# Patient Record
Sex: Male | Born: 1940 | Race: White | Hispanic: No | Marital: Married | Smoking: Former smoker
Health system: Southern US, Community
[De-identification: ages and names within clinical notes are randomized; demographics above are authoritative.]

## PROBLEM LIST (undated history)

## (undated) DIAGNOSIS — M1990 Unspecified osteoarthritis, unspecified site: Secondary | ICD-10-CM

## (undated) DIAGNOSIS — D496 Neoplasm of unspecified behavior of brain: Secondary | ICD-10-CM

## (undated) DIAGNOSIS — Z860100 Personal history of colon polyps, unspecified: Secondary | ICD-10-CM

## (undated) DIAGNOSIS — K449 Diaphragmatic hernia without obstruction or gangrene: Secondary | ICD-10-CM

## (undated) DIAGNOSIS — I639 Cerebral infarction, unspecified: Secondary | ICD-10-CM

## (undated) DIAGNOSIS — G20A1 Parkinson's disease without dyskinesia, without mention of fluctuations: Secondary | ICD-10-CM

## (undated) DIAGNOSIS — I4891 Unspecified atrial fibrillation: Secondary | ICD-10-CM

## (undated) DIAGNOSIS — G20 Parkinson's disease: Secondary | ICD-10-CM

## (undated) DIAGNOSIS — F419 Anxiety disorder, unspecified: Secondary | ICD-10-CM

## (undated) DIAGNOSIS — D329 Benign neoplasm of meninges, unspecified: Secondary | ICD-10-CM

## (undated) DIAGNOSIS — I82409 Acute embolism and thrombosis of unspecified deep veins of unspecified lower extremity: Secondary | ICD-10-CM

## (undated) DIAGNOSIS — F329 Major depressive disorder, single episode, unspecified: Secondary | ICD-10-CM

## (undated) DIAGNOSIS — I809 Phlebitis and thrombophlebitis of unspecified site: Secondary | ICD-10-CM

## (undated) DIAGNOSIS — K219 Gastro-esophageal reflux disease without esophagitis: Secondary | ICD-10-CM

## (undated) DIAGNOSIS — A630 Anogenital (venereal) warts: Secondary | ICD-10-CM

## (undated) DIAGNOSIS — J690 Pneumonitis due to inhalation of food and vomit: Secondary | ICD-10-CM

## (undated) DIAGNOSIS — I2699 Other pulmonary embolism without acute cor pulmonale: Secondary | ICD-10-CM

## (undated) DIAGNOSIS — I2510 Atherosclerotic heart disease of native coronary artery without angina pectoris: Secondary | ICD-10-CM

## (undated) DIAGNOSIS — N400 Enlarged prostate without lower urinary tract symptoms: Secondary | ICD-10-CM

## (undated) DIAGNOSIS — I340 Nonrheumatic mitral (valve) insufficiency: Secondary | ICD-10-CM

## (undated) DIAGNOSIS — I513 Intracardiac thrombosis, not elsewhere classified: Secondary | ICD-10-CM

## (undated) DIAGNOSIS — K222 Esophageal obstruction: Secondary | ICD-10-CM

## (undated) DIAGNOSIS — E785 Hyperlipidemia, unspecified: Secondary | ICD-10-CM

## (undated) DIAGNOSIS — F0390 Unspecified dementia without behavioral disturbance: Secondary | ICD-10-CM

## (undated) DIAGNOSIS — K5730 Diverticulosis of large intestine without perforation or abscess without bleeding: Secondary | ICD-10-CM

## (undated) DIAGNOSIS — F32A Depression, unspecified: Secondary | ICD-10-CM

## (undated) HISTORY — DX: Benign prostatic hyperplasia without lower urinary tract symptoms: N40.0

## (undated) HISTORY — DX: Esophageal obstruction: K22.2

## (undated) HISTORY — PX: VARICOSE VEIN SURGERY: SHX832

## (undated) HISTORY — DX: Unspecified dementia, unspecified severity, without behavioral disturbance, psychotic disturbance, mood disturbance, and anxiety: F03.90

## (undated) HISTORY — DX: Parkinson's disease: G20

## (undated) HISTORY — DX: Diverticulosis of large intestine without perforation or abscess without bleeding: K57.30

## (undated) HISTORY — DX: Phlebitis and thrombophlebitis of unspecified site: I80.9

## (undated) HISTORY — DX: Personal history of colon polyps, unspecified: Z86.0100

## (undated) HISTORY — DX: Diaphragmatic hernia without obstruction or gangrene: K44.9

## (undated) HISTORY — DX: Parkinson's disease without dyskinesia, without mention of fluctuations: G20.A1

## (undated) HISTORY — DX: Personal history of colonic polyps: Z86.010

## (undated) HISTORY — DX: Unspecified atrial fibrillation: I48.91

## (undated) HISTORY — DX: Depression, unspecified: F32.A

## (undated) HISTORY — DX: Acute embolism and thrombosis of unspecified deep veins of unspecified lower extremity: I82.409

## (undated) HISTORY — DX: Anogenital (venereal) warts: A63.0

## (undated) HISTORY — PX: UMBILICAL HERNIA REPAIR: SHX196

## (undated) HISTORY — DX: Benign neoplasm of meninges, unspecified: D32.9

## (undated) HISTORY — PX: BACK SURGERY: SHX140

## (undated) HISTORY — DX: Atherosclerotic heart disease of native coronary artery without angina pectoris: I25.10

## (undated) HISTORY — PX: LUMBAR DISC SURGERY: SHX700

## (undated) HISTORY — PX: MASTOIDECTOMY: SHX711

## (undated) HISTORY — PX: TONSILLECTOMY AND ADENOIDECTOMY: SUR1326

## (undated) HISTORY — DX: Other pulmonary embolism without acute cor pulmonale: I26.99

## (undated) HISTORY — DX: Gastro-esophageal reflux disease without esophagitis: K21.9

## (undated) HISTORY — DX: Major depressive disorder, single episode, unspecified: F32.9

## (undated) HISTORY — DX: Hyperlipidemia, unspecified: E78.5

## (undated) HISTORY — PX: JOINT REPLACEMENT: SHX530

## (undated) HISTORY — PX: INGUINAL HERNIA REPAIR: SUR1180

---

## 1974-10-12 ENCOUNTER — Encounter: Payer: Self-pay | Admitting: Cardiology

## 1974-10-13 ENCOUNTER — Encounter: Payer: Self-pay | Admitting: Cardiology

## 1997-06-18 ENCOUNTER — Ambulatory Visit (HOSPITAL_COMMUNITY): Admission: RE | Admit: 1997-06-18 | Discharge: 1997-06-18 | Payer: Self-pay | Admitting: Neurosurgery

## 1997-06-28 ENCOUNTER — Inpatient Hospital Stay (HOSPITAL_COMMUNITY): Admission: RE | Admit: 1997-06-28 | Discharge: 1997-06-29 | Payer: Self-pay | Admitting: Neurosurgery

## 1997-11-07 ENCOUNTER — Encounter: Payer: Self-pay | Admitting: Internal Medicine

## 1997-11-07 ENCOUNTER — Ambulatory Visit (HOSPITAL_COMMUNITY): Admission: RE | Admit: 1997-11-07 | Discharge: 1997-11-07 | Payer: Self-pay | Admitting: Internal Medicine

## 1998-01-31 ENCOUNTER — Other Ambulatory Visit: Admission: RE | Admit: 1998-01-31 | Discharge: 1998-01-31 | Payer: Self-pay | Admitting: Gastroenterology

## 1998-11-01 ENCOUNTER — Inpatient Hospital Stay (HOSPITAL_COMMUNITY): Admission: EM | Admit: 1998-11-01 | Discharge: 1998-11-03 | Payer: Self-pay | Admitting: *Deleted

## 1998-11-01 ENCOUNTER — Encounter: Payer: Self-pay | Admitting: *Deleted

## 1998-11-02 ENCOUNTER — Encounter: Payer: Self-pay | Admitting: Cardiology

## 1999-02-09 ENCOUNTER — Encounter: Payer: Self-pay | Admitting: Family Medicine

## 1999-02-09 ENCOUNTER — Encounter: Admission: RE | Admit: 1999-02-09 | Discharge: 1999-02-09 | Payer: Self-pay | Admitting: Family Medicine

## 1999-03-14 ENCOUNTER — Ambulatory Visit (HOSPITAL_COMMUNITY): Admission: RE | Admit: 1999-03-14 | Discharge: 1999-03-14 | Payer: Self-pay | Admitting: Gastroenterology

## 1999-03-14 ENCOUNTER — Encounter: Payer: Self-pay | Admitting: Gastroenterology

## 1999-07-24 ENCOUNTER — Encounter: Payer: Self-pay | Admitting: Emergency Medicine

## 1999-07-24 ENCOUNTER — Observation Stay (HOSPITAL_COMMUNITY): Admission: EM | Admit: 1999-07-24 | Discharge: 1999-07-24 | Payer: Self-pay | Admitting: Emergency Medicine

## 1999-07-31 ENCOUNTER — Inpatient Hospital Stay (HOSPITAL_COMMUNITY): Admission: EM | Admit: 1999-07-31 | Discharge: 1999-08-04 | Payer: Self-pay | Admitting: Emergency Medicine

## 1999-08-24 ENCOUNTER — Ambulatory Visit (HOSPITAL_BASED_OUTPATIENT_CLINIC_OR_DEPARTMENT_OTHER): Admission: RE | Admit: 1999-08-24 | Discharge: 1999-08-24 | Payer: Self-pay | Admitting: *Deleted

## 1999-09-02 ENCOUNTER — Encounter: Admission: RE | Admit: 1999-09-02 | Discharge: 1999-09-02 | Payer: Self-pay | Admitting: Family Medicine

## 1999-09-02 ENCOUNTER — Encounter: Payer: Self-pay | Admitting: Family Medicine

## 1999-11-17 ENCOUNTER — Other Ambulatory Visit: Admission: RE | Admit: 1999-11-17 | Discharge: 1999-11-17 | Payer: Self-pay | Admitting: Urology

## 1999-11-17 ENCOUNTER — Encounter (INDEPENDENT_AMBULATORY_CARE_PROVIDER_SITE_OTHER): Payer: Self-pay | Admitting: Specialist

## 2000-03-17 ENCOUNTER — Observation Stay (HOSPITAL_COMMUNITY): Admission: EM | Admit: 2000-03-17 | Discharge: 2000-03-18 | Payer: Self-pay | Admitting: *Deleted

## 2001-08-26 ENCOUNTER — Encounter: Payer: Self-pay | Admitting: Emergency Medicine

## 2001-08-26 ENCOUNTER — Emergency Department (HOSPITAL_COMMUNITY): Admission: EM | Admit: 2001-08-26 | Discharge: 2001-08-26 | Payer: Self-pay | Admitting: Emergency Medicine

## 2001-10-03 ENCOUNTER — Encounter: Payer: Self-pay | Admitting: Internal Medicine

## 2001-10-03 ENCOUNTER — Ambulatory Visit (HOSPITAL_COMMUNITY): Admission: RE | Admit: 2001-10-03 | Discharge: 2001-10-03 | Payer: Self-pay | Admitting: Internal Medicine

## 2001-10-29 ENCOUNTER — Inpatient Hospital Stay (HOSPITAL_COMMUNITY): Admission: EM | Admit: 2001-10-29 | Discharge: 2001-11-01 | Payer: Self-pay

## 2001-10-31 ENCOUNTER — Encounter: Payer: Self-pay | Admitting: Surgery

## 2001-12-07 ENCOUNTER — Encounter: Payer: Self-pay | Admitting: Family Medicine

## 2002-02-16 ENCOUNTER — Encounter: Payer: Self-pay | Admitting: Internal Medicine

## 2002-02-16 ENCOUNTER — Encounter: Admission: RE | Admit: 2002-02-16 | Discharge: 2002-02-16 | Payer: Self-pay | Admitting: Internal Medicine

## 2002-03-18 ENCOUNTER — Encounter: Payer: Self-pay | Admitting: Emergency Medicine

## 2002-03-18 ENCOUNTER — Emergency Department (HOSPITAL_COMMUNITY): Admission: EM | Admit: 2002-03-18 | Discharge: 2002-03-18 | Payer: Self-pay | Admitting: Emergency Medicine

## 2002-05-30 ENCOUNTER — Encounter: Admission: RE | Admit: 2002-05-30 | Discharge: 2002-05-30 | Payer: Self-pay | Admitting: Family Medicine

## 2002-05-30 ENCOUNTER — Encounter: Payer: Self-pay | Admitting: Family Medicine

## 2002-06-15 ENCOUNTER — Ambulatory Visit (HOSPITAL_BASED_OUTPATIENT_CLINIC_OR_DEPARTMENT_OTHER): Admission: RE | Admit: 2002-06-15 | Discharge: 2002-06-15 | Payer: Self-pay | Admitting: Family Medicine

## 2002-08-28 ENCOUNTER — Observation Stay (HOSPITAL_COMMUNITY): Admission: EM | Admit: 2002-08-28 | Discharge: 2002-08-29 | Payer: Self-pay | Admitting: Emergency Medicine

## 2002-08-28 ENCOUNTER — Encounter: Payer: Self-pay | Admitting: Emergency Medicine

## 2002-08-29 ENCOUNTER — Encounter: Payer: Self-pay | Admitting: Cardiology

## 2002-12-28 ENCOUNTER — Emergency Department (HOSPITAL_COMMUNITY): Admission: EM | Admit: 2002-12-28 | Discharge: 2002-12-28 | Payer: Self-pay | Admitting: Emergency Medicine

## 2003-03-15 ENCOUNTER — Ambulatory Visit (HOSPITAL_BASED_OUTPATIENT_CLINIC_OR_DEPARTMENT_OTHER): Admission: RE | Admit: 2003-03-15 | Discharge: 2003-03-15 | Payer: Self-pay | Admitting: Otolaryngology

## 2003-04-03 ENCOUNTER — Encounter: Admission: RE | Admit: 2003-04-03 | Discharge: 2003-04-18 | Payer: Self-pay | Admitting: Family Medicine

## 2003-05-24 ENCOUNTER — Ambulatory Visit (HOSPITAL_COMMUNITY): Admission: RE | Admit: 2003-05-24 | Discharge: 2003-05-24 | Payer: Self-pay | Admitting: Gastroenterology

## 2004-01-01 ENCOUNTER — Emergency Department (HOSPITAL_COMMUNITY): Admission: EM | Admit: 2004-01-01 | Discharge: 2004-01-02 | Payer: Self-pay | Admitting: Emergency Medicine

## 2004-01-07 ENCOUNTER — Ambulatory Visit: Payer: Self-pay | Admitting: Gastroenterology

## 2004-01-08 ENCOUNTER — Ambulatory Visit: Payer: Self-pay | Admitting: Gastroenterology

## 2004-01-09 ENCOUNTER — Ambulatory Visit: Payer: Self-pay | Admitting: Family Medicine

## 2004-02-13 ENCOUNTER — Ambulatory Visit: Payer: Self-pay | Admitting: Family Medicine

## 2004-03-02 ENCOUNTER — Ambulatory Visit: Payer: Self-pay | Admitting: Family Medicine

## 2004-05-04 ENCOUNTER — Ambulatory Visit: Payer: Self-pay | Admitting: Family Medicine

## 2004-07-03 ENCOUNTER — Ambulatory Visit: Payer: Self-pay | Admitting: Family Medicine

## 2004-07-14 ENCOUNTER — Ambulatory Visit: Payer: Self-pay | Admitting: Family Medicine

## 2004-08-03 ENCOUNTER — Ambulatory Visit: Payer: Self-pay | Admitting: Family Medicine

## 2004-08-23 ENCOUNTER — Emergency Department (HOSPITAL_COMMUNITY): Admission: EM | Admit: 2004-08-23 | Discharge: 2004-08-24 | Payer: Self-pay | Admitting: Emergency Medicine

## 2004-09-04 ENCOUNTER — Ambulatory Visit: Payer: Self-pay | Admitting: Family Medicine

## 2004-11-17 ENCOUNTER — Ambulatory Visit: Payer: Self-pay | Admitting: Family Medicine

## 2004-11-24 ENCOUNTER — Emergency Department (HOSPITAL_COMMUNITY): Admission: EM | Admit: 2004-11-24 | Discharge: 2004-11-24 | Payer: Self-pay | Admitting: Emergency Medicine

## 2004-12-07 ENCOUNTER — Ambulatory Visit: Payer: Self-pay | Admitting: Family Medicine

## 2005-05-06 ENCOUNTER — Ambulatory Visit: Payer: Self-pay | Admitting: Family Medicine

## 2005-07-07 ENCOUNTER — Ambulatory Visit: Payer: Self-pay | Admitting: Family Medicine

## 2005-09-06 ENCOUNTER — Ambulatory Visit: Payer: Self-pay | Admitting: Family Medicine

## 2005-10-19 ENCOUNTER — Ambulatory Visit: Payer: Self-pay | Admitting: Family Medicine

## 2005-11-17 ENCOUNTER — Emergency Department (HOSPITAL_COMMUNITY): Admission: EM | Admit: 2005-11-17 | Discharge: 2005-11-17 | Payer: Self-pay | Admitting: Hematology and Oncology

## 2005-11-17 ENCOUNTER — Ambulatory Visit: Payer: Self-pay | Admitting: *Deleted

## 2005-11-17 ENCOUNTER — Inpatient Hospital Stay (HOSPITAL_COMMUNITY): Admission: AD | Admit: 2005-11-17 | Discharge: 2005-11-22 | Payer: Self-pay | Admitting: *Deleted

## 2005-12-30 ENCOUNTER — Ambulatory Visit: Payer: Self-pay | Admitting: Family Medicine

## 2006-01-04 ENCOUNTER — Emergency Department (HOSPITAL_COMMUNITY): Admission: EM | Admit: 2006-01-04 | Discharge: 2006-01-04 | Payer: Self-pay | Admitting: Emergency Medicine

## 2006-11-16 DIAGNOSIS — I10 Essential (primary) hypertension: Secondary | ICD-10-CM | POA: Insufficient documentation

## 2006-11-16 DIAGNOSIS — K219 Gastro-esophageal reflux disease without esophagitis: Secondary | ICD-10-CM

## 2007-01-16 ENCOUNTER — Telehealth: Payer: Self-pay | Admitting: Family Medicine

## 2007-01-17 ENCOUNTER — Telehealth: Payer: Self-pay | Admitting: Family Medicine

## 2007-01-23 DIAGNOSIS — I82409 Acute embolism and thrombosis of unspecified deep veins of unspecified lower extremity: Secondary | ICD-10-CM

## 2007-01-23 DIAGNOSIS — I2699 Other pulmonary embolism without acute cor pulmonale: Secondary | ICD-10-CM

## 2007-01-23 HISTORY — DX: Acute embolism and thrombosis of unspecified deep veins of unspecified lower extremity: I82.409

## 2007-01-23 HISTORY — DX: Other pulmonary embolism without acute cor pulmonale: I26.99

## 2007-02-02 ENCOUNTER — Encounter: Payer: Self-pay | Admitting: Family Medicine

## 2007-02-13 ENCOUNTER — Telehealth: Payer: Self-pay | Admitting: Family Medicine

## 2007-02-21 ENCOUNTER — Encounter: Payer: Self-pay | Admitting: Family Medicine

## 2007-02-22 ENCOUNTER — Ambulatory Visit: Payer: Self-pay | Admitting: Cardiovascular Disease

## 2007-02-22 ENCOUNTER — Inpatient Hospital Stay (HOSPITAL_COMMUNITY): Admission: AD | Admit: 2007-02-22 | Discharge: 2007-02-28 | Payer: Self-pay | Admitting: Family Medicine

## 2007-02-22 ENCOUNTER — Ambulatory Visit: Payer: Self-pay | Admitting: Family Medicine

## 2007-02-22 ENCOUNTER — Ambulatory Visit: Payer: Self-pay

## 2007-02-22 ENCOUNTER — Encounter: Payer: Self-pay | Admitting: Family Medicine

## 2007-02-22 DIAGNOSIS — I809 Phlebitis and thrombophlebitis of unspecified site: Secondary | ICD-10-CM | POA: Insufficient documentation

## 2007-02-22 DIAGNOSIS — F068 Other specified mental disorders due to known physiological condition: Secondary | ICD-10-CM

## 2007-02-24 ENCOUNTER — Encounter: Payer: Self-pay | Admitting: Family Medicine

## 2007-02-24 ENCOUNTER — Encounter (INDEPENDENT_AMBULATORY_CARE_PROVIDER_SITE_OTHER): Payer: Self-pay | Admitting: Interventional Radiology

## 2007-02-24 ENCOUNTER — Encounter: Payer: Self-pay | Admitting: Internal Medicine

## 2007-03-01 ENCOUNTER — Encounter: Payer: Self-pay | Admitting: Family Medicine

## 2007-03-13 ENCOUNTER — Ambulatory Visit: Payer: Self-pay | Admitting: Hematology and Oncology

## 2007-03-13 ENCOUNTER — Ambulatory Visit: Payer: Self-pay | Admitting: Family Medicine

## 2007-03-13 DIAGNOSIS — Z86718 Personal history of other venous thrombosis and embolism: Secondary | ICD-10-CM

## 2007-03-13 DIAGNOSIS — A630 Anogenital (venereal) warts: Secondary | ICD-10-CM | POA: Insufficient documentation

## 2007-03-13 DIAGNOSIS — I82409 Acute embolism and thrombosis of unspecified deep veins of unspecified lower extremity: Secondary | ICD-10-CM | POA: Insufficient documentation

## 2007-03-13 DIAGNOSIS — I2699 Other pulmonary embolism without acute cor pulmonale: Secondary | ICD-10-CM

## 2007-03-13 DIAGNOSIS — J45909 Unspecified asthma, uncomplicated: Secondary | ICD-10-CM | POA: Insufficient documentation

## 2007-03-13 DIAGNOSIS — D320 Benign neoplasm of cerebral meninges: Secondary | ICD-10-CM

## 2007-03-13 DIAGNOSIS — Z860100 Personal history of colon polyps, unspecified: Secondary | ICD-10-CM | POA: Insufficient documentation

## 2007-03-13 DIAGNOSIS — J309 Allergic rhinitis, unspecified: Secondary | ICD-10-CM | POA: Insufficient documentation

## 2007-03-13 DIAGNOSIS — G20 Parkinson's disease: Secondary | ICD-10-CM

## 2007-03-13 DIAGNOSIS — Z9189 Other specified personal risk factors, not elsewhere classified: Secondary | ICD-10-CM | POA: Insufficient documentation

## 2007-03-13 DIAGNOSIS — Z87898 Personal history of other specified conditions: Secondary | ICD-10-CM | POA: Insufficient documentation

## 2007-03-13 DIAGNOSIS — F329 Major depressive disorder, single episode, unspecified: Secondary | ICD-10-CM | POA: Insufficient documentation

## 2007-03-14 ENCOUNTER — Encounter: Payer: Self-pay | Admitting: Family Medicine

## 2007-03-20 ENCOUNTER — Telehealth: Payer: Self-pay | Admitting: Internal Medicine

## 2007-03-24 ENCOUNTER — Encounter: Payer: Self-pay | Admitting: Family Medicine

## 2007-03-24 LAB — CBC WITH DIFFERENTIAL/PLATELET
Basophils Absolute: 0.1 10*3/uL (ref 0.0–0.1)
EOS%: 8.1 % — ABNORMAL HIGH (ref 0.0–7.0)
Eosinophils Absolute: 0.5 10*3/uL (ref 0.0–0.5)
HGB: 13.5 g/dL (ref 13.0–17.1)
LYMPH%: 25.7 % (ref 14.0–48.0)
MCH: 30.7 pg (ref 28.0–33.4)
MCV: 89.2 fL (ref 81.6–98.0)
MONO%: 9.6 % (ref 0.0–13.0)
Platelets: 167 10*3/uL (ref 145–400)
RDW: 11.1 % — ABNORMAL LOW (ref 11.2–14.6)

## 2007-03-24 LAB — PROTIME-INR: INR: 2.4 (ref 2.00–3.50)

## 2007-03-30 LAB — CARDIOLIPIN ANTIBODIES, IGG, IGM, IGA
Anticardiolipin IgA: 9 [APL'U] (ref <13)
Anticardiolipin IgG: <7 [GPL'U] (ref <11)
Anticardiolipin IgM: 20 [MPL'U] (ref <10)

## 2007-03-30 LAB — COMPREHENSIVE METABOLIC PANEL
AST: 13 U/L (ref 0–37)
Albumin: 3.9 g/dL (ref 3.5–5.2)
Alkaline Phosphatase: 104 U/L (ref 39–117)
Glucose, Bld: 96 mg/dL (ref 70–99)
Potassium: 4.1 mEq/L (ref 3.5–5.3)
Sodium: 142 mEq/L (ref 135–145)
Total Bilirubin: 0.7 mg/dL (ref 0.3–1.2)
Total Protein: 6.7 g/dL (ref 6.0–8.3)

## 2007-03-30 LAB — LUPUS ANTICOAGULANT PANEL: DRVVT 1:1 Mix: 40 secs (ref 36.1–47.0)

## 2007-03-30 LAB — BETA-2 GLYCOPROTEIN ANTIBODIES
Beta-2 Glyco I IgG: <4 U/mL (ref <20)
Beta-2-Glycoprotein I IgM: 4 U/mL (ref <10)

## 2007-03-30 LAB — PROTHROMBIN GENE MUTATION

## 2007-03-30 LAB — MTHFR DNA ANALYSIS

## 2007-04-04 ENCOUNTER — Encounter: Payer: Self-pay | Admitting: Family Medicine

## 2007-04-06 ENCOUNTER — Encounter: Payer: Self-pay | Admitting: Family Medicine

## 2007-04-18 ENCOUNTER — Telehealth (INDEPENDENT_AMBULATORY_CARE_PROVIDER_SITE_OTHER): Payer: Self-pay | Admitting: *Deleted

## 2007-07-10 ENCOUNTER — Telehealth: Payer: Self-pay | Admitting: Family Medicine

## 2007-07-11 ENCOUNTER — Ambulatory Visit: Payer: Self-pay | Admitting: Hematology and Oncology

## 2007-07-13 ENCOUNTER — Encounter: Payer: Self-pay | Admitting: Family Medicine

## 2007-07-13 ENCOUNTER — Telehealth: Payer: Self-pay | Admitting: Family Medicine

## 2007-07-14 ENCOUNTER — Ambulatory Visit: Payer: Self-pay | Admitting: Surgery

## 2007-07-14 ENCOUNTER — Ambulatory Visit: Payer: Self-pay | Admitting: Family Medicine

## 2007-07-14 ENCOUNTER — Encounter: Payer: Self-pay | Admitting: Hematology and Oncology

## 2007-07-14 ENCOUNTER — Ambulatory Visit: Admission: RE | Admit: 2007-07-14 | Discharge: 2007-07-14 | Payer: Self-pay | Admitting: Hematology and Oncology

## 2007-07-26 ENCOUNTER — Encounter: Payer: Self-pay | Admitting: Family Medicine

## 2007-07-31 ENCOUNTER — Ambulatory Visit: Payer: Self-pay | Admitting: Family Medicine

## 2007-07-31 LAB — CONVERTED CEMR LAB: INR: 2.8

## 2007-08-28 ENCOUNTER — Ambulatory Visit: Payer: Self-pay | Admitting: Family Medicine

## 2007-08-28 LAB — CONVERTED CEMR LAB
INR: 3.2
Prothrombin Time: 21.6 s

## 2007-09-07 ENCOUNTER — Encounter: Payer: Self-pay | Admitting: Emergency Medicine

## 2007-09-07 ENCOUNTER — Ambulatory Visit: Payer: Self-pay | Admitting: Internal Medicine

## 2007-09-07 ENCOUNTER — Inpatient Hospital Stay (HOSPITAL_COMMUNITY): Admission: AD | Admit: 2007-09-07 | Discharge: 2007-09-08 | Payer: Self-pay | Admitting: Internal Medicine

## 2007-09-08 ENCOUNTER — Encounter: Payer: Self-pay | Admitting: Family Medicine

## 2007-09-08 ENCOUNTER — Encounter: Payer: Self-pay | Admitting: Internal Medicine

## 2007-09-18 ENCOUNTER — Emergency Department (HOSPITAL_COMMUNITY): Admission: EM | Admit: 2007-09-18 | Discharge: 2007-09-18 | Payer: Self-pay | Admitting: Emergency Medicine

## 2007-09-26 ENCOUNTER — Inpatient Hospital Stay (HOSPITAL_COMMUNITY): Admission: EM | Admit: 2007-09-26 | Discharge: 2007-09-27 | Payer: Self-pay | Admitting: Emergency Medicine

## 2007-09-27 ENCOUNTER — Encounter: Payer: Self-pay | Admitting: Family Medicine

## 2007-09-29 ENCOUNTER — Ambulatory Visit: Payer: Self-pay | Admitting: Family Medicine

## 2007-09-29 DIAGNOSIS — I4891 Unspecified atrial fibrillation: Secondary | ICD-10-CM

## 2007-10-27 ENCOUNTER — Ambulatory Visit: Payer: Self-pay | Admitting: Family Medicine

## 2007-10-27 LAB — CONVERTED CEMR LAB: INR: 1.7

## 2007-11-17 ENCOUNTER — Ambulatory Visit: Payer: Self-pay | Admitting: Family Medicine

## 2007-11-17 LAB — CONVERTED CEMR LAB: INR: 2

## 2007-11-21 ENCOUNTER — Encounter: Payer: Self-pay | Admitting: Family Medicine

## 2007-11-22 ENCOUNTER — Ambulatory Visit: Payer: Self-pay | Admitting: Cardiology

## 2008-03-25 DIAGNOSIS — J690 Pneumonitis due to inhalation of food and vomit: Secondary | ICD-10-CM

## 2008-03-25 HISTORY — DX: Pneumonitis due to inhalation of food and vomit: J69.0

## 2008-04-07 ENCOUNTER — Emergency Department (HOSPITAL_BASED_OUTPATIENT_CLINIC_OR_DEPARTMENT_OTHER): Admission: EM | Admit: 2008-04-07 | Discharge: 2008-04-07 | Payer: Self-pay | Admitting: Emergency Medicine

## 2008-04-08 ENCOUNTER — Ambulatory Visit: Payer: Self-pay | Admitting: Internal Medicine

## 2008-04-08 ENCOUNTER — Encounter: Payer: Self-pay | Admitting: Emergency Medicine

## 2008-04-08 ENCOUNTER — Inpatient Hospital Stay (HOSPITAL_COMMUNITY): Admission: AD | Admit: 2008-04-08 | Discharge: 2008-04-24 | Payer: Self-pay | Admitting: Internal Medicine

## 2008-04-08 ENCOUNTER — Ambulatory Visit: Payer: Self-pay | Admitting: Diagnostic Radiology

## 2008-04-18 ENCOUNTER — Telehealth: Payer: Self-pay | Admitting: Family Medicine

## 2008-04-22 ENCOUNTER — Telehealth: Payer: Self-pay | Admitting: Internal Medicine

## 2008-04-23 ENCOUNTER — Encounter: Payer: Self-pay | Admitting: Family Medicine

## 2008-04-24 ENCOUNTER — Telehealth: Payer: Self-pay | Admitting: Family Medicine

## 2008-04-25 ENCOUNTER — Telehealth: Payer: Self-pay | Admitting: Internal Medicine

## 2008-04-25 ENCOUNTER — Encounter: Payer: Self-pay | Admitting: Family Medicine

## 2008-04-30 ENCOUNTER — Telehealth: Payer: Self-pay | Admitting: Family Medicine

## 2008-05-06 ENCOUNTER — Encounter: Payer: Self-pay | Admitting: Family Medicine

## 2008-05-07 ENCOUNTER — Ambulatory Visit: Payer: Self-pay | Admitting: Internal Medicine

## 2008-05-07 ENCOUNTER — Telehealth: Payer: Self-pay | Admitting: Family Medicine

## 2008-05-07 ENCOUNTER — Inpatient Hospital Stay (HOSPITAL_COMMUNITY): Admission: EM | Admit: 2008-05-07 | Discharge: 2008-05-08 | Payer: Self-pay | Admitting: Emergency Medicine

## 2008-05-08 ENCOUNTER — Ambulatory Visit: Payer: Self-pay | Admitting: Family Medicine

## 2008-05-09 ENCOUNTER — Telehealth: Payer: Self-pay | Admitting: Family Medicine

## 2008-05-15 ENCOUNTER — Telehealth: Payer: Self-pay | Admitting: Family Medicine

## 2008-05-17 ENCOUNTER — Encounter: Payer: Self-pay | Admitting: Family Medicine

## 2008-05-21 ENCOUNTER — Telehealth: Payer: Self-pay | Admitting: Family Medicine

## 2008-05-22 ENCOUNTER — Ambulatory Visit: Payer: Self-pay | Admitting: Family Medicine

## 2008-05-28 ENCOUNTER — Telehealth: Payer: Self-pay | Admitting: Family Medicine

## 2008-06-05 ENCOUNTER — Encounter: Payer: Self-pay | Admitting: Family Medicine

## 2008-06-06 ENCOUNTER — Encounter: Payer: Self-pay | Admitting: Family Medicine

## 2008-06-10 ENCOUNTER — Telehealth: Payer: Self-pay | Admitting: Family Medicine

## 2008-06-10 ENCOUNTER — Telehealth (INDEPENDENT_AMBULATORY_CARE_PROVIDER_SITE_OTHER): Payer: Self-pay | Admitting: *Deleted

## 2008-06-11 ENCOUNTER — Telehealth (INDEPENDENT_AMBULATORY_CARE_PROVIDER_SITE_OTHER): Payer: Self-pay | Admitting: *Deleted

## 2008-06-11 ENCOUNTER — Telehealth: Payer: Self-pay | Admitting: Family Medicine

## 2008-06-12 ENCOUNTER — Encounter: Payer: Self-pay | Admitting: Internal Medicine

## 2008-06-12 ENCOUNTER — Ambulatory Visit: Payer: Self-pay | Admitting: Internal Medicine

## 2008-06-12 DIAGNOSIS — R1319 Other dysphagia: Secondary | ICD-10-CM

## 2008-06-13 ENCOUNTER — Telehealth: Payer: Self-pay | Admitting: Internal Medicine

## 2008-06-13 DIAGNOSIS — I5040 Unspecified combined systolic (congestive) and diastolic (congestive) heart failure: Secondary | ICD-10-CM | POA: Insufficient documentation

## 2008-06-14 ENCOUNTER — Encounter: Payer: Self-pay | Admitting: Internal Medicine

## 2008-06-17 ENCOUNTER — Telehealth: Payer: Self-pay | Admitting: Family Medicine

## 2008-06-18 ENCOUNTER — Encounter: Payer: Self-pay | Admitting: Internal Medicine

## 2008-06-18 ENCOUNTER — Telehealth: Payer: Self-pay | Admitting: Family Medicine

## 2008-06-20 ENCOUNTER — Encounter: Payer: Self-pay | Admitting: Family Medicine

## 2008-06-26 ENCOUNTER — Telehealth: Payer: Self-pay | Admitting: Family Medicine

## 2008-06-27 ENCOUNTER — Encounter: Payer: Self-pay | Admitting: Family Medicine

## 2008-07-03 ENCOUNTER — Encounter: Payer: Self-pay | Admitting: Family Medicine

## 2008-07-04 ENCOUNTER — Encounter: Payer: Self-pay | Admitting: Family Medicine

## 2008-07-15 ENCOUNTER — Ambulatory Visit: Payer: Self-pay | Admitting: Internal Medicine

## 2008-07-18 ENCOUNTER — Encounter: Payer: Self-pay | Admitting: Family Medicine

## 2008-07-24 ENCOUNTER — Telehealth: Payer: Self-pay | Admitting: Family Medicine

## 2008-07-25 ENCOUNTER — Telehealth (INDEPENDENT_AMBULATORY_CARE_PROVIDER_SITE_OTHER): Payer: Self-pay | Admitting: *Deleted

## 2008-07-31 ENCOUNTER — Telehealth: Payer: Self-pay | Admitting: Family Medicine

## 2008-08-21 ENCOUNTER — Telehealth (INDEPENDENT_AMBULATORY_CARE_PROVIDER_SITE_OTHER): Payer: Self-pay | Admitting: *Deleted

## 2008-08-21 ENCOUNTER — Ambulatory Visit: Payer: Self-pay | Admitting: Family Medicine

## 2008-08-23 ENCOUNTER — Encounter: Payer: Self-pay | Admitting: Internal Medicine

## 2008-09-02 ENCOUNTER — Telehealth: Payer: Self-pay | Admitting: Internal Medicine

## 2008-09-04 ENCOUNTER — Encounter: Payer: Self-pay | Admitting: Internal Medicine

## 2008-09-09 ENCOUNTER — Encounter: Payer: Self-pay | Admitting: Family Medicine

## 2008-09-13 ENCOUNTER — Telehealth: Payer: Self-pay | Admitting: Family Medicine

## 2008-09-18 ENCOUNTER — Encounter: Payer: Self-pay | Admitting: Family Medicine

## 2008-09-20 ENCOUNTER — Encounter: Payer: Self-pay | Admitting: Family Medicine

## 2008-10-01 ENCOUNTER — Encounter: Payer: Self-pay | Admitting: Family Medicine

## 2008-10-03 ENCOUNTER — Ambulatory Visit: Payer: Self-pay | Admitting: Internal Medicine

## 2008-10-04 ENCOUNTER — Telehealth: Payer: Self-pay | Admitting: Family Medicine

## 2008-10-07 ENCOUNTER — Encounter: Payer: Self-pay | Admitting: Family Medicine

## 2008-10-08 ENCOUNTER — Inpatient Hospital Stay (HOSPITAL_COMMUNITY): Admission: EM | Admit: 2008-10-08 | Discharge: 2008-10-14 | Payer: Self-pay | Admitting: Emergency Medicine

## 2008-10-08 ENCOUNTER — Ambulatory Visit: Payer: Self-pay | Admitting: Internal Medicine

## 2008-10-08 ENCOUNTER — Ambulatory Visit: Payer: Self-pay | Admitting: Cardiology

## 2008-10-09 ENCOUNTER — Telehealth: Payer: Self-pay | Admitting: Family Medicine

## 2008-10-10 ENCOUNTER — Encounter (INDEPENDENT_AMBULATORY_CARE_PROVIDER_SITE_OTHER): Payer: Self-pay | Admitting: Internal Medicine

## 2008-10-15 ENCOUNTER — Encounter: Payer: Self-pay | Admitting: Family Medicine

## 2008-10-16 ENCOUNTER — Telehealth: Payer: Self-pay | Admitting: Family Medicine

## 2008-10-17 ENCOUNTER — Encounter: Payer: Self-pay | Admitting: Family Medicine

## 2008-10-22 ENCOUNTER — Telehealth: Payer: Self-pay | Admitting: Internal Medicine

## 2008-10-29 ENCOUNTER — Ambulatory Visit: Payer: Self-pay | Admitting: Family Medicine

## 2008-10-29 ENCOUNTER — Encounter: Payer: Self-pay | Admitting: Internal Medicine

## 2008-10-29 DIAGNOSIS — N390 Urinary tract infection, site not specified: Secondary | ICD-10-CM | POA: Insufficient documentation

## 2008-10-31 ENCOUNTER — Encounter: Payer: Self-pay | Admitting: Family Medicine

## 2008-11-01 ENCOUNTER — Emergency Department (HOSPITAL_BASED_OUTPATIENT_CLINIC_OR_DEPARTMENT_OTHER): Admission: EM | Admit: 2008-11-01 | Discharge: 2008-11-01 | Payer: Self-pay | Admitting: Emergency Medicine

## 2008-11-01 ENCOUNTER — Telehealth: Payer: Self-pay | Admitting: Family Medicine

## 2008-11-01 ENCOUNTER — Ambulatory Visit: Payer: Self-pay | Admitting: Diagnostic Radiology

## 2008-11-05 ENCOUNTER — Ambulatory Visit: Payer: Self-pay | Admitting: Family Medicine

## 2008-11-07 ENCOUNTER — Telehealth: Payer: Self-pay | Admitting: Family Medicine

## 2008-11-08 ENCOUNTER — Telehealth: Payer: Self-pay | Admitting: Family Medicine

## 2008-11-11 ENCOUNTER — Telehealth: Payer: Self-pay | Admitting: Family Medicine

## 2008-11-14 ENCOUNTER — Encounter: Payer: Self-pay | Admitting: Family Medicine

## 2008-11-19 ENCOUNTER — Encounter: Payer: Self-pay | Admitting: Family Medicine

## 2008-11-21 ENCOUNTER — Encounter: Payer: Self-pay | Admitting: Family Medicine

## 2008-11-27 ENCOUNTER — Ambulatory Visit: Payer: Self-pay | Admitting: Family Medicine

## 2008-12-02 ENCOUNTER — Encounter: Payer: Self-pay | Admitting: Physician Assistant

## 2008-12-02 ENCOUNTER — Ambulatory Visit: Payer: Self-pay | Admitting: Internal Medicine

## 2008-12-02 ENCOUNTER — Telehealth: Payer: Self-pay | Admitting: Family Medicine

## 2008-12-02 DIAGNOSIS — I951 Orthostatic hypotension: Secondary | ICD-10-CM | POA: Insufficient documentation

## 2008-12-02 LAB — CONVERTED CEMR LAB
Digitoxin Lvl: 0.8 ng/mL (ref 0.8–2.0)
GFR calc non Af Amer: 58.45 mL/min (ref >60)
Glucose, Bld: 85 mg/dL (ref 70–99)
Potassium: 5 meq/L (ref 3.5–5.1)
Sodium: 140 meq/L (ref 135–145)

## 2008-12-04 ENCOUNTER — Encounter: Payer: Self-pay | Admitting: Family Medicine

## 2008-12-06 ENCOUNTER — Encounter: Payer: Self-pay | Admitting: Family Medicine

## 2009-01-02 ENCOUNTER — Telehealth: Payer: Self-pay | Admitting: Internal Medicine

## 2009-01-07 ENCOUNTER — Encounter: Payer: Self-pay | Admitting: Internal Medicine

## 2009-01-21 ENCOUNTER — Encounter (INDEPENDENT_AMBULATORY_CARE_PROVIDER_SITE_OTHER): Payer: Self-pay | Admitting: *Deleted

## 2009-01-24 ENCOUNTER — Encounter (INDEPENDENT_AMBULATORY_CARE_PROVIDER_SITE_OTHER): Payer: Self-pay | Admitting: *Deleted

## 2009-02-07 ENCOUNTER — Telehealth: Payer: Self-pay | Admitting: Family Medicine

## 2009-02-26 ENCOUNTER — Ambulatory Visit: Payer: Self-pay | Admitting: Family Medicine

## 2009-03-28 ENCOUNTER — Encounter: Payer: Self-pay | Admitting: Internal Medicine

## 2009-03-31 ENCOUNTER — Ambulatory Visit: Payer: Self-pay | Admitting: Internal Medicine

## 2009-04-22 ENCOUNTER — Telehealth: Payer: Self-pay | Admitting: Family Medicine

## 2009-06-06 ENCOUNTER — Encounter: Payer: Self-pay | Admitting: Internal Medicine

## 2009-06-14 ENCOUNTER — Emergency Department (HOSPITAL_COMMUNITY): Admission: EM | Admit: 2009-06-14 | Discharge: 2009-06-14 | Payer: Self-pay | Admitting: Emergency Medicine

## 2009-06-18 ENCOUNTER — Telehealth: Payer: Self-pay | Admitting: Internal Medicine

## 2009-06-28 ENCOUNTER — Emergency Department (HOSPITAL_BASED_OUTPATIENT_CLINIC_OR_DEPARTMENT_OTHER): Admission: EM | Admit: 2009-06-28 | Discharge: 2009-06-28 | Payer: Self-pay | Admitting: Emergency Medicine

## 2009-06-28 ENCOUNTER — Ambulatory Visit: Payer: Self-pay | Admitting: Diagnostic Radiology

## 2009-07-16 ENCOUNTER — Encounter: Payer: Self-pay | Admitting: Family Medicine

## 2009-08-22 ENCOUNTER — Ambulatory Visit: Payer: Self-pay | Admitting: Family Medicine

## 2009-08-22 HISTORY — PX: MITRAL VALVE REPAIR: SHX2039

## 2009-08-22 HISTORY — PX: CORONARY ARTERY BYPASS GRAFT: SHX141

## 2009-08-25 ENCOUNTER — Emergency Department (HOSPITAL_COMMUNITY): Admission: EM | Admit: 2009-08-25 | Discharge: 2009-08-25 | Payer: Self-pay | Admitting: Emergency Medicine

## 2009-08-26 ENCOUNTER — Telehealth: Payer: Self-pay | Admitting: Internal Medicine

## 2009-08-27 ENCOUNTER — Inpatient Hospital Stay (HOSPITAL_COMMUNITY): Admission: EM | Admit: 2009-08-27 | Discharge: 2009-09-17 | Payer: Self-pay | Admitting: Emergency Medicine

## 2009-08-27 ENCOUNTER — Telehealth: Payer: Self-pay

## 2009-08-27 ENCOUNTER — Ambulatory Visit: Payer: Self-pay | Admitting: Cardiovascular Disease

## 2009-08-29 ENCOUNTER — Ambulatory Visit: Payer: Self-pay | Admitting: Cardiothoracic Surgery

## 2009-08-31 ENCOUNTER — Encounter: Payer: Self-pay | Admitting: Cardiothoracic Surgery

## 2009-09-01 ENCOUNTER — Encounter: Payer: Self-pay | Admitting: Internal Medicine

## 2009-09-01 ENCOUNTER — Encounter: Payer: Self-pay | Admitting: Cardiothoracic Surgery

## 2009-09-02 ENCOUNTER — Encounter: Payer: Self-pay | Admitting: Internal Medicine

## 2009-09-03 ENCOUNTER — Encounter: Payer: Self-pay | Admitting: Internal Medicine

## 2009-09-04 ENCOUNTER — Encounter: Payer: Self-pay | Admitting: Internal Medicine

## 2009-09-05 ENCOUNTER — Encounter: Payer: Self-pay | Admitting: Cardiothoracic Surgery

## 2009-09-15 ENCOUNTER — Encounter: Payer: Self-pay | Admitting: Internal Medicine

## 2009-09-22 ENCOUNTER — Ambulatory Visit: Payer: Self-pay | Admitting: Cardiothoracic Surgery

## 2009-09-22 ENCOUNTER — Encounter: Admission: RE | Admit: 2009-09-22 | Discharge: 2009-09-22 | Payer: Self-pay | Admitting: Cardiothoracic Surgery

## 2009-09-22 ENCOUNTER — Telehealth: Payer: Self-pay | Admitting: Internal Medicine

## 2009-09-22 ENCOUNTER — Encounter: Payer: Self-pay | Admitting: Cardiology

## 2009-09-25 ENCOUNTER — Ambulatory Visit: Payer: Self-pay | Admitting: Internal Medicine

## 2009-09-25 DIAGNOSIS — I2510 Atherosclerotic heart disease of native coronary artery without angina pectoris: Secondary | ICD-10-CM | POA: Insufficient documentation

## 2009-09-26 ENCOUNTER — Encounter: Admission: RE | Admit: 2009-09-26 | Discharge: 2009-09-26 | Payer: Self-pay | Admitting: Cardiothoracic Surgery

## 2009-09-26 ENCOUNTER — Encounter: Payer: Self-pay | Admitting: Cardiothoracic Surgery

## 2009-09-26 ENCOUNTER — Ambulatory Visit: Payer: Self-pay | Admitting: Cardiothoracic Surgery

## 2009-09-26 ENCOUNTER — Ambulatory Visit: Admission: RE | Admit: 2009-09-26 | Discharge: 2009-09-26 | Payer: Self-pay | Admitting: Cardiothoracic Surgery

## 2009-09-26 ENCOUNTER — Ambulatory Visit: Payer: Self-pay | Admitting: Vascular Surgery

## 2009-09-27 ENCOUNTER — Emergency Department (HOSPITAL_COMMUNITY): Admission: EM | Admit: 2009-09-27 | Discharge: 2009-09-27 | Payer: Self-pay | Admitting: Emergency Medicine

## 2009-09-29 ENCOUNTER — Encounter: Payer: Self-pay | Admitting: Family Medicine

## 2009-10-03 ENCOUNTER — Ambulatory Visit: Payer: Self-pay | Admitting: Family Medicine

## 2009-10-08 ENCOUNTER — Encounter: Admission: RE | Admit: 2009-10-08 | Discharge: 2009-10-08 | Payer: Self-pay | Admitting: Cardiothoracic Surgery

## 2009-10-08 ENCOUNTER — Ambulatory Visit: Payer: Self-pay | Admitting: Cardiothoracic Surgery

## 2009-10-08 ENCOUNTER — Encounter: Payer: Self-pay | Admitting: Family Medicine

## 2009-10-10 ENCOUNTER — Ambulatory Visit: Payer: Self-pay | Admitting: Cardiothoracic Surgery

## 2009-10-11 ENCOUNTER — Ambulatory Visit: Payer: Self-pay | Admitting: Cardiothoracic Surgery

## 2009-10-11 ENCOUNTER — Inpatient Hospital Stay (HOSPITAL_COMMUNITY): Admission: EM | Admit: 2009-10-11 | Discharge: 2009-10-21 | Payer: Self-pay | Admitting: Cardiothoracic Surgery

## 2009-10-13 ENCOUNTER — Encounter: Payer: Self-pay | Admitting: Family Medicine

## 2009-10-15 ENCOUNTER — Encounter: Payer: Self-pay | Admitting: Family Medicine

## 2009-10-16 ENCOUNTER — Encounter: Payer: Self-pay | Admitting: Family Medicine

## 2009-10-20 ENCOUNTER — Encounter: Payer: Self-pay | Admitting: Family Medicine

## 2009-10-29 ENCOUNTER — Ambulatory Visit: Payer: Self-pay | Admitting: Cardiothoracic Surgery

## 2009-11-03 ENCOUNTER — Ambulatory Visit: Payer: Self-pay | Admitting: Cardiology

## 2009-11-03 DIAGNOSIS — Z9889 Other specified postprocedural states: Secondary | ICD-10-CM

## 2009-11-05 ENCOUNTER — Encounter: Payer: Self-pay | Admitting: Cardiology

## 2009-11-05 ENCOUNTER — Ambulatory Visit: Payer: Self-pay | Admitting: Cardiothoracic Surgery

## 2009-11-06 ENCOUNTER — Encounter: Payer: Self-pay | Admitting: Family Medicine

## 2009-11-11 ENCOUNTER — Telehealth: Payer: Self-pay | Admitting: Family Medicine

## 2009-11-12 ENCOUNTER — Encounter: Payer: Self-pay | Admitting: Family Medicine

## 2009-11-17 ENCOUNTER — Encounter: Payer: Self-pay | Admitting: Family Medicine

## 2009-11-19 ENCOUNTER — Encounter: Payer: Self-pay | Admitting: Family Medicine

## 2009-11-21 ENCOUNTER — Encounter: Payer: Self-pay | Admitting: Internal Medicine

## 2009-11-21 ENCOUNTER — Ambulatory Visit: Payer: Self-pay | Admitting: Cardiothoracic Surgery

## 2009-11-25 ENCOUNTER — Ambulatory Visit: Payer: Self-pay

## 2009-11-25 ENCOUNTER — Encounter: Payer: Self-pay | Admitting: Cardiothoracic Surgery

## 2009-11-25 ENCOUNTER — Encounter (INDEPENDENT_AMBULATORY_CARE_PROVIDER_SITE_OTHER): Payer: Self-pay | Admitting: *Deleted

## 2009-11-25 ENCOUNTER — Ambulatory Visit: Payer: Self-pay | Admitting: Cardiology

## 2009-11-25 ENCOUNTER — Ambulatory Visit (HOSPITAL_COMMUNITY): Admission: RE | Admit: 2009-11-25 | Discharge: 2009-11-25 | Payer: Self-pay | Admitting: Cardiothoracic Surgery

## 2009-11-26 ENCOUNTER — Encounter: Payer: Self-pay | Admitting: Family Medicine

## 2009-12-05 ENCOUNTER — Encounter: Payer: Self-pay | Admitting: Family Medicine

## 2009-12-16 ENCOUNTER — Encounter: Payer: Self-pay | Admitting: Family Medicine

## 2009-12-24 ENCOUNTER — Encounter: Payer: Self-pay | Admitting: Family Medicine

## 2010-01-20 ENCOUNTER — Telehealth: Payer: Self-pay | Admitting: Family Medicine

## 2010-01-28 ENCOUNTER — Ambulatory Visit: Payer: Self-pay | Admitting: Cardiothoracic Surgery

## 2010-01-30 ENCOUNTER — Telehealth: Payer: Self-pay | Admitting: Family Medicine

## 2010-02-03 ENCOUNTER — Telehealth: Payer: Self-pay | Admitting: Cardiology

## 2010-02-03 ENCOUNTER — Encounter: Payer: Self-pay | Admitting: Cardiology

## 2010-02-04 ENCOUNTER — Encounter: Payer: Self-pay | Admitting: Internal Medicine

## 2010-02-05 ENCOUNTER — Encounter: Payer: Self-pay | Admitting: Cardiology

## 2010-02-06 ENCOUNTER — Telehealth: Payer: Self-pay | Admitting: Family Medicine

## 2010-02-11 ENCOUNTER — Encounter: Payer: Self-pay | Admitting: Cardiology

## 2010-02-11 ENCOUNTER — Ambulatory Visit: Payer: Self-pay | Admitting: Cardiology

## 2010-02-11 ENCOUNTER — Telehealth: Payer: Self-pay | Admitting: Cardiology

## 2010-02-11 DIAGNOSIS — E785 Hyperlipidemia, unspecified: Secondary | ICD-10-CM | POA: Insufficient documentation

## 2010-02-25 ENCOUNTER — Encounter: Admit: 2010-02-25 | Payer: Self-pay | Admitting: Cardiology

## 2010-02-27 ENCOUNTER — Encounter
Admission: RE | Admit: 2010-02-27 | Discharge: 2010-03-23 | Payer: Self-pay | Source: Home / Self Care | Attending: Neurology | Admitting: Neurology

## 2010-03-05 ENCOUNTER — Telehealth: Payer: Self-pay | Admitting: Family Medicine

## 2010-03-15 ENCOUNTER — Encounter: Payer: Self-pay | Admitting: Emergency Medicine

## 2010-03-15 ENCOUNTER — Encounter: Payer: Self-pay | Admitting: Cardiothoracic Surgery

## 2010-03-15 ENCOUNTER — Encounter: Payer: Self-pay | Admitting: Gastroenterology

## 2010-03-22 LAB — CONVERTED CEMR LAB
INR: 1.2
Prothrombin Time: 13.8 s

## 2010-03-24 NOTE — Miscellaneous (Signed)
Summary: Supplemental Orders/[facility]  Supplemental Orders/[facility]   Imported By: Maryln Gottron 11/11/2009 14:51:20  _____________________________________________________________________  External Attachment:    Type:   Image     Comment:   External Document

## 2010-03-24 NOTE — Miscellaneous (Signed)
Summary: Missed Visit [facility]  Missed Visit [facility]   Imported By: Maryln Gottron 10/24/2009 09:12:03  _____________________________________________________________________  External Attachment:    Type:   Image     Comment:   External Document

## 2010-03-24 NOTE — Miscellaneous (Signed)
Summary: Supplemental Orders/[facility]  Supplemental Orders/[facility]   Imported By: Maryln Gottron 11/19/2009 15:44:22  _____________________________________________________________________  External Attachment:    Type:   Image     Comment:   External Document

## 2010-03-24 NOTE — Procedures (Signed)
Summary: Oximetry Report/[facility]  Oximetry Report/[facility]   Imported By: Maryln Gottron 12/03/2009 13:08:35  _____________________________________________________________________  External Attachment:    Type:   Image     Comment:   External Document

## 2010-03-24 NOTE — Letter (Signed)
Summary: [facility]  [facility]   Imported By: Maryln Gottron 10/20/2009 13:47:47  _____________________________________________________________________  External Attachment:    Type:   Image     Comment:   External Document

## 2010-03-24 NOTE — Miscellaneous (Signed)
Summary: Supplemental Orders/[facility]  Supplemental Orders/[facility]   Imported By: Maryln Gottron 12/01/2009 10:35:09  _____________________________________________________________________  External Attachment:    Type:   Image     Comment:   External Document

## 2010-03-24 NOTE — Miscellaneous (Signed)
Summary: Power of [employer] of Attorney   Imported By: Maryln Gottron 10/08/2009 15:05:23  _____________________________________________________________________  External Attachment:    Type:   Image     Comment:   External Document

## 2010-03-24 NOTE — Letter (Signed)
Summary: HOME HEALTH [facility] HEALTH REFERAL   Imported By: Georgian Co 12/16/2009 10:23:41  _____________________________________________________________________  External Attachment:    Type:   Image     Comment:   External Document

## 2010-03-24 NOTE — Progress Notes (Signed)
       New/Updated Medications: FLOMAX 0.4 MG CAPS (TAMSULOSIN HCL) 1 by mouth once daily Prescriptions: FLOMAX 0.4 MG CAPS (TAMSULOSIN HCL) 1 by mouth once daily  #30 x 4   Entered by:   Ami Bullins CMA   Authorized by:   Jacques Navy MD   Signed by:   Bill Salinas CMA on 06/18/2009   Method used:   Electronically to        [facility] (retail)       [address]       [place], [place]  [address]       Ph: [phone]       Fax: [phone]   RxID:   [id number]

## 2010-03-24 NOTE — Miscellaneous (Signed)
Summary: Supplemental Orders/[facility]  Supplemental Orders/[facility]   Imported By: Maryln Gottron 10/22/2009 13:45:20  _____________________________________________________________________  External Attachment:    Type:   Image     Comment:   External Document

## 2010-03-24 NOTE — Miscellaneous (Signed)
Summary: Supplemental Orders/[facility]  Supplemental Orders/[facility]   Imported By: Maryln Gottron 11/17/2009 10:40:22  _____________________________________________________________________  External Attachment:    Type:   Image     Comment:   External Document

## 2010-03-24 NOTE — Miscellaneous (Signed)
Summary: Missed Visit Documentation/[facility]  Missed Visit Documentation[facility]   Imported By: Maryln Gottron 12/16/2009 13:16:19  _____________________________________________________________________  External Attachment:    Type:   Image     Comment:   External Document

## 2010-03-24 NOTE — Letter (Signed)
Summary: [facility]  [facility]   Imported By: Maryln Gottron 11/11/2009 12:54:09  _____________________________________________________________________  External Attachment:    Type:   Image     Comment:   External Document

## 2010-03-24 NOTE — Letter (Signed)
Summary: [facility] Office Visit   [facility] Office Visit   Imported By: Roderic Ovens 12/09/2009 11:14:16  _____________________________________________________________________  External Attachment:    Type:   Image     Comment:   External Document

## 2010-03-24 NOTE — Letter (Signed)
Summary: Medical Necessity for Oximetry Testing/[facility]  Medical Necessity for Oximetry Testing/[facility]   Imported By: Maryln Gottron 11/20/2009 10:43:53  _____________________________________________________________________  External Attachment:    Type:   Image     Comment:   External Document

## 2010-03-24 NOTE — Progress Notes (Signed)
Summary: APPT  Phone Note Call from Patient Call back at Home Phone [phone]   Caller: Spouse Reason for Call: Talk to Nurse Summary of Call: was seen in er yesterday for chest tightness and dizziness, request to be seen before appt in August Initial call taken by: Migdalia Dk,  August 26, 2009 1:02 PM  Follow-up for Phone Call        I spoke with the pt's wife and at this time Dr Ladona Ridgel does not have any earlier appts.  Per Asher Muir in scheduling Dr Gala Romney said to have the pt see Dr Ladona Ridgel within 2-4 weeks.  The pt already has a pending appt on 09/25/09. I made the pt's wife aware that we would contact them for an earlier appt if Dr Ladona Ridgel had any cancellations. She agreed with plan. I will forward this message to [facility] in scheduling to contact the pt with an earlier appt if one becomes available.  Follow-up by: Julieta Gutting, RN, BSN,  August 26, 2009 3:32 PM     Appended Document: APPT LM for pt to come in on 7/7 to see Dr Ladona Ridgel @ 9:30. waiting for a call back to confirm.

## 2010-03-24 NOTE — Miscellaneous (Signed)
Summary: Certification and Plan of Care/[facility]  Certification and Plan of Care/[facility]   Imported By: Maryln Gottron 12/01/2009 10:33:45  _____________________________________________________________________  External Attachment:    Type:   Image     Comment:   External Document

## 2010-03-24 NOTE — Procedures (Signed)
Summary: Oximetry Report/[facility]  Oximetry Report/[facility]   Imported By: Maryln Gottron 12/09/2009 14:01:47  _____________________________________________________________________  External Attachment:    Type:   Image     Comment:   External Document

## 2010-03-24 NOTE — Progress Notes (Signed)
Summary: rx pantoprazole 40 mg   Phone Note From Pharmacy   [facility]er: [facility]* Summary of Call: refill pantoprazole 40 mg  Initial call taken by: Pura Spice, RN,  January 20, 2010 8:22 AM  Follow-up for Phone Call        done  needs ov  Follow-up by: Pura Spice, RN,  January 20, 2010 8:23 AM    New/Updated Medications: PANTOPRAZOLE SODIUM 40 MG TBEC (PANTOPRAZOLE SODIUM) 1 two times a day needs office viist Prescriptions: PANTOPRAZOLE SODIUM 40 MG TBEC (PANTOPRAZOLE SODIUM) 1 two times a day needs office viist  #60 x 4   Entered by:   Pura Spice, RN   Authorized by:   Nelwyn Salisbury MD   Signed by:   Pura Spice, RN on 01/20/2010   Method used:   Electronically to        [facility] (retail)       [address]       [place], [place]  [address]       Ph: [phone]       Fax: [phone]   RxID:   [id number]

## 2010-03-24 NOTE — Progress Notes (Signed)
Summary: refill hydroxyzine  Phone Note From Pharmacy   Caller: [facility]* Call For: [name]  Summary of Call: refill hydroxyzine 10mg  1 by mouth every 6 hours Initial call taken by: Alfred Levins, CMA,  April 22, 2009 8:29 AM  Follow-up for Phone Call        call in #60 with 11 rf Follow-up by: Nelwyn Salisbury MD,  April 22, 2009 8:42 AM  Additional Follow-up for Phone Call Additional follow up Details #1::        Phone call completed, Pharmacist called Additional Follow-up by: Alfred Levins, CMA,  April 22, 2009 8:45 AM    Prescriptions: HYDROXYZINE HCL 10 MG TABS (HYDROXYZINE HCL) 1 by mouth every 6 hours as needed  #60 x 11   Entered by:   Alfred Levins, CMA   Authorized by:   Nelwyn Salisbury MD   Signed by:   Alfred Levins, CMA on 04/22/2009   Method used:   Electronically to        [facility] (retail)       [address]       [place], [place]  [address]       Ph: [phone]       Fax: [phone]   RxID:   [id number]

## 2010-03-24 NOTE — Assessment & Plan Note (Signed)
Summary: np6/post cabg per Rosann Auerbach call/lg   Primary Provider:  Dr. Shellia Carwin   History of Present Illness: 70 year old male with past medical history of coronary artery disease and atrial fibrillation for followup. Patient admitted in July of 2011 with chest pain. Cardiac catheterization revealed an ejection fraction of 55-60% and significant mitral regurgitation. The right coronary artery had no significant stenosis. There was a 40% circumflex. The proximal portion of the left anterior descending artery has about 90% narrowing. Echocardiogram showed normal LV function and severe mitral regurgitation. On July 12 of 2011 the patient underwent  two-vessel coronary artery bypass grafting (left internal mammary artery to LAD, saphenous vein graft to obtuse marginal) and MV repair with a 22-mm Edwards ring and oversewing of left atrial appendage. Postoperative echocardiogram in July of 2011 revealed an ejection fraction of 45% and trivial mitral regurgitation. Postoperative course complicated by cellulitis. Since his last hospitalization for cellulitis there is no dyspnea, chest pain. He is on Lasix for volume excess. He also continues to have difficulties with orthostatic hypotension. His wife states that has been a problem for greater than one year. He feels dizzy when changing positions. He also has had recurrent syncopal episodes each time after standing suddenly. He had one this morning while coming into the building. He got out of his car and as he started walking to the door developed dizziness and frank syncope. There was no trauma. There is no associated chest pain, shortness of breath, nausea, vomiting, palpitations. There is no other neurological symptoms.  Current Medications (verified): 1)  Trazodone Hcl 150 Mg Tabs (Trazodone Hcl) .Marland Kitchen.. 1 Tab By Mouth At Bedtime 2)  Lorazepam 0.5 Mg Tabs (Lorazepam) .... As Needed 3)  Promethazine Hcl 25 Mg Tabs (Promethazine Hcl) .... As Needed 4)  Restoril 15  Mg Caps (Temazepam) .... 3 By Mouth At Bedtime 5)  Sertraline Hcl 50 Mg Tabs (Sertraline Hcl) .... Once Daily 6)  Carbidopa-Levodopa 25-100 Mg Tbdp (Carbidopa-Levodopa) .Marland Kitchen.. 1 Tab Two Times A Day 7)  Chlorpromazine Hcl 25 Mg Tabs (Chlorpromazine Hcl) .... Two Times A Day As Needed Hiccups 8)  Aspirin 81 Mg  Tabs (Aspirin) .Marland Kitchen.. 1 Tab By Mouth Once Daily 9)  Miralax   Powd (Polyethylene Glycol 3350) .Marland Kitchen.. 17g By Mouth Once Daily As Needed Constipation 10)  Levothyroxine Sodium 25 Mcg  Tabs (Levothyroxine Sodium) .Marland Kitchen.. 1 By Mouth Daily 11)  Flomax 0.4 Mg Caps (Tamsulosin Hcl) .Marland Kitchen.. 1 By Mouth Once Daily 12)  Pantoprazole Sodium 40 Mg Tbec (Pantoprazole Sodium) .Marland Kitchen.. 1 Two Times A Day 13)  Doxycycline Hyclate 100 Mg Solr (Doxycycline Hyclate) .Marland Kitchen.. 1 Tab By Mouth Two Times A Day 14)  Furosemide 40 Mg Tabs (Furosemide) .... Take One Tablet By Mouth Daily. 15)  Potassium Chloride Crys Cr 20 Meq Cr-Tabs (Potassium Chloride Crys Cr) .Marland Kitchen.. 1 Tab By Mouth Once Daily 16)  Tramadol Hcl 50 Mg Tabs (Tramadol Hcl) .... As Needed 17)  Multivitamins   Tabs (Multiple Vitamin) .Marland Kitchen.. 1 Tab By Mouth Once Daily  Allergies: 1)  ! Motrin Ib (Ibuprofen)  Past History:  Past Medical History: HYPERTENSION (ICD-401.9) ATRIAL FIBRILLATION (ICD-427.31) sees Dr. 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 Orpha Bur in [place] ASTHMA (ICD-493.90) CHICKENPOX, HX OF (ICD-V15.9) THROMBOPHLEBITIS (ICD-451.9) DEMENTIA (ICD-294.8) GERD (ICD-530.81) OTHER DYSPHAGIA (UJW-119.14) PARKINSON'S DISEASE (ICD-332.0), sees Dr. Vickey Huger tinnitis, sees Dr. Haroldine Laws  Past Surgical History:  Reviewed history from 03/13/2007 and no changes required. Tonsillectomy Umbilical hernia repaired Lt inguinal hernia repair Repair perforation to rt  tympanic membrane x2 Varicose vein stripping to lt leg Herinated disc & remove bone spurs colonoscopy 2003  Social History: Reviewed history from 03/13/2007 and no changes required. Married Never Smoked Alcohol use-no Drug use-no  Review of Systems       Problems with recurrent orthostatic syncope but no fevers or chills, productive cough, hemoptysis, dysphasia, odynophagia, melena, hematochezia, dysuria, hematuria, rash, seizure activity, orthopnea, PND, pedal edema, claudication. Remaining systems are negative.   Vital Signs:  Patient profile:   70 year old male Height:      74 inches Weight:      221 pounds BMI:     28.48 Pulse rate:   80 / minute Resp:     14 per minute BP sitting:   144 / 75  (left arm)  Vitals Entered By: Kem Parkinson (November 03, 2009 10:15 AM)  Serial Vital Signs/Assessments:  Time      Position  BP       Pulse  Resp  Temp     By           Lying LA  95/70    80                    Kimalexis Barnes           Sitting   77/58    77                    Kimalexis Barnes   Physical Exam  General:  Well-developed well-nourished in no acute distress.  Skin is warm and dry.  HEENT is normal.  Neck is supple. No thyromegaly.  Chest is clear to auscultation with normal expansion.  Cardiovascular exam is regular rate and rhythm. No mitral regurgitation murmur. Abdominal exam nontender or distended. No masses palpated. Extremities show no edema. neuro grossly intact    EKG  Procedure date:  11/03/2009  Findings:      Normal sinus rhythm at a rate of 84. No significant ST changes. Prolonged QT.  Impression & Recommendations:  Problem # 1:  ORTHOSTATIC HYPOTENSION (ICD-458.0) Patient is having significant problems with orthostatic hypotension. This has been going on for over one year. He is having syncopal episodes. I think it is most likely related to his Parkinson's. I will discontinue his Lasix and potassium. If he develops increased edema  we will resume at lower dose. I've asked him to discuss alternatives for Flomax with his urologist. I have asked him to discuss alternatives to trazodone and sertraline with his psychiatrist. He will see neurologist back to discuss further therapies for orthostasis. I've asked him to stay well hydrated and given him a prescription for Jobst stockings. I will consider Florinef in the future. Note Midodrine is usually not successful in Parkinson's patients.  Problem # 2:  CAD (ICD-414.00) Continue aspirin. Add Crestor 10 mg p.o. daily. Check lipids and liver in 6 weeks. The following medications were removed from the medication list:    Cardizem 30 Mg Tabs (Diltiazem hcl) .Marland Kitchen... 1 by mouth two times a day His updated medication list for this problem includes:    Aspirin 81 Mg Tabs (Aspirin) .Marland Kitchen... 1 tab by mouth once daily  Orders: T-Hepatic Function [id number]) T-Lipid Profile [phone])  Problem # 3:  HYPERTENSION (ICD-401.9) Will avoid antihypertensive medications given orthostatic hypotension. We may be forced to accept  supine hypertension to avoid orthostatic hypotension. The following medications were removed from the medication list:    Cardizem 30 Mg Tabs (Diltiazem hcl) .Marland Kitchen... 1 by mouth two times a day His updated medication list for this problem includes:    Aspirin 81 Mg Tabs (Aspirin) .Marland Kitchen... 1 tab by mouth once daily  Problem # 4:  PULMONARY EMBOLISM, HX OF (ICD-V12.51)  His updated medication list for this problem includes:    Aspirin 81 Mg Tabs (Aspirin) .Marland Kitchen... 1 tab by mouth once daily  Problem # 5:  PARKINSON'S DISEASE (ICD-332.0) Management per neurology.  Problem # 6:  MITRAL VALVE REPLACEMENT, HX OF (ICD-V15.1) Patient with history of mitral valve repair. Continued SBE prophylaxis. Followup echocardiograms in the future.  Patient Instructions: 1)  Your physician recommends that you schedule a follow-up appointment in: 3 MONTHS IN [place] 2)  Your physician  recommends that you return for lab work in:6 WEEKS 3)  Your physician has recommended you make the following change in your medication: START CRESTOR 10MG  ONCE DAILY 4)  WEAR SUPPORT HOSE DAILY Prescriptions: CRESTOR 10 MG TABS (ROSUVASTATIN CALCIUM) Take one tablet by mouth daily.  #30 x 12   Entered by:   Deliah Goody, RN   Authorized by:   Ferman Hamming, MD, [facility]   Signed by:   Deliah Goody, RN on 11/03/2009   Method used:   Electronically to        [facility] (retail)       [address]       [place], [place]  [address]       Ph: [phone]       Fax: [phone]   RxID:   [id number]

## 2010-03-24 NOTE — Miscellaneous (Signed)
Summary: Supplemental Orders/[facility]  Supplemental Orders/[facility]   Imported By: Maryln Gottron 10/24/2009 09:09:39  _____________________________________________________________________  External Attachment:    Type:   Image     Comment:   External Document

## 2010-03-24 NOTE — Progress Notes (Signed)
Summary: GINA PLEASE CALL  Phone Note Other Incoming   Caller: DEBRA/HOME HEALTH NURSE [phone] Summary of Call: PT HAS WOUND ON RIGHT LEG. DEBRA WOULD LIKE GINA TO RETURN CALL Initial call taken by: Heron Sabins,  November 11, 2009 2:56 PM  Follow-up for Phone Call        called office and cell number called at [phone]  left mess to return call  Follow-up by: Pura Spice, RN,  November 11, 2009 3:32 PM  Additional Follow-up for Phone Call Additional follow up Details #1::        I callede her again on her cell phone  and left message  Additional Follow-up by: Pura Spice, RN,  November 11, 2009 5:06 PM    Additional Follow-up for Phone Call Additional follow up Details #2::    notifeid debbie will  be sending fax report on leg wound some drainage , no prulent drainage she states did change dressing from wet to dry to aquasol  fax repport pending  Dr [name] informed. Follow-up by: Pura Spice, RN,  November 12, 2009 8:50 AM

## 2010-03-24 NOTE — Assessment & Plan Note (Signed)
Summary: eph/lwb   Visit Type:  Follow-up Primary Provider:  Dr. Shellia Carwin   History of Present Illness: Mr. Bosher returns today for followup of his atrial fib, pulmonary HTN, CHF, and CAD s/p CABG.  He was seen by me several weeks ago and at that time c/o continued chronic dyspnea.  He subsequently developed worsening and chest pain and was admitted and underwent catheterization where he was found to have severe CAD and underwent CABG.   He has maintained NSR but his amiodarone was stopped post op.   He does still have problems with dyspnea but denies c/p.  He has not had syncope.  He and his wife are upset thinking that his CAD should have been picked up sooner despite his not complaining of c/p until presenting to the hospital.  Allergies: 1)  ! Motrin Ib (Ibuprofen)  Past History:  Past Medical History: Last updated: 02/26/2009 HYPERTENSION (ICD-401.9) ATRIAL FIBRILLATION (ICD-427.31) sees Dr. 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 Orpha Bur in [place] ASTHMA (ICD-493.90) ALLERGIC RHINITIS (ICD-477.9) CHICKENPOX, HX OF (ICD-V15.9) THROMBOPHLEBITIS (ICD-451.9) DEMENTIA (ICD-294.8) GERD (ICD-530.81) OTHER DYSPHAGIA (ICD-787.29) PARKINSON'S DISEASE (ICD-332.0), sees Dr. Vickey Huger tinnitis, sees Dr. Haroldine Laws  Past Surgical History: Last updated: 03/13/2007 Tonsillectomy Umbilical hernia repaired Lt inguinal hernia repair Repair perforation to rt tympanic membrane x2 Varicose vein stripping to lt leg Herinated disc & remove bone spurs colonoscopy 2003  Review of Systems  The patient denies chest pain, syncope, dyspnea on exertion, and peripheral edema.    Vital Signs:  Patient  profile:   70 year old male Height:      74 inches Weight:      221 pounds BMI:     28.48 Temp:     96.9 degrees F oral BP sitting:   89 / 89  (left arm)  Vitals Entered By: Laurance Flatten CMA (September 25, 2009 2:40 PM)  Physical Exam  General:  Well-developed,well-nourished,in no acute distress; alert,appropriate and cooperative throughout examination. Masked facies. Head:  normocephalic and atraumatic Eyes:  PERRLA/EOM intact; conjunctiva and lids normal. Mouth:  Teeth, gums and palate normal. Oral mucosa normal. Neck:  No deformities, masses, or tenderness noted. Lungs:  Normal respiratory effort, chest expands symmetrically. Lungs are clear to auscultation, no crackles or wheezes. Heart:  normal rate, regular rhythm, no gallop, no rub, no JVD, and no HJR.  Has a 2/6 SM loudest at the mitral area Abdomen:  Bowel sounds positive,abdomen soft and non-tender without masses, organomegaly or hernias noted. Msk:  Back normal, normal gait. Muscle strength and tone normal. Pulses:  pulses normal in all 4 extremities Extremities:  No clubbing or cyanosis. Neurologic:  alert & oriented X3.     EKG  Procedure date:  09/25/2009  Findings:      Normal sinus rhythm with rate of: 84.  Left axis deviation.    Impression & Recommendations:  Problem # 1:  ATRIAL FIBRILLATION (ICD-427.31) His symptoms appear to be well controlled.  He will continue his current meds and followup with me in several months.  If he chooses a different followup, then he is instructed to call and we can arrange to have him see another cardiologist. His updated medication list for this problem includes:    Amiodarone Hcl 200 Mg Tabs (Amiodarone hcl) .Marland Kitchen... Take one tablet by mouth  daily    Aspirin 325 Mg Tabs (Aspirin) .Marland Kitchen... 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  A low sodium diet is recommended. His updaed  medication list for this problem includes:    Cardizem 30 Mg Tabs (Diltiazem hcl) .Marland Kitchen... 1 by mouth two times a day    Amiodarone Hcl 200 Mg Tabs (Amiodarone hcl) .Marland Kitchen... Take one tablet by mouth daily    Aspirin 325 Mg Tabs (Aspirin) .Marland Kitchen... Take 1 tab by mouth every day  Problem # 3:  CAD (ICD-414.00) He denies recurrent anginal symptoms and has healed up nicely since his CABG several weeks ago. His updated medication list for this problem includes:    Cardizem 30 Mg Tabs (Diltiazem hcl) .Marland Kitchen... 1 by mouth two times a day    Aspirin 325 Mg Tabs (Aspirin) .Marland Kitchen... Take 1 tab by mouth every day  Patient Instructions: 1)  Patient is going to call us back and let us know what doctor he is going to be following up with. 2)  Dr Ladona Ridgel told him he would be glad to call any doctor for him if he needed a referal

## 2010-03-24 NOTE — Letter (Signed)
Summary: [facility] Office Note  [facility] Office Note   Imported By: Roderic Ovens 07/08/2009 16:12:02  _____________________________________________________________________  External Attachment:    Type:   Image     Comment:   External Document

## 2010-03-24 NOTE — Letter (Signed)
Summary: [facility] Office Visit   [facility] Office Visit   Imported By: Roderic Ovens 11/07/2009 16:14:28  _____________________________________________________________________  External Attachment:    Type:   Image     Comment:   External Document  Appended Document: [facility] Office Visit  Patient being followed by Dr. Ladona Ridgel and Jens Som.  I performed only the heart cath,and made referral to [facility].  TS

## 2010-03-24 NOTE — Miscellaneous (Signed)
Summary: [facility] Supplemental Orders   [facility] Supplemental [facility]   Imported By: Roderic Ovens 12/10/2009 16:04:04  _____________________________________________________________________  External Attachment:    Type:   Image     Comment:   External Document

## 2010-03-24 NOTE — Assessment & Plan Note (Signed)
Summary: 6 MTH ROV // RS rsc bmp ok per wife/njr   Vital Signs:  Patient profile:   70 year old male Weight:      222 pounds Pulse rate:   80 / minute Pulse rhythm:   regular BP sitting:   110 / 70  (left arm) Cuff size:   regular  Vitals Entered By: Raechel Ache, RN (August 22, 2009 11:35 AM) CC: 6 mo ROV   History of Present Illness: Here to follow up on multiple issues. He thinks he is doing well, and his wife and daughter agree. His appetite is excellent, he has put on some weight. He is walking well, and uses a cane primarily to steady himself. His moods have been excellent. He is to see Dr. Ladona Ridgel in a few weeks.   Allergies: 1)  ! Motrin Ib (Ibuprofen)  Past History:  Past Medical History: Reviewed history from 02/26/2009 and no changes required. HYPERTENSION (ICD-401.9) ATRIAL FIBRILLATION (ICD-427.31) sees Dr. 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 Orpha Bur in [place] ASTHMA (ICD-493.90) ALLERGIC RHINITIS (ICD-477.9) CHICKENPOX, HX OF (ICD-V15.9) THROMBOPHLEBITIS (ICD-451.9) DEMENTIA (ICD-294.8) GERD (ICD-530.81) OTHER DYSPHAGIA (WUX-324.40) PARKINSON'S DISEASE (ICD-332.0), sees Dr. Vickey Huger tinnitis, sees Dr. Haroldine Laws  Past Surgical History: Reviewed history from 03/13/2007 and no changes required. Tonsillectomy Umbilical hernia repaired Lt inguinal hernia repair Repair perforation to rt tympanic membrane x2 Varicose vein stripping to lt leg Herinated disc & remove bone spurs colonoscopy 2003  Review of Systems  The patient denies anorexia, fever, weight loss, weight gain, vision loss, decreased hearing, hoarseness, chest pain, syncope, dyspnea on exertion,  peripheral edema, prolonged cough, headaches, hemoptysis, abdominal pain, melena, hematochezia, severe indigestion/heartburn, hematuria, incontinence, genital sores, muscle weakness, suspicious skin lesions, transient blindness, difficulty walking, depression, unusual weight change, abnormal bleeding, enlarged lymph nodes, angioedema, breast masses, and testicular masses.    Physical Exam  General:  Well-developed,well-nourished,in no acute distress; alert,appropriate and cooperative throughout examination Neck:  No deformities, masses, or tenderness noted. Lungs:  Normal respiratory effort, chest expands symmetrically. Lungs are clear to auscultation, no crackles or wheezes. Heart:  normal rate, regular rhythm, no gallop, no rub, no JVD, and no HJR.  Has a 3/6 SM loudest at the mitral area Abdomen:  Bowel sounds positive,abdomen soft and non-tender without masses, organomegaly or hernias noted. Neurologic:  alert & oriented X3.   Psych:  Cognition and judgment appear intact. Alert and cooperative with normal attention span and concentration. No apparent delusions, illusions, hallucinations   Impression & Recommendations:  Problem # 1:  HYPERTENSION (ICD-401.9)  His updated medication list for this problem includes:    Cardizem 30 Mg Tabs (Diltiazem hcl) .Marland Kitchen... 1 by mouth two times a day  Problem # 2:  ATRIAL FIBRILLATION (ICD-427.31)  His updated medication list for this problem includes:    Cardizem 30 Mg Tabs (Diltiazem hcl) .Marland Kitchen... 1 by mouth two times a day    Amiodarone Hcl 200 Mg Tabs (Amiodarone hcl) .Marland Kitchen... Take one tablet by mouth daily    Aspirin 325 Mg Tabs (Aspirin) .Marland Kitchen... Take 1 tab by mouth every day  Problem # 3:  PULMONARY EMBOLISM, HX OF (ICD-V12.51)  His updated medication list for this problem includes:    Aspirin 325 Mg Tabs (Aspirin) .Marland Kitchen... Take 1 tab by mouth every day  Problem #  4:  DEPRESSION (ICD-311)  His updated medication list for this problem includes:     Trazodone Hcl 50 Mg Tabs (Trazodone hcl) .Marland Kitchen... Take one tablet by mouth once daily.    Lorazepam 0.5 Mg Tabs (Lorazepam) .Marland Kitchen... Take 3 by mouth every 6 hours    Sertraline Hcl 50 Mg Tabs (Sertraline hcl) ..... Once daily    Hydroxyzine Hcl 10 Mg Tabs (Hydroxyzine hcl) .Marland Kitchen... 1 by mouth every 6 hours as needed  Problem # 5:  ASTHMA (ICD-493.90)  Problem # 6:  DEMENTIA (ICD-294.8)  Problem # 7:  PARKINSON'S DISEASE (ICD-332.0)  Complete Medication List: 1)  Trazodone Hcl 50 Mg Tabs (Trazodone hcl) .... Take one tablet by mouth once daily. 2)  Cardizem 30 Mg Tabs (Diltiazem hcl) .Marland Kitchen.. 1 by mouth two times a day 3)  Lorazepam 0.5 Mg Tabs (Lorazepam) .... Take 3 by mouth every 6 hours 4)  Promethazine Hcl 25 Mg Tabs (Promethazine hcl) .... As needed 5)  Restoril 15 Mg Caps (Temazepam) .... 3 by mouth at bedtime 6)  Sertraline Hcl 50 Mg Tabs (Sertraline hcl) .... Once daily 7)  Amiodarone Hcl 200 Mg Tabs (Amiodarone hcl) .... Take one tablet by mouth daily 8)  Carbidopa-levodopa 25-100 Mg Tbdp (Carbidopa-levodopa) .Marland Kitchen.. 1 tab two times a day 9)  Chlorpromazine Hcl 25 Mg Tabs (Chlorpromazine hcl) .... Two times a day as needed hiccups 10)  Aspirin 325 Mg Tabs (Aspirin) .... Take 1 tab by mouth every day 11)  Miralax Powd (Polyethylene glycol 3350) .Marland Kitchen.. 17g by mouth once daily as needed constipation 12)  Levothyroxine Sodium 25 Mcg Tabs (Levothyroxine sodium) .Marland Kitchen.. 1 by mouth daily 13)  Hydroxyzine Hcl 10 Mg Tabs (Hydroxyzine hcl) .Marland Kitchen.. 1 by mouth every 6 hours as needed 14)  Flomax 0.4 Mg Caps (Tamsulosin hcl) .Marland Kitchen.. 1 by mouth once daily 15)  Pantoprazole Sodium 40 Mg Tbec (Pantoprazole sodium) .Marland Kitchen.. 1 two times a day  Patient Instructions: 1)  set up fasting labs soon

## 2010-03-24 NOTE — Miscellaneous (Signed)
Summary: refill  Clinical Lists Changes  Medications: Added new medication of PANTOPRAZOLE SODIUM 40 MG TBEC (PANTOPRAZOLE SODIUM) 1 two times a day - Signed Rx of PANTOPRAZOLE SODIUM 40 MG TBEC (PANTOPRAZOLE SODIUM) 1 two times a day;  #60 x 5;  Signed;  Entered by: Raechel Ache, RN;  Authorized by: Nelwyn Salisbury MD;  Method used: Electronically to [facility] [address], [place], [place]  [address], Ph: [phone], Fax: [phone]    Prescriptions: PANTOPRAZOLE SODIUM 40 MG TBEC (PANTOPRAZOLE SODIUM) 1 two times a day  #60 x 5   Entered by:   Raechel Ache, RN   Authorized by:   Nelwyn Salisbury MD   Signed by:   Raechel Ache, RN on 07/16/2009   Method used:   Electronically to        [facility] (retail)       [address]       [place], [place]  [address]       Ph: [phone]       Fax: [phone]   RxID:   [id number]

## 2010-03-24 NOTE — Assessment & Plan Note (Signed)
Summary: 3 month rov/njr   Vital Signs:  Patient profile:   70 year old male Weight:      202 pounds Temp:     97.3 degrees F oral Pulse rate:   76 / minute BP sitting:   90 / 62  (left arm) Cuff size:   large  Vitals Entered By: Alfred Levins, CMA (February 26, 2009 12:58 PM) CC: f/u   History of Present Illness: Here for follow up with his wife. he is doing quite well, and they both think he is feeling the best he has for years. His depression is stable, and he is more mobile and independent lately. He enjoyed the holidays, and visited with family. After some medications were removed (digoxin, lasix, and coumadin) he feels stronger and no longers gets weak or dizzy spells. Appetite is excellent.   Current Medications (verified): 1)  Trazodone Hcl 100 Mg Tabs (Trazodone Hcl) .... Take Two By Mouth At Bedtime 2)  Baclofen 10 Mg Tabs (Baclofen) .Marland Kitchen.. 1 By Mouth Once Daily 3)  Cardizem 30 Mg Tabs (Diltiazem Hcl) .Marland Kitchen.. 1 By Mouth Once Daily 4)  Flomax 0.4 Mg Xr24h-Cap (Tamsulosin Hcl) .... Take 1 By Mouth Once Daily 5)  Lorazepam 0.5 Mg Tabs (Lorazepam) .... Take 3 By Mouth Every 6 Hours 6)  Promethazine Hcl 25 Mg Tabs (Promethazine Hcl) .... As Needed 7)  Restoril 15 Mg Caps (Temazepam) .... 3 By Mouth At Bedtime 8)  Sertraline Hcl 50 Mg Tabs (Sertraline Hcl) .... Once Daily 9)  Amiodarone Hcl 200 Mg Tabs (Amiodarone Hcl) .... Take One Tablet By Mouth Twice A Day 10)  Digoxin 0.125 Mg Tabs (Digoxin) .... Take One Tablet By Mouth Daily 11)  Carbidopa-Levodopa 25-100 Mg Tbdp (Carbidopa-Levodopa) .Marland Kitchen.. 1 Tab Two Times A Day 12)  Chlorpromazine Hcl 25 Mg Tabs (Chlorpromazine Hcl) .... Two Times A Day As Needed Hiccups 13)  Aspirin 325 Mg Tabs (Aspirin) .... Take 1 Tab By Mouth Every Day 14)  Miralax   Powd (Polyethylene Glycol 3350) .Marland Kitchen.. 17g By Mouth Once Daily As Needed Constipation 15)  Levothyroxine Sodium 25 Mcg  Tabs (Levothyroxine Sodium) .Marland Kitchen.. 1 By Mouth Daily 16)  Cialis 20 Mg  Tabs (Tadalafil) .... As Needed 17)  Hydroxyzine Hcl 10 Mg Tabs (Hydroxyzine Hcl) .Marland Kitchen.. 1 By Mouth Every 6 Hours As Needed  Allergies (verified): 1)  ! Motrin Ib (Ibuprofen)  Past History:  Past Medical History: HYPERTENSION (ICD-401.9) ATRIAL FIBRILLATION (ICD-427.31) sees Dr. 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 Orpha Bur in [place] ASTHMA (ICD-493.90) ALLERGIC RHINITIS (ICD-477.9) CHICKENPOX, HX OF (ICD-V15.9) THROMBOPHLEBITIS (ICD-451.9) DEMENTIA (ICD-294.8) GERD (ICD-530.81) OTHER DYSPHAGIA (ICD-787.29) PARKINSON'S DISEASE (ICD-332.0), sees Dr. Vickey Huger tinnitis, sees Dr. Haroldine Laws  Review of Systems  The patient denies anorexia, fever, weight loss, weight gain, vision loss, decreased hearing, hoarseness, chest pain, syncope, dyspnea on exertion, peripheral edema, prolonged cough, headaches, hemoptysis, abdominal pain, melena, hematochezia, severe indigestion/heartburn, hematuria, incontinence, genital sores, muscle weakness, suspicious skin lesions, transient blindness, difficulty walking, depression, unusual weight change, abnormal bleeding, enlarged lymph nodes, angioedema, breast masses, and testicular masses.    Physical Exam  General:  Well-developed,well-nourished,in no acute distress; alert,appropriate and cooperative throughout examination Lungs:  Normal respiratory effort, chest expands symmetrically. Lungs are clear to auscultation, no crackles or wheezes. Heart:  Normal rate and regular rhythm.  S1 and S2 normal without gallop, murmur, click, rub or other extra sounds. Neurologic:  alert & oriented X3, cranial nerves II-XII intact, and strength normal in all extremities.  Walks  with a cane without assistance Psych:  Oriented X3, memory intact for recent and remote, normally interactive, and good eye contact.  Bright affect   Impression & Recommendations:  Problem # 1:  HYPERTENSION (ICD-401.9)  The following medications were removed from the medication list:    Lasix 40 Mg Tabs (Furosemide) .Marland Kitchen... 1/2 tab once daily His updated medication list for this problem includes:    Cardizem 30 Mg Tabs (Diltiazem hcl) .Marland Kitchen... 1 by mouth once daily  Problem # 2:  ATRIAL FIBRILLATION (ICD-427.31)  The following medications were removed from the medication list:    Digoxin 0.125 Mg Tabs (Digoxin) .Marland Kitchen... Take one tablet by mouth daily    Aspirin 81 Mg Chew (Aspirin) His updated medication list for this problem includes:    Cardizem 30 Mg Tabs (Diltiazem hcl) .Marland Kitchen... 1 by mouth once daily    Amiodarone Hcl 200 Mg Tabs (Amiodarone hcl) .Marland Kitchen... Take one tablet by mouth twice a day    Aspirin 325 Mg Tabs (Aspirin) .Marland Kitchen... Take 1 tab by mouth every day  Problem # 3:  DEPRESSION (ICD-311)  His updated medication list for this problem includes:    Trazodone Hcl 100 Mg Tabs (Trazodone hcl) .Marland Kitchen... Take two by mouth at bedtime    Lorazepam 0.5 Mg Tabs (Lorazepam) .Marland Kitchen... Take 3 by mouth every 6 hours    Sertraline Hcl 50 Mg Tabs (Sertraline hcl) ..... Once daily    Hydroxyzine Hcl 10 Mg Tabs (Hydroxyzine hcl) .Marland Kitchen... 1 by mouth every 6 hours as needed  Problem # 4:  PARKINSON'S DISEASE (ICD-332.0)  Complete Medication List: 1)  Trazodone Hcl 100 Mg Tabs (Trazodone hcl) .... Take two by mouth at bedtime 2)  Baclofen 10 Mg Tabs (Baclofen) .Marland Kitchen.. 1 by mouth once daily 3)  Cardizem 30 Mg Tabs (Diltiazem hcl) .Marland Kitchen.. 1 by mouth once daily 4)  Flomax 0.4 Mg Xr24h-cap (Tamsulosin hcl) .... Take 1 by mouth once daily 5)  Lorazepam 0.5 Mg Tabs (Lorazepam) .... Take 3 by mouth every 6 hours 6)  Promethazine Hcl 25 Mg Tabs (Promethazine hcl) .... As needed 7)  Restoril 15 Mg Caps (Temazepam) .... 3 by  mouth at bedtime 8)  Sertraline Hcl 50 Mg Tabs (Sertraline hcl) .... Once daily 9)  Amiodarone Hcl 200 Mg Tabs (Amiodarone hcl) .... Take one tablet by mouth twice a day 10)  Carbidopa-levodopa 25-100 Mg Tbdp (Carbidopa-levodopa) .Marland Kitchen.. 1 tab two times a day 11)  Chlorpromazine Hcl 25 Mg Tabs (Chlorpromazine hcl) .... Two times a day as needed hiccups 12)  Aspirin 325 Mg Tabs (Aspirin) .... Take 1 tab by mouth every day 13)  Miralax Powd (Polyethylene glycol 3350) .Marland Kitchen.. 17g by mouth once daily as needed constipation 14)  Levothyroxine Sodium 25 Mcg Tabs (Levothyroxine sodium) .Marland Kitchen.. 1 by mouth daily 15)  Cialis 20 Mg Tabs (Tadalafil) .... As needed 16)  Hydroxyzine Hcl 10 Mg Tabs (Hydroxyzine hcl) .Marland Kitchen.. 1 by mouth every 6 hours as needed  Patient Instructions: 1)  Please schedule a follow-up appointment in 6 months .  Prescriptions: LEVOTHYROXINE SODIUM 25 MCG  TABS (LEVOTHYROXINE SODIUM) 1 by mouth daily  #30 x 11   Entered and Authorized by:   Nelwyn Salisbury MD   Signed by:   Nelwyn Salisbury MD on 02/26/2009   Method used:  Electronically to        [facility] (retail)       [address]       [place], [place]  [address]       Ph: [phone]       Fax: [phone]   RxID:   [id number]

## 2010-03-24 NOTE — Miscellaneous (Signed)
Summary: Certification and Plan of Care/[facility]  Certification and Plan of Care/[facility]   Imported By: Maryln Gottron 10/06/2009 15:03:09  _____________________________________________________________________  External Attachment:    Type:   Image     Comment:   External Document

## 2010-03-24 NOTE — Letter (Signed)
Summary: Medical Necessity for Oxygen  Medical Necessity for Oxygen   Imported By: Maryln Gottron 12/09/2009 13:58:59  _____________________________________________________________________  External Attachment:    Type:   Image     Comment:   External Document

## 2010-03-24 NOTE — Miscellaneous (Signed)
Summary: Supplemental Orders/[facility]  Supplemental Orders/[facility]   Imported By: Maryln Gottron 10/20/2009 14:39:07  _____________________________________________________________________  External Attachment:    Type:   Image     Comment:   External Document

## 2010-03-24 NOTE — Letter (Signed)
Summary: Outpatient Coinsurance Notice  Outpatient Coinsurance Notice   Imported By: Marylou Mccoy 12/03/2009 13:46:31  _____________________________________________________________________  External Attachment:    Type:   Image     Comment:   External Document

## 2010-03-24 NOTE — Miscellaneous (Signed)
  Clinical Lists Changes  Observations: Added new observation of ECHOINTERP:  Study Conclusions    1. Left ventricle: The cavity size was mildly dilated. Wall      thickness was normal. Systolic function was normal. The estimated      ejection fraction was in the range of 55% to 65%. Wall motion was      normal; there were no regional wall motion abnormalities.      Features are consistent with a pseudonormal left ventricular      filling pattern, with concomitant abnormal relaxation and      increased filling pressure (grade 2 diastolic dysfunction).   2. Mitral valve: Calcified annulus. Mildly thickened leaflets .      Prolapse, involving the posterior leaflet. Moderate      regurgitation.   3. Left atrium: The atrium was moderately dilated.   4. Atrial septum: There was an atrial septal aneurysm.   5. Pulmonary arteries: Systolic pressure was mildly increased. PA      peak pressure: 34mm Hg (S).    Prepared and Electronically Authenticated by    Olga Millers, MD, [facility]   2010-08-19T16:31:[id number]  (10/10/2008 10:54)      Echocardiogram  Procedure date:  10/10/2008  Findings:       Study Conclusions    1. Left ventricle: The cavity size was mildly dilated. Wall      thickness was normal. Systolic function was normal. The estimated      ejection fraction was in the range of 55% to 65%. Wall motion was      normal; there were no regional wall motion abnormalities.      Features are consistent with a pseudonormal left ventricular      filling pattern, with concomitant abnormal relaxation and      increased filling pressure (grade 2 diastolic dysfunction).   2. Mitral valve: Calcified annulus. Mildly thickened leaflets .      Prolapse, involving the posterior leaflet. Moderate      regurgitation.   3. Left atrium: The atrium was moderately dilated.   4. Atrial septum: There was an atrial septal aneurysm.   5. Pulmonary arteries: Systolic pressure was mildly increased.  PA      peak pressure: 34mm Hg (S).    Prepared and Electronically Authenticated by    Olga Millers, MD, [facility]   2010-08-19T16:31:[id number]

## 2010-03-24 NOTE — Progress Notes (Signed)
Summary: ER 7/4 - f/u here next week  Phone Note Call from Patient   Caller: Spouse Call For: Nelwyn Salisbury MD Reason for Call: Talk to Nurse Summary of Call: wife called into inform that they missed lab appt due to car trouble this AM - pt was [facility] to [facility] ER 7/4 for chest tighness and weakness - not admitted. Is to f/u with Dr. Ladona Ridgel - but that appt not until Aug. pt stable at this time. Advised to make appt next week to see Dr. [name] since appt with Dr. Ladona Ridgel not until Aug. KIK Initial call taken by: Duard Brady LPN,  August 27, 1608 11:05 AM

## 2010-03-24 NOTE — Miscellaneous (Signed)
Summary: Supplemental Orders/[facility]  Supplemental Orders/[facility]   Imported By: Maryln Gottron 11/17/2009 12:45:08  _____________________________________________________________________  External Attachment:    Type:   Image     Comment:   External Document

## 2010-03-24 NOTE — Progress Notes (Signed)
Summary: pt wants to change to dr [name]  Phone Note Call from Patient   Caller: Patient Reason for Call: Talk to Nurse Summary of Call: pt was in hospital for open heart surgery, see's dr Ladona Ridgel, but also saw dr [name] while in the hospital, pt wants to continue to see dr [name], cancelled appt with dr taylor 8-4, will dr [name] take this pt on? pls call [phone] Initial call taken by: [name],  September 22, 2009 10:02 AM  Follow-up for Phone Call        Will foward to Dr  [name] and his nurse for review.  It will be up to Dr [name]. I called pt and he is aware that Dr [name] is reviewing and we will call him back. LM on [phone] Wayne Bast, RN, BSN  September 22, 2009 1:05 PM  I spoke with Dr [name] about this pt and he recommended that the pt continue to follow-up with Dr Ladona Ridgel.  Per Dr [name] he is planning on retiring in the next 2-3 years and this pt is going to require long-term management by a Cardiologist.  I spoke with the pt's wife and rescheduled the pt's appt on 09/25/09 with Dr Ladona Ridgel. The pt is upset because he has seen Dr Ladona Ridgel in the office and has had EKGs and he does not understand why Dr Ladona Ridgel did not find the problem with valve and blockages.  The pt plans to discuss this with Dr Ladona Ridgel at his appt.  Follow-up by: Wayne Gutting, RN, BSN,  September 22, 2009 5:58 PM

## 2010-03-24 NOTE — Assessment & Plan Note (Signed)
Summary: 6 month/dmp   Primary Provider:  Dr. Shellia Carwin   History of Present Illness: [name] returns today for followup of his atrial fib, pulmonary HTN, and CHF.  He has maintained NSR very nicely over the past 6 months on high dose amiodarone.  He does still have problems with dyspnea but denies c/p.  He has bad dreams.  No other complaints except generalized fatigue.  Current Medications (verified): 1)  Trazodone Hcl 50 Mg Tabs (Trazodone Hcl) .... Take One Tablet By Mouth Once Daily. 2)  Cardizem 30 Mg Tabs (Diltiazem Hcl) .Marland Kitchen.. 1 By Mouth Two Times A Day 3)  Lorazepam 0.5 Mg Tabs (Lorazepam) .... Take 3 By Mouth Every 6 Hours 4)  Promethazine Hcl 25 Mg Tabs (Promethazine Hcl) .... As Needed 5)  Restoril 15 Mg Caps (Temazepam) .... 3 By Mouth At Bedtime 6)  Sertraline Hcl 50 Mg Tabs (Sertraline Hcl) .... Once Daily 7)  Amiodarone Hcl 200 Mg Tabs (Amiodarone Hcl) .... Take One Tablet By Mouth Twice A Day 8)  Carbidopa-Levodopa 25-100 Mg Tbdp (Carbidopa-Levodopa) .Marland Kitchen.. 1 Tab Two Times A Day 9)  Chlorpromazine Hcl 25 Mg Tabs (Chlorpromazine Hcl) .... Two Times A Day As Needed Hiccups 10)  Aspirin 325 Mg Tabs (Aspirin) .... Take 1 Tab By Mouth Every Day 11)  Miralax   Powd (Polyethylene Glycol 3350) .Marland Kitchen.. 17g By Mouth Once Daily As Needed Constipation 12)  Levothyroxine Sodium 25 Mcg  Tabs (Levothyroxine Sodium) .Marland Kitchen.. 1 By Mouth Daily 13)  Hydroxyzine Hcl 10 Mg Tabs (Hydroxyzine Hcl) .Marland Kitchen.. 1 By Mouth Every 6 Hours As Needed  Allergies: 1)  ! Motrin Ib (Ibuprofen)  Past History:  Past Medical History: Last updated: 02/26/2009 HYPERTENSION (ICD-401.9) ATRIAL FIBRILLATION (ICD-427.31) sees Dr. 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 Orpha Bur in [place] ASTHMA (ICD-493.90) ALLERGIC RHINITIS (ICD-477.9) CHICKENPOX, HX OF (ICD-V15.9) THROMBOPHLEBITIS (ICD-451.9) DEMENTIA (ICD-294.8) GERD (ICD-530.81) OTHER DYSPHAGIA (ICD-787.29) PARKINSON'S DISEASE (ICD-332.0), sees Dr. Vickey Huger tinnitis, sees Dr. Haroldine Laws  Past Surgical History: Last updated: 03/13/2007 Tonsillectomy Umbilical hernia repaired Lt inguinal hernia repair Repair perforation to rt tympanic membrane x2 Varicose vein stripping to lt leg Herinated disc & remove bone spurs colonoscopy 2003  Review of Systems       The patient complains of dyspnea on exertion.  The patient denies chest pain, syncope, and peripheral edema.    Vital Signs:  Patient profile:   70 year old male Height:      74 inches Weight:      209 pounds BMI:     26.93 Pulse rate:   70 / minute BP sitting:   100 / 70  (left arm)  Vitals Entered By: Laurance Flatten CMA (March 31, 2009 1:45 PM)  Physical Exam  General:  Well-developed,well-nourished,in no acute distress; alert,appropriate and cooperative throughout examination Head:  normocephalic and atraumatic Eyes:  PERRLA/EOM intact; conjunctiva and lids normal. Mouth:  Teeth, gums and palate normal. Oral mucosa normal. Neck:  No deformities, masses, or tenderness noted. Lungs:  Normal respiratory effort, chest expands symmetrically. Lungs are clear to auscultation, no crackles or wheezes. Heart:  Normal rate and regular rhythm. S1 and S2 normal without gallop, murmur, click, rub or other extra sounds. Abdomen:  Bowel sounds positive,abdomen soft and non-tender without masses, organomegaly or hernias noted. Msk:  Back normal,  normal gait. Muscle strength and tone normal. Pulses:  pulses normal in all 4 extremities Extremities:  No clubbing or cyanosis. Neurologic:  alert & oriented X3, cranial nerves II-XII intact, and strength normal in all extremities.  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  I have asked him to increase his activity and to maintain a low sodium diet. His updated medication list for this problem includes:    Cardizem 30 Mg Tabs (Diltiazem hcl) .Marland Kitchen... 1 by mouth two times a day    Amiodarone Hcl 200 Mg Tabs (Amiodarone hcl) .Marland Kitchen... Take one tablet by mouth twice a day    Aspirin 325 Mg Tabs (Aspirin) .Marland Kitchen... Take 1 tab by mouth every day  Problem # 2:  ATRIAL FIBRILLATION (ICD-427.31) Because he has maintained NSR, I have asked him to decrease his amiodarone to 200 mg daily. His updated medication list for this problem includes:    Amiodarone Hcl 200 Mg Tabs (Amiodarone hcl) .Marland Kitchen... Take one tablet by mouth daily    Aspirin 325 Mg Tabs (Aspirin) .Marland Kitchen... Take 1 tab by mouth every day  Patient Instructions: 1)  Your physician recommends that you schedule a follow-up appointment in: 6 months with Dr Ladona Ridgel 2)  Your physician has recommended you make the following change in your medication: decrease your Amiodarone to 200mg  daily

## 2010-03-25 ENCOUNTER — Ambulatory Visit (INDEPENDENT_AMBULATORY_CARE_PROVIDER_SITE_OTHER): Payer: MEDICARE | Admitting: Cardiology

## 2010-03-25 ENCOUNTER — Encounter: Payer: Self-pay | Admitting: Cardiology

## 2010-03-25 DIAGNOSIS — I059 Rheumatic mitral valve disease, unspecified: Secondary | ICD-10-CM

## 2010-03-25 DIAGNOSIS — I2510 Atherosclerotic heart disease of native coronary artery without angina pectoris: Secondary | ICD-10-CM

## 2010-03-25 DIAGNOSIS — E7800 Pure hypercholesterolemia, unspecified: Secondary | ICD-10-CM

## 2010-03-26 NOTE — Letter (Signed)
Summary: [facility]   Imported By: Marylou Mccoy 02/25/2010 11:28:38  _____________________________________________________________________  External Attachment:    Type:   Image     Comment:   External Document

## 2010-03-26 NOTE — Assessment & Plan Note (Signed)
Summary:  Cardiology   Visit Type:  Follow-up Primary Provider:  Dr. Shellia Carwin   History of Present Illness: 70 year old male with past medical history of coronary artery disease and atrial fibrillation for followup. Patient admitted in July of 2011 with chest pain. Cardiac catheterization revealed an ejection fraction of 55-60% and significant mitral regurgitation. The right coronary artery had no significant stenosis. There was a 40% circumflex. The proximal portion of the left anterior descending artery has about 90% narrowing. Echocardiogram showed normal LV function and severe mitral regurgitation. On July 12 of 2011 the patient underwent  two-vessel coronary artery bypass grafting (left internal mammary artery to LAD, saphenous vein graft to obtuse marginal) and MV repair with a 22-mm Edwards ring and oversewing of left atrial appendage. Echo in Oct 2011 revealed mild systolic dysfunction, EF 45-50%; s/p mitral valve repair. There was no significant stenosis. There was a very eccentric jet of mitral regurgitation that was poorly characterized. It appeared mild but given a very complete CW doppler jet it could be more moderate. Normal RV size with mild systolic dysfunction. Mild pulmonary hypertension. Patient also with orthostatic hypotension. Since I last saw him in August of 2011, the patient denies any dyspnea on exertion, orthopnea, PND, pedal edema, palpitations, syncope or chest pain. His dizziness with standing has completely resolved after discontinuing Lasix. He did fall recently losing his balance.   Current Medications (verified): 1)  Trazodone Hcl 100 Mg Tabs (Trazodone Hcl) .... Take 1 Tab By Mouth At Bedtime 2)  Promethazine Hcl 25 Mg Tabs (Promethazine Hcl) .... As Needed 3)  Sertraline Hcl 50 Mg Tabs (Sertraline Hcl) .... Once Daily 4)  Carbidopa-Levodopa 25-100 Mg Tbdp (Carbidopa-Levodopa) .Marland Kitchen.. 1 Tab Two Times A Day 5)  Chlorpromazine Hcl 25 Mg Tabs (Chlorpromazine Hcl)  .... Two Times A Day As Needed Hiccups 6)  Aspirin 81 Mg  Tabs (Aspirin) .Marland Kitchen.. 1 Tab By Mouth Once Daily 7)  Miralax   Powd (Polyethylene Glycol 3350) .Marland Kitchen.. 17g By Mouth Once Daily As Needed Constipation 8)  Flomax 0.4 Mg Caps (Tamsulosin Hcl) .Marland Kitchen.. 1 By Mouth Once Daily 9)  Pantoprazole Sodium 40 Mg Tbec (Pantoprazole Sodium) .Marland Kitchen.. 1 Two Times A Day Needs Office Viist 10)  Multivitamins   Tabs (Multiple Vitamin) .Marland Kitchen.. 1 Tab By Mouth Once Daily 11)  Crestor 10 Mg Tabs (Rosuvastatin Calcium) .... Take One Tablet By Mouth Daily. 12)  Ativan 0.5 Mg Tabs (Lorazepam) .... Take 3  Tab By Mouth Three Times A Day  Allergies: 1)  ! Motrin Ib (Ibuprofen)  Past History:  Past Medical History: HYPERTENSION (ICD-401.9) ATRIAL FIBRILLATION (ICD-427.31) sees Dr. 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 Orpha Bur in [place] ASTHMA (ICD-493.90) CHICKENPOX, HX OF (ICD-V15.9) THROMBOPHLEBITIS (ICD-451.9) DEMENTIA (ICD-294.8) GERD (ICD-530.81) PARKINSON'S DISEASE (ICD-332.0), sees Dr. Vickey Huger CAD tinnitis, sees Dr. Haroldine Laws  Past Surgical History: Tonsillectomy Umbilical hernia repaired Lt inguinal hernia repair Repair perforation to rt tympanic membrane x2 Varicose vein stripping to lt leg Herinated disc & remove bone spurs colonoscopy 2003 S/P CABG and MV repair  Social History: Reviewed history from 03/13/2007 and no changes required. Married Never Smoked Alcohol use-no Drug use-no  Review of Systems       Fatigue and decreased energy as well as unsteady gait but no fevers or chills, productive cough, hemoptysis, dysphasia, odynophagia, melena, hematochezia, dysuria, hematuria, rash, seizure activity, orthopnea, PND, pedal  edema, claudication. Remaining systems are negative.   Vital  Signs:  Patient profile:   70 year old male Height:      74 inches Weight:      219.50 pounds BMI:     28.28 Pulse rate:   89 / minute Pulse rhythm:   regular Resp:     18 per minute BP sitting:   104 / 63  (left arm) Cuff size:   large  Vitals Entered By: Vikki Ports (February 11, 2010 2:22 PM)  Physical Exam  General:  Well-developed well-nourished in no acute distress.  Skin is warm and dry.  HEENT is normal.  Neck is supple. No thyromegaly.  Chest is clear to auscultation with normal expansion.  Cardiovascular exam is regular rate and rhythm. 2/6 systolic murmur. Abdominal exam nontender or distended. No masses palpated. Extremities show no edema. neuro resting tremor    EKG  Procedure date:  02/11/2010  Findings:      sinus rhythm at a rate of 89. Occasional PVCs. No ST changes.  Impression & Recommendations:  Problem # 1:  MITRAL VALVE REPLACEMENT, HX OF (ICD-V15.1) Continue SBE prophylaxis. Some residual mitral regurgitation. Followup echocardiogram in the future.  Problem # 2:  CAD (ICD-414.00)  Continue aspirin and statin. His updated medication list for this problem includes:    Aspirin 81 Mg Tabs (Aspirin) .Marland Kitchen... 1 tab by mouth once daily  His updated medication list for this problem includes:    Aspirin 81 Mg Tabs (Aspirin) .Marland Kitchen... 1 tab by mouth once daily  Orders: EKG w/ Interpretation (93000)  Problem # 3:  ORTHOSTATIC HYPOTENSION (ICD-458.0) Symptoms have resolved after discontinuing Lasix. We'll consider Florinef in the future if symptoms worsen. I asked him previously to discuss a different medicine for BPH with his urologist.  Problem # 4:  ATRIAL FIBRILLATION (ICD-427.31) Patient has not had any episodes since surgery. Note his amiodarone was discontinued at the time of his mitral valve repair. I will follow this and we can resume if he has recurrent bouts in the future. Continue aspirin. Not a Coumadin candidate given history of recurrent  falls. His updated medication list for this problem includes:    Aspirin 81 Mg Tabs (Aspirin) .Marland Kitchen... 1 tab by mouth once daily  Problem # 5:  PARKINSON'S DISEASE (ICD-332.0)  Problem # 6:  HYPERLIPIDEMIA (ICD-272.4) Continue statin.check lipids and liver. His updated medication list for this problem includes:    Crestor 10 Mg Tabs (Rosuvastatin calcium) .Marland Kitchen... Take one tablet by mouth daily.  His updated medication list for this problem includes:    Crestor 10 Mg Tabs (Rosuvastatin calcium) .Marland Kitchen... Take one tablet by mouth daily.  Patient Instructions: 1)  Your physician recommends that you schedule a follow-up appointment in: 6 MONTHS WITH DR CRENSHAW 2)  Your physician recommends that you continue on your current medications as directed. Please refer to the Current Medication list given to you today. 3)  Your physician recommends that you return for lab work [id number] LIPID LIVER AT PT'S CONVENIENCE

## 2010-03-26 NOTE — Progress Notes (Signed)
Summary: wants home physical therapy  Phone Note Call from Patient Call back at Home Phone [phone]   Caller: Spouse---triage vm Summary of Call: pt is having physical therapy at the [facility] rehab. having a financial hardship. Wife wants to know if he is elligble for homecare, because it is hard for him to get to pt. He needs some therapy per dr Jens Som and Dr Vickey Huger. Was told to call Dr [name]. Initial call taken by: Warnell Forester,  March 05, 2010 9:34 AM  Follow-up for Phone Call        per dr fry since Dr Dohimer treats his parkinson he would need to contact her office for home health   pt aware  Follow-up by: Pura Spice, RN,  March 05, 2010 1:23 PM

## 2010-03-26 NOTE — Progress Notes (Signed)
Summary: Pt req refills for Trazadone and Lorazepam  Phone Note Refill Request Call back at Home Phone [phone] Message from:  spouse Pamelia Hoit on February 06, 2010 2:25 PM  Refills Requested: Medication #1:  TRAZODONE HCL 150 MG TABS 1 tab by mouth at bedtime   Dosage confirmed as above?Dosage Confirmed  Medication #2:  LORAZEPAM 0.5 MG TABS as needed   Dosage confirmed as above?Dosage Confirmed Pls call in to [facility] [phone] ask for Tereso Newcomer   Method Requested: Telephone to Pharmacy Initial call taken by: Lucy Antigua,  February 06, 2010 2:24 PM  Follow-up for Phone Call        call in Trazodone #30 with 11 rf, also Lorazepam #90 with 5 rf Follow-up by: Nelwyn Salisbury MD,  February 06, 2010 5:31 PM  Additional Follow-up for Phone Call Additional follow up Details #1::        Rx called to pharmacy Additional Follow-up by: Alfred Levins, CMA,  February 06, 2010 5:36 PM    New/Updated Medications: LORAZEPAM 0.5 MG TABS (LORAZEPAM) three times a day as needed anxiety TEMAZEPAM 15 MG CAPS (TEMAZEPAM) 1 by mouth at bedtime Prescriptions: TRAZODONE HCL 150 MG TABS (TRAZODONE HCL) 1 tab by mouth at bedtime  #30 x 11   Entered by:   Alfred Levins, CMA   Authorized by:   Nelwyn Salisbury MD   Signed by:   Alfred Levins, CMA on 02/06/2010   Method used:   Telephoned to ...       [facility] (retail)       [address]       [place], [place]  [address]       Ph: [phone]       Fax: [phone]   RxID:   [id number] SERTRALINE HCL 50 MG TABS (SERTRALINE HCL) once daily  #30 x 11   Entered by:   Alfred Levins, CMA   Authorized by:   Nelwyn Salisbury MD   Signed by:   Alfred Levins, CMA on 02/06/2010   Method used:   Telephoned to ...       [facility] (retail)       [address]       [place], [place]  [address]       Ph: [phone]       Fax: [phone]   RxID:   [id number] LORAZEPAM 0.5 MG TABS (LORAZEPAM) three times a day as needed anxiety  #30 x 5  Entered by:   Alfred Levins, CMA   Authorized by:   Nelwyn Salisbury MD   Signed by:   Alfred Levins, CMA on 02/06/2010   Method used:   Telephoned to ...       [facility] (retail)       [address]       [place], [place]  [address]       Ph: [phone]       Fax: [phone]   RxID:   [id number] TEMAZEPAM 15 MG CAPS (TEMAZEPAM) 1 by mouth at bedtime  #30 x 5   Entered by:   Alfred Levins, CMA   Authorized by:   Nelwyn Salisbury MD   Signed by:   Alfred Levins, CMA on 02/06/2010   Method used:   Telephoned to ...       [facility] (retail)       [address]       [place], [place]  [address]       Ph: [phone]  Fax: [phone]   RxID:   [id number]
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
## 2010-03-26 NOTE — Letter (Signed)
Summary: [facility]  [facility]   Imported By: Marylou Mccoy 02/25/2010 16:24:29  _____________________________________________________________________  External Attachment:    Type:   Image     Comment:   External Document

## 2010-03-26 NOTE — Letter (Signed)
Summary: [facility] Office Visit Note   [facility] Office Visit Note   Imported By: Roderic Ovens 02/26/2010 15:44:56  _____________________________________________________________________  External Attachment:    Type:   Image     Comment:   External Document

## 2010-03-26 NOTE — Progress Notes (Signed)
Summary: pt needs a note to start PT  Phone Note Call from Patient Call back at Home Phone [phone]   Caller: Spouse Reason for Call: Talk to Nurse, Talk to Doctor Summary of Call: pt was seen in [place] today and Dr. Jens Som told pt he could do PT so pt needs a not for this sent to specialty facility which is the same building where the office is the number is [phone] teresa wants to set him for next Wednesday the 28th so he needs a note sent prior to this date   Initial call taken by: Omer Jack,  February 11, 2010 4:11 PM  Follow-up for Phone Call        will have Dr Jens Som sign note in AM and fax Meredith Staggers, RN  February 11, 2010 5:10 PM   note faxed Meredith Staggers, RN  February 12, 2010 3:23 PM      Appended Document: pt needs a note to start PT pts wife aware, note was faxed to [phone]

## 2010-03-26 NOTE — Progress Notes (Signed)
Summary: tal to nurse   Phone Note Other Incoming Call back at Home Phone [phone]   Caller: nurse Byrd Hesselbach Summary of Call: per Byrd Hesselbach from cardiac rehab. Bp was low with orthostatic hypotension. hold excercise until he is seen in ofc.  Initial call taken by: Edman Circle,  February 03, 2010 3:29 PM  Follow-up for Phone Call        spoke with pt wife, he has been having episodes for dizziness and fluctuations in bp. meds reviewed. pt has an appt next week. pt wife states he is not drinking. stressed the importance of hydration. pt will not exercise until seen Deliah Goody, RN  February 03, 2010 5:36 PM

## 2010-03-26 NOTE — Miscellaneous (Signed)
Summary: [facility] Order/Treatment Plan   [facility] Physician Order/Treatment Plan   Imported By: Roderic Ovens 02/17/2010 16:36:09  _____________________________________________________________________  External Attachment:    Type:   Image     Comment:   External Document

## 2010-03-26 NOTE — Letter (Signed)
Summary: [facility] 2009 - 2011  [facility] 2009 - 2011   Imported By: Maryln Gottron 02/19/2010 11:02:28  _____________________________________________________________________  External Attachment:    Type:   Image     Comment:   External Document

## 2010-03-26 NOTE — Progress Notes (Signed)
  Phone Note Call from Patient Call back at Home Phone [phone]   Caller: Patient Call For: Nelwyn Salisbury MD Summary of Call: Pt wants to know if they can stop seeing the pyschiatrist, and get the meds from Dr. [name]?  Having financial problems.  Initial call taken by: Lynann Beaver CMA AAMA,  January 30, 2010 2:11 PM  Follow-up for Phone Call        Wife is callng again to see if Dr. [name] will give him meds??? Follow-up by: Lynann Beaver CMA AAMA,  February 02, 2010 3:12 PM  Additional Follow-up for Phone Call Additional follow up Details #1::        I could give him his psychiatric meds, but he would still need to see the Neurologist for Parkinsons meds Additional Follow-up by: Nelwyn Salisbury MD,  February 02, 2010 6:08 PM    Additional Follow-up for Phone Call Additional follow up Details #2::    Notified wife. Follow-up by: Lynann Beaver CMA AAMA,  February 03, 2010 9:05 AM

## 2010-03-26 NOTE — Progress Notes (Signed)
Summary: Pt req refill of Sertraline to [facility] Note Refill Request Call back at Home Phone [phone] Message from:  spouse Bjorn Loser on March 05, 2010 8:34 AM  Refills Requested: Medication #1:  SERTRALINE HCL 50 MG TABS once daily   Dosage confirmed as above?Dosage Confirmed   Supply Requested: 1 month Pt completely out of med. Pls call in asap today. Pt has been out of med for 2 days.    Method Requested: Telephone to Pharmacy Initial call taken by: Lucy Antigua,  March 05, 2010 8:34 AM  Follow-up for Phone Call        done  pt called  Follow-up by: Pura Spice, RN,  March 05, 2010 1:20 PM    Prescriptions: SERTRALINE HCL 50 MG TABS (SERTRALINE HCL) once daily  #30 x 11   Entered by:   Pura Spice, RN   Authorized by:   Nelwyn Salisbury MD   Signed by:   Pura Spice, RN on 03/05/2010   Method used:   Electronically to        [facility] (retail)       [address]       [place], [place]  [address]       Ph: [phone]       Fax: [phone]   RxID:   [id number]

## 2010-03-26 NOTE — Miscellaneous (Signed)
Summary: [facility]   [facility]   Imported By: Roderic Ovens 02/24/2010 11:38:39  _____________________________________________________________________  External Attachment:    Type:   Image     Comment:   External Document

## 2010-03-27 NOTE — Miscellaneous (Signed)
Summary: Discharge Summary/[facility]  Discharge Summary/[facility]   Imported By: Maryln Gottron 01/12/2010 13:08:41  _____________________________________________________________________  External Attachment:    Type:   Image     Comment:   External Document

## 2010-04-01 NOTE — Assessment & Plan Note (Signed)
Summary: [facility]   Visit Type:  Follow-up Primary Provider:  Dr. Shellia Carwin  CC:  No complaints.  History of Present Illness: 70 year old male with past medical history of coronary artery disease and atrial fibrillation for followup. Patient admitted in July of 2011 with chest pain. Cardiac catheterization revealed an ejection fraction of 55-60% and significant mitral regurgitation. The right coronary artery had no significant stenosis. There was a 40% circumflex. The proximal portion of the left anterior descending artery has about 90% narrowing. Echocardiogram showed normal LV function and severe mitral regurgitation. On July 12 of 2011 the patient underwent  two-vessel coronary artery bypass grafting (left internal mammary artery to LAD, saphenous vein graft to obtuse marginal) and MV repair with a 22-mm Edwards ring and oversewing of left atrial appendage. Echo in Oct 2011 revealed mild systolic dysfunction, EF 45-50%; s/p mitral valve repair. There was no significant stenosis. There was a very eccentric jet of mitral regurgitation that was poorly characterized. It appeared mild but given a very complete CW doppler jet it could be more moderate. Normal RV size with mild systolic dysfunction. Mild pulmonary hypertension. Patient also with orthostatic hypotension. Since I last saw him in Dec of 2011, he denies dyspnea on exertion, orthopnea, PND, pedal edema, palpitations, syncope or chest pain.   Current Medications (verified): 1)  Trazodone Hcl 100 Mg Tabs (Trazodone Hcl) .... Take 1 Tab By Mouth At Bedtime 2)  Promethazine Hcl 25 Mg Tabs (Promethazine Hcl) .... As Needed 3)  Sertraline Hcl 50 Mg Tabs (Sertraline Hcl) .... Once Daily 4)  Carbidopa-Levodopa 25-100 Mg Tbdp (Carbidopa-Levodopa) .Marland Kitchen.. 1 Tab Two Times A Day 5)  Chlorpromazine Hcl 25 Mg Tabs (Chlorpromazine Hcl) .... Two Times A Day As Needed Hiccups 6)  Aspirin 81 Mg  Tabs (Aspirin) .Marland Kitchen.. 1 Tab By Mouth Once Daily 7)   Miralax   Powd (Polyethylene Glycol 3350) .Marland Kitchen.. 17g By Mouth Once Daily As Needed Constipation 8)  Flomax 0.4 Mg Caps (Tamsulosin Hcl) .Marland Kitchen.. 1 By Mouth Once Daily 9)  Pantoprazole Sodium 40 Mg Tbec (Pantoprazole Sodium) .Marland Kitchen.. 1 Two Times A Day Needs Office Viist 10)  Multivitamins   Tabs (Multiple Vitamin) .Marland Kitchen.. 1 Tab By Mouth Once Daily 11)  Crestor 10 Mg Tabs (Rosuvastatin Calcium) .... Take One Tablet By Mouth Daily. 12)  Ativan 0.5 Mg Tabs (Lorazepam) .... Take 3  Tab By Mouth Three Times A Day  Allergies: 1)  ! Motrin Ib (Ibuprofen)  Past History:  Past Medical History: Reviewed history from 02/11/2010 and no changes required. HYPERTENSION (ICD-401.9) ATRIAL FIBRILLATION (ICD-427.31) sees Dr. 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 Orpha Bur in [place] ASTHMA (ICD-493.90) CHICKENPOX, HX OF (ICD-V15.9) THROMBOPHLEBITIS (ICD-451.9) DEMENTIA (ICD-294.8) GERD (ICD-530.81) PARKINSON'S DISEASE (ICD-332.0), sees Dr. 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 Tonsillectomy Umbilical hernia repaired Lt inguinal hernia repair Repair perforation to rt tympanic membrane x2 Varicose vein stripping to lt leg Herinated disc & remove bone spurs colonoscopy 2003 S/P CABG and MV repair  Social History: Reviewed history from 03/13/2007 and no changes required. Married Never Smoked Alcohol use-no Drug use-no  Review of Systems       Problems with worsening Parkinson's symptoms but no fevers or chills, productive cough, hemoptysis, dysphasia, odynophagia, melena, hematochezia, dysuria, hematuria, rash, seizure activity, orthopnea, PND, pedal edema, claudication. Remaining  systems are negative.   Vital  Signs:  Patient profile:   70 year old male Height:      74 inches Weight:      225.75 pounds BMI:     29.09 Pulse rate:   87 / minute Pulse rhythm:   regular Resp:     18 per minute BP sitting:   102 / 66  (right arm) Cuff size:   large  Vitals Entered By: Vikki Ports (March 25, 2010 3:38 PM)  Physical Exam  General:  Well-developed well-nourished in no acute distress.  Skin is warm and dry.  HEENT is normal.  Neck is supple. No thyromegaly.  Chest is clear to auscultation with normal expansion.  Cardiovascular exam is regular rate and rhythm.  Abdominal exam nontender or distended. No masses palpated. Extremities show no edema. neuro consistent with Parkinson's   EKG  Procedure date:  03/25/2010  Findings:      Sinus rhythm at a rate of 87. Left axis deviation. No ST changes.  Impression & Recommendations:  Problem # 1:  HYPERLIPIDEMIA (ICD-272.4) Continue statin. Check lipids and liver. His updated medication list for this problem includes:    Crestor 10 Mg Tabs (Rosuvastatin calcium) .Marland Kitchen... Take one tablet by mouth daily.  Orders: T-Hepatic Function [phone]) T-Lipid Profile [id number])  Problem # 2:  MITRAL VALVE REPLACEMENT, HX OF (ICD-V15.1) History of mitral valve repair with residual MR. He will need followup echocardiogram in the future. Continued SBE prophylaxis.  Problem # 3:  CAD (ICD-414.00) Continue aspirin and statin. His updated medication list for this problem includes:    Aspirin 81 Mg Tabs (Aspirin) .Marland Kitchen... 1 tab by mouth once daily  Problem # 4:  ORTHOSTATIC HYPOTENSION (ICD-458.0) Symptoms resolved after discontinuing diuretic.  Problem # 5:  HYPERTENSION (ICD-401.9) Blood pressure controlled. His updated medication list for this problem includes:    Aspirin 81 Mg Tabs (Aspirin) .Marland Kitchen... 1 tab by mouth once daily  Problem # 6:  ATRIAL FIBRILLATION (ICD-427.31) No recurrences. Continue aspirin. Not a Coumadin candidate given her  history of falls. His updated medication list for this problem includes:    Aspirin 81 Mg Tabs (Aspirin) .Marland Kitchen... 1 tab by mouth once daily  Problem # 7:  PARKINSON'S DISEASE (ICD-332.0)  Patient Instructions: 1)  Your physician recommends that you return for lab work WHEN FASTING 2)  Your physician wants you to follow-up in: 6 MONTHS  You will receive a reminder letter in the mail two months in advance. If you don't receive a letter, please call our office to schedule the follow-up appointment.

## 2010-04-19 IMAGING — CT CT HEAD W/O CM
1 series · 16 of 30 positions shown, 20 images · non-contrast
Comparison: MRI brain 09/08/2007 and head CT 09/07/2007.

CLINICAL DATA: Altered level of consciousness.

CT HEAD WITHOUT CONTRAST
TECHNIQUE: Contiguous axial images were obtained from the base of
the skull through the vertex without contrast.

[Series 2: head 4.8 h37s · axial · 0.45mm/px · z∈[+1115,+1271]mm · 16 of 36 slices shown, 20 images]
[im 2/36  brain]
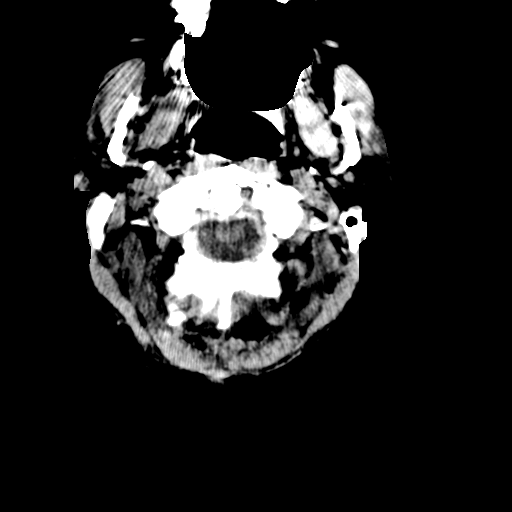
[im 2/36  bone]
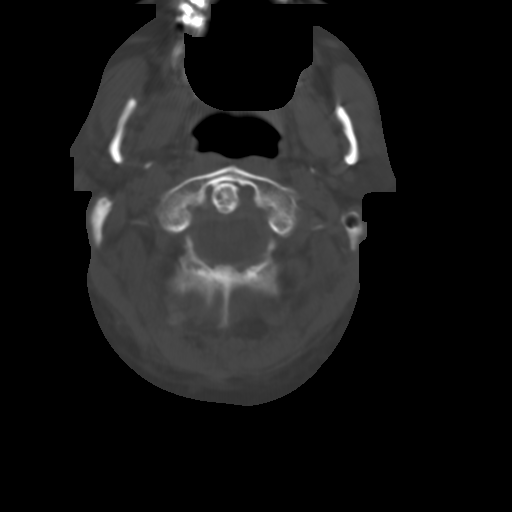
[im 4/36  brain]
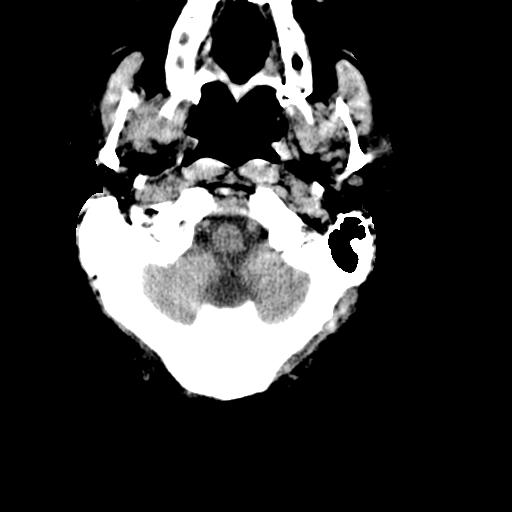
[im 7/36  brain]
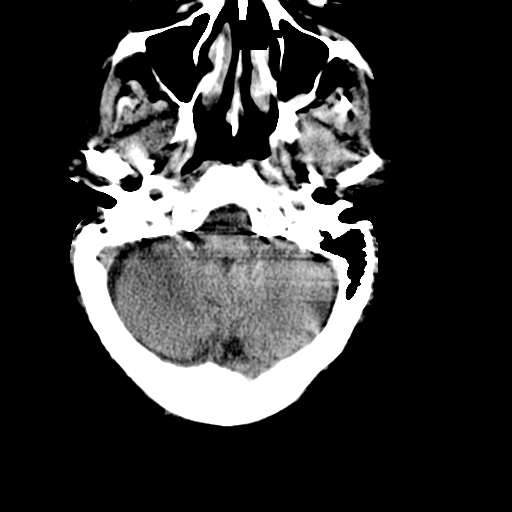
[im 9/36  brain]
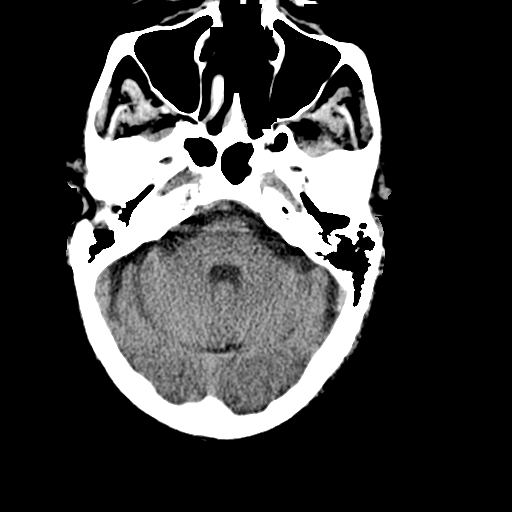
[im 10/36  brain]
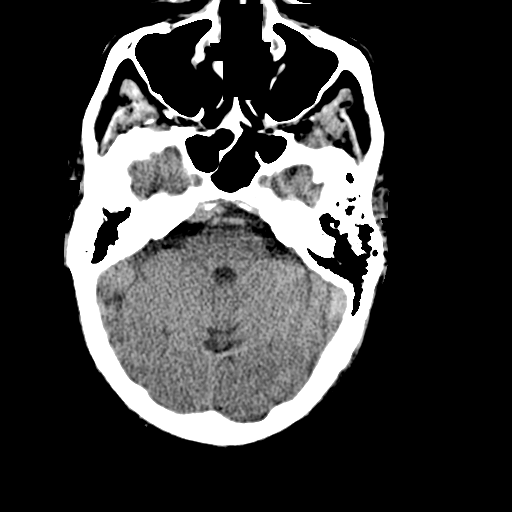
[im 10/36  bone]
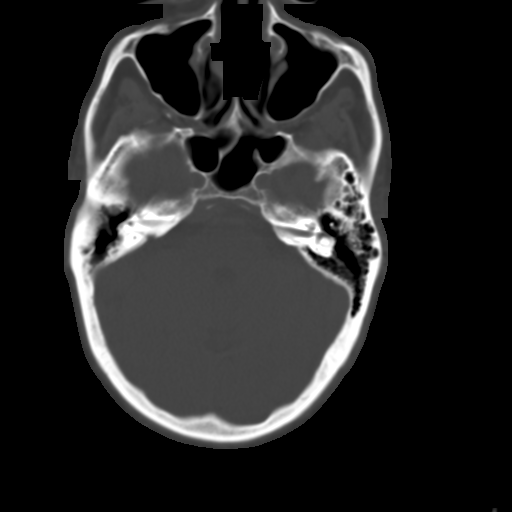
[im 13/36  brain]
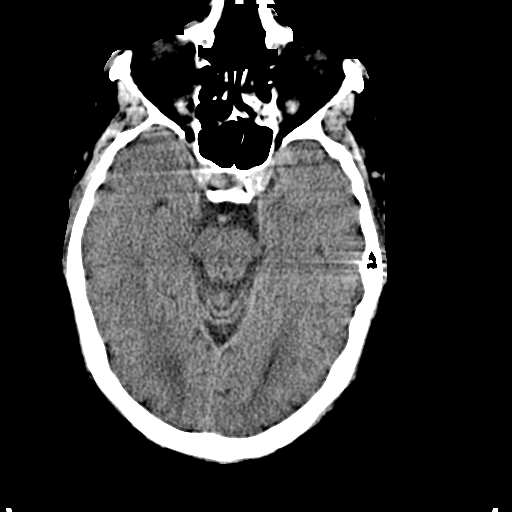
[im 15/36  brain]
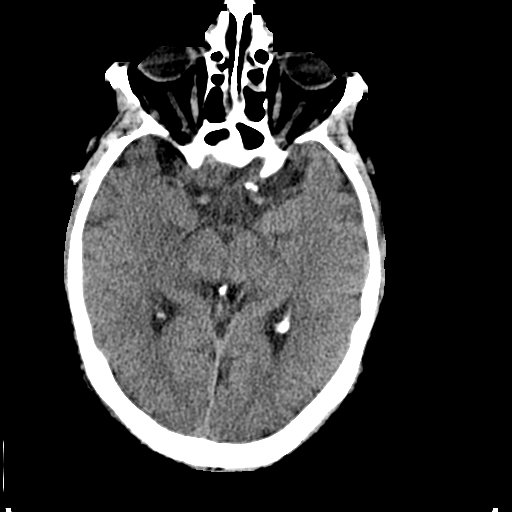
[im 17/36  brain]
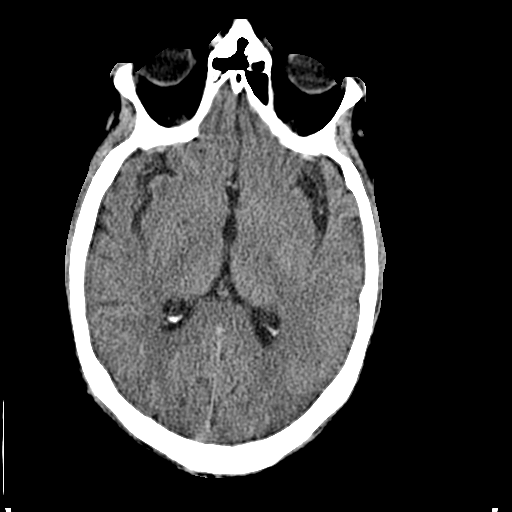
[im 19/36  brain]
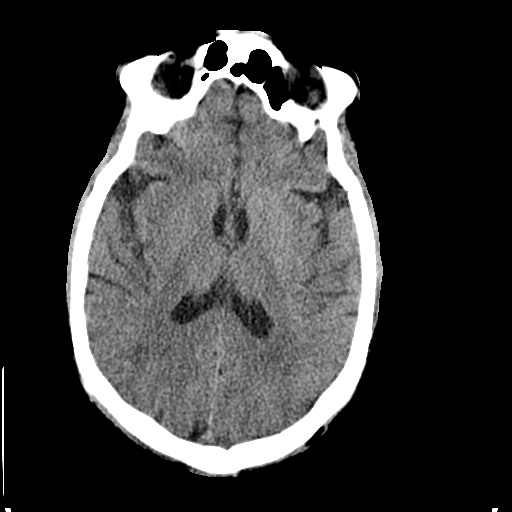
[im 19/36  bone]
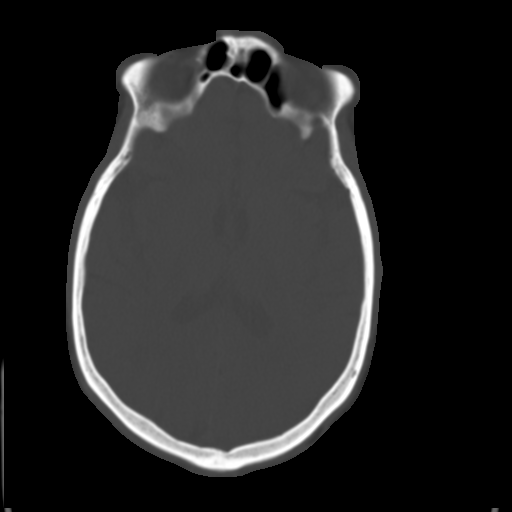
[im 21/36  brain]
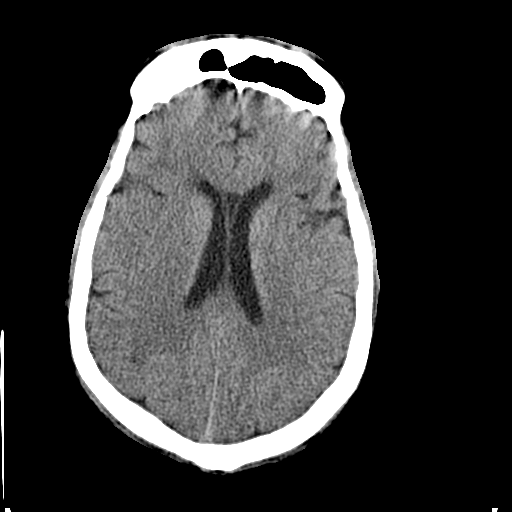
[im 23/36  brain]
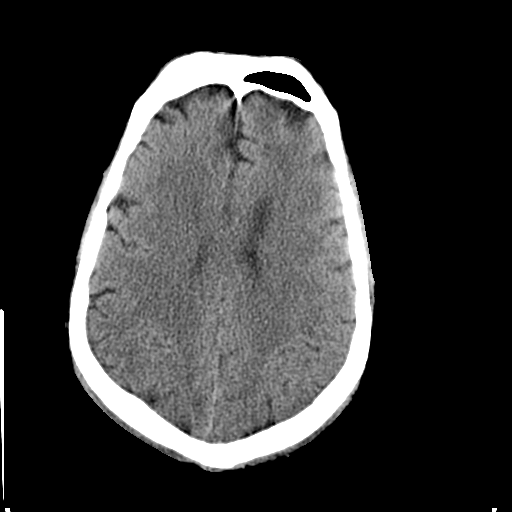
[im 26/36  brain]
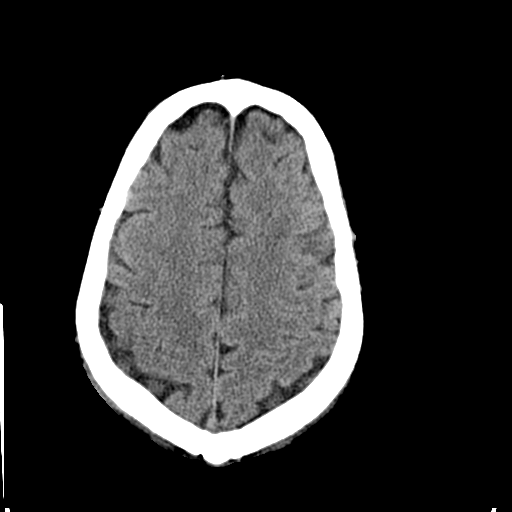
[im 27/36  brain]
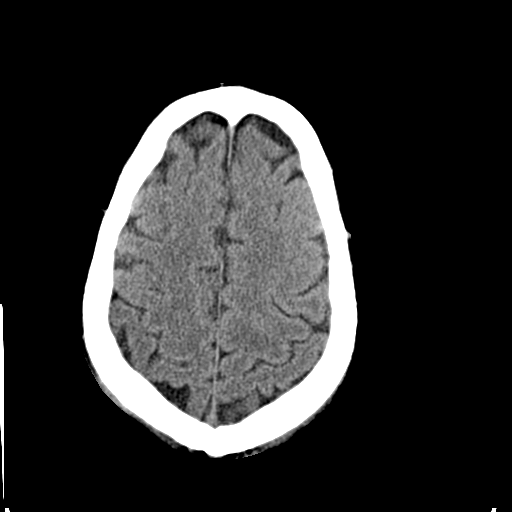
[im 27/36  bone]
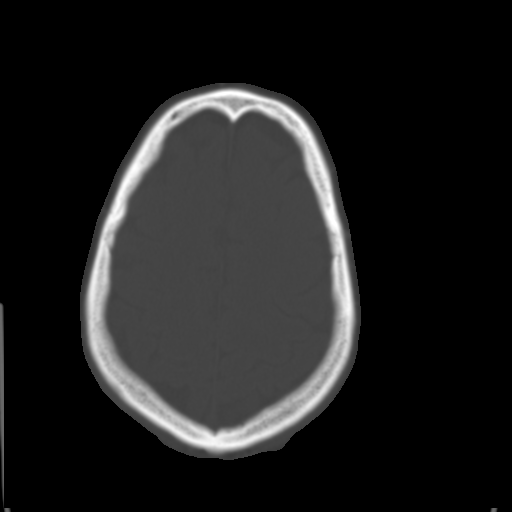
[im 29/36  brain]
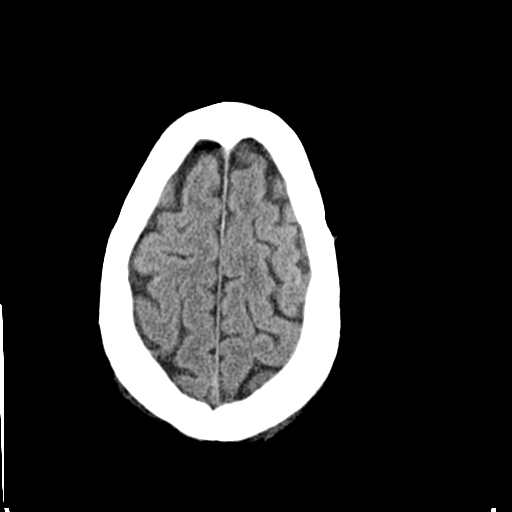
[im 32/36  brain]
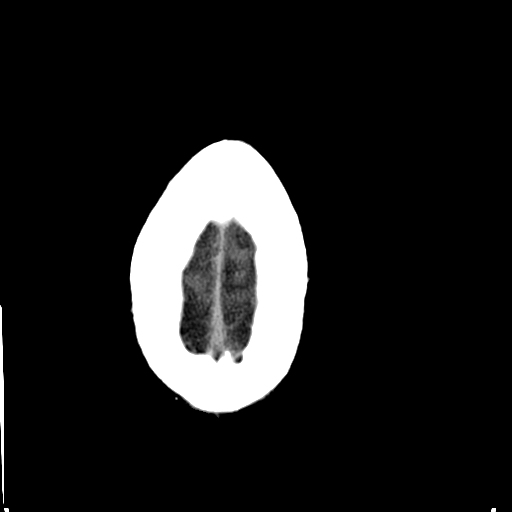
[im 34/36  brain]
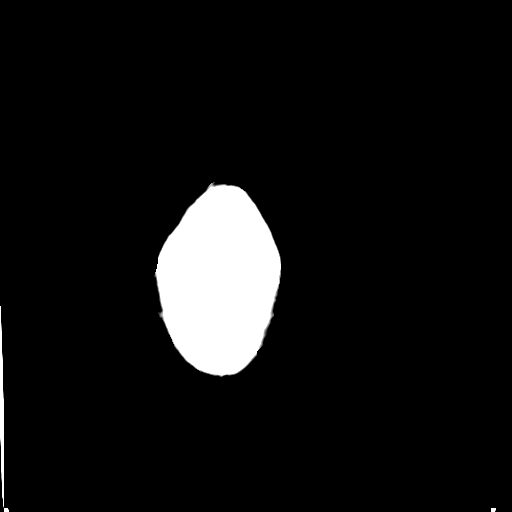

[16 of 30 positions shown; findings below may reference images not displayed]

FINDINGS: The ventricles are normal.  No extra-axial fluid
collections are seen.  No acute intracranial findings or mass
lesions.  The brainstem and cerebellum are unremarkable and stable.

The bony calvarium is intact.  There are chronic right mastoid
surgical changes and scattered ethmoid and maxillary sinus disease.
IMPRESSION: 1.  No acute intracranial findings or mass lesions.  No change
since prior head CT.
2.  Surgical changes involving the right mastoid region.

REF:G1 DICTATED: 04/08/2008 [DATE]

## 2010-04-19 IMAGING — CR DG CHEST 1V PORT
1 series · 1 of 1 positions shown · non-contrast
Comparison: 09/07/2007

CLINICAL DATA: Altered mental status

PORTABLE CHEST - 1 VIEW

[view not recorded]
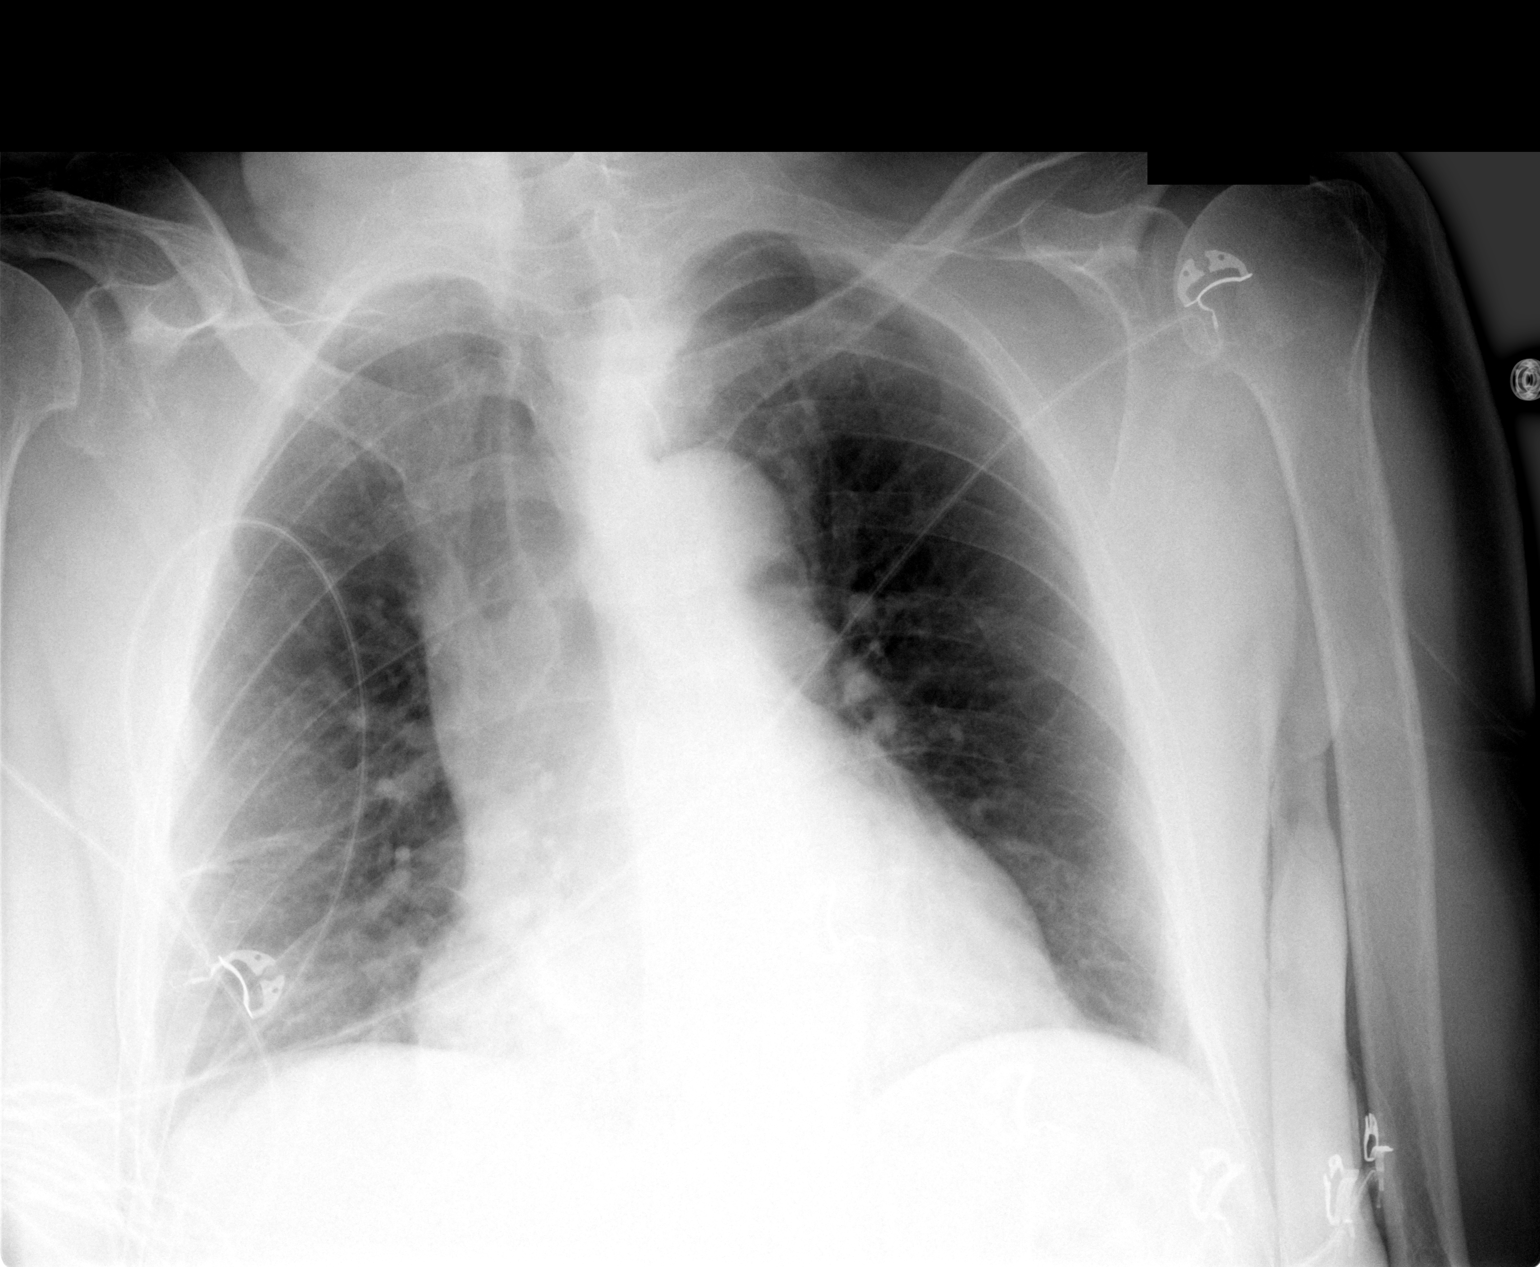

[1 of 1 positions shown; findings below may reference images not displayed]

FINDINGS: Heart size borderline enlarged considering AP projection.
No congestive heart failure.

There is a possible abnormal density at the right lung base.  This
could be an artifact of portable imaging.  Recommend careful
clinical correlation with consideration of a follow-up chest x-ray,
preferably PA and lateral.

No pleural fluid in one-view.
IMPRESSION: 1.  Borderline cardiomegaly - no congestive heart failure.
2.  Cannot rule out right lower lobe pneumonia - see above report.

## 2010-05-07 LAB — CBC
HCT: 28.9 % — ABNORMAL LOW (ref 39.0–52.0)
HCT: 29.4 % — ABNORMAL LOW (ref 39.0–52.0)
Hemoglobin: 8.9 g/dL — ABNORMAL LOW (ref 13.0–17.0)
Hemoglobin: 9.2 g/dL — ABNORMAL LOW (ref 13.0–17.0)
Hemoglobin: 9.8 g/dL — ABNORMAL LOW (ref 13.0–17.0)
MCH: 26.6 pg (ref 26.0–34.0)
MCH: 27 pg (ref 26.0–34.0)
MCH: 27.3 pg (ref 26.0–34.0)
MCHC: 30.8 g/dL (ref 30.0–36.0)
MCHC: 31.3 g/dL (ref 30.0–36.0)
MCV: 86.2 fL (ref 78.0–100.0)
Platelets: 292 10*3/uL (ref 150–400)
Platelets: 374 10*3/uL (ref 150–400)
RBC: 3.41 MIL/uL — ABNORMAL LOW (ref 4.22–5.81)
RBC: 3.59 MIL/uL — ABNORMAL LOW (ref 4.22–5.81)
RDW: 17.1 % — ABNORMAL HIGH (ref 11.5–15.5)
WBC: 12.1 10*3/uL — ABNORMAL HIGH (ref 4.0–10.5)

## 2010-05-07 LAB — GLUCOSE, CAPILLARY
Glucose-Capillary: 108 mg/dL — ABNORMAL HIGH (ref 70–99)
Glucose-Capillary: 115 mg/dL — ABNORMAL HIGH (ref 70–99)
Glucose-Capillary: 116 mg/dL — ABNORMAL HIGH (ref 70–99)
Glucose-Capillary: 120 mg/dL — ABNORMAL HIGH (ref 70–99)
Glucose-Capillary: 131 mg/dL — ABNORMAL HIGH (ref 70–99)
Glucose-Capillary: 97 mg/dL (ref 70–99)
Glucose-Capillary: 99 mg/dL (ref 70–99)

## 2010-05-07 LAB — URINALYSIS, ROUTINE W REFLEX MICROSCOPIC
Bilirubin Urine: NEGATIVE
Glucose, UA: NEGATIVE mg/dL
Hgb urine dipstick: NEGATIVE
Ketones, ur: NEGATIVE mg/dL
Nitrite: NEGATIVE
Protein, ur: NEGATIVE mg/dL
Specific Gravity, Urine: 1.015 (ref 1.005–1.030)
Urobilinogen, UA: 0.2 mg/dL (ref 0.0–1.0)
pH: 6 (ref 5.0–8.0)

## 2010-05-07 LAB — BASIC METABOLIC PANEL
BUN: 5 mg/dL — ABNORMAL LOW (ref 6–23)
CO2: 27 mEq/L (ref 19–32)
Glucose, Bld: 102 mg/dL — ABNORMAL HIGH (ref 70–99)
Potassium: 4.2 mEq/L (ref 3.5–5.1)
Sodium: 136 mEq/L (ref 135–145)

## 2010-05-07 LAB — COMPREHENSIVE METABOLIC PANEL
ALT: <8 U/L (ref 0–53)
AST: 16 U/L (ref 0–37)
Albumin: 2.4 g/dL — ABNORMAL LOW (ref 3.5–5.2)
Alkaline Phosphatase: 98 U/L (ref 39–117)
BUN: 8 mg/dL (ref 6–23)
CO2: 24 mEq/L (ref 19–32)
Calcium: 8.3 mg/dL — ABNORMAL LOW (ref 8.4–10.5)
Chloride: 106 mEq/L (ref 96–112)
Creatinine, Ser: 0.99 mg/dL (ref 0.4–1.5)
GFR calc Af Amer: >60 mL/min (ref >60)
GFR calc non Af Amer: >60 mL/min (ref >60)
Glucose, Bld: 87 mg/dL (ref 70–99)
Potassium: 4.1 mEq/L (ref 3.5–5.1)
Sodium: 138 mEq/L (ref 135–145)
Total Bilirubin: 0.8 mg/dL (ref 0.3–1.2)
Total Protein: 5.1 g/dL — ABNORMAL LOW (ref 6.0–8.3)

## 2010-05-07 LAB — PROTIME-INR
INR: 1.15 (ref 0.00–1.49)
Prothrombin Time: 14.9 seconds (ref 11.6–15.2)

## 2010-05-07 LAB — TYPE AND SCREEN
ABO/RH(D): A POS
Antibody Screen: NEGATIVE

## 2010-05-07 LAB — WOUND CULTURE

## 2010-05-07 LAB — MRSA PCR SCREENING: MRSA by PCR: NEGATIVE

## 2010-05-07 LAB — ANAEROBIC CULTURE

## 2010-05-07 LAB — APTT: aPTT: 28 seconds (ref 24–37)

## 2010-05-07 LAB — URINE CULTURE
Colony Count: NO GROWTH
Culture  Setup Time: 201108211315
Culture: NO GROWTH
Special Requests: NEGATIVE

## 2010-05-07 LAB — VANCOMYCIN, TROUGH: Vancomycin Tr: 14.8 ug/mL (ref 10.0–20.0)

## 2010-05-08 LAB — BASIC METABOLIC PANEL
BUN: 9 mg/dL (ref 6–23)
CO2: 23 mEq/L (ref 19–32)
Calcium: 8.3 mg/dL — ABNORMAL LOW (ref 8.4–10.5)
Chloride: 105 mEq/L (ref 96–112)
Creatinine, Ser: 1.1 mg/dL (ref 0.4–1.5)
GFR calc Af Amer: >60 mL/min (ref >60)
GFR calc non Af Amer: >60 mL/min (ref >60)
Glucose, Bld: 109 mg/dL — ABNORMAL HIGH (ref 70–99)
Potassium: 4.1 mEq/L (ref 3.5–5.1)
Sodium: 135 mEq/L (ref 135–145)

## 2010-05-08 LAB — CBC
HCT: 32.4 % — ABNORMAL LOW (ref 39.0–52.0)
Hemoglobin: 10.5 g/dL — ABNORMAL LOW (ref 13.0–17.0)
MCH: 28.1 pg (ref 26.0–34.0)
MCHC: 32.5 g/dL (ref 30.0–36.0)
MCV: 86.4 fL (ref 78.0–100.0)
Platelets: 294 10*3/uL (ref 150–400)
RBC: 3.75 MIL/uL — ABNORMAL LOW (ref 4.22–5.81)
RDW: 18.4 % — ABNORMAL HIGH (ref 11.5–15.5)
WBC: 14.7 10*3/uL — ABNORMAL HIGH (ref 4.0–10.5)

## 2010-05-08 LAB — CULTURE, BLOOD (ROUTINE X 2)

## 2010-05-08 LAB — DIFFERENTIAL
Basophils Absolute: 0.2 10*3/uL — ABNORMAL HIGH (ref 0.0–0.1)
Basophils Relative: 2 % — ABNORMAL HIGH (ref 0–1)
Eosinophils Absolute: 0.1 10*3/uL (ref 0.0–0.7)
Eosinophils Relative: 0 % (ref 0–5)
Lymphocytes Relative: 10 % — ABNORMAL LOW (ref 12–46)
Lymphs Abs: 1.4 10*3/uL (ref 0.7–4.0)
Monocytes Absolute: 1.3 10*3/uL — ABNORMAL HIGH (ref 0.1–1.0)
Monocytes Relative: 9 % (ref 3–12)
Neutro Abs: 11.7 10*3/uL — ABNORMAL HIGH (ref 1.7–7.7)
Neutrophils Relative %: 80 % — ABNORMAL HIGH (ref 43–77)

## 2010-05-09 LAB — BASIC METABOLIC PANEL
BUN: 18 mg/dL (ref 6–23)
BUN: 20 mg/dL (ref 6–23)
BUN: 24 mg/dL — ABNORMAL HIGH (ref 6–23)
BUN: 24 mg/dL — ABNORMAL HIGH (ref 6–23)
BUN: 24 mg/dL — ABNORMAL HIGH (ref 6–23)
BUN: 24 mg/dL — ABNORMAL HIGH (ref 6–23)
BUN: 25 mg/dL — ABNORMAL HIGH (ref 6–23)
BUN: 27 mg/dL — ABNORMAL HIGH (ref 6–23)
CO2: 25 mEq/L (ref 19–32)
CO2: 26 mEq/L (ref 19–32)
CO2: 26 mEq/L (ref 19–32)
CO2: 27 mEq/L (ref 19–32)
CO2: 27 mEq/L (ref 19–32)
CO2: 28 mEq/L (ref 19–32)
CO2: 29 mEq/L (ref 19–32)
CO2: 32 mEq/L (ref 19–32)
Calcium: 7.7 mg/dL — ABNORMAL LOW (ref 8.4–10.5)
Calcium: 7.8 mg/dL — ABNORMAL LOW (ref 8.4–10.5)
Calcium: 7.9 mg/dL — ABNORMAL LOW (ref 8.4–10.5)
Calcium: 7.9 mg/dL — ABNORMAL LOW (ref 8.4–10.5)
Calcium: 8 mg/dL — ABNORMAL LOW (ref 8.4–10.5)
Calcium: 8.1 mg/dL — ABNORMAL LOW (ref 8.4–10.5)
Calcium: 8.2 mg/dL — ABNORMAL LOW (ref 8.4–10.5)
Calcium: 8.6 mg/dL (ref 8.4–10.5)
Chloride: 103 mEq/L (ref 96–112)
Chloride: 103 mEq/L (ref 96–112)
Chloride: 103 mEq/L (ref 96–112)
Chloride: 105 mEq/L (ref 96–112)
Chloride: 106 mEq/L (ref 96–112)
Chloride: 108 mEq/L (ref 96–112)
Chloride: 98 mEq/L (ref 96–112)
Creatinine, Ser: 1.14 mg/dL (ref 0.4–1.5)
Creatinine, Ser: 1.18 mg/dL (ref 0.4–1.5)
Creatinine, Ser: 1.21 mg/dL (ref 0.4–1.5)
Creatinine, Ser: 1.26 mg/dL (ref 0.4–1.5)
Creatinine, Ser: 1.26 mg/dL (ref 0.4–1.5)
Creatinine, Ser: 1.29 mg/dL (ref 0.4–1.5)
Creatinine, Ser: 1.4 mg/dL (ref 0.4–1.5)
GFR calc Af Amer: >60 mL/min (ref >60)
GFR calc Af Amer: >60 mL/min (ref >60)
GFR calc Af Amer: >60 mL/min (ref >60)
GFR calc Af Amer: >60 mL/min (ref >60)
GFR calc Af Amer: >60 mL/min (ref >60)
GFR calc Af Amer: >60 mL/min (ref >60)
GFR calc Af Amer: >60 mL/min (ref >60)
GFR calc non Af Amer: 50 mL/min — ABNORMAL LOW (ref >60)
GFR calc non Af Amer: 55 mL/min — ABNORMAL LOW (ref >60)
GFR calc non Af Amer: 57 mL/min — ABNORMAL LOW (ref >60)
GFR calc non Af Amer: 57 mL/min — ABNORMAL LOW (ref >60)
GFR calc non Af Amer: 57 mL/min — ABNORMAL LOW (ref >60)
GFR calc non Af Amer: 60 mL/min — ABNORMAL LOW (ref >60)
GFR calc non Af Amer: >60 mL/min (ref >60)
GFR calc non Af Amer: >60 mL/min (ref >60)
Glucose, Bld: 109 mg/dL — ABNORMAL HIGH (ref 70–99)
Glucose, Bld: 116 mg/dL — ABNORMAL HIGH (ref 70–99)
Glucose, Bld: 118 mg/dL — ABNORMAL HIGH (ref 70–99)
Glucose, Bld: 127 mg/dL — ABNORMAL HIGH (ref 70–99)
Glucose, Bld: 133 mg/dL — ABNORMAL HIGH (ref 70–99)
Glucose, Bld: 149 mg/dL — ABNORMAL HIGH (ref 70–99)
Glucose, Bld: 89 mg/dL (ref 70–99)
Glucose, Bld: 91 mg/dL (ref 70–99)
Glucose, Bld: 98 mg/dL (ref 70–99)
Potassium: 3.8 mEq/L (ref 3.5–5.1)
Potassium: 3.8 mEq/L (ref 3.5–5.1)
Potassium: 3.9 mEq/L (ref 3.5–5.1)
Potassium: 4.1 mEq/L (ref 3.5–5.1)
Potassium: 4.2 mEq/L (ref 3.5–5.1)
Potassium: 4.3 mEq/L (ref 3.5–5.1)
Potassium: 4.4 mEq/L (ref 3.5–5.1)
Potassium: 4.5 mEq/L (ref 3.5–5.1)
Sodium: 131 mEq/L — ABNORMAL LOW (ref 135–145)
Sodium: 135 mEq/L (ref 135–145)
Sodium: 137 mEq/L (ref 135–145)
Sodium: 138 mEq/L (ref 135–145)
Sodium: 139 mEq/L (ref 135–145)
Sodium: 139 mEq/L (ref 135–145)
Sodium: 139 mEq/L (ref 135–145)
Sodium: 140 mEq/L (ref 135–145)

## 2010-05-09 LAB — GLUCOSE, CAPILLARY
Glucose-Capillary: 100 mg/dL — ABNORMAL HIGH (ref 70–99)
Glucose-Capillary: 108 mg/dL — ABNORMAL HIGH (ref 70–99)
Glucose-Capillary: 112 mg/dL — ABNORMAL HIGH (ref 70–99)
Glucose-Capillary: 113 mg/dL — ABNORMAL HIGH (ref 70–99)
Glucose-Capillary: 121 mg/dL — ABNORMAL HIGH (ref 70–99)
Glucose-Capillary: 132 mg/dL — ABNORMAL HIGH (ref 70–99)
Glucose-Capillary: 138 mg/dL — ABNORMAL HIGH (ref 70–99)
Glucose-Capillary: 140 mg/dL — ABNORMAL HIGH (ref 70–99)
Glucose-Capillary: 151 mg/dL — ABNORMAL HIGH (ref 70–99)
Glucose-Capillary: 156 mg/dL — ABNORMAL HIGH (ref 70–99)
Glucose-Capillary: 173 mg/dL — ABNORMAL HIGH (ref 70–99)
Glucose-Capillary: 177 mg/dL — ABNORMAL HIGH (ref 70–99)
Glucose-Capillary: 98 mg/dL (ref 70–99)

## 2010-05-09 LAB — COMPREHENSIVE METABOLIC PANEL
ALT: 36 U/L (ref 0–53)
ALT: 51 U/L (ref 0–53)
AST: 433 U/L — ABNORMAL HIGH (ref 0–37)
AST: 51 U/L — ABNORMAL HIGH (ref 0–37)
Albumin: 2.4 g/dL — ABNORMAL LOW (ref 3.5–5.2)
Albumin: 2.7 g/dL — ABNORMAL LOW (ref 3.5–5.2)
Alkaline Phosphatase: 101 U/L (ref 39–117)
Alkaline Phosphatase: 60 U/L (ref 39–117)
BUN: 24 mg/dL — ABNORMAL HIGH (ref 6–23)
BUN: 29 mg/dL — ABNORMAL HIGH (ref 6–23)
CO2: 29 mEq/L (ref 19–32)
CO2: 30 mEq/L (ref 19–32)
Calcium: 7.6 mg/dL — ABNORMAL LOW (ref 8.4–10.5)
Calcium: 7.9 mg/dL — ABNORMAL LOW (ref 8.4–10.5)
Chloride: 101 mEq/L (ref 96–112)
Chloride: 102 mEq/L (ref 96–112)
Creatinine, Ser: 1.31 mg/dL (ref 0.4–1.5)
Creatinine, Ser: 1.34 mg/dL (ref 0.4–1.5)
GFR calc Af Amer: >60 mL/min (ref >60)
GFR calc Af Amer: >60 mL/min (ref >60)
GFR calc non Af Amer: 53 mL/min — ABNORMAL LOW (ref >60)
GFR calc non Af Amer: 54 mL/min — ABNORMAL LOW (ref >60)
Glucose, Bld: 114 mg/dL — ABNORMAL HIGH (ref 70–99)
Glucose, Bld: 118 mg/dL — ABNORMAL HIGH (ref 70–99)
Potassium: 3.7 mEq/L (ref 3.5–5.1)
Potassium: 4.1 mEq/L (ref 3.5–5.1)
Sodium: 137 mEq/L (ref 135–145)
Sodium: 138 mEq/L (ref 135–145)
Total Bilirubin: 1.4 mg/dL — ABNORMAL HIGH (ref 0.3–1.2)
Total Bilirubin: 1.7 mg/dL — ABNORMAL HIGH (ref 0.3–1.2)
Total Protein: 4.4 g/dL — ABNORMAL LOW (ref 6.0–8.3)
Total Protein: 4.9 g/dL — ABNORMAL LOW (ref 6.0–8.3)

## 2010-05-09 LAB — SEDIMENTATION RATE: Sed Rate: 29 mm/hr — ABNORMAL HIGH (ref 0–16)

## 2010-05-09 LAB — CBC
HCT: 26.5 % — ABNORMAL LOW (ref 39.0–52.0)
HCT: 26.5 % — ABNORMAL LOW (ref 39.0–52.0)
HCT: 26.6 % — ABNORMAL LOW (ref 39.0–52.0)
HCT: 26.9 % — ABNORMAL LOW (ref 39.0–52.0)
HCT: 27 % — ABNORMAL LOW (ref 39.0–52.0)
HCT: 27.2 % — ABNORMAL LOW (ref 39.0–52.0)
HCT: 27.9 % — ABNORMAL LOW (ref 39.0–52.0)
HCT: 28 % — ABNORMAL LOW (ref 39.0–52.0)
HCT: 28.8 % — ABNORMAL LOW (ref 39.0–52.0)
HCT: 29.1 % — ABNORMAL LOW (ref 39.0–52.0)
HCT: 29.1 % — ABNORMAL LOW (ref 39.0–52.0)
Hemoglobin: 8.6 g/dL — ABNORMAL LOW (ref 13.0–17.0)
Hemoglobin: 8.6 g/dL — ABNORMAL LOW (ref 13.0–17.0)
Hemoglobin: 8.7 g/dL — ABNORMAL LOW (ref 13.0–17.0)
Hemoglobin: 8.8 g/dL — ABNORMAL LOW (ref 13.0–17.0)
Hemoglobin: 8.9 g/dL — ABNORMAL LOW (ref 13.0–17.0)
Hemoglobin: 9.1 g/dL — ABNORMAL LOW (ref 13.0–17.0)
Hemoglobin: 9.1 g/dL — ABNORMAL LOW (ref 13.0–17.0)
Hemoglobin: 9.3 g/dL — ABNORMAL LOW (ref 13.0–17.0)
Hemoglobin: 9.5 g/dL — ABNORMAL LOW (ref 13.0–17.0)
Hemoglobin: 9.6 g/dL — ABNORMAL LOW (ref 13.0–17.0)
MCH: 28.3 pg (ref 26.0–34.0)
MCH: 28.3 pg (ref 26.0–34.0)
MCH: 28.3 pg (ref 26.0–34.0)
MCH: 28.4 pg (ref 26.0–34.0)
MCH: 28.4 pg (ref 26.0–34.0)
MCH: 28.4 pg (ref 26.0–34.0)
MCH: 28.5 pg (ref 26.0–34.0)
MCH: 28.5 pg (ref 26.0–34.0)
MCH: 28.6 pg (ref 26.0–34.0)
MCH: 28.7 pg (ref 26.0–34.0)
MCH: 29 pg (ref 26.0–34.0)
MCHC: 32.4 g/dL (ref 30.0–36.0)
MCHC: 32.4 g/dL (ref 30.0–36.0)
MCHC: 32.5 g/dL (ref 30.0–36.0)
MCHC: 32.5 g/dL (ref 30.0–36.0)
MCHC: 32.6 g/dL (ref 30.0–36.0)
MCHC: 32.6 g/dL (ref 30.0–36.0)
MCHC: 32.7 g/dL (ref 30.0–36.0)
MCHC: 32.7 g/dL (ref 30.0–36.0)
MCHC: 32.8 g/dL (ref 30.0–36.0)
MCHC: 32.9 g/dL (ref 30.0–36.0)
MCHC: 33.1 g/dL (ref 30.0–36.0)
MCV: 86.7 fL (ref 78.0–100.0)
MCV: 87 fL (ref 78.0–100.0)
MCV: 87.1 fL (ref 78.0–100.0)
MCV: 87.2 fL (ref 78.0–100.0)
MCV: 87.2 fL (ref 78.0–100.0)
MCV: 87.2 fL (ref 78.0–100.0)
MCV: 87.3 fL (ref 78.0–100.0)
MCV: 87.5 fL (ref 78.0–100.0)
MCV: 87.5 fL (ref 78.0–100.0)
MCV: 87.5 fL (ref 78.0–100.0)
MCV: 87.7 fL (ref 78.0–100.0)
Platelets: 144 10*3/uL — ABNORMAL LOW (ref 150–400)
Platelets: 166 10*3/uL (ref 150–400)
Platelets: 246 10*3/uL (ref 150–400)
Platelets: 272 10*3/uL (ref 150–400)
Platelets: 349 10*3/uL (ref 150–400)
Platelets: 386 10*3/uL (ref 150–400)
Platelets: 404 10*3/uL — ABNORMAL HIGH (ref 150–400)
Platelets: 424 10*3/uL — ABNORMAL HIGH (ref 150–400)
Platelets: 445 10*3/uL — ABNORMAL HIGH (ref 150–400)
Platelets: 479 10*3/uL — ABNORMAL HIGH (ref 150–400)
RBC: 3.03 MIL/uL — ABNORMAL LOW (ref 4.22–5.81)
RBC: 3.04 MIL/uL — ABNORMAL LOW (ref 4.22–5.81)
RBC: 3.06 MIL/uL — ABNORMAL LOW (ref 4.22–5.81)
RBC: 3.1 MIL/uL — ABNORMAL LOW (ref 4.22–5.81)
RBC: 3.11 MIL/uL — ABNORMAL LOW (ref 4.22–5.81)
RBC: 3.21 MIL/uL — ABNORMAL LOW (ref 4.22–5.81)
RBC: 3.22 MIL/uL — ABNORMAL LOW (ref 4.22–5.81)
RBC: 3.3 MIL/uL — ABNORMAL LOW (ref 4.22–5.81)
RBC: 3.33 MIL/uL — ABNORMAL LOW (ref 4.22–5.81)
RBC: 3.34 MIL/uL — ABNORMAL LOW (ref 4.22–5.81)
RDW: 17.4 % — ABNORMAL HIGH (ref 11.5–15.5)
RDW: 17.5 % — ABNORMAL HIGH (ref 11.5–15.5)
RDW: 17.5 % — ABNORMAL HIGH (ref 11.5–15.5)
RDW: 17.5 % — ABNORMAL HIGH (ref 11.5–15.5)
RDW: 17.7 % — ABNORMAL HIGH (ref 11.5–15.5)
RDW: 17.9 % — ABNORMAL HIGH (ref 11.5–15.5)
RDW: 18.1 % — ABNORMAL HIGH (ref 11.5–15.5)
RDW: 18.1 % — ABNORMAL HIGH (ref 11.5–15.5)
RDW: 18.5 % — ABNORMAL HIGH (ref 11.5–15.5)
RDW: 18.6 % — ABNORMAL HIGH (ref 11.5–15.5)
RDW: 18.6 % — ABNORMAL HIGH (ref 11.5–15.5)
WBC: 11.5 10*3/uL — ABNORMAL HIGH (ref 4.0–10.5)
WBC: 12.9 10*3/uL — ABNORMAL HIGH (ref 4.0–10.5)
WBC: 13.3 10*3/uL — ABNORMAL HIGH (ref 4.0–10.5)
WBC: 14.5 10*3/uL — ABNORMAL HIGH (ref 4.0–10.5)
WBC: 16.2 10*3/uL — ABNORMAL HIGH (ref 4.0–10.5)
WBC: 16.3 10*3/uL — ABNORMAL HIGH (ref 4.0–10.5)
WBC: 16.9 10*3/uL — ABNORMAL HIGH (ref 4.0–10.5)
WBC: 17 10*3/uL — ABNORMAL HIGH (ref 4.0–10.5)
WBC: 18.8 10*3/uL — ABNORMAL HIGH (ref 4.0–10.5)
WBC: 26 10*3/uL — ABNORMAL HIGH (ref 4.0–10.5)

## 2010-05-09 LAB — URINALYSIS, ROUTINE W REFLEX MICROSCOPIC
Bilirubin Urine: NEGATIVE
Bilirubin Urine: NEGATIVE
Glucose, UA: NEGATIVE mg/dL
Glucose, UA: NEGATIVE mg/dL
Hgb urine dipstick: NEGATIVE
Ketones, ur: NEGATIVE mg/dL
Ketones, ur: NEGATIVE mg/dL
Leukocytes, UA: NEGATIVE
Nitrite: NEGATIVE
Nitrite: NEGATIVE
Protein, ur: NEGATIVE mg/dL
Protein, ur: NEGATIVE mg/dL
Specific Gravity, Urine: 1.014 (ref 1.005–1.030)
Specific Gravity, Urine: 1.019 (ref 1.005–1.030)
Urobilinogen, UA: 0.2 mg/dL (ref 0.0–1.0)
Urobilinogen, UA: 1 mg/dL (ref 0.0–1.0)
pH: 6 (ref 5.0–8.0)
pH: 6 (ref 5.0–8.0)

## 2010-05-09 LAB — URINE CULTURE: Colony Count: 60000

## 2010-05-09 LAB — POCT I-STAT 4, (NA,K, GLUC, HGB,HCT)
Glucose, Bld: 133 mg/dL — ABNORMAL HIGH (ref 70–99)
HCT: 32 % — ABNORMAL LOW (ref 39.0–52.0)

## 2010-05-09 LAB — POCT I-STAT, CHEM 8
BUN: 32 mg/dL — ABNORMAL HIGH (ref 6–23)
Creatinine, Ser: 1.6 mg/dL — ABNORMAL HIGH (ref 0.4–1.5)
Potassium: 3.3 mEq/L — ABNORMAL LOW (ref 3.5–5.1)
Sodium: 138 mEq/L (ref 135–145)

## 2010-05-09 LAB — BODY FLUID CULTURE
Culture: NO GROWTH
Special Requests: 3

## 2010-05-09 LAB — DIGOXIN LEVEL: Digoxin Level: 0.5 ng/mL — ABNORMAL LOW (ref 0.8–2.0)

## 2010-05-09 LAB — PROCALCITONIN: Procalcitonin: <0.5 ng/mL

## 2010-05-09 LAB — URINE MICROSCOPIC-ADD ON

## 2010-05-09 LAB — BRAIN NATRIURETIC PEPTIDE: Pro B Natriuretic peptide (BNP): 611 pg/mL — ABNORMAL HIGH (ref 0.0–100.0)

## 2010-05-09 LAB — CORTISOL: Cortisol, Plasma: 18.9 ug/dL

## 2010-05-10 LAB — CROSSMATCH
ABO/RH(D): A POS
Antibody Screen: NEGATIVE

## 2010-05-10 LAB — BLOOD GAS, ARTERIAL
Acid-Base Excess: 1.1 mmol/L (ref 0.0–2.0)
Bicarbonate: 24.6 mEq/L — ABNORMAL HIGH (ref 20.0–24.0)
O2 Saturation: 94.1 %
Patient temperature: 98.6
TCO2: 25.7 mmol/L (ref 0–100)
pCO2 arterial: 35.2 mmHg (ref 35.0–45.0)
pH, Arterial: 7.459 — ABNORMAL HIGH (ref 7.350–7.450)
pO2, Arterial: 61.2 mmHg — ABNORMAL LOW (ref 80.0–100.0)

## 2010-05-10 LAB — CREATININE, SERUM
Creatinine, Ser: 1.11 mg/dL (ref 0.4–1.5)
Creatinine, Ser: 1.27 mg/dL (ref 0.4–1.5)
GFR calc Af Amer: >60 mL/min (ref >60)
GFR calc Af Amer: >60 mL/min (ref >60)
GFR calc non Af Amer: 56 mL/min — ABNORMAL LOW (ref >60)
GFR calc non Af Amer: >60 mL/min (ref >60)

## 2010-05-10 LAB — CBC
HCT: 24.6 % — ABNORMAL LOW (ref 39.0–52.0)
HCT: 26.8 % — ABNORMAL LOW (ref 39.0–52.0)
HCT: 28.7 % — ABNORMAL LOW (ref 39.0–52.0)
HCT: 28.9 % — ABNORMAL LOW (ref 39.0–52.0)
HCT: 29.6 % — ABNORMAL LOW (ref 39.0–52.0)
HCT: 31.5 % — ABNORMAL LOW (ref 39.0–52.0)
HCT: 31.7 % — ABNORMAL LOW (ref 39.0–52.0)
HCT: 32.2 % — ABNORMAL LOW (ref 39.0–52.0)
HCT: 32.2 % — ABNORMAL LOW (ref 39.0–52.0)
HCT: 33.5 % — ABNORMAL LOW (ref 39.0–52.0)
HCT: 34.1 % — ABNORMAL LOW (ref 39.0–52.0)
HCT: 34.2 % — ABNORMAL LOW (ref 39.0–52.0)
HCT: 37.2 % — ABNORMAL LOW (ref 39.0–52.0)
Hemoglobin: 10.2 g/dL — ABNORMAL LOW (ref 13.0–17.0)
Hemoglobin: 10.3 g/dL — ABNORMAL LOW (ref 13.0–17.0)
Hemoglobin: 10.5 g/dL — ABNORMAL LOW (ref 13.0–17.0)
Hemoglobin: 10.6 g/dL — ABNORMAL LOW (ref 13.0–17.0)
Hemoglobin: 10.6 g/dL — ABNORMAL LOW (ref 13.0–17.0)
Hemoglobin: 11.1 g/dL — ABNORMAL LOW (ref 13.0–17.0)
Hemoglobin: 12 g/dL — ABNORMAL LOW (ref 13.0–17.0)
Hemoglobin: 7.8 g/dL — ABNORMAL LOW (ref 13.0–17.0)
Hemoglobin: 8.8 g/dL — ABNORMAL LOW (ref 13.0–17.0)
Hemoglobin: 9.4 g/dL — ABNORMAL LOW (ref 13.0–17.0)
Hemoglobin: 9.4 g/dL — ABNORMAL LOW (ref 13.0–17.0)
Hemoglobin: 9.7 g/dL — ABNORMAL LOW (ref 13.0–17.0)
MCH: 27.4 pg (ref 26.0–34.0)
MCH: 27.5 pg (ref 26.0–34.0)
MCH: 27.6 pg (ref 26.0–34.0)
MCH: 28.1 pg (ref 26.0–34.0)
MCH: 28.2 pg (ref 26.0–34.0)
MCH: 28.2 pg (ref 26.0–34.0)
MCH: 28.3 pg (ref 26.0–34.0)
MCH: 28.4 pg (ref 26.0–34.0)
MCH: 28.5 pg (ref 26.0–34.0)
MCHC: 32.2 g/dL (ref 30.0–36.0)
MCHC: 32.2 g/dL (ref 30.0–36.0)
MCHC: 32.4 g/dL (ref 30.0–36.0)
MCHC: 32.4 g/dL (ref 30.0–36.0)
MCHC: 32.5 g/dL (ref 30.0–36.0)
MCHC: 32.6 g/dL (ref 30.0–36.0)
MCHC: 32.6 g/dL (ref 30.0–36.0)
MCHC: 32.8 g/dL (ref 30.0–36.0)
MCHC: 32.8 g/dL (ref 30.0–36.0)
MCHC: 32.9 g/dL (ref 30.0–36.0)
MCV: 83.5 fL (ref 78.0–100.0)
MCV: 83.9 fL (ref 78.0–100.0)
MCV: 84.6 fL (ref 78.0–100.0)
MCV: 84.8 fL (ref 78.0–100.0)
MCV: 85 fL (ref 78.0–100.0)
MCV: 85.2 fL (ref 78.0–100.0)
MCV: 85.8 fL (ref 78.0–100.0)
MCV: 86.6 fL (ref 78.0–100.0)
MCV: 86.7 fL (ref 78.0–100.0)
MCV: 86.7 fL (ref 78.0–100.0)
MCV: 86.9 fL (ref 78.0–100.0)
MCV: 87.3 fL (ref 78.0–100.0)
Platelets: 105 10*3/uL — ABNORMAL LOW (ref 150–400)
Platelets: 110 10*3/uL — ABNORMAL LOW (ref 150–400)
Platelets: 121 10*3/uL — ABNORMAL LOW (ref 150–400)
Platelets: 132 10*3/uL — ABNORMAL LOW (ref 150–400)
Platelets: 142 10*3/uL — ABNORMAL LOW (ref 150–400)
Platelets: 151 10*3/uL (ref 150–400)
Platelets: 153 10*3/uL (ref 150–400)
Platelets: 184 10*3/uL (ref 150–400)
Platelets: 196 10*3/uL (ref 150–400)
Platelets: 210 10*3/uL (ref 150–400)
RBC: 2.83 MIL/uL — ABNORMAL LOW (ref 4.22–5.81)
RBC: 3.09 MIL/uL — ABNORMAL LOW (ref 4.22–5.81)
RBC: 3.3 MIL/uL — ABNORMAL LOW (ref 4.22–5.81)
RBC: 3.31 MIL/uL — ABNORMAL LOW (ref 4.22–5.81)
RBC: 3.42 MIL/uL — ABNORMAL LOW (ref 4.22–5.81)
RBC: 3.74 MIL/uL — ABNORMAL LOW (ref 4.22–5.81)
RBC: 3.76 MIL/uL — ABNORMAL LOW (ref 4.22–5.81)
RBC: 3.81 MIL/uL — ABNORMAL LOW (ref 4.22–5.81)
RBC: 4.02 MIL/uL — ABNORMAL LOW (ref 4.22–5.81)
RBC: 4.36 MIL/uL (ref 4.22–5.81)
RDW: 16 % — ABNORMAL HIGH (ref 11.5–15.5)
RDW: 16.3 % — ABNORMAL HIGH (ref 11.5–15.5)
RDW: 16.3 % — ABNORMAL HIGH (ref 11.5–15.5)
RDW: 16.4 % — ABNORMAL HIGH (ref 11.5–15.5)
RDW: 16.4 % — ABNORMAL HIGH (ref 11.5–15.5)
RDW: 16.5 % — ABNORMAL HIGH (ref 11.5–15.5)
RDW: 16.5 % — ABNORMAL HIGH (ref 11.5–15.5)
RDW: 16.5 % — ABNORMAL HIGH (ref 11.5–15.5)
RDW: 16.5 % — ABNORMAL HIGH (ref 11.5–15.5)
RDW: 16.8 % — ABNORMAL HIGH (ref 11.5–15.5)
RDW: 16.9 % — ABNORMAL HIGH (ref 11.5–15.5)
WBC: 11 10*3/uL — ABNORMAL HIGH (ref 4.0–10.5)
WBC: 14.2 10*3/uL — ABNORMAL HIGH (ref 4.0–10.5)
WBC: 15.9 10*3/uL — ABNORMAL HIGH (ref 4.0–10.5)
WBC: 16.7 10*3/uL — ABNORMAL HIGH (ref 4.0–10.5)
WBC: 17 10*3/uL — ABNORMAL HIGH (ref 4.0–10.5)
WBC: 17.2 10*3/uL — ABNORMAL HIGH (ref 4.0–10.5)
WBC: 18 10*3/uL — ABNORMAL HIGH (ref 4.0–10.5)
WBC: 7.5 10*3/uL (ref 4.0–10.5)
WBC: 7.7 10*3/uL (ref 4.0–10.5)
WBC: 8 10*3/uL (ref 4.0–10.5)
WBC: 8.1 10*3/uL (ref 4.0–10.5)
WBC: 9.7 10*3/uL (ref 4.0–10.5)

## 2010-05-10 LAB — COMPREHENSIVE METABOLIC PANEL
ALT: 13 U/L (ref 0–53)
ALT: 84 U/L — ABNORMAL HIGH (ref 0–53)
AST: 112 U/L — ABNORMAL HIGH (ref 0–37)
AST: 34 U/L (ref 0–37)
Albumin: 2.9 g/dL — ABNORMAL LOW (ref 3.5–5.2)
Albumin: 3.3 g/dL — ABNORMAL LOW (ref 3.5–5.2)
Alkaline Phosphatase: 55 U/L (ref 39–117)
Alkaline Phosphatase: 71 U/L (ref 39–117)
Alkaline Phosphatase: 71 U/L (ref 39–117)
BUN: 10 mg/dL (ref 6–23)
BUN: 15 mg/dL (ref 6–23)
BUN: 18 mg/dL (ref 6–23)
CO2: 24 mEq/L (ref 19–32)
CO2: 27 mEq/L (ref 19–32)
CO2: 31 mEq/L (ref 19–32)
Calcium: 8 mg/dL — ABNORMAL LOW (ref 8.4–10.5)
Calcium: 8.6 mg/dL (ref 8.4–10.5)
Chloride: 102 mEq/L (ref 96–112)
Chloride: 105 mEq/L (ref 96–112)
Chloride: 107 mEq/L (ref 96–112)
Creatinine, Ser: 1.22 mg/dL (ref 0.4–1.5)
Creatinine, Ser: 1.24 mg/dL (ref 0.4–1.5)
GFR calc Af Amer: >60 mL/min (ref >60)
GFR calc Af Amer: >60 mL/min (ref >60)
GFR calc non Af Amer: 50 mL/min — ABNORMAL LOW (ref >60)
GFR calc non Af Amer: 58 mL/min — ABNORMAL LOW (ref >60)
GFR calc non Af Amer: 59 mL/min — ABNORMAL LOW (ref >60)
Glucose, Bld: 101 mg/dL — ABNORMAL HIGH (ref 70–99)
Glucose, Bld: 125 mg/dL — ABNORMAL HIGH (ref 70–99)
Glucose, Bld: 97 mg/dL (ref 70–99)
Potassium: 3.5 mEq/L (ref 3.5–5.1)
Potassium: 3.7 mEq/L (ref 3.5–5.1)
Potassium: 4.4 mEq/L (ref 3.5–5.1)
Sodium: 140 mEq/L (ref 135–145)
Sodium: 141 mEq/L (ref 135–145)
Total Bilirubin: 0.6 mg/dL (ref 0.3–1.2)
Total Bilirubin: 0.7 mg/dL (ref 0.3–1.2)
Total Bilirubin: 1.6 mg/dL — ABNORMAL HIGH (ref 0.3–1.2)
Total Protein: 5.2 g/dL — ABNORMAL LOW (ref 6.0–8.3)
Total Protein: 5.9 g/dL — ABNORMAL LOW (ref 6.0–8.3)
Total Protein: 6.2 g/dL (ref 6.0–8.3)

## 2010-05-10 LAB — PREPARE FRESH FROZEN PLASMA

## 2010-05-10 LAB — POCT I-STAT 4, (NA,K, GLUC, HGB,HCT)
Glucose, Bld: 102 mg/dL — ABNORMAL HIGH (ref 70–99)
Glucose, Bld: 114 mg/dL — ABNORMAL HIGH (ref 70–99)
Glucose, Bld: 116 mg/dL — ABNORMAL HIGH (ref 70–99)
HCT: 20 % — ABNORMAL LOW (ref 39.0–52.0)
HCT: 23 % — ABNORMAL LOW (ref 39.0–52.0)
HCT: 23 % — ABNORMAL LOW (ref 39.0–52.0)
Hemoglobin: 10.5 g/dL — ABNORMAL LOW (ref 13.0–17.0)
Hemoglobin: 11.2 g/dL — ABNORMAL LOW (ref 13.0–17.0)
Hemoglobin: 6.8 g/dL — CL (ref 13.0–17.0)
Hemoglobin: 7.8 g/dL — ABNORMAL LOW (ref 13.0–17.0)
Hemoglobin: 7.8 g/dL — ABNORMAL LOW (ref 13.0–17.0)
Potassium: 3.6 mEq/L (ref 3.5–5.1)
Potassium: 3.6 mEq/L (ref 3.5–5.1)
Potassium: 4.4 mEq/L (ref 3.5–5.1)
Sodium: 137 mEq/L (ref 135–145)
Sodium: 142 mEq/L (ref 135–145)

## 2010-05-10 LAB — POCT I-STAT 3, ART BLOOD GAS (G3+)
Acid-Base Excess: 1 mmol/L (ref 0.0–2.0)
Acid-base deficit: 2 mmol/L (ref 0.0–2.0)
Acid-base deficit: 2 mmol/L (ref 0.0–2.0)
Acid-base deficit: 3 mmol/L — ABNORMAL HIGH (ref 0.0–2.0)
Acid-base deficit: 3 mmol/L — ABNORMAL HIGH (ref 0.0–2.0)
Bicarbonate: 20.3 mEq/L (ref 20.0–24.0)
Bicarbonate: 22.6 mEq/L (ref 20.0–24.0)
Bicarbonate: 23.4 mEq/L (ref 20.0–24.0)
Bicarbonate: 23.4 mEq/L (ref 20.0–24.0)
Bicarbonate: 23.7 mEq/L (ref 20.0–24.0)
O2 Saturation: 100 %
O2 Saturation: 89 %
O2 Saturation: 90 %
O2 Saturation: 92 %
O2 Saturation: 96 %
Patient temperature: 37.1
Patient temperature: 37.6
Patient temperature: 98.6
TCO2: 24 mmol/L (ref 0–100)
TCO2: 25 mmol/L (ref 0–100)
TCO2: 25 mmol/L (ref 0–100)
TCO2: 25 mmol/L (ref 0–100)
pCO2 arterial: 39.2 mmHg (ref 35.0–45.0)
pCO2 arterial: 39.5 mmHg (ref 35.0–45.0)
pCO2 arterial: 40.1 mmHg (ref 35.0–45.0)
pCO2 arterial: 41.6 mmHg (ref 35.0–45.0)
pH, Arterial: 7.306 — ABNORMAL LOW (ref 7.350–7.450)
pH, Arterial: 7.369 (ref 7.350–7.450)
pH, Arterial: 7.414 (ref 7.350–7.450)
pO2, Arterial: 245 mmHg — ABNORMAL HIGH (ref 80.0–100.0)
pO2, Arterial: 54 mmHg — ABNORMAL LOW (ref 80.0–100.0)
pO2, Arterial: 57 mmHg — ABNORMAL LOW (ref 80.0–100.0)
pO2, Arterial: 66 mmHg — ABNORMAL LOW (ref 80.0–100.0)
pO2, Arterial: 82 mmHg (ref 80.0–100.0)

## 2010-05-10 LAB — POCT I-STAT, CHEM 8
BUN: 11 mg/dL (ref 6–23)
BUN: 16 mg/dL (ref 6–23)
Calcium, Ion: 1.07 mmol/L — ABNORMAL LOW (ref 1.12–1.32)
Calcium, Ion: 1.08 mmol/L — ABNORMAL LOW (ref 1.12–1.32)
Calcium, Ion: 1.1 mmol/L — ABNORMAL LOW (ref 1.12–1.32)
Calcium, Ion: 1.11 mmol/L — ABNORMAL LOW (ref 1.12–1.32)
Chloride: 106 mEq/L (ref 96–112)
Chloride: 109 mEq/L (ref 96–112)
Creatinine, Ser: 1.4 mg/dL (ref 0.4–1.5)
Creatinine, Ser: 1.4 mg/dL (ref 0.4–1.5)
Glucose, Bld: 113 mg/dL — ABNORMAL HIGH (ref 70–99)
Glucose, Bld: 115 mg/dL — ABNORMAL HIGH (ref 70–99)
Glucose, Bld: 119 mg/dL — ABNORMAL HIGH (ref 70–99)
Glucose, Bld: 145 mg/dL — ABNORMAL HIGH (ref 70–99)
HCT: 32 % — ABNORMAL LOW (ref 39.0–52.0)
HCT: 34 % — ABNORMAL LOW (ref 39.0–52.0)
HCT: 35 % — ABNORMAL LOW (ref 39.0–52.0)
Hemoglobin: 10.2 g/dL — ABNORMAL LOW (ref 13.0–17.0)
Hemoglobin: 11.9 g/dL — ABNORMAL LOW (ref 13.0–17.0)
Potassium: 4.3 meq/L (ref 3.5–5.1)
Sodium: 139 mEq/L (ref 135–145)
Sodium: 140 mEq/L (ref 135–145)
Sodium: 141 mEq/L (ref 135–145)
TCO2: 23 mmol/L (ref 0–100)
TCO2: 23 mmol/L (ref 0–100)
TCO2: 23 mmol/L (ref 0–100)
TCO2: 27 mmol/L (ref 0–100)
TCO2: 27 mmol/L (ref 0–100)

## 2010-05-10 LAB — URINALYSIS, ROUTINE W REFLEX MICROSCOPIC
Bilirubin Urine: NEGATIVE
Glucose, UA: NEGATIVE mg/dL
Hgb urine dipstick: NEGATIVE
Ketones, ur: NEGATIVE mg/dL
Nitrite: NEGATIVE
Protein, ur: NEGATIVE mg/dL
Specific Gravity, Urine: 1.018 (ref 1.005–1.030)
Urobilinogen, UA: 1 mg/dL (ref 0.0–1.0)
pH: 6 (ref 5.0–8.0)

## 2010-05-10 LAB — PROTIME-INR
INR: 1.15 (ref 0.00–1.49)
INR: 1.22 (ref 0.00–1.49)
INR: 1.64 — ABNORMAL HIGH (ref 0.00–1.49)
Prothrombin Time: 14.6 seconds (ref 11.6–15.2)
Prothrombin Time: 15.3 seconds — ABNORMAL HIGH (ref 11.6–15.2)
Prothrombin Time: 19.3 seconds — ABNORMAL HIGH (ref 11.6–15.2)

## 2010-05-10 LAB — POCT I-STAT 3, VENOUS BLOOD GAS (G3P V)
TCO2: 19 mmol/L (ref 0–100)
pCO2, Ven: 36 mmHg — ABNORMAL LOW (ref 45.0–50.0)
pCO2, Ven: 36.2 mmHg — ABNORMAL LOW (ref 45.0–50.0)
pH, Ven: 7.314 — ABNORMAL HIGH (ref 7.250–7.300)
pH, Ven: 7.394 — ABNORMAL HIGH (ref 7.250–7.300)
pO2, Ven: 28 mmHg — CL (ref 30.0–45.0)
pO2, Ven: 29 mmHg — CL (ref 30.0–45.0)

## 2010-05-10 LAB — GLUCOSE, CAPILLARY
Glucose-Capillary: 104 mg/dL — ABNORMAL HIGH (ref 70–99)
Glucose-Capillary: 111 mg/dL — ABNORMAL HIGH (ref 70–99)
Glucose-Capillary: 113 mg/dL — ABNORMAL HIGH (ref 70–99)
Glucose-Capillary: 114 mg/dL — ABNORMAL HIGH (ref 70–99)
Glucose-Capillary: 115 mg/dL — ABNORMAL HIGH (ref 70–99)
Glucose-Capillary: 121 mg/dL — ABNORMAL HIGH (ref 70–99)
Glucose-Capillary: 124 mg/dL — ABNORMAL HIGH (ref 70–99)
Glucose-Capillary: 125 mg/dL — ABNORMAL HIGH (ref 70–99)
Glucose-Capillary: 127 mg/dL — ABNORMAL HIGH (ref 70–99)
Glucose-Capillary: 129 mg/dL — ABNORMAL HIGH (ref 70–99)
Glucose-Capillary: 133 mg/dL — ABNORMAL HIGH (ref 70–99)
Glucose-Capillary: 138 mg/dL — ABNORMAL HIGH (ref 70–99)
Glucose-Capillary: 154 mg/dL — ABNORMAL HIGH (ref 70–99)

## 2010-05-10 LAB — CK TOTAL AND CKMB (NOT AT ARMC)
CK, MB: 1.3 ng/mL (ref 0.3–4.0)
CK, MB: 1.4 ng/mL (ref 0.3–4.0)
CK, MB: 1.4 ng/mL (ref 0.3–4.0)
Relative Index: INVALID (ref 0.0–2.5)
Relative Index: INVALID (ref 0.0–2.5)
Total CK: 53 U/L (ref 7–232)
Total CK: 62 U/L (ref 7–232)

## 2010-05-10 LAB — POCT I-STAT GLUCOSE
Glucose, Bld: 95 mg/dL (ref 70–99)
Operator id: 125961
Operator id: 3406
Operator id: 3406

## 2010-05-10 LAB — URINE CULTURE: Colony Count: >=100000

## 2010-05-10 LAB — BASIC METABOLIC PANEL
BUN: 11 mg/dL (ref 6–23)
BUN: 13 mg/dL (ref 6–23)
BUN: 13 mg/dL (ref 6–23)
CO2: 25 mEq/L (ref 19–32)
CO2: 26 mEq/L (ref 19–32)
CO2: 28 mEq/L (ref 19–32)
Calcium: 7.5 mg/dL — ABNORMAL LOW (ref 8.4–10.5)
Calcium: 7.9 mg/dL — ABNORMAL LOW (ref 8.4–10.5)
Calcium: 8.9 mg/dL (ref 8.4–10.5)
Chloride: 103 mEq/L (ref 96–112)
Chloride: 108 mEq/L (ref 96–112)
Chloride: 108 mEq/L (ref 96–112)
Chloride: 110 mEq/L (ref 96–112)
Creatinine, Ser: 1.14 mg/dL (ref 0.4–1.5)
Creatinine, Ser: 1.27 mg/dL (ref 0.4–1.5)
Creatinine, Ser: 1.51 mg/dL — ABNORMAL HIGH (ref 0.4–1.5)
GFR calc Af Amer: 56 mL/min — ABNORMAL LOW (ref >60)
GFR calc Af Amer: >60 mL/min (ref >60)
GFR calc Af Amer: >60 mL/min (ref >60)
GFR calc Af Amer: >60 mL/min (ref >60)
GFR calc non Af Amer: 46 mL/min — ABNORMAL LOW (ref >60)
GFR calc non Af Amer: 56 mL/min — ABNORMAL LOW (ref >60)
GFR calc non Af Amer: >60 mL/min (ref >60)
Glucose, Bld: 120 mg/dL — ABNORMAL HIGH (ref 70–99)
Glucose, Bld: 157 mg/dL — ABNORMAL HIGH (ref 70–99)
Glucose, Bld: 99 mg/dL (ref 70–99)
Potassium: 3.5 mEq/L (ref 3.5–5.1)
Potassium: 3.9 mEq/L (ref 3.5–5.1)
Potassium: 4.2 mEq/L (ref 3.5–5.1)
Potassium: 4.2 mEq/L (ref 3.5–5.1)
Sodium: 136 mEq/L (ref 135–145)
Sodium: 138 mEq/L (ref 135–145)
Sodium: 140 mEq/L (ref 135–145)
Sodium: 142 mEq/L (ref 135–145)

## 2010-05-10 LAB — HEMOGLOBIN AND HEMATOCRIT, BLOOD: HCT: 18.6 % — ABNORMAL LOW (ref 39.0–52.0)

## 2010-05-10 LAB — CULTURE, BLOOD (ROUTINE X 2)
Culture: NO GROWTH
Culture: NO GROWTH

## 2010-05-10 LAB — LIPASE, BLOOD: Lipase: 35 U/L (ref 11–59)

## 2010-05-10 LAB — LIPID PANEL
Cholesterol: 170 mg/dL (ref 0–200)
Cholesterol: 179 mg/dL (ref 0–200)
HDL: 43 mg/dL (ref >39)
LDL Cholesterol: 113 mg/dL — ABNORMAL HIGH (ref 0–99)
LDL Cholesterol: 118 mg/dL — ABNORMAL HIGH (ref 0–99)
Total CHOL/HDL Ratio: 4.2 RATIO
Triglycerides: 48 mg/dL (ref <150)
Triglycerides: 88 mg/dL (ref <150)
VLDL: 18 mg/dL (ref 0–40)

## 2010-05-10 LAB — POCT CARDIAC MARKERS
CKMB, poc: <1 ng/mL — ABNORMAL LOW (ref 1.0–8.0)
CKMB, poc: <1 ng/mL — ABNORMAL LOW (ref 1.0–8.0)
Myoglobin, poc: 112 ng/mL (ref 12–200)
Myoglobin, poc: 78.6 ng/mL (ref 12–200)
Troponin i, poc: <0.05 ng/mL (ref 0.00–0.09)
Troponin i, poc: <0.05 ng/mL (ref 0.00–0.09)

## 2010-05-10 LAB — PREPARE PLATELETS

## 2010-05-10 LAB — MAGNESIUM
Magnesium: 2.6 mg/dL — ABNORMAL HIGH (ref 1.5–2.5)
Magnesium: 2.9 mg/dL — ABNORMAL HIGH (ref 1.5–2.5)
Magnesium: 3 mg/dL — ABNORMAL HIGH (ref 1.5–2.5)

## 2010-05-10 LAB — DIFFERENTIAL
Basophils Absolute: 0 10*3/uL (ref 0.0–0.1)
Basophils Relative: 0 % (ref 0–1)
Neutro Abs: 6.1 10*3/uL (ref 1.7–7.7)
Neutrophils Relative %: 75 % (ref 43–77)

## 2010-05-10 LAB — TROPONIN I
Troponin I: 0.01 ng/mL (ref 0.00–0.06)
Troponin I: 0.01 ng/mL (ref 0.00–0.06)

## 2010-05-10 LAB — CULTURE, RESPIRATORY W GRAM STAIN

## 2010-05-10 LAB — ABO/RH: ABO/RH(D): A POS

## 2010-05-10 LAB — APTT
aPTT: 27 seconds (ref 24–37)
aPTT: 28 seconds (ref 24–37)

## 2010-05-10 LAB — HEMOGLOBIN A1C
Hgb A1c MFr Bld: 5.5 % (ref <5.7)
Mean Plasma Glucose: 111 mg/dL (ref <117)

## 2010-05-10 LAB — MRSA PCR SCREENING: MRSA by PCR: POSITIVE — AB

## 2010-05-10 LAB — TSH
TSH: 4.942 u[IU]/mL — ABNORMAL HIGH (ref 0.350–4.500)
TSH: 7.39 u[IU]/mL — ABNORMAL HIGH (ref 0.350–4.500)

## 2010-05-10 LAB — SEDIMENTATION RATE: Sed Rate: 19 mm/hr — ABNORMAL HIGH (ref 0–16)

## 2010-05-12 LAB — DIFFERENTIAL
Basophils Absolute: 0 10*3/uL (ref 0.0–0.1)
Eosinophils Absolute: 0.2 10*3/uL (ref 0.0–0.7)
Eosinophils Relative: 2 % (ref 0–5)
Lymphs Abs: 1 10*3/uL (ref 0.7–4.0)
Neutrophils Relative %: 79 % — ABNORMAL HIGH (ref 43–77)

## 2010-05-12 LAB — CBC
HCT: 34.6 % — ABNORMAL LOW (ref 39.0–52.0)
MCV: 85.3 fL (ref 78.0–100.0)
Platelets: 172 10*3/uL (ref 150–400)
RDW: 16.4 % — ABNORMAL HIGH (ref 11.5–15.5)
WBC: 9 10*3/uL (ref 4.0–10.5)

## 2010-05-12 LAB — BASIC METABOLIC PANEL
BUN: 14 mg/dL (ref 6–23)
Chloride: 110 mEq/L (ref 96–112)
Creatinine, Ser: 1.48 mg/dL (ref 0.4–1.5)
GFR calc non Af Amer: 47 mL/min — ABNORMAL LOW (ref >60)
Glucose, Bld: 117 mg/dL — ABNORMAL HIGH (ref 70–99)

## 2010-05-12 LAB — POCT CARDIAC MARKERS: Myoglobin, poc: 83 ng/mL (ref 12–200)

## 2010-05-30 ENCOUNTER — Other Ambulatory Visit: Payer: Self-pay | Admitting: Family Medicine

## 2010-05-30 LAB — URINE CULTURE: Colony Count: >=100000

## 2010-05-30 LAB — CBC
HCT: 25.4 % — ABNORMAL LOW (ref 39.0–52.0)
HCT: 25.8 % — ABNORMAL LOW (ref 39.0–52.0)
Hemoglobin: 8.3 g/dL — ABNORMAL LOW (ref 13.0–17.0)
MCHC: 30.9 g/dL (ref 30.0–36.0)
MCHC: 32.5 g/dL (ref 30.0–36.0)
MCHC: 32.7 g/dL (ref 30.0–36.0)
MCHC: 32.7 g/dL (ref 30.0–36.0)
MCV: 84.6 fL (ref 78.0–100.0)
MCV: 84.8 fL (ref 78.0–100.0)
MCV: 85.3 fL (ref 78.0–100.0)
MCV: 85.3 fL (ref 78.0–100.0)
Platelets: 148 10*3/uL — ABNORMAL LOW (ref 150–400)
Platelets: 185 10*3/uL (ref 150–400)
RBC: 3.3 MIL/uL — ABNORMAL LOW (ref 4.22–5.81)
RBC: 3.49 MIL/uL — ABNORMAL LOW (ref 4.22–5.81)
RBC: 3.53 MIL/uL — ABNORMAL LOW (ref 4.22–5.81)
RDW: 18.2 % — ABNORMAL HIGH (ref 11.5–15.5)
RDW: 18.5 % — ABNORMAL HIGH (ref 11.5–15.5)
RDW: 18.7 % — ABNORMAL HIGH (ref 11.5–15.5)
WBC: 7.1 10*3/uL (ref 4.0–10.5)
WBC: 7.6 10*3/uL (ref 4.0–10.5)
WBC: 9.3 10*3/uL (ref 4.0–10.5)

## 2010-05-30 LAB — URINALYSIS, ROUTINE W REFLEX MICROSCOPIC
Bilirubin Urine: NEGATIVE
Nitrite: POSITIVE — AB
Protein, ur: 30 mg/dL — AB
Urobilinogen, UA: 0.2 mg/dL (ref 0.0–1.0)

## 2010-05-30 LAB — CARDIAC PANEL(CRET KIN+CKTOT+MB+TROPI)
CK, MB: 1.3 ng/mL (ref 0.3–4.0)
CK, MB: 1.3 ng/mL (ref 0.3–4.0)
Relative Index: INVALID (ref 0.0–2.5)
Troponin I: 0.04 ng/mL (ref 0.00–0.06)
Troponin I: 0.04 ng/mL (ref 0.00–0.06)

## 2010-05-30 LAB — COMPREHENSIVE METABOLIC PANEL
ALT: <8 U/L (ref 0–53)
AST: 17 U/L (ref 0–37)
Alkaline Phosphatase: 58 U/L (ref 39–117)
CO2: 27 mEq/L (ref 19–32)
Chloride: 105 mEq/L (ref 96–112)
Creatinine, Ser: 1.05 mg/dL (ref 0.4–1.5)
GFR calc Af Amer: >60 mL/min (ref >60)
GFR calc non Af Amer: >60 mL/min (ref >60)
Total Bilirubin: 1.2 mg/dL (ref 0.3–1.2)

## 2010-05-30 LAB — PREPARE FRESH FROZEN PLASMA

## 2010-05-30 LAB — HEMOGLOBIN A1C
Hgb A1c MFr Bld: 5.3 % (ref 4.6–6.1)
Mean Plasma Glucose: 105 mg/dL

## 2010-05-30 LAB — BASIC METABOLIC PANEL
BUN: 10 mg/dL (ref 6–23)
BUN: 11 mg/dL (ref 6–23)
Calcium: 8.1 mg/dL — ABNORMAL LOW (ref 8.4–10.5)
Calcium: 8.4 mg/dL (ref 8.4–10.5)
Chloride: 105 mEq/L (ref 96–112)
Creatinine, Ser: 1.04 mg/dL (ref 0.4–1.5)
Creatinine, Ser: 1.35 mg/dL (ref 0.4–1.5)
GFR calc Af Amer: >60 mL/min (ref >60)
GFR calc non Af Amer: >60 mL/min (ref >60)
Glucose, Bld: 105 mg/dL — ABNORMAL HIGH (ref 70–99)
Sodium: 139 mEq/L (ref 135–145)

## 2010-05-30 LAB — DIFFERENTIAL
Basophils Relative: 0 % (ref 0–1)
Eosinophils Absolute: 0 10*3/uL (ref 0.0–0.7)
Lymphs Abs: 0.2 10*3/uL — ABNORMAL LOW (ref 0.7–4.0)
Monocytes Relative: 6 % (ref 3–12)
Neutro Abs: 8.6 10*3/uL — ABNORMAL HIGH (ref 1.7–7.7)
Neutrophils Relative %: 92 % — ABNORMAL HIGH (ref 43–77)

## 2010-05-30 LAB — LACTIC ACID, PLASMA: Lactic Acid, Venous: 1.3 mmol/L (ref 0.5–2.2)

## 2010-05-30 LAB — BLOOD GAS, ARTERIAL
Acid-base deficit: 2.1 mmol/L — ABNORMAL HIGH (ref 0.0–2.0)
Drawn by: 308601
O2 Content: 2 L/min
pCO2 arterial: 40.8 mmHg (ref 35.0–45.0)
pH, Arterial: 7.363 (ref 7.350–7.450)

## 2010-05-30 LAB — FERRITIN: Ferritin: 86 ng/mL (ref 22–322)

## 2010-05-30 LAB — TYPE AND SCREEN: ABO/RH(D): A POS

## 2010-05-30 LAB — APTT: aPTT: 37 seconds (ref 24–37)

## 2010-05-30 LAB — PROTIME-INR
INR: 2.1 — ABNORMAL HIGH (ref 0.00–1.49)
Prothrombin Time: 23 seconds — ABNORMAL HIGH (ref 11.6–15.2)

## 2010-05-30 LAB — CULTURE, BLOOD (ROUTINE X 2)

## 2010-05-30 LAB — IRON AND TIBC: UIBC: 250 ug/dL

## 2010-05-30 LAB — CK TOTAL AND CKMB (NOT AT ARMC): Total CK: 54 U/L (ref 7–232)

## 2010-05-30 LAB — GLUCOSE, CAPILLARY: Glucose-Capillary: 117 mg/dL — ABNORMAL HIGH (ref 70–99)

## 2010-06-01 NOTE — Telephone Encounter (Signed)
Call in #[phone] with 5 rf 

## 2010-06-04 LAB — PROTIME-INR
INR: 3 — ABNORMAL HIGH (ref 0.00–1.49)
INR: 6 (ref 0.00–1.49)
INR: 1.6 — ABNORMAL HIGH (ref 0.00–1.49)
Prothrombin Time: 33.6 seconds — ABNORMAL HIGH (ref 11.6–15.2)
Prothrombin Time: 59.6 seconds — ABNORMAL HIGH (ref 11.6–15.2)
Prothrombin Time: 18.6 seconds — ABNORMAL HIGH (ref 11.6–15.2)
Prothrombin Time: 19.6 seconds — ABNORMAL HIGH (ref 11.6–15.2)
Prothrombin Time: 21.2 seconds — ABNORMAL HIGH (ref 11.6–15.2)

## 2010-06-04 LAB — CBC
HCT: 34.6 % — ABNORMAL LOW (ref 39.0–52.0)
MCHC: 33.3 g/dL (ref 30.0–36.0)
MCV: 97.5 fL (ref 78.0–100.0)
Platelets: 125 10*3/uL — ABNORMAL LOW (ref 150–400)

## 2010-06-04 LAB — DIFFERENTIAL
Basophils Relative: 0 % (ref 0–1)
Eosinophils Absolute: 0.2 10*3/uL (ref 0.0–0.7)
Eosinophils Relative: 3 % (ref 0–5)
Neutrophils Relative %: 74 % (ref 43–77)

## 2010-06-04 LAB — URINALYSIS, ROUTINE W REFLEX MICROSCOPIC
Bilirubin Urine: NEGATIVE
Ketones, ur: NEGATIVE mg/dL
Nitrite: NEGATIVE
Protein, ur: NEGATIVE mg/dL
Urobilinogen, UA: 0.2 mg/dL (ref 0.0–1.0)

## 2010-06-04 LAB — COMPREHENSIVE METABOLIC PANEL
Albumin: 3 g/dL — ABNORMAL LOW (ref 3.5–5.2)
Alkaline Phosphatase: 76 U/L (ref 39–117)
BUN: 5 mg/dL — ABNORMAL LOW (ref 6–23)
CO2: 26 mEq/L (ref 19–32)
Chloride: 107 mEq/L (ref 96–112)
Creatinine, Ser: 1 mg/dL (ref 0.4–1.5)
GFR calc non Af Amer: >60 mL/min (ref >60)
Glucose, Bld: 100 mg/dL — ABNORMAL HIGH (ref 70–99)
Potassium: 4.1 mEq/L (ref 3.5–5.1)
Total Bilirubin: 0.8 mg/dL (ref 0.3–1.2)

## 2010-06-04 LAB — POCT I-STAT, CHEM 8
BUN: 6 mg/dL (ref 6–23)
Calcium, Ion: 1.13 mmol/L (ref 1.12–1.32)
Glucose, Bld: 125 mg/dL — ABNORMAL HIGH (ref 70–99)
HCT: 35 % — ABNORMAL LOW (ref 39.0–52.0)
TCO2: 25 mmol/L (ref 0–100)

## 2010-06-04 LAB — TYPE AND SCREEN: ABO/RH(D): A POS

## 2010-06-04 LAB — PREPARE FRESH FROZEN PLASMA

## 2010-06-04 LAB — ABO/RH: ABO/RH(D): A POS

## 2010-06-04 LAB — APTT: aPTT: 48 seconds — ABNORMAL HIGH (ref 24–37)

## 2010-06-04 LAB — GLUCOSE, CAPILLARY

## 2010-06-05 ENCOUNTER — Telehealth: Payer: Self-pay

## 2010-06-05 ENCOUNTER — Telehealth: Payer: Self-pay | Admitting: Cardiology

## 2010-06-05 NOTE — Telephone Encounter (Signed)
Pt looking for DNR forms. Pt wants to know if can get it here

## 2010-06-05 NOTE — Telephone Encounter (Signed)
Called back at number provided --no answer , and no answering machine  Call back at [phone].

## 2010-06-05 NOTE — Telephone Encounter (Signed)
Spoke with pt dtr, DNR forms mailed to her. Follow up appt made with dr Jens Som Deliah Goody

## 2010-06-05 NOTE — Telephone Encounter (Signed)
Spoke with spouse stated saw Dr Jens Som in Feb 2012 and wants DNR  Advised wife since last ov with Dr [name] 08-2009 may need office visit with him or she could call Dr Jens Som. Wife stated she will call Dr Jens Som and call back on Monday when Dr [name] returns to see if needs office visit. Wife stated that pt has "Advance Directives" and she will mail [place] a copy for chart to have.

## 2010-06-05 NOTE — Telephone Encounter (Signed)
Wants to know about "DNR" order.

## 2010-06-08 ENCOUNTER — Other Ambulatory Visit: Payer: Self-pay | Admitting: *Deleted

## 2010-06-08 NOTE — Telephone Encounter (Signed)
Trazedone 150 mg one po q hs Temazepam 15 mg. 3 q hs Ativan 0.5 mg. 3 tabs tid  Not sleeping at night, and has nightmares.    [facility])  [phone]  Wife say pt will not come to the office.

## 2010-06-08 NOTE — Telephone Encounter (Signed)
[name] called and said that they are unable to send escript to [name]. No escript number avail. Pt is req refill of Protonix 40 mg.  Pls call in asap.

## 2010-06-08 NOTE — Telephone Encounter (Signed)
noted 

## 2010-06-09 LAB — BLOOD GAS, ARTERIAL
Acid-base deficit: 4.7 mmol/L — ABNORMAL HIGH (ref 0.0–2.0)
Bicarbonate: 18.4 mEq/L — ABNORMAL LOW (ref 20.0–24.0)
Drawn by: 30860
FIO2: 0.3 %
FIO2: 0.3 %
MECHVT: 550 mL
Mode: POSITIVE
O2 Saturation: 99.1 %
O2 Saturation: 99.4 %
PEEP: 5 cmH2O
PEEP: 5 cmH2O
Patient temperature: 98.6
Pressure support: 5 cmH2O
RATE: 12 resp/min
pCO2 arterial: 29.1 mmHg — ABNORMAL LOW (ref 35.0–45.0)
pH, Arterial: 7.431 (ref 7.350–7.450)

## 2010-06-09 LAB — APTT
aPTT: 26 seconds (ref 24–37)
aPTT: 41 seconds — ABNORMAL HIGH (ref 24–37)
aPTT: 75 seconds — ABNORMAL HIGH (ref 24–37)

## 2010-06-09 LAB — DIFFERENTIAL
Basophils Absolute: 0 10*3/uL (ref 0.0–0.1)
Basophils Relative: 0 % (ref 0–1)
Basophils Relative: 0 % (ref 0–1)
Eosinophils Absolute: 0 10*3/uL (ref 0.0–0.7)
Eosinophils Absolute: 0 10*3/uL (ref 0.0–0.7)
Eosinophils Absolute: 0 10*3/uL (ref 0.0–0.7)
Eosinophils Relative: 0 % (ref 0–5)
Eosinophils Relative: 0 % (ref 0–5)
Lymphocytes Relative: 10 % — ABNORMAL LOW (ref 12–46)
Lymphocytes Relative: 6 % — ABNORMAL LOW (ref 12–46)
Lymphs Abs: 0.5 10*3/uL — ABNORMAL LOW (ref 0.7–4.0)
Monocytes Relative: 4 % (ref 3–12)
Neutrophils Relative %: 87 % — ABNORMAL HIGH (ref 43–77)
Neutrophils Relative %: 88 % — ABNORMAL HIGH (ref 43–77)

## 2010-06-09 LAB — BASIC METABOLIC PANEL
BUN: 13 mg/dL (ref 6–23)
BUN: 14 mg/dL (ref 6–23)
BUN: 10 mg/dL (ref 6–23)
BUN: 11 mg/dL (ref 6–23)
BUN: 16 mg/dL (ref 6–23)
BUN: 16 mg/dL (ref 6–23)
BUN: 16 mg/dL (ref 6–23)
CO2: 25 mEq/L (ref 19–32)
CO2: 20 mEq/L (ref 19–32)
CO2: 27 mEq/L (ref 19–32)
CO2: 28 mEq/L (ref 19–32)
Calcium: 9.4 mg/dL (ref 8.4–10.5)
Calcium: 8 mg/dL — ABNORMAL LOW (ref 8.4–10.5)
Calcium: 8.6 mg/dL (ref 8.4–10.5)
Chloride: 105 mEq/L (ref 96–112)
Chloride: 102 mEq/L (ref 96–112)
Chloride: 103 mEq/L (ref 96–112)
Chloride: 104 mEq/L (ref 96–112)
Chloride: 109 mEq/L (ref 96–112)
Chloride: 113 mEq/L — ABNORMAL HIGH (ref 96–112)
Chloride: 118 mEq/L — ABNORMAL HIGH (ref 96–112)
Creatinine, Ser: 1 mg/dL (ref 0.4–1.5)
Creatinine, Ser: 0.82 mg/dL (ref 0.4–1.5)
Creatinine, Ser: 0.89 mg/dL (ref 0.4–1.5)
Creatinine, Ser: 0.99 mg/dL (ref 0.4–1.5)
Creatinine, Ser: 1 mg/dL (ref 0.4–1.5)
Creatinine, Ser: 1.15 mg/dL (ref 0.4–1.5)
GFR calc Af Amer: >60 mL/min (ref >60)
GFR calc Af Amer: >60 mL/min (ref >60)
GFR calc Af Amer: >60 mL/min (ref >60)
GFR calc non Af Amer: >60 mL/min (ref >60)
GFR calc non Af Amer: >60 mL/min (ref >60)
GFR calc non Af Amer: >60 mL/min (ref >60)
Glucose, Bld: 118 mg/dL — ABNORMAL HIGH (ref 70–99)
Glucose, Bld: 93 mg/dL (ref 70–99)
Glucose, Bld: 113 mg/dL — ABNORMAL HIGH (ref 70–99)
Potassium: 4 mEq/L (ref 3.5–5.1)
Potassium: 3.5 mEq/L (ref 3.5–5.1)
Potassium: 3.7 mEq/L (ref 3.5–5.1)
Potassium: 4 mEq/L (ref 3.5–5.1)
Sodium: 148 mEq/L — ABNORMAL HIGH (ref 135–145)

## 2010-06-09 LAB — URINALYSIS, ROUTINE W REFLEX MICROSCOPIC
Glucose, UA: NEGATIVE mg/dL
Protein, ur: 30 mg/dL — AB
Urobilinogen, UA: 0.2 mg/dL (ref 0.0–1.0)

## 2010-06-09 LAB — CBC
HCT: 39.5 % (ref 39.0–52.0)
HCT: 40.1 % (ref 39.0–52.0)
HCT: 31 % — ABNORMAL LOW (ref 39.0–52.0)
Hemoglobin: 10.2 g/dL — ABNORMAL LOW (ref 13.0–17.0)
Hemoglobin: 10.5 g/dL — ABNORMAL LOW (ref 13.0–17.0)
MCHC: 34.1 g/dL (ref 30.0–36.0)
MCHC: 34.5 g/dL (ref 30.0–36.0)
MCHC: 33.2 g/dL (ref 30.0–36.0)
MCHC: 33.3 g/dL (ref 30.0–36.0)
MCHC: 33.6 g/dL (ref 30.0–36.0)
MCV: 97.3 fL (ref 78.0–100.0)
MCV: 97.4 fL (ref 78.0–100.0)
MCV: 98.1 fL (ref 78.0–100.0)
MCV: 98.3 fL (ref 78.0–100.0)
MCV: 99.5 fL (ref 78.0–100.0)
Platelets: 170 10*3/uL (ref 150–400)
Platelets: 178 10*3/uL (ref 150–400)
Platelets: 132 10*3/uL — ABNORMAL LOW (ref 150–400)
Platelets: 141 10*3/uL — ABNORMAL LOW (ref 150–400)
Platelets: 160 10*3/uL (ref 150–400)
RDW: 12.1 % (ref 11.5–15.5)
RDW: 12.1 % (ref 11.5–15.5)
RDW: 12.3 % (ref 11.5–15.5)
RDW: 12.9 % (ref 11.5–15.5)
RDW: 13.2 % (ref 11.5–15.5)
RDW: 13.3 % (ref 11.5–15.5)

## 2010-06-09 LAB — CULTURE, RESPIRATORY W GRAM STAIN

## 2010-06-09 LAB — CULTURE, BLOOD (ROUTINE X 2)

## 2010-06-09 LAB — COMPREHENSIVE METABOLIC PANEL
ALT: 17 U/L (ref 0–53)
ALT: 18 U/L (ref 0–53)
AST: 37 U/L (ref 0–37)
Albumin: 2.7 g/dL — ABNORMAL LOW (ref 3.5–5.2)
CO2: 19 mEq/L (ref 19–32)
Calcium: 7.8 mg/dL — ABNORMAL LOW (ref 8.4–10.5)
Chloride: 118 mEq/L — ABNORMAL HIGH (ref 96–112)
Creatinine, Ser: 1.15 mg/dL (ref 0.4–1.5)
Creatinine, Ser: 1.17 mg/dL (ref 0.4–1.5)
GFR calc Af Amer: >60 mL/min (ref >60)
GFR calc Af Amer: >60 mL/min (ref >60)
GFR calc non Af Amer: >60 mL/min (ref >60)
Glucose, Bld: 108 mg/dL — ABNORMAL HIGH (ref 70–99)
Sodium: 147 mEq/L — ABNORMAL HIGH (ref 135–145)
Sodium: 148 mEq/L — ABNORMAL HIGH (ref 135–145)
Total Bilirubin: 2.1 mg/dL — ABNORMAL HIGH (ref 0.3–1.2)
Total Protein: 4.8 g/dL — ABNORMAL LOW (ref 6.0–8.3)

## 2010-06-09 LAB — CLOSTRIDIUM DIFFICILE EIA: C difficile Toxins A+B, EIA: NEGATIVE

## 2010-06-09 LAB — PROTIME-INR
INR: 3.9 — ABNORMAL HIGH (ref 0.00–1.49)
INR: 1.8 — ABNORMAL HIGH (ref 0.00–1.49)
INR: 1.9 — ABNORMAL HIGH (ref 0.00–1.49)
INR: 2.2 — ABNORMAL HIGH (ref 0.00–1.49)
INR: 2.2 — ABNORMAL HIGH (ref 0.00–1.49)
INR: 4.9 — ABNORMAL HIGH (ref 0.00–1.49)
Prothrombin Time: 41.3 seconds — ABNORMAL HIGH (ref 11.6–15.2)
Prothrombin Time: 21.5 seconds — ABNORMAL HIGH (ref 11.6–15.2)
Prothrombin Time: 21.6 seconds — ABNORMAL HIGH (ref 11.6–15.2)
Prothrombin Time: 23.6 seconds — ABNORMAL HIGH (ref 11.6–15.2)
Prothrombin Time: 27.4 seconds — ABNORMAL HIGH (ref 11.6–15.2)
Prothrombin Time: 50 seconds — ABNORMAL HIGH (ref 11.6–15.2)

## 2010-06-09 LAB — GLUCOSE, CAPILLARY
Glucose-Capillary: 102 mg/dL — ABNORMAL HIGH (ref 70–99)
Glucose-Capillary: 108 mg/dL — ABNORMAL HIGH (ref 70–99)
Glucose-Capillary: 110 mg/dL — ABNORMAL HIGH (ref 70–99)
Glucose-Capillary: 115 mg/dL — ABNORMAL HIGH (ref 70–99)
Glucose-Capillary: 116 mg/dL — ABNORMAL HIGH (ref 70–99)
Glucose-Capillary: 118 mg/dL — ABNORMAL HIGH (ref 70–99)
Glucose-Capillary: 119 mg/dL — ABNORMAL HIGH (ref 70–99)
Glucose-Capillary: 121 mg/dL — ABNORMAL HIGH (ref 70–99)
Glucose-Capillary: 127 mg/dL — ABNORMAL HIGH (ref 70–99)
Glucose-Capillary: 130 mg/dL — ABNORMAL HIGH (ref 70–99)
Glucose-Capillary: 132 mg/dL — ABNORMAL HIGH (ref 70–99)
Glucose-Capillary: 139 mg/dL — ABNORMAL HIGH (ref 70–99)
Glucose-Capillary: 161 mg/dL — ABNORMAL HIGH (ref 70–99)
Glucose-Capillary: 168 mg/dL — ABNORMAL HIGH (ref 70–99)
Glucose-Capillary: 96 mg/dL (ref 70–99)
Glucose-Capillary: 97 mg/dL (ref 70–99)

## 2010-06-09 LAB — POCT I-STAT 3, ART BLOOD GAS (G3+)
Bicarbonate: 24.4 mEq/L — ABNORMAL HIGH (ref 20.0–24.0)
Patient temperature: 100.4
pH, Arterial: 7.41 (ref 7.350–7.450)
pO2, Arterial: 333 mmHg — ABNORMAL HIGH (ref 80.0–100.0)

## 2010-06-09 LAB — PHENYTOIN LEVEL, TOTAL: Phenytoin Lvl: 7.8 ug/mL — ABNORMAL LOW (ref 10.0–20.0)

## 2010-06-09 LAB — POTASSIUM: Potassium: 3 mEq/L — ABNORMAL LOW (ref 3.5–5.1)

## 2010-06-09 LAB — CARBOXYHEMOGLOBIN
Methemoglobin: 1.6 % — ABNORMAL HIGH (ref 0.0–1.5)
O2 Saturation: 88.2 %
Total hemoglobin: 10.4 g/dL — ABNORMAL LOW (ref 13.5–18.0)

## 2010-06-09 LAB — URINE MICROSCOPIC-ADD ON

## 2010-06-09 LAB — URINE CULTURE
Colony Count: NO GROWTH
Culture: NO GROWTH

## 2010-06-09 LAB — BRAIN NATRIURETIC PEPTIDE: Pro B Natriuretic peptide (BNP): 361 pg/mL — ABNORMAL HIGH (ref 0.0–100.0)

## 2010-06-09 MED ORDER — PANTOPRAZOLE SODIUM 40 MG PO TBEC: 40.0000 mg | DELAYED_RELEASE_TABLET | Freq: Every day | ORAL | Status: DC

## 2010-06-09 NOTE — Telephone Encounter (Signed)
Call in Protonix 40 mg q day, #30 with 11 rf

## 2010-06-09 NOTE — Telephone Encounter (Signed)
[facility] notified spoke to [place] and rx given

## 2010-06-12 ENCOUNTER — Telehealth: Payer: Self-pay | Admitting: *Deleted

## 2010-06-12 NOTE — Telephone Encounter (Signed)
Pt has multiple medication probs.  Needs appt with Dr. [name].  Wife will call back for an appt.

## 2010-06-15 ENCOUNTER — Telehealth: Payer: Self-pay | Admitting: Family Medicine

## 2010-06-15 NOTE — Telephone Encounter (Signed)
Have him make an OV with me to discuss this

## 2010-06-15 NOTE — Telephone Encounter (Signed)
Pts wife called and is req a call back today before 1pm or after 3:30pm today. Pts wife needs to discuss some things that has been going on with pt re: depression and anxiety. Pt gets very anxious everyday about the same time, and will last for about an hr and 1/2. Pt will cry a lot and start yelling.

## 2010-06-16 ENCOUNTER — Encounter: Payer: Self-pay | Admitting: Family Medicine

## 2010-06-16 NOTE — Telephone Encounter (Signed)
Transferred to scheduler for OV appointment to be made.

## 2010-06-19 ENCOUNTER — Encounter: Payer: Self-pay | Admitting: Family Medicine

## 2010-06-19 ENCOUNTER — Ambulatory Visit (INDEPENDENT_AMBULATORY_CARE_PROVIDER_SITE_OTHER): Payer: Medicare Other | Admitting: Family Medicine

## 2010-06-19 VITALS — BP 84/60 | HR 80 | Temp 97.3°F | Ht 75.0 in | Wt 232.0 lb

## 2010-06-19 DIAGNOSIS — R5383 Other fatigue: Secondary | ICD-10-CM

## 2010-06-19 DIAGNOSIS — F329 Major depressive disorder, single episode, unspecified: Secondary | ICD-10-CM

## 2010-06-19 DIAGNOSIS — G20 Parkinson's disease: Secondary | ICD-10-CM

## 2010-06-19 DIAGNOSIS — Z79899 Other long term (current) drug therapy: Secondary | ICD-10-CM

## 2010-06-19 DIAGNOSIS — R5381 Other malaise: Secondary | ICD-10-CM

## 2010-06-19 DIAGNOSIS — R531 Weakness: Secondary | ICD-10-CM

## 2010-06-19 DIAGNOSIS — F411 Generalized anxiety disorder: Secondary | ICD-10-CM

## 2010-06-19 DIAGNOSIS — I2510 Atherosclerotic heart disease of native coronary artery without angina pectoris: Secondary | ICD-10-CM

## 2010-06-19 DIAGNOSIS — F419 Anxiety disorder, unspecified: Secondary | ICD-10-CM

## 2010-06-19 LAB — CBC WITH DIFFERENTIAL/PLATELET
Basophils Absolute: 0.1 10*3/uL (ref 0.0–0.1)
Eosinophils Absolute: 0.1 10*3/uL (ref 0.0–0.7)
HCT: 35.4 % — ABNORMAL LOW (ref 39.0–52.0)
Hemoglobin: 11.6 g/dL — ABNORMAL LOW (ref 13.0–17.0)
Lymphs Abs: 1.4 10*3/uL (ref 0.7–4.0)
MCHC: 32.7 g/dL (ref 30.0–36.0)
MCV: 82.7 fl (ref 78.0–100.0)
Monocytes Absolute: 0.6 10*3/uL (ref 0.1–1.0)
Neutro Abs: 4.9 10*3/uL (ref 1.4–7.7)
Platelets: 213 10*3/uL (ref 150.0–400.0)
RDW: 17.2 % — ABNORMAL HIGH (ref 11.5–14.6)

## 2010-06-19 LAB — BASIC METABOLIC PANEL
BUN: 11 mg/dL (ref 6–23)
CO2: 26 mEq/L (ref 19–32)
Chloride: 107 mEq/L (ref 96–112)
GFR: 43.88 mL/min — ABNORMAL LOW (ref >60.00)
Glucose, Bld: 92 mg/dL (ref 70–99)
Potassium: 4.9 mEq/L (ref 3.5–5.1)

## 2010-06-19 LAB — TSH: TSH: 4.9 u[IU]/mL (ref 0.35–5.50)

## 2010-06-19 LAB — HEPATIC FUNCTION PANEL
Albumin: 4.1 g/dL (ref 3.5–5.2)
Total Bilirubin: 0.9 mg/dL (ref 0.3–1.2)

## 2010-06-19 LAB — VITAMIN B12: Vitamin B-12: 706 pg/mL (ref 211–911)

## 2010-06-19 MED ORDER — LORAZEPAM 2 MG PO TABS: 2.0000 mg | ORAL_TABLET | Freq: Four times a day (QID) | ORAL | Status: AC | PRN

## 2010-06-19 NOTE — Progress Notes (Signed)
Addended by: Rita Ohara on: 06/19/2010 12:10 PM   Modules accepted: Orders

## 2010-06-19 NOTE — Progress Notes (Signed)
  Subjective:    Patient ID: [name], male    DOB: February 20, 1941, 70 y.o.   MRN: [id number]  HPI Here with his wife to follow up on depression and anxiety, along with other health issues. He continues to see Dr. Jens Som for cardiac care and Dr. Vickey Huger for Parkinsons care. His depression is fairly stable but he has had more anxiety lately. He is currently taking a total of 1.5 mg Lorazepam tid. This works fairly well but it seems to wear off very quickly. He sleeps well.    Review of Systems  Constitutional: Positive for fatigue.  Respiratory: Negative.   Cardiovascular: Negative.   Gastrointestinal: Negative.   Neurological: Positive for tremors and weakness.  Psychiatric/Behavioral: Positive for dysphoric mood and decreased concentration. Negative for suicidal ideas, hallucinations, behavioral problems, confusion, sleep disturbance, self-injury and agitation. The patient is nervous/anxious. The patient is not hyperactive.        Objective:   Physical Exam  Constitutional: He is oriented to person, place, and time.       Alert, walks slowly with a cane. He has put on some weight since I last saw him   Neck: No thyromegaly present.  Cardiovascular: Normal rate, regular rhythm, normal heart sounds and intact distal pulses.   Pulmonary/Chest: Effort normal and breath sounds normal.  Lymphadenopathy:    He has no cervical adenopathy.  Neurological: He is alert and oriented to person, place, and time. No cranial nerve deficit.  Psychiatric:       Affect is blunted but eye contact is good and he does smile at times          Assessment & Plan:  Increase Lorazepam to 2 mg qid. Get non-fasting labs today. We discussed his DNR status and filled out forms to renew this.

## 2010-06-23 NOTE — Progress Notes (Signed)
Pt informed via wife. Informed to add OTC Iron supplement.

## 2010-06-25 ENCOUNTER — Telehealth: Payer: Self-pay | Admitting: Cardiology

## 2010-06-25 NOTE — Telephone Encounter (Signed)
Pt needs labwork and some paperwork completed and prior to appt can you call her this afternoon to discuss it

## 2010-06-26 NOTE — Telephone Encounter (Signed)
Spoke with pt wife, script mailed to pt for labs to be done in [employer]

## 2010-06-29 ENCOUNTER — Encounter: Payer: Self-pay | Admitting: Cardiology

## 2010-07-01 ENCOUNTER — Ambulatory Visit (INDEPENDENT_AMBULATORY_CARE_PROVIDER_SITE_OTHER): Payer: MEDICARE | Admitting: Cardiology

## 2010-07-01 ENCOUNTER — Encounter: Payer: Self-pay | Admitting: Cardiology

## 2010-07-01 VITALS — BP 111/82 | HR 96 | Resp 18 | Ht 75.0 in | Wt 232.0 lb

## 2010-07-01 DIAGNOSIS — Z9889 Other specified postprocedural states: Secondary | ICD-10-CM

## 2010-07-01 DIAGNOSIS — I951 Orthostatic hypotension: Secondary | ICD-10-CM

## 2010-07-01 DIAGNOSIS — E785 Hyperlipidemia, unspecified: Secondary | ICD-10-CM

## 2010-07-01 DIAGNOSIS — I2510 Atherosclerotic heart disease of native coronary artery without angina pectoris: Secondary | ICD-10-CM

## 2010-07-01 DIAGNOSIS — I4891 Unspecified atrial fibrillation: Secondary | ICD-10-CM

## 2010-07-01 DIAGNOSIS — I10 Essential (primary) hypertension: Secondary | ICD-10-CM

## 2010-07-01 NOTE — Progress Notes (Signed)
HPI: Pleasant male with past medical history of coronary artery disease and atrial fibrillation for followup. Patient admitted in July of 2011 with chest pain. Cardiac catheterization revealed an ejection fraction of 55-60% and significant mitral regurgitation. The right coronary artery had no significant stenosis. There was a 40% circumflex. The proximal portion of the left anterior descending artery has about 90% narrowing. Echocardiogram showed normal LV function and severe mitral regurgitation. On July 12 of 2011 the patient underwent  two-vessel coronary artery bypass grafting (left internal mammary artery to LAD, saphenous vein graft to obtuse marginal) and MV repair with a 22-mm Edwards ring and oversewing of left atrial appendage. Echo in Oct 2011 revealed mild systolic dysfunction, EF 45-50%; s/p mitral valve repair. There was no significant stenosis. There was a very eccentric jet of mitral regurgitation that was poorly characterized. It appeared mild but given a very complete CW doppler jet it could be more moderate. Normal RV size with mild systolic dysfunction. Mild pulmonary hypertension. Patient also with orthostatic hypotension. Since I last saw him in Feb 2012, he denies dyspnea on exertion, orthopnea, PND, pedal edema, palpitations, syncope or chest pain.  Current Outpatient Prescriptions  Medication Sig Dispense Refill  . aspirin 81 MG tablet Take 81 mg by mouth daily.        . carbidopa-levodopa (SINEMET) 25-100 MG per tablet Take 1 tablet by mouth 2 (two) times daily.        . chlorproMAZINE (THORAZINE) 25 MG tablet TAKE 2 TABLETS DAILY AS  NEEDED FOR HICCUPS  60 tablet  5  . LORazepam (ATIVAN) 0.5 MG tablet Take 0.5 mg by mouth. 4 in AM, 3 PM and 4 at bedtime       . Multiple Vitamin (MULTIVITAMIN) tablet Take 1 tablet by mouth daily.        . pantoprazole (PROTONIX) 40 MG tablet Take 40 mg by mouth 2 (two) times daily.        . polyethylene glycol (MIRALAX / GLYCOLAX) packet Take 17  g by mouth daily.        . promethazine (PHENERGAN) 25 MG tablet Take 25 mg by mouth 2 (two) times daily as needed.       . rosuvastatin (CRESTOR) 10 MG tablet Take 10 mg by mouth daily.        . sertraline (ZOLOFT) 50 MG tablet Take 50 mg by mouth daily.        . Tamsulosin HCl (FLOMAX) 0.4 MG CAPS Take 0.4 mg by mouth daily.        . temazepam (RESTORIL) 15 MG capsule Take 15 mg by mouth. Take 3 at bedtime      . traZODone (DESYREL) 100 MG tablet Take 100 mg by mouth at bedtime.        Marland Kitchen DISCONTD: pantoprazole (PROTONIX) 40 MG tablet Take 1 tablet (40 mg total) by mouth daily.  30 tablet  11     Past Medical History  Diagnosis Date  . Hypertension   . Atrial fibrillation   . PE (pulmonary embolism)   . DVT (deep venous thrombosis)   . Meningioma   . BPH (benign prostatic hyperplasia)   . Colon polyps   . Venereal wart   . Depression   . Asthma   . Chickenpox   . Thrombophlebitis   . GERD (gastroesophageal reflux disease)   . Parkinson disease   . Dementia   . CAD (coronary artery disease)   . History of colonoscopy     Past  Surgical History  Procedure Date  . Hernia repair   . Tonsilectomy, adenoidectomy, bilateral myringotomy and tubes   . Varicose vein surgery     left leg  . Back surgery     herniated disc & remove bone spurs  . Coronary artery bypass graft     History   Social History  . Marital Status: Married    Spouse Name: N/A    Number of Children: N/A  . Years of Education: N/A   Occupational History  . Not on file.   Social History Main Topics  . Smoking status: Never Smoker   . Smokeless tobacco: Not on file  . Alcohol Use: No  . Drug Use: No  . Sexually Active:    Other Topics Concern  . Not on file   Social History Narrative  . No narrative on file    ROS: no fevers or chills, productive cough, hemoptysis, dysphasia, odynophagia, melena, hematochezia, dysuria, hematuria, rash, seizure activity, orthopnea, PND, pedal edema,  claudication. Remaining systems are negative.  Physical Exam: Well-developed well-nourished in no acute distress.  Skin is warm and dry.  HEENT is normal.  Neck is supple. No thyromegaly.  Chest is clear to auscultation with normal expansion.  Cardiovascular exam is regular rate and rhythm. 3/6 systolic murmur Abdominal exam nontender or distended. No masses palpated. Extremities show no edema. neuro grossly intact  ECG Sinus rhythm at a rate of 96. Left axis deviation. No ST changes.

## 2010-07-01 NOTE — Assessment & Plan Note (Signed)
Continue aspirin and statin. 

## 2010-07-01 NOTE — Assessment & Plan Note (Signed)
Blood pressure controlled on no medications. 

## 2010-07-01 NOTE — Assessment & Plan Note (Signed)
Symptoms have resolved.

## 2010-07-01 NOTE — Patient Instructions (Signed)
Your physician wants you to follow-up in: 6 MONTHS You will receive a reminder letter in the mail two months in advance. If you don't receive a letter, please call our office to schedule the follow-up appointment. 

## 2010-07-01 NOTE — Assessment & Plan Note (Signed)
No recurrences. Continue aspirin. Not a Coumadin candidate given the instability of gait.

## 2010-07-01 NOTE — Assessment & Plan Note (Signed)
Continue statin. Check lipids and liver. 

## 2010-07-01 NOTE — Assessment & Plan Note (Signed)
History of mitral valve repair. Continue SBE prophylaxis. Patient has residual mitral regurgitation. Repeat echocardiogram October 2012.

## 2010-07-02 ENCOUNTER — Other Ambulatory Visit: Payer: Self-pay | Admitting: Cardiology

## 2010-07-02 LAB — HEPATIC FUNCTION PANEL
AST: 14 U/L (ref 0–37)
Alkaline Phosphatase: 66 U/L (ref 39–117)
Bilirubin, Direct: 0.2 mg/dL (ref 0.0–0.3)
Indirect Bilirubin: 0.5 mg/dL (ref 0.0–0.9)
Total Bilirubin: 0.7 mg/dL (ref 0.3–1.2)

## 2010-07-03 ENCOUNTER — Encounter: Payer: Self-pay | Admitting: Cardiology

## 2010-07-06 ENCOUNTER — Telehealth: Payer: Self-pay | Admitting: Family Medicine

## 2010-07-06 ENCOUNTER — Encounter: Payer: Self-pay | Admitting: *Deleted

## 2010-07-06 NOTE — Telephone Encounter (Signed)
Needs new rx for Lorazepam .05mg  to [facility]---ph--- [phone]

## 2010-07-06 NOTE — Telephone Encounter (Signed)
Change the Lorazepam from 0.5 mg to 1 mg, to take one in am, one in afternoon, and 2 at bedtime. Call in #[phone] with 5 rf

## 2010-07-07 MED ORDER — LORAZEPAM 1 MG PO TABS: ORAL_TABLET | ORAL | Status: DC

## 2010-07-07 NOTE — Assessment & Plan Note (Signed)
OFFICE VISIT   JASMINE, MACEACHERN  DOB:  1940-07-22                                        September 29, 2009  CHART #:  [id number]   CURRENT PROBLEMS:  1. Status post multivessel coronary artery bypass graft and mitral      valve repair on September 01, 2009.  2. History of Parkinson disease.  3. History of paroxysmal atrial fibrillation.  4. Postoperative right pleural effusion requiring thoracentesis.   PRESENT ILLNESS:  The patient is a very nice 70 year old Caucasian male  who is being followed as an outpatient now after being discharged  following multivessel bypass grafting and mitral valve repair in early  July.  He is not a candidate for Coumadin due to his high risk for fall  due to his Parkinson disease and in fact has fallen at least twice at  home.  His surgical incisions fortunately are healing.  He still has  problems with lower extremity edema and a mild right pleural effusion.  His chest x-ray today, however, shows adequate aeration with a small  pleural effusion on the right side.  The sternal wires are intact, and  the patient has maintained a sinus rhythm.  On his last visit, he had  some painful urination; however, his urinalysis was clean and his white  count was normal.  Overall, he has shown improvement in that he is  walking more, sleeping better, and his appetite has improved.  He does  have some swelling of his right hand which is new and may be related to  his position in bed; however, an ultrasound of his right subclavian vein  was performed to rule out DVT and this was negative.   CURRENT MEDICATIONS:  1. Lasix 40 mg a day.  2. Potassium 20 mEq a day.  3. Aspirin.  4. Flomax.  5. Synthroid.  6. Diltiazem 30 b.i.d.  7. Carbidopa b.i.d.  8. Protonix 40 mg a day.  9. Trazodone.  10.Temazepam.  11.Lorazepam.  12.MiraLax.   PHYSICAL EXAMINATION:  Vital Signs:  Blood pressure is 90/60, pulse 80  and regular, respirations 18,  saturation 96% without oxygen.  His  temperature is 97.3.  The patient does have home oxygen in place and has  been on home oxygen even before the surgery.  General:  He appears to be  stronger, more interactive with a very bright affect.  Lungs:  Breath  sounds are clear.  Cardiac:  Rhythm is regular without murmur, and the  sternum is well healed.  He has some lower extremity 1+ edema.  The  saphenous vein harvest sites are healing.   IMPRESSION AND PLAN:  The patient has had a slow recovery following  surgery due to his underlying comorbid medical problems.  However, he is  slowly improving.  We will continue to follow the patient in the office.  His wife insists that the patient would be best cared for at home and we  have in place home health nursing and home physical therapy.   Kerin Perna, M.D.  Electronically Signed   PV/MEDQ  D:  09/29/2009  T:  09/30/2009  Job:  [id number]   cc:   [name], MD

## 2010-07-07 NOTE — Assessment & Plan Note (Signed)
OFFICE VISIT   [name], [name]  DOB:  10-25-1940                                        October 10, 2009  CHART #:  [id number]   HISTORY:  The patient is a 70 year old gentleman who is status post  coronary artery bypass grafting x2 as well as mitral valve repair and  oversewing of the left atrial appendage on September 01, 2009, by Dr. Donata Clay.  He was last seen in the office 2 days ago and at that time, he  was doing well.  However, approximately the past day he has developed  increasing erythema and induration on the right thigh region, just above  the site of the endoscopic vein harvest incision.  We asked him to come  to the office to have this evaluated.  It is somewhat painful to touch.  He has not had fevers.  He does feel somewhat poorly as compared to a  few days ago.   PHYSICAL EXAMINATION:  Vital Signs:  Temperature is 97.1, pulse is 86,  respirations 18, oxygen saturation is 96% on room air, and blood  pressure is 121/71.  General Appearance:  Well-developed adult male, in  no acute distress.  Extremities:  The right lower extremity is examined.  In the mid thigh portion, he shows some increasing induration and  erythema, just above the endoscopic vein harvest port incision.  It is  also warm to the touch.  It is also tender to touch.  There is no lower  extremity edema.  This is felt to be very consistent with a cellulitis.   PLAN:  The plan at this point will be to place him on oral Keflex for 7  days.  We will have him seen in the office next week by Dr. Donata Clay to  see how it is progressing.  Additionally, I will give him prescription  renewal for Ultram 50 mg 1 every 6 hours p.r.n. for pain as he has only  1 or 2 pain pills left.   Rowe Clack, P.A.-C.   Sherryll Burger  D:  10/10/2009  T:  10/11/2009  Job:  [id number]   cc:   Doylene Canning. Ladona Ridgel, MD

## 2010-07-07 NOTE — Discharge Summary (Signed)
[name], [name]              ACCOUNT NO.:  [id number]   MEDICAL RECORD NO.:  [id number]          PATIENT TYPE:  INP   LOCATION:  1327                         FACILITY:  [facility]   PHYSICIAN:  Rosalyn Gess. Norins, MD  DATE OF BIRTH:  1940-10-05   DATE OF ADMISSION:  04/08/2008  DATE OF DISCHARGE:                               DISCHARGE SUMMARY   ADDENDUM   The patient was slated for discharge on March 2.  Prior to discharge the  patient did have orthostatic vital signs checked, and did have a  significant drop in blood pressure to 85/60.  [name] carvedilol  was discontinued.   The patient has done well through the night.  His blood pressure has  remained stable.  This morning's blood pressure, April 24, 2008, revealed  a blood pressure of 97/60 in bed and sitting.  With the patient's blood  pressure being stabilized, he is now ready for discharge.  The only  change in the patient's discharge medications is to discontinue  Carvedilol.      Rosalyn Gess Norins, MD  Electronically Signed     MEN/MEDQ  D:  04/24/2008  T:  04/24/2008  Job:  [id number]

## 2010-07-07 NOTE — H&P (Signed)
NAMEJEZIEL, HOFFMANN              ACCOUNT NO.:  [id number]   MEDICAL RECORD NO.:  [id number]          PATIENT TYPE:  INP   LOCATION:  1418                         FACILITY:  [facility]   PHYSICIAN:  Georgina Quint. Plotnikov, MDDATE OF BIRTH:  08/22/1940   DATE OF ADMISSION:  04/08/2008  DATE OF DISCHARGE:                              HISTORY & PHYSICAL   CHIEF COMPLAINT:  None.   HISTORY OF PRESENT ILLNESS:  The patient is a 70 year old male with  advanced Parkinson's dementia who presents to [place] [facility] with 4 days'  history of being weak, shaky, not eating.  He was seen there yesterday.  The history is obtained from wife and emergency room records.  According  to wife, he has not been able to swallow starting this morning, and she  had to massage his throat to get the foot and pills down.  Since he was  getting progressively worse, they were seen again at [facility] ER  today.  The chest x-ray revealed possible aspiration pneumonia.  He was  sent to [facility] for admission.  When I examined him at around 6:00  p.m., he vomited 3 times:it is a new complaint.   PAST MEDICAL HISTORY:  1. Parkinson's disease.  2. Alzheimer's disease.  3. Atrial fibrillation.  4. Chronic heart murmur.  5. Gastric reflux.   ALLERGIES:  MOTRIN.   MEDICINES:  Protonix, zolpidem, Aricept, Risperdal, Coumadin Mirapex,  Effexor XR.   SOCIAL HISTORY:  He lives with wife.  His wife and family are taking  care of him at home.   FAMILY HISTORY:  Positive for Parkinson's disease.   REVIEW OF SYSTEMS:  As above.  The rest of the 18-point review of  systems is unobtainable or negative.   PHYSICAL EXAMINATION:  VITAL SIGNS:  Blood pressure 118/75, heart rate  94, respirations 15, temperature 100.2 rectal.  GENERAL:  He is in no acute distress.  He appears chronically ill.  HEENT:  With slightly dry mucosa.  He is tremulous, alert, not  cooperative, mute.  SKIN:  Clear.  NECK:  Supple.  No meningeal  signs.  LUNGS:  Decreased breath sounds at bases.  No wheezes or rales.  HEART:  With great 2/6 to 3/6 systolic murmur.  No gallop, tachycardic.  ABDOMEN:  Soft, nontender.  LOWER EXTREMITIES:  Without edema.  Pulses symmetric, normal.  SKIN:  Without rashes.  NEUROLOGIC:  Cranial nerves II-XII are grossly nonfocal.  However, hard  to assess.  Muscle strength is hard to assess.  Muscle rigidity is  noticeable with cogwheel rigidity present.   LABORATORIES:  CT of the brain is without acute changes.  Chest x-ray:  Right lower lobe pneumonia--possible infiltrate.  Urinalysis within  normal limits except for microscopic RBCs of 21-50.  Potassium 5.2,  creatinine 1, glucose 93.  Blood gas with pH 7.4, pCO2 of 38, pO2 of  333.  CBC normal.  INR 3.9.   ASSESSMENT/PLAN:  1. Fever likely due to problem #2.  2. Probable aspiration pneumonia with right lower lobe infiltrate.  IV      antibiotics.  3.  Dysphagia, either new or more obvious now  chronic problem.  Will      hold p.o. medications.  Intravenous fluids.  Speech therapy      consult.  4. Profound dementia.  May need to switch to Exelon patch.  5. Nausea/vomiting, new.  Will use promethazine or Zofran.  6. Dehydration.  Treat with intravenous fluids.  7. Hyperkalemia.  Will correct.  8. Atrial fibrillation on anticoagulation.  9. Hematuria, not new.      Georgina Quint. Plotnikov, MD  Electronically Signed     AVP/MEDQ  D:  04/08/2008  T:  04/08/2008  Job:  [id number]   cc:   Melvyn Novas, M.D.  Fax: [phone]   Tera Mater. [name], MD  [address] [place]  [place] [address]

## 2010-07-07 NOTE — Consult Note (Signed)
NAMEARMONTE, TORTORELLA              ACCOUNT NO.:  [id number]   MEDICAL RECORD NO.:  [id number]          PATIENT TYPE:  INP   LOCATION:                               FACILITY:  [facility]   PHYSICIAN:  Michael L. Reynolds, M.D.DATE OF BIRTH:  [birth year]   DATE OF CONSULTATION:  04/10/2008  DATE OF DISCHARGE:                                 CONSULTATION   CHIEF COMPLAINT AND REASON FOR CONSULTATION:  Seizure.   HISTORY OF PRESENT ILLNESS:  This is a pleasant 70 year old white male  who at time of interview was intubated.  According to his wife, the  patient has been sick for the past week, with climax being on April 07, 2008.  According to wife, the patient was having nausea, vomiting,  diarrhea, and flu-like symptoms for approximately a week to the point  where the patient became dehydrated.  The patient was then brought to  the new medical building from [facility] on [address], where the patient  was at that time examined and given Phenergan for his nausea and  vomiting and sent home.  Wife states that she had only given one dose of  Phenergan that night, on Sunday night.  Monday a.m., wife noticed that  husband was tachypneic and having difficulty expressing himself between  breaths.  In addition, she states that his parkinsonian symptoms had  increased, as they usually do, with his right arm tremor.  When  gathering more information on his past parkinsonian disease, the patient  has had both Parkinson and dementia for the past 4 years.  He has been  on Aricept and Mirapex, which had helped with his symptoms.  He has not  been placed on Sinemet as of yet as the patient does have hallucinations  with his parkinsonism.  On Monday, April 08, 2008, the patient was  brought to [facility] secondary to difficulty breathing,  worsening flu-like symptoms, and difficulty swallowing.  Wife states  that she had noticed that he was having difficulty getting his food and  pills down.   However, he was able to execute the action of swallowing,  however often times he would need to massage his throat or he would  cough up some of the food that he was ingesting.  For this reason, a  chest x-ray was taken, which showed positive for aspiration pneumonia.  In addition, Speech Therapy was consulted.  Since the patient has been  in the hospital, it was explained that the patient was n.p.o. and had  not been getting his Aricept nor his Mirapex.  On Tuesday, April 09, 2008, the patient was noticed to be at baseline with no abnormalities  noted.  The patient at that time had a bedside swallow screen evaluation  in which the impression is severe oral dysphagia noted with the patient  not manipulating food well, delayed swallowing noted after 5 minutes of  oral stasis, suspect spillage of oral bolus into pharynx and trachea.  At that time, he was placed on aspiration precautions.  This  morning on  April 10, 2008, the patient was  noticed to have a seizing episode  which lasted for approximately 2 minutes.  After speaking to nurse, it  was described as a myoclonic-like activity of the right arm, eyes  deviated to the left, no tongue-biting, urinary or bowel incontinence.  It was not a full tonic-clonic seizure, according to the nurses, his  legs are not involved and left arm was not involved.  Although the  patient did have a parkinsonian tremor that was notable on his right  hand, according to the nurse, this was definitive change in his tremor.  It was also noted that pupils were small and nonreactive.  He was not  responsive to any stimuli from external surroundings.  He was also noted  to be extremely diaphoretic at that time.  Vital signs were 100% on room  air, blood pressure 200/130, pulse being 148, and respirations 24.  At  that time, the patient was given 5 mg Valium IV x2, transferred to ICU,  Dilantin 1 g IV over 30 minutes was then administered, and then dosed  per  Pharmacy.  Secondary to the patient becoming tachycardic and  hypotensive, the patient was given a bolus of normal saline 500 mL and  then a second bolus approximately 10 minutes later.  Effexor, Ambien,  and guaifenesin were all discontinued.  EEG was ordered stat.  According  to wife, the patient has never experienced seizure activity, has no  history of seizure in his family, has no history of CVA or head trauma.  It was noted that the patient recently stopped significantly long-  duration use of Klonopin and started on Effexor XR 37.5 mg approximately  1 week ago.   The patient has been followed by [facility] by Dr. Porfirio Mylar  Dohmeier in the office, last visit was on November 21, 2007.  At that  time, he was being followed for his parkinsonism.  Previous to that, he  was being followed by Dr. Gershon Crane and a neurologist who  unfortunately both wife and husband were unable to remember the name of,  however, it was at [facility].  The  patient has been on Mirapex 0.55 mg with every meal at nighttime,  Aricept 10 mg daily, zolpidem 10 mg every night, and clonazepam 1 mg  t.i.d. for the treatment of his Parkinson syndrome since he has been  seen at [facility], and these medications were initiated at [facility].  According to the office note,  although the patient has been on these medications, his Parkinson  disease rating scale shows an increased stage of moderate to severe  impairment that is bilaterally seen, and he has also had a significant  cognitive component at that time.  As stated, much of his history is  obtained from past medical history as the patient is intubated.   PAST MEDICAL HISTORY:  1. Hypertension.  2. Parkinson disease.  3. Dementia.  4. Atrial fibrillation with chronic Coumadin, at this time Coumadin is      4.9.  5. GERD.  6. Benign prostatic hypertrophy.  7. Pulmonary  emboli.  8. Right lower extremity DVT.  9. Meningioma.   MEDICATIONS:  1. Protonix.  2. Zolpidem.  3. Aricept 10 mg.  4. Risperdal.  5. Coumadin.  6. Mirapex.  7. Effexor 37.5 mg daily.   No Sinemet at this time as the patient has hallucinations.   ALLERGIES:  MOTRIN.   FAMILY HISTORY:  Hypertension, CVA, and  CAD.   SOCIAL HISTORY:  The patient is married, lives with wife, and has 2  children.   REVIEW OF SYSTEMS:  All negative with the exception of above.   PHYSICAL EXAMINATION:  VITAL SIGNS:  At time of interview blood pressure  ranged from 105/65 to 83/50, with pulse ranging from 150-185;  respiratory rate, he is on the ventilator, 18, temperature afebrile.  MENTAL STATUS:  The patient is alert; however, he is intubated.  He can  carry out 1-step commands.  Cranial nerves:  Pupils are equal, round,  reactive, and accommodating to light; however, they are small at 2 mm to  1 mm.  He does not have a conjugate gaze.  [name] eyes tend to be  disconjugate and with the left tending to be superior lateral and the  right tending to go to inferior and medial.  Extraocular muscles are  grossly intact.  Visual fields, he blinks bilaterally.  Face is  symmetrical at this time.  Sensation V1 through V3 is intact.  Finger-to-  nose, heel-to-shin, fine motor movements, shoulder shrug, head turn was  unobtainable at this time.  Gait unobtainable.  Motor:  The patient  makes a fist of his left hand to command, raises his right arm to  command; however, as he does raise his right arm it is very stiff in an  extended manner with almost fine-hertz tremor and becomes very rigid  with stimulation.  When he does have myoclonus of his right arm, it is a  medium hertz, more of a myoclonic tremor than it is a parkinsonian  tremor.  The patient does grimace and withdraw from all 4 extremities  with pain, however, his left leg and left arm are most pronounced.  The  patient's only  spontaneous movement while in bed is with his right arm,  as stated his myoclonic-like tremor.  Tremor does decrease and stop when  the patient is not being stimulated.  Deep tendon reflexes:  Equivocal,  bilateral toes; equivocal Achilles reflex; 1+ throughout upper and lower  extremities.  PULMONARY:  Clear to auscultation bilaterally.  CARDIOVASCULAR:  S1 and S2.  Regular rate and rhythm.  Tachycardic at  times in the 180s.  NECK:  Negative for bruits and supple.  Sensation as stated above.   LABORATORIES:  PT is 50.0 and INR 4.9.  UA is negative.  Sodium 141,  potassium 3.8, chloride 102, CO2 is 20, BUN 16, creatinine 1.15, glucose  80, white blood cell count is 11.5, platelets 160, hemoglobin/hematocrit  12.3 and 36.0.   IMAGING AND TESTS:  CT of head that was done on April 10, 2008,  today, shows no mass.  No bleed.  Last carotid Doppler which was done on  September 08, 2007, showed no ICA stenosis.  Last 2D echo done in January  2009 showed overall left ventricular systolic function was normal; left  ventricular ejection fraction was estimated to be 50%; mild dilatation,  sinus of Valsalva; no mild aortic root dilatation; left atrium is mildly  dilated.   ASSESSMENT:  At this time, this is a 70 year old male with no previous  history of seizure.  He does have past medical history of Parkinson  disease, dementia, pulmonary embolism, DVT, chronic Coumadin, atrial  fibrillation now with a 1-week history of flu-like symptoms,  dehydration, nausea, vomiting, and new-onset seizure-like activity.  We  will obtain an EEG.  CT was negative.  We will continue Dilantin.  This  could very simply  be secondary to withdrawal from Klonopin, side effect  of Effexor, or secondary to the patient's dehydration and flu-like  symptoms.  We will continue to follow and provide information.  I will  discuss this with Dr. Thad Ranger.     ______________________________  [name], PA-C       Marolyn Hammock. Thad Ranger, M.D.  Electronically Signed    DS/MEDQ  D:  04/10/2008  T:  04/11/2008  Job:  [id number]

## 2010-07-07 NOTE — Assessment & Plan Note (Signed)
[facility]                            CARDIOLOGY OFFICE NOTE   [name], [name]                       MRN:          [id number]  DATE:11/22/2007                            DOB:          20-Apr-1940    PRIMARY CARE [name]:  [name]   REASON FOR PRESENTATION:  Evaluate the patient with atrial fibrillation.   HISTORY OF PRESENT ILLNESS:  The patient is a 70 year old gentleman who  presented on September 26, 2007, with a near-syncopal episode and was found  to be in atrial fibrillation with rapid ventricular response.  He  converted spontaneously.  He did have an echocardiogram, which  demonstrated posterior prolapse of the mitral leaflet and mitral  regurgitation and thought to be moderate.  He had already been on  anticoagulation and this was continued.   The patient has had 2 other episodes of atrial fibrillation.  This has  been 3 times now over 5 years.  Otherwise, he does not really notice  palpitations and does not have any presyncope or syncope.  He is very  limited by dementia and Parkinson disease.  He does not describe chest  discomfort, neck or arm discomfort.  He does not describe shortness of  breath, does not have PND or orthopnea.   Of note, the patient did have a catheterization with normal coronaries  in 2002.  He had a stress perfusion study in 2004 with no evidence of  ischemia or infarct.   PAST MEDICAL HISTORY:  1. Atrial fibrillation.  2. History of pulmonary embolism.  3. History of deep venous thrombosis.  4. Parkinson disease.  5. Meningioma.  6. Benign prostatic hypertrophy.  7. Colonic polyps.  8. Depression.  9. Asthma.  10.Allergic rhinitis.  11.Hypertension.  12.Gastroesophageal reflux disease.   PAST SURGICAL HISTORY:  1. Resection of the meningioma.  2. Right ear surgery.  3. Abdominal hernia repair.   ALLERGIES AND INTOLERANCES:  IBUPROFEN.   MEDICATIONS:  1. Coumadin.  2. Zolpidem.  3.  Trazodone 100 mg nightly.  4. Clonazepam 1 mg t.i.d.  5. Aricept 10 mg daily.  6. Risperidone 1 mg nightly.  7. Protonix 40 mg daily.  8. Mirapex.   SOCIAL HISTORY:  The patient is retired, married, and has 2 children.  He quit smoking 40 years ago.  He smoked packs per day.   FAMILY HISTORY:  Noncontributory.   REVIEW OF SYSTEMS:  Positive for headaches and dizziness, positive for a  fall yesterday (he actually said he hit his head that was latency  trauma.  He actually saw a neurologist yesterday after this), hiatal  hernia.  Negative for all other systems.   PHYSICAL EXAMINATION:  GENERAL:  The patient is in no distress.  VITAL SIGNS:  Blood pressure of 103/71, heart rate 98 and regular.  HEENT:  Eyes are unremarkable.  Pupils are equal, round, and reactive to  light.  Fundi not visualized.  Oral mucosa unremarkable.  NECK:  No jugular venous distention at 45 degrees.  Carotid upstroke  brisk and symmetrical.  No bruits.  No thyromegaly.  LYMPHATICS:  No  cervical, axillary, or inguinal adenopathy.  LUNGS:  Clear to auscultation bilaterally.  BACK:  No costovertebral angle tenderness.  CHEST:  Unremarkable.  HEART:  PMI not displaced or sustained.  S1 and S2 within normal limits.  No S3, no S4, no clicks, no rubs, no murmurs.  ABDOMEN:  Obese, positive bowel sounds, normal in frequency and pitch.  No bruits, rebound, guarding, or midline pulsatile mass.  No  hepatomegaly or splenomegaly.  SKIN:  No rashes.  No nodules.  EXTREMITIES:  A 2+ pulses.  No edema.  NEURO:  Oriented to person, place, and time.  Cranial nerves II through  XII grossly intact.  Motor grossly intact.   EKG, sinus rhythm, rate 95, axis within normal limits, mildly prolonged  QT, nonspecific inferior T-wave inversions.   ASSESSMENT AND PLAN:  1. Atrial fibrillation.  The patient has had now his third episode of      atrial fibrillation.  Managing him with any rate control medicines      preemptively  would be problematic because of low blood pressure and      orthostasis.  At this point, I have told his wife, if he develops      fibrillation again, he had to the emergency room and we would      consider further therapy such as antiarrhythmics or even ablation.      He is to remain on the Coumadin.  2. Mitral regurgitation.  The patient does have mitral regurgitation      identified.  He is not having any overt symptoms related to this.      We will keep an eye on this with a repeat echocardiogram in 6      months.  3. Orthostasis.  The patient has had a problem with this and probably      some autonomic dysfunction related to his Parkinson.  He did fall      yesterday, though this was not necessarily related to that.      However, he has multiple indications for his Coumadin and should      remain on this unless he becomes a very high fall risk.  4. Dementia per Dr. Clent [name].     Wayne Rotunda, [name], [facility]  Electronically Signed    [name]/MedQ  DD: 11/22/2007  DT: 11/23/2007  Job #: [id number]   cc:   Jeannett Senior A. Wayne Ridges, [name]

## 2010-07-07 NOTE — Consult Note (Signed)
NAMEMCCOY, TESTA              ACCOUNT NO.:  [id number]   MEDICAL RECORD NO.:  [id number]          PATIENT TYPE:  INP   LOCATION:  4708                         FACILITY:  [facility]   PHYSICIAN:  Pramod P. Pearlean Brownie, MD    DATE OF BIRTH:  Jul 13, 1940   DATE OF CONSULTATION:  09/07/2007  DATE OF DISCHARGE:                                 CONSULTATION   REFERRING PHYSICIAN:  Valerie A. Felicity Coyer, MD   REASON FOR REFERRAL:  Syncope.   HISTORY OF PRESENT ILLNESS:  [name] is a 70 year old Caucasian male  who has a known history of mild dementia as well as Parkinson disease  and is followed at [facility].  This morning, he woke up  and went to the bathroom and as he was getting up, he felt dizzy and  fell forward and hit his head on the sink.  He says his granddaughter  came to help him and when they tried to help him up from the toilet  floor, they noticed that his eyes rolled back in his head and he was  briefly unconscious for only a few seconds.  His face became pale.  There was no tonic-clonic activity, tongue bite, or incontinence.  He  remained confused for a few minutes and then recovered back to his  baseline.  There is no focal extremity weakness or numbness noted.  The  patient says he does get dizzy when he gets up off and on.  He has,  however, not had a major fall like this.  He was admitted with a near  syncopal episode in January 2009 and the symptoms of presentation was  similar, but workup at that time had shown deep vein thrombosis and  pulmonary embolism.  He was started on Coumadin for that.  His Parkinson  symptoms seem moderately controlled.  He still is slow to react and gets  up.  He walks for himself, but he has a stoop posture.  He has  intermittent tremors of his hands and face.  He takes Mirapex 0.25 three  times a day only.   PAST MEDICAL HISTORY:  1. Dementia.  2. Parkinson.  3. Depression.  4. Prostate hypertrophy.  5. Gastroesophageal  reflux disease.  6. Recent deep vein thrombosis.  7. Pulmonary embolism.   ALLERGIES TO MEDICATIONS:  None.   HOME MEDICATIONS:  1. Clonazepam.  2. Protonix.  3. Trazodone.  4. Ambien.  5. Finasteride.  6. Luvox.  7. Risperidone.  8. Coumadin.  9. Aricept.   SOCIAL HISTORY:  He lives with his wife in [place].  He is retired 3  years ago.  He does not smoke or drink.   FAMILY HISTORY:  Positive for father died with a stroke.   PHYSICAL EXAMINATION:  GENERAL:  An elderly Caucasian male, who is not  in distress.  He is awake and alert.  He is slow to respond to  questions.  VITAL SIGNS:  His blood pressure has been variable from 70 systolic to  108/65, heart rate 74 per minute and regular, respiratory rate 16, and  temperature 97.5.  CARDIAC:  Regular heart sounds.  He has ejection murmur over the apex.  LUNGS:  Clear to auscultation.  NEUROLOGICAL:  The patient is awake and alert.  He is oriented to time,  place, and person.  He is slow to respond to questions.  He has  diminished recall 2/3.  His animal naming is only 6.  He is unable to  copy intersecting pentagons.  His eye movements are full range.  There  is minimum restriction of upgaze.  Face is symmetric, but there is  decreased facial expression.  He has positive glabellar tap.  Tongue is  midline.  Motor system exam reveals no upper extremity drift, symmetric  upper and lower extremity strength.  The upper extremities have been  stronger than lower extremities.  He has no resting tremor, however, he  has a mild action tremor of the upper extremities.  The tremor actually  improved with intention.  Tremor is more prominent in the left hand than  the right.  He also has an intermittent head shaking tremor, which is  more obvious than is being examined.  He has cogwheel rigidity at both  wrists.  He has increased tone, and there is some rigidity in both legs.  Gait was not tested.   LABORATORY DATA:  Data reviewed.   CT scan of the head shows diffuse  bruising and swelling in the parietal and occipital scalp, but no acute  intracranial abnormalities noted.  There are mild changes of sinusitis  noted.   IMPRESSION:  A 70 year old gentleman with episode of syncope, likely due  to a combination of micturition syncope and baseline low blood pressure.  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   PLAN:  I agree with checking carotid Dopplers to rule out significant  extracranial stenosis, but I do not believe an MRI scan or EEG or other  further neurologic testing is indicated.  I have encouraged him to do  orthostatic tolerance exercises and to avoid getting up suddenly.  He  will stay on his present dose of Mirapex 0.25 mg 3 times a day and  follow up with his neurologist at [facility] for further neurological  followup for his Parkinson's.  Maintain adequate hydration status and  avoid medications which doubles blood pressure.           ______________________________  Wayne Shy P. Pearlean Brownie, MD     PPS/MEDQ  D:  09/07/2007  T:  09/08/2007  Job:  [id number]

## 2010-07-07 NOTE — Assessment & Plan Note (Signed)
OFFICE VISIT   [name], [name]  DOB:  08/27/1940                                        November 23, 2009  CHART #:  [id number]   HISTORY OF PRESENT ILLNESS:  The patient returns for his final office  and D of a right endovein thigh, tunnel abscess, which is now granulated  in and wound VAC is no longer required.  There is no further sign of  infection, and he has no signs of CHF.  He has maintained sinus rhythm,  and he is being followed by Dr. Larey Dresser and Dr. Olga Millers.  He  is living at home and has had no recent falls.  He was started on  midodrine for orthostatic dizziness and this has helped significantly.   PHYSICAL EXAMINATION:  Blood pressure 107/63, pulse 80, respirations 18,  saturation 97%.  He is alert and pleasant.  Breath sounds are clear and  equal.  The sternum is stable and well healed.  He has no murmur of MR.  The right thigh now is granulated in and is clean without cellulitis.  There are some excess granulation tissue and this was treated with a  topical silver nitrate and a saline wet-to-dry dressing.   PLAN:  The patient will continue home health nursing assistance with  wound care, but once the incision develops an eschar, then just routine  cleaning and skin care will be all that is needed.  He will return here  as needed.   Kerin Perna, M.D.  Electronically Signed   PV/MEDQ  D:  11/23/2009  T:  11/24/2009  Job:  [id number]

## 2010-07-07 NOTE — Assessment & Plan Note (Signed)
OFFICE VISIT   [name], [name]  DOB:  Apr 24, 1940                                        October 08, 2009  CHART #:  [id number]   CURRENT PROBLEMS:  1. 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  2. History of Parkinson disease.  3. History of paroxysmal atrial fibrillation, now sinus rhythm.  4. Postoperative right pleural effusion requiring thoracentesis, now      resolved.   PRESENT ILLNESS:  The patient returns for his final office visit 6 weeks  after multivessel bypass grafting and mitral valve repair for  degenerative prolapse of the posterior leaflet.  He is 70 years old and  presented with unstable angina, severe coronary disease, and severe  mitral regurgitation.  He has done well now at home for 3 weeks and has  not fallen.  He has lost his fluid weight with aggressive diuresis and  is maintaining sinus rhythm.  He has not been a candidate for Coumadin  due to his fall risk.  His Parkinson's has been well controlled and he  remains able to function at a high level of independent daily living  with daily walks with his wife.  He complains of being dizzy at times  and being cold intolerant.  He denies orthopnea, PND, angina.   PHYSICAL EXAMINATION:  Blood pressure 90/60, pulse 80 and regular,  respirations 18, saturation 95%, temperature 97.3.  He is alert and  pleasant.  Breath sounds are clear.  Cardiac rhythm is regular, and  there is no murmur of MR.  The sternum is stable and well healed.  A  left chest tube site has an eschar which is clean, debrided, and a Band-  Aid-Betadine dressing applied.  His peripheral edema now has completely  resolved.  His extremities are pink and warm.   DIAGNOSTIC TESTS:  PA and lateral chest x-ray shows significant  improvement in overall lung aeration with resolution of the right  pleural effusion.  Cardiac silhouette is decreased  in size, and the  sternal wires are all intact.   IMPRESSION AND PLAN:  The patient has done well now and will return to  the care of his primary care physician and this cardiologist.  Because  his blood pressure is low and his edema has resolved, we will stop his  Lasix and potassium.  His Cardizem is at the dose of only 30 mg twice a  day which is homeopathic and we will stop that time as well.  He was  told to take the Flomax at night to avoid dizziness early in the day as  well.  He understands the importance of maintaining his Synthroid,  carbidopa, aspirin, and his medications for anxiety.  Otherwise, the  patient will return here as needed as he is not a candidate for  outpatient cardiac rehab but will do his rehab at home and has physical  therapy on site already.   [name], M.D.  Electronically Signed   PV/MEDQ  D:  10/08/2009  T:  10/09/2009  Job:  [id number]   cc:   [name], MD  Arturo Morton. Riley Kill, MD, [facility]

## 2010-07-07 NOTE — Consult Note (Signed)
NAMEJARAN, [name]              ACCOUNT NO.:  [id number]   MEDICAL RECORD NO.:  [id number]          PATIENT TYPE:  INP   LOCATION:  1239                         FACILITY:  [facility]   PHYSICIAN:  Melvyn Novas, M.D.  DATE OF BIRTH:  Jun 15, 1940   DATE OF CONSULTATION:  10/09/2008  DATE OF DISCHARGE:                                 CONSULTATION   CHIEF COMPLAINT:  Exacerbation of Parkinson's.   HISTORY OF PRESENT ILLNESS:  This is a 70 year old Caucasian male with  known Parkinson's disease followed by Dr. Vickey Huger, [facility].  The patient had a scheduled appointment for August 30,  however, on August 11 the patient apparently was helping his wife carry  out the trash and upon walking back into the house tripped on a step and  fell to his right and backwards knocking the back of his head.  CT of his head was negative for any intracranial abnormalities, while  patient is on Coumadin and  has multiple bruised areas.  The patient was  admitted to [facility] where he was found to have multiple  bruises along with a UTI.  Wife states that over the past 3-4 weeks she has noticed that he has  become more lethargic.  His left postural tremor has increased and she  has now noted a slight head tremor.  She does state that he has been  walking 300 feet to the mailbox and 300 feet back to the house without  difficulty until August 11 when he had his fall.  Since the patient has started his carbidopa- levadopa the patient's wife  states it has helped him with his ambulation, however, swallowing has  still been difficult and posed a problem.    At the present time the patient is alert, oriented, comfortable.  He  follows commands.  He is appropriate.  He does have a positive left-  sided resting tremor and a slight head tremor along with a postural  tremor bilaterally left greater than right.   PAST MEDICAL HISTORY:  Positive for dysphasia, pulmonary embolism,  atrial fibrillation, dementia, Parkinson's disease, benign prostatic  hypertrophy, hypertension, aspiration pneumonia.   MEDICATIONS:  Include Prilosec, trazodone, Coumadin, baclofen, Cardizem,  Flomax, hydroxyzine, lorazepam, potassium chloride, promethazine,  Restoril, Roxanol, sertraline, amiodarone, digoxin, carbidopa levodopa  25/100 b.i.d. and omeprazole.   ALLERGIES:  Include MOTRIN and CODEINE.   SOCIAL HISTORY:  The patient lives with his wife.  He does not smoke,  drink or do illicit drugs.   REVIEW OF SYSTEMS:  Are negative with the exception of above.   PHYSICAL EXAM:  VITAL SIGNS:  Blood pressure is 114/59, pulse 69,  respirations 14, temperature 98.6, O2 sat 100% on room air.  MENTAL STATUS:  He is alert.  He is oriented x3.  Carries out two and  three step commands without any difficulty.  PULMONARY:  Clear to auscultation bilaterally.  No rhonchi or wheezing.  CARDIOVASCULAR:  Shows a systolic murmur, S1 and S2 are audible.  NECK:  Neck is negative for bruits and supple.  ABDOMEN:  Soft, nontender, nondistended.  NEUROLOGICAL:  Pupils are equal, round, reactive and accommodating to  light, conjugate gaze.  Extraocular muscles are intact.  Visual fields  are intact.  Face symmetric.  Tongue is midline.  Uvula is midline.  Facial sensation V1-V3 is full to pinprick, light touch.  Shoulder shrug and  head turn within normal limits.  Coordination:  Finger-to-nose is tremulous but no dyskinesia or ataxia  noted.  Heel-to-shin is slow but no dyskinesia or ataxia noted.  Fine motor movements and rapid alternating movements slow.  Gait:  deferrred.  Motor:  The patient moves all extremities with 4 plus /5 strength.  The  patient does have a postural tremor bilaterally in the upper  extremities, a resting tremor noted in his left upper extremity,  cogwheeling bilaterally in both wrists with left being greater than  right.    [name] tone was within normal limits  and bulk was within normal  limits.  Deep tendon reflexes:  2+ throughout the upper extremities, 1+ in the  patella and downgoing toes bilaterally.  Negative for drift upper and  lower extremities.  Sensation:  The patient is full to pinprick and light touch bilaterally.   LABS:  PT is 18.9, INR 1.6, PTT is 37.  UA is positive for ketones and  nitrites.  Sodium is 134, potassium 4.1, chloride 105, CO2 is 24, BUN  11, creatinine 1.35, glucose 117, platelets are 137.  White blood cell  count 6.3, hemoglobin and hematocrit 8.3 and 25.4.   IMAGING AND TESTS:  CT was negative.  MRI showed no acute process,  positive for nonspecific white matter changes.   ASSESSMENT:  This is a 70 year old male with Parkinson's disease and  positive urinary tract infection.  In addition, the patient has positive  sundowning and most likely what is Lewy body dementia.  Due to the  patient's recent fall and urinary tract infection there has been further  cognitive and physical decline to his progressive  neurological disorder.  At this time no further recommendations for  medication changes are recommended.  The patient should follow up with  Dr. Vickey Huger on regular scheduled office visit.   Thank you very much for this consultation.     ______________________________  [name], PA-C      Melvyn Novas, M.D.  Electronically Signed    DS/MEDQ  D:  10/09/2008  T:  10/09/2008  Job:  [id number]   cc:   Melvyn Novas, M.D.  Fax: [phone]   Dr Bonna Gains team

## 2010-07-07 NOTE — H&P (Signed)
[name], [name]              ACCOUNT NO.:  [id number]   MEDICAL RECORD NO.:  [id number]          PATIENT TYPE:  INP   LOCATION:  0101                         [facility]ILITY[facility]   PHYSICIAN:  Michiel Cowboy, MDDATE OF BIRTH:  10-06-1940   DATE OF ADMISSION:  05/07/2008  DATE OF DISCHARGE:                              HISTORY & PHYSICAL   PRIMARY CARE [name]:  Dr. [name].   CHIEF COMPLAINT:  Hematuria.   The patient is a 70 year old gentleman with a history of end-stage  Parkinson disease, atrial fibrillation and history of PEs.  The patient  was taking Coumadin for atrial fibrillation, as well as a history PEs in  August.  He has a history of BPH and indwelling Foley catheter today.  The patient is on hospice for Parkinson's.  Today, the nurse have come  down to exchanges the Foley.  This was supposed to be a routine  procedure.  When she was trying to place a Foley, the patient noticed  that they were not able it place successfully and was there was also a  lot of bleeding.  At this point, he presented to the emergency  department where a urology consult was done.  Dr. Aldean Ast had come  down and placed a 22-gauge Foley with success and was able to irrigate  and remove some clots from the bladder after which hematuria improved  dramatically.  [name] INR was noted to be 6.0, at which point I  discussed with the family what they wanted to do.  They was interested  in receiving FFP, and admission for monitoring.  The patient still  remains to be DNR/DNI, but not quite comfort care, though family seems  to be unsure of about and wanted to perhaps have more discussion about  how much care they were interested in and how much they were not.  Otherwise, recently about a week ago the patient had some episodes of  nausea and vomiting which has resolved.  No recent diarrhea.  He has  been treated recently with ciprofloxacin for UTI.  His INR was checked a  week ago and was  3.  Otherwise, review of systems negative.  No chest  pain, no shortness of breath.  No hematemesis, no melena, otherwise  unremarkable.   PAST MEDICAL HISTORY:  1. History of atrial fibrillation.  2. Remote history of recent aspiration pneumonia.  3. Fever secondary to aspiration pneumonia.  4. Chronic dysphagia.  5. Dementia __________.  6. Nausea and vomiting.  7. Dehydration.  8. Pulmonary embolus.   SOCIAL HISTORY:  The patient does not smoke or drink and does not use  drugs.  He lives at home with his wife who is his care [name].  The  patient is currently on hospice.  Dr. [name] is his hospice physician.   FAMILY HISTORY:  Noncontributory.   ALLERGIES:  MOTRIN CAUSING HIM NAUSEA AND ALSO CODEINE CAUSING HIM  NAUSEA, but he does respond well to Darvocet.   MEDICATIONS:  1. Pantoprazole 40 mg twice a day.  2. Aricept 5 mg daily.  3. Mirapex 0.5 mg p.o.  b.i.d.  4. The patient was taken Cipro, but currently per recommendation of      Dr. Aldean Ast, this will be discontinued  5. Lasix 40 mg daily.  6. Phenergan as needed.  7. Hydroxyzine 10 mg four times a day.  8. Coumadin 5 mg daily.  9. Ramipril 2.5 mg daily.  10.Zoloft 50 mg daily.  11.Diltiazem 30 mg twice a day.  12.Flomax 0.4 once a day.  Per Dr. Aldean Ast, this needs to be stopped      as well.  13.Lorazepam 0.5-1 mg every 6 hours.  The patient has been taking it      fairly regularly.  14.Potassium 20 mEq twice a day.  15.Trazodone 50 mg at bedtime.  16.Darvocet as needed for pain.   PHYSICAL EXAMINATION:  VITAL SIGNS:  Temperature 97.5, blood pressure  100/80.  The patient also has history of recurrent hypotension and per  his wife runs low blood pressures at home.  Pulse 102, respirations 20.  Saturating 98% on room air.  GENERAL:  The patient appears to be in no acute distress, laying down in  bed comfortably.  HEENT:  Head nontraumatic.  Moist mucous membranes.  LUNGS:  Somewhat diminished air  movement, probably secondary to poor  effort.  HEART:  Irregular, irregular, but no murmurs appreciated.  ABDOMEN:  Soft, nontender, nondistended.  A Foley catheter with pink  urine in place.  SKIN:  Appears to be clean, dry and intact.  LOWER EXTREMITIES:  Without clubbing, cyanosis or edema.  NEUROLOGICAL:  Tremor present throughout, not cooperate in performing a  neurological exam.   LABORATORY DATA:  White blood cell count 6.7, hemoglobin 11.9.  Sodium  135, potassium 3.4, creatinine 1.2.  UA showing large blood, otherwise  unremarkable.   No radiological studies obtained.   ASSESSMENT AND PLAN:  This is a 70 year old gentleman with a history of  atrial fibrillation and Parkinson's disease, as well as remote history  of pulmonary embolus for which he is on Coumadin.   1. Hematuria, likely secondary to a supratherapeutic INR.  Initially,      it was severe and had a lot of blood clots.  The patient was      written already for FFP, will continue for at least 2 units and      then recheck INR.  Since the patient does have a history of      pulmonary embolus, though this was in August, would need to      consider if he his a good Coumadin candidate.  This needs to be      further discussed with his primary care [name] and the family.  2. Hypotension.  Per family this is fairly chronic.  At discharge, his      blood pressure was in the low 90s.  We will hold hydroxyzine and      ramipril for now.  The patient does not have any fevers and does      not appear to be toxic.  3. Atrial fibrillation with heart rate somewhat increased.  Will      continue Diltiazem with holding parameters to 30 q.6.  We will need      to discuss if the patient should remain on Coumadin.  For right      now, we will hold off while he is supratherapeutic.  4. Benign prostatic hypertrophy.  Continue Foley.  Hold Flomax for now      while he has a Foley in.  5. Prophylaxis.  Protonix.  The patient is  already on Coumadin,      supratherapeutic.  6. Code status.  The patient and family wish to be do not      resuscitate/do not intubate.  He will need a palliative care      consult to clarify goals of care with family as they are unsure.  7. Parkinson's disease.  Continue home medications.      Michiel Cowboy, MD  Electronically Signed     AVD/MEDQ  D:  05/07/2008  T:  05/07/2008  Job:  [id number]   cc:   Tera Mater. [name], MD  [address] [place]  [place] [address]

## 2010-07-07 NOTE — Procedures (Signed)
EEG NUMBER:  [id number]   CLINICAL HISTORY:  A 70 year old man with history of Parkinson and  dementia, admitted 6/15 for pneumonia and dehydration, now suspect new  onset seizures in the setting of respiratory failure.  EEG is performed  for evaluation.  The patient is described as unresponsive.  This is a  portable EEG done at the bedside in the ICU.   DESCRIPTION:  There is no discernible background rhythm in this tracing.  The record consists primarily of a very low amplitude, mixed theta delta  rhythms of 3-6 Hz, which appear diffusely without abnormal asymmetry,  with rare and higher amplitude frontal slow waves of 102 Hz seen.  Intermixed throughout all this was low-amplitude fast activity in the 15-  25 Hz range, which appears diffusely, especially in central regions.  No  focal slowing is noted and no epileptiform discharges were seen.  Single  channel devoted to EKG revealed irregular heart rhythm throughout, with  a rate of approximately 132 beats per minute.   CONCLUSION:  Abnormal study due to the presence of:  1. Severe generalized slowing and depression of background rhythms,      findings suggestive of diffuse widespread cerebral dysfunction      inconsistent with a severe encephalopathic state.  2. Excess beta activity, as this is commonly seen in individuals      receiving sedatives.  3. Irregular and rapid heart rhythm.  No focal slowing is noted and no      epileptiform discharges were seen.      Michael L. Thad Ranger, M.D.  Electronically Signed     ZOX:WRUE  D:  04/10/2008 19:54:37  T:  04/11/2008 03:31:37  Job #:  [id number]

## 2010-07-07 NOTE — Discharge Summary (Signed)
[name], [name]              ACCOUNT NO.:  [id number]   MEDICAL RECORD NO.:  [id number]          PATIENT TYPE:  INP   LOCATION:  5151                         FACILITY:  [facility]   PHYSICIAN:  Valerie A. Felicity Coyer, MDDATE OF BIRTH:  15-Sep-1940   DATE OF ADMISSION:  02/22/2007  DATE OF DISCHARGE:  02/27/2007                               DISCHARGE SUMMARY   DISCHARGE DIAGNOSES:  1. Bilateral lower extremity deep vein thromboses and bilateral      pulmonary emboli status post anticoagulation with overlap,      discharge INR 2.7.  2. Parkinson's disease, new diagnosis, outpatient follow-up [facility]      neurology as prior to admission.  3. Alzheimer's disease complicated by above.  4. Benign prostatic hypertrophy, outpatient follow-up with urology as      prior to admission.  Normal PSA this admission.  5. Hypertension, stable off of home Toprol, continue to hold at this      time until follow-up with primary medical doctor.  6. Right pleural effusion reactive to pulmonary embolism, cytology      negative for malignancy.  No evidence of infection.  7. Abnormal liver function tests, no abnormality on CT.  8. Gastroesophageal reflux disease.  9. Cardiac murmur with unremarkable 2-D echocardiogram.  10.Positive PTTLA titers, question significant to problem #1 above.      Outpatient hematology follow-up as needed.   DISCHARGE MEDICATIONS:  Include:  1. Coumadin 5 mg once daily or as directed by Dr. [name].  2. The patient is to discontinue Toprol XL 50 mg at this time due to      normotensive pressures, holding this medication.   Other medications are as prior to admission and include:  1. Protonix 40 mg b.i.d.  2. Klonopin 1 mg q.i.d.  3. Flomax 0.4 mg once daily.  4. Proscar 5 mg once daily.  5. Aricept 10 mg once daily.  6. Trazodone 100 mg every night.  7. Ambien 10 mg every night p.r.n.  8. Risperdal 1 mg every night.  9. Luvox CR 100 mg half tablet daily.   DISPOSITION:  The  patient is discharged home with maximal home health to  include PT, OT, bath aide and R.N. for monitoring of INR.  Home INR will  be checked 48 hours after discharge, January 7, and called to primary  care physician, Dr. Gershon Crane, for further management of  anticoagulation issues.  Hospital follow-up will also be with primary  M.D., Dr. [name], in the office Monday January 12 at 9:45 a.m. for recheck  of INR as well as hospital follow-up and further Coumadin management.  Continue to follow-up at [facility] neurology for evaluation and treatment  of Parkinson's disease as ongoing prior to admission.   CONDITION ON DISCHARGE:  Medically stable.  Hemodynamically normal with  O2 saturations normal range this hospitalization on room air without  compromise.   HOSPITAL COURSE:  1. Bilateral deep vein thromboses and bilateral pulmonary emboli.  The      patient is a 70 year old gentleman is largely immobile due to the      new diagnosis  of Parkinson's disease with Alzheimer's with workup      and evaluation ongoing at [facility].  He had been tried      briefly on Sinemet with poor tolerance and at this time is on no      other formal treatment.  He was evaluated by his primary M.D. on      the day of admission due to swelling of the right lower extremity      with increasing pain over the past 3 days and was sent to the      vascular lab for further evaluation, when it was called that there      was extensive DVT in his right lower extremity as well as      nonocclusive DVT in the left lower extremity.  He was then referred      from the vascular lab to inpatient for hospital admission.      Anticoagulation was undertaken with oral Coumadin and full-dose      Lovenox since the time of admission.  Management of these has been      per pharmacy, though his primary risk factor appears to be that of      immobility with neurologic disease, having no previous history of      clot.   Hypercoagulable panel was also sent.  This was largely      unremarkable except for the presence of a positive titer for PTTLA.      As his current management with full anticoagulation will not be      changed by these results, no hematology consult has been pursued      this hospitalization with further outpatient follow-up at the      conclusion of 6-12 months of therapy to consider his long-term      needs.  Would recommend hematology evaluation or discussion by      primary M.D. on this issue.  As stated per pharmacy protocol, he      has been managed with full-dose Lovenox and anticoagulation.  He      has been bridged with greater than 48-hour overlap with Lovenox,      waiting for a therapeutic INR, which has been maintained.  Pharmacy      has recommended a discharge dose of 5 mg daily with close      outpatient INR follow-up which has been arranged for 48 hours after      discharge.  His INR will be called to primary M.D. by home health      nursing for further instructions of Coumadin management.  Also to      tie together all of this, a follow-up in the primary M.D. office      has been arranged for 1 week from today for recheck of INR as well      as hospital follow-up.  The patient and his wife has received      extensive education regarding Coumadin education and monitoring and      feel comfortable with these plans for discharge and follow-up.  2. Other medical issues.  [name] other medical issues are      chronic and as listed above.  We have not given his Toprol during      this hospitalization due to low normal pressure in the setting of      bilateral PEs.  His blood pressure at this time off Toprol as      120/72 with pulse  of 68, and we will continue to hold Toprol XL      until further follow-up with primary M.D.  Given his immobility,      this medication may not be necessary at this time.  Also in regards      to his Parkinson's medications, he remained off  any form of      treatment at this time due to intolerance of Sinemet trial prior to      admission and has close follow-up arranged at [facility].  He was seen      by speech therapy as well as PT and OT this hospitalization.  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  PT and OT have      recommended home health with appropriate DME which has also been      arranged prior to this discharge.      Valerie A. Felicity Coyer, MD  Electronically Signed     VAL/MEDQ  D:  02/27/2007  T:  02/27/2007  Job:  [id number]

## 2010-07-07 NOTE — Assessment & Plan Note (Signed)
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  The thigh was I and D'd, debrided,  and a wound VAC has been in place, managed by the home health care  nurses.  He finished a course of oral antibiotics for the MRSA positive  culture.  It is now granulating clean without cellulitis and he has no  fever.  His only complaint at this visit is orthostatic hypotension  which caused him to pass out once.  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  His Lasix was  discontinued by his cardiologist Dr. Jens Som.  He has maintained a  sinus rhythm, he has no symptoms of CHF, but he is generally weak  related to his major surgery and underlying Parkinson disease.   PHYSICAL EXAMINATION:  Vital Signs:  Blood pressure 100/60, pulse 84 and  regular, and saturation on room air 94%.  Lungs:  Breath sounds are  clear.  Cardiac:  Rhythm is regular and there is no murmur.  The sternum  is well healed.  The right thigh wound is contracting and granulating.  It is clean without cellulitis.  It is packed with a single 2 x 2 gauze  wet-to-dry and the wound VAC system has been removed and will be  discontinued.   PLAN:  The patient will transition to a wet-to-dry dressing change on a  b.i.d. schedule with the home health nurses involved.  I have given a  prescription for midodrine 10 mg b.i.d. for the orthostatic hypotension  from autonomic insufficiency and have given him return appointment in 2  weeks.   Kerin Perna, M.D.  Electronically Signed   PV/MEDQ  D:  11/05/2009  T:  11/06/2009  Job:  [id number]   cc:   Madolyn Frieze. Jens Som, MD, [facility]

## 2010-07-07 NOTE — Discharge Summary (Signed)
[name], [name]              ACCOUNT NO.:  [id number]   MEDICAL RECORD NO.:  [id number]          PATIENT TYPE:  INP   LOCATION:  1327                         FACILITY:  [facility]   PHYSICIAN:  Rosalyn Gess. Norins, MD  DATE OF BIRTH:  11-01-1940   DATE OF ADMISSION:  04/08/2008  DATE OF DISCHARGE:  04/23/2008                               DISCHARGE SUMMARY   ADMISSION DIAGNOSES:  1. Fever.  2. Probable aspiration pneumonia with right lower lobe infiltrate.  3. Chronic dysphagia.  4. Dementia with Parkinson's features.  5. Nausea and vomiting.  6. Dehydration.  7. Hyperkalemia.  8. Atrial fibrillation.  9. Parkinson's disease.   DISCHARGE DIAGNOSES:  1. Aspiration pneumonia, resolved.  2. Dysphagia, chronic.  3. Dementia, stable.  4. Parkinson's disease, stable.  5. Dehydration, resolved.  6. Hyperkalemia, resolved.  7. Atrial fibrillation, stable.  8. Hypoxemic event, resolved.  9. Fluid overload/pulmonary edema, resolved with good diuresis.   CONSULTANTS:  1. Charlaine Dalton. Sherene Sires, MD for Critical Care Pulmonary Service.  2. Wilson Singer, MD for [facility]      [place].  Consult for Hospice appropriateness.  3. Marolyn Hammock. Thad Ranger, MD for Neurology consultation.   PROCEDURES:  1. Chest x-ray at admission April 08, 2008 which showed borderline      cardiomegaly, no congestive failure, cannot rule out lower lobe      pneumonia.  2. CT of the brain without contrast which showed no acute intracranial      findings or mass lesions.  3. Chest x-ray April 10, 2008 post-intubation which showed      endotracheal tube above the carina.  Negative for pneumothorax.      Right mid and lower lung atelectasis.  Developing left lower lobe      airspace disease.  4. CT without contrast of the brain April 10, 2008 following      neurologic event with no intracranial findings.  5. Chest x-ray April 11, 2008 showed left IJ catheter in good  position.  No pneumothorax.  Worsening bibasilar airspace disease      particularly on the right  6. Chest x-ray April 12, 2008 showed no interval change.  7. Chest x-ray April 15, 2008 which showed interval extubation.      Slight increase in left lower lobe atelectasis.  8. Swallowing function study to be read by Speech Pathology.  9. Chest x-ray April 19, 2008 which showed progressive asymmetric      airspace disease suggestive of pulmonary edema or bilateral pleural      effusions.  10.Chest x-ray April 22, 2008 which showed airspace disease, left lower      lobe atelectasis and consolidation.  11.The patient was intubated on April 10, 2008 and extubated on      April 12, 2008.   HISTORY OF PRESENT ILLNESS:  Patient is a 70 year old gentleman with  advanced Parkinson's disease with Parkinson's dementia who presented to  the [facility] Emergency Department with a 4 day history of weakness,  shakiness and not eating.  He was seen at that ER on the day  prior to  admission.  According to the patient's wife, the patient not been able  to swallow beginning the morning of admission.  He was seen again at  [facility] ER.  X-ray revealed possible aspiration pneumonia.  He was  referred to [facility] for admission.   Please see Dr. Loren Racer admission note for past medical history,  family history, social history and admission examination.   HOSPITAL COURSE:  1. The patient was admitted to a regular floor.  He was started on      appropriate antibiotic coverage for aspiration pneumonia with      Avelox.  On this regimen, the patient seemed to do relatively well.      The patient on April 10, 2008 did have an episode of what      appeared to be seizure-like activity and prolonged apneic episodes      which may have been more related to a primary pulmonary problem.      The patient did require intubation.  He did have bibasilar      atelectasis and airspace  disease.  He was continued on antibiotic      therapy.  The patient was successfully extubated on April 12, 2008.  He was continued on antibiotics for his bibasilar disease.      He had been switched to Zosyn.  The patient completed 7 days of      Zosyn with resolution of fever and it was felt he had resolution of      his pneumonia.  Also respiratory failure was resolved.  2. Pulmonary.  Patient with an event on April 10, 2008 which      appeared to be apneic episodes and possible seizure disorder.  He      was significantly hypoxemic and did require intubation.  Please see      above.  The patient was managed in the Critical Care Unit from      April 10, 2008 to April 12, 2008 and successfully extubated.      The patient had progressive improvement.  However, the patient was      noted to have increased respiratory difficulty on April 19, 2008      and question of possible fluid overload.  The patient had a mildly      elevated BNP.  Chest x-ray did suggest consolidation in airspace      disease.  The patient was vigorously diuresed as well as being      started on Carvedilol, furosemide and  ACE inhibitor.  On this      regimen, the patient had approximately an 8 liter diuresis with      marked improvement in his respiratory function.  The patient for      the last 36 hours has been stable with no increased work of      breathing.  Oxygen saturations have been excellent and the patient      has no complaints of shortness of breath.  At this point, the      patient seems stable from a respiratory and pulmonary perspective,      and will continue on his medical regimen as outlined above.  3. Gastrointestinal:  The patient did have a problem with some loose      stools.  He had no evidence of C. difficile colitis.  Stool for C.      diff was negative x3 on April 15, 2008, April 16, 2008 and      March 27, 2008.  The patient was able to take a diet without       abdominal pain or discomfort.  He had no further diarrhea.  4. Hypokalemia.  The patient did have some transient hypokalemia which      was replaced with IV potassium, then oral potassium and stabilized.  5. History of pulmonary edema and also atrial fibrillation.  The      patient has been resumed on Coumadin therapy and continues on      therapy.  His final INR from the day of discharge was 1.6 mildly      subtherapeutic.  Will continue on oral Coumadin replacement with a      goal of 2.0-2.5.  PT's will be monitored by [facility] health.  6. Parkinson's disease.  The patient has significant end-stage      Parkinson's disease along with Parkinson's dementia.  He was doing      well in the hospital.  He was able to work with physical therapy.      He was able to ambulate 100 feet with the use of a walker and      standby assistance.   DISPOSITION:  [name] medical problems having stabilized, at this  time he is felt to be ready for discharge home.   PLAN:  1. The plan is for him to be followed by [facility] [place].  Durable medical equipment has been ordered for the      patient, a comprehensive package number 1.  2. We will notify Hospice at the time of the patient's discharge so      that they can assume his care.  [name] primary care      physician is Dr. [name] who I assume will be the  patient's Hospice attending with co-management with Hospice medical  staff.   PHYSICAL EXAMINATION:  VITAL SIGNS on discharge:  Temperature of 98.1,  blood pressure 103/71, heart rate 73, respirations were 20, O2  saturation after being on room air for 30 minutes was 96%.  GENERAL  APPEARANCE:  This is a pleasant large-boned Caucasian gentleman lying in  bed who is in no acute distress.  He is responsive appropriately to  questions and follows instructions.  HEENT:  Normocephalic, atraumatic with no signs of trauma.  Conjunctivae  and sclerae were  clear.  NECK:  Supple.  CHEST:  Patient has got no CVA tenderness.  LUNGS:  Patient is moving air well with no rales, wheezes, no rhonchi  appreciated.  There is no increased work of breathing.  There is no use  of accessory musculature.  CARDIOVASCULAR:  2+ radial pulses.  Precordium was quiet.  His heart rate was irregularly irregular with a  slow controlled rate.  The patient had a very prominent 3/6 systolic  murmur heard best at the lower left sternal border consistent with  tricuspid valve stenosis.  He had 2/6 systolic murmurs at the right and  left sternal border either radiation or suggestive of other valvular  calcification.  The patient had no JVD.  ABDOMEN:  Soft with positive bowel sounds.  No guarding or rebound was  elicited.  GENITALIA:  Was not examined.  EXTREMITIES:  The patient has no clubbing, cyanosis, no edema is noted.  He has no deformities.  NEURO:  The patient has a significant resting low-frequency tremor.  He  does have some  stiffness in movement.  He does have a flat affect  consistent with Parkinson's facies.  His exam was otherwise nonfocal.  DERMATOLOGIC:  The patient had no skin lesions or breakdowns noted on  head, neck, chest, back, legs or arms.  Sacrum was not well visualized.   LABORATORY DATA:  Final laboratory:  INR was 1.6.  Final metabolic panel  from April 20, 2008 with sodium 138, potassium 4.0, chloride 104, CO2  28, BUN 10, creatinine 0.82, glucose was 99.  BNP from April 20, 2008  was 254.  BNP from April 19, 2008 was 361.   DISCHARGE MEDICATIONS:  Per the MAR:  1. Trazodone 50 mg q.h.s.  2. Aricept 5 mg daily.  3. Diltiazem 30 mg q.12 h.  4. Mirapex 0.5 mg q.h.s.  5. Flomax 0.4 mg daily.  6. Potassium 20 mEq b.i.d.  7. Sertraline 50 mg daily.  8. Carvedilol 3.125 mg b.i.d.  9. Ramipril 2.5 mg daily.  10.Furosemide 40 mg daily.  11.Protonix 40 mg b.i.d.  12.Coumadin 5 mg daily with a pro-time to follow.  13.Phenergan  12.5 mg p.o. or 25 mg p.r. suppositories as needed for      nausea.  14.Darvocet-N 100 one q.6 h. p.r.n. pain.  15.Thick-It instant food thickener to foods to obtain a nectar-thick      consistency.  16.Ativan 0.5 mg q.4 h. p.r.n.  17.Maalox Suspension for management of bowel habit as needed.   DISPOSITION:  The patient will be discharged to home with [facility] for followup.   DISCHARGE INSTRUCTIONS:  1. He will need to have a pro-time checked in 3 working days.  2. The patient will return to see Dr. [name] on an as needed basis.   CONDITION ON DISCHARGE:  Stable.      Rosalyn Gess Norins, MD  Electronically Signed     MEN/MEDQ  D:  04/23/2008  T:  04/23/2008  Job:  [id number]   cc:   Melvyn Novas, M.D.  Fax: [phone]   Tera Mater. [name], MD  [address] [place]  [place] [address]

## 2010-07-07 NOTE — Assessment & Plan Note (Signed)
OFFICE VISIT   [name], [name]  DOB:  [birth year]                                        September 22, 2009  CHART #:  [id number]   REASON FOR OFFICE VISIT:  Painful urination and edema.   HISTORY OF PRESENT ILLNESS:  This is a 70 year old Caucasian male with a  past medical history of coronary artery disease, moderate-to-severe  mitral regurgitation, BPH, recurrent UTIs, hypothyroidism, dementia, and  Parkinson's, who initially presented to [facility] on August 27, 2009, with  complaints of substernal chest discomfort.  The patient ruled out for  acute myocardial infarction; however, he did undergo a cardiac  catheterization which showed an EF of 55% to 60%, coronary artery  disease, and severe mitral regurgitation.  A 2-D echocardiogram was then  obtained which confirmed severe MR.  A cardiothoracic consultation was  obtained with Dr. Donata Clay, and after the patient was seen by a  neurologist (as he has a history of Parkinson's and dementia), he then  underwent CABG x2 and mitral valve repair by Dr. Donata Clay on September 01, 2009.  Postoperatively, the patient was found to have a large right  pleural effusion and underwent a right thoracentesis on September 10, 2009,  and approximately 1 L of blood-tinged fluid was removed.  He also did  have postoperative atrial fibrillation.  At one time, he was on  Lopressor, digoxin, and Cardizem.  Digoxin was discontinued prior to his  discharge.  According to medical records, he is supposed to be taking  Lopressor 12.5 mg p.o. 2 times daily; however, the wife states that this  was stopped prior to his discharge.  He was also given several days of  Lasix and potassium for volume overload.  The patient was eventually  discharged on September 16, 2009.  [name] wife contacted our office  regarding the patient's complaint of painful urination and edema.  He  complaints of fatigue and occasional pain with ambulation of the right  lower extremity.  The patient denies any shortness of breath, chest  pain, fever, or chills.   PHYSICAL EXAMINATION:  General:  This is a pleasant 70 year old  Caucasian male who is in no acute distress who is accompanied by his  wife who is alert, oriented, and cooperative.  Vital Signs:  BP 93/60,  heart rate 74, respirations 16, O2 sat 91% on room air.  Cardiovascular:  Regular rate and rhythm.  No murmurs, gallops, or rubs.  Pulmonary:  Decreased at the bases, right greater than left.  No rales, wheezes, or  rhonchi.  Abdomen:  Soft, nontender, slight distention.  Bowel sounds  present.  Extremities:  No cyanosis or clubbing bilaterally; however,  there is 2+ edema right greater than left.  The patient also has edema  localized to the posterior aspect of the right hand.  Motor, sensory,  and neuro is intact of the upper and lower extremities.   DIAGNOSTIC TESTS:  Chest x-ray done today showed bilateral pleural  effusions, increased on the right and decreased on the left, bibasilar  mid lower lung airspace disease, right greater than left.  The patient  has had a CBC, BMET, urinalysis done today.  CBC reveals a white count  down to 7200, H and H 9.8 and 31.8 respectively, platelet count of  388,000.  BMET done showed the  sodium to be 136, potassium to be 4.6,  BUN and creatinine were 12 and 1.3.  UA showed a specific gravity of  1.0, it was yellow and clear, negative for glucose, bili, and  leukocytes.   ASSESSMENT AND PLAN:  The patient has already had both Lasix and  potassium called into the pharmacy.  He is to continue this until  instructed otherwise.  In addition after having spoken with Dr. Donata Clay, we are going to give him Zaroxolyn 5 mg p.o. daily for 3 days.  He should take this prior to taking his Lasix.  We will double check to  see if urine culture has also been sent, although the urinalysis is  negative.  Wife is instructed to contact us if he develops any fever  or  chills.  Regarding the patient's right posterior hand edema questionable  etiology, his right upper extremity is not more swollen than his left.  His motor, sensory, and neuro are intact.  Swelling of the right lower  extremity is a site of saphenous vein harvest.  If he has any increasing  tenderness or erythema or warmth, he is to contact our office  immediately so we can order an ultrasound of the right lower extremity  to rule out deep vein thrombosis, although at this time this is  unlikely.  Regarding the patient's pleural effusions, he is to continue  taking his Lasix and potassium.  He was given a prescription for  Zaroxolyn which he is to take 5 mg p.o. daily for 3 days prior to taking  his Lasix.  We will obtain a PA and lateral chest x-ray prior to his  next office visit which is going to be with Dr. Donata Clay on September 24, 2009.  As previously stated, the patient's urinalysis was essentially  negative.  We will also see if an urine culture has been obtained.  The  patient's wife will be instructed to contact us if the patient develops  any fever or chills.  At this time, there is no indication for  antibiotics.  Finally, regarding the patient's labile blood pressure,  according to the wife he was only discharged home on Cardizem 30 mg p.o.  b.i.d. which as previously stated he was given for his atrial  fibrillation.  He appears to be in sinus rhythm at this office visit.  An EKG was obtained, although interpretation is difficult based on the  tracings.  [name] wife states she is able to take the patient's  blood pressure with a home machine, she was instructed to do so.  She  was instructed that if his systolic blood pressure was 100 or less that  he is to only be given Cardizem once a day and we will have to reassess  this up on his next visit.  The patient does have a follow up to see Dr.  Riley Kill some time this month.   Kerin Perna, M.D.  Electronically  Signed   DZ/MEDQ  D:  09/22/2009  T:  09/23/2009  Job:  [id number]   cc:   Arturo Morton. Riley Kill, MD, [facility]

## 2010-07-07 NOTE — Consult Note (Signed)
NAMEEBUBECHUKWU, JEDLICKA              ACCOUNT NO.:  [id number]   MEDICAL RECORD NO.:  [id number]          PATIENT TYPE:  INP   LOCATION:  1238                         [facility]ACILITY:  [facility]   PHYSICIAN:  Casimiro Needle L. Reynolds, M.D.DATE OF BIRTH:  1940/10/08   DATE OF CONSULTATION:  DATE OF DISCHARGE:                                 CONSULTATION   ADDENDUM:   REFERRING PHYSICIAN:  Michael B. Sherene Sires, MD, FCCP.   REASON FOR CONSULTATION:  New onset seizures.   HISTORY AND EXAMINATION:  Please see the accompanying PA note by [name], PA-C for pertinent history and examination findings.  I reviewed  these in some detail.  At the time of my examination, the patient is  intubated and sedated, but will briefly arouse and will follow some  commands.  He looks left and right well.  His face is symmetric.  His  has diffuse cogwheeling rigidity.  He has a fairly persistent resting 6-  8 Hz tremor of the right greater than left upper extremities.  His tone  is increased, more on the right than on the left.  He is able to move  all extremities voluntarily with good strength.  His EEG is consistent  with diffuse sedation, but is not way epileptiform.  He had a CT of the  head earlier today which was negative for acute findings.   IMPRESSION:  Although we though that he had a seizure today, I am not  convinced that this is the case.  I think what was seen today by the  nursing staff was an acutely ill man with a heart rate in the 140s,  suffering from pneumonia, who, due to increased sympathetic tone  appeared to have a more severe tremor which was interpreted as seizure  activity.  This also could have made him delirious, and explain his  altered mental status.  Even if he did in fact have true seizure  activity, it is quite likely that this occurred as a result of his acute  illness, and I doubt he has epilepsy which will need to treated long  term.   RECOMMENDATIONS:  I would continue with the  Mirapex and provide it by a  tube.  Would hold other psychoactive medications.  Would hold Dilantin,  as I am not sure he is going to need this long-term.  Also, he needs  treatment for his medical conditions, specifically his aspiration  pneumonia, his profound tachycardia, and other issues.  We will continue  to follow from a neurologic perspective.      Michael L. Thad Ranger, M.D.  Electronically Signed     MLR/MEDQ  D:  04/10/2008  T:  04/10/2008  Job:  [id number]

## 2010-07-07 NOTE — Discharge Summary (Signed)
[name], [name]              ACCOUNT NO.:  [id number]   MEDICAL RECORD NO.:  [id number]          PATIENT TYPE:  INP   LOCATION:  6526                         FACILITY:  [facility]   PHYSICIAN:  Valerie A. Felicity Coyer, MDDATE OF BIRTH:  November 14, 1940   DATE OF ADMISSION:  09/26/2007  DATE OF DISCHARGE:  09/27/2007                               DISCHARGE SUMMARY   PRIMARY CARE PHYSICIAN:  [name], MD   DISCHARGE DIAGNOSES:  1. Near syncope.  2. Atrial fibrillation with rapid ventricular response.  3. Parkinson's disease with associated autonomic dysfunction and      dementia.   HISTORY OF PRESENT ILLNESS:  [name] is a 70 year old white male with  past medical history of Parkinson's disease recently hospitalized from  July 16 to July 17 for recurrent syncope in setting of hypotension.  The  patient reported to [facility] ED on day of admission with reports of  dizziness and lightheadedness with near syncope.  The patient denied any  palpitations, chest pain, shortness of breath, nausea, vomiting,  headache.  He was found to be in atrial fibrillation with rapid  ventricular response upon arrival to the emergency room.  However, at  the time of admission exam the patient had converted to a normal sinus  rhythm after receiving IV diltiazem bolus.  The patient was admitted at  that time for further evaluation and treatment.   PAST MEDICAL HISTORY:  1. Parkinson's disease followed at [facility].  2. Questionable autonomic dysfunction with Parkinson's disease.  3. BPH.  4. History of right brain meningioma.  5. Bilateral PE/DVTs in January 2009.  6. 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  7. GERD.  8. Remote history of atrial fibrillation postop.   HOSPITAL COURSE:  1. Atrial fibrillation with RVR.  As mentioned above, a patient with      remote history of atrial fibrillation.  Two-D echo obtained in      January of 2009 revealed normal LV function and there was felt to   be no need to repeat 2-D echocardiogram at this time as the patient      was already on anticoagulation therapy with history of DVT and PE      in January of 2009.  As mentioned above the patient converted to a      normal sinus rhythm after IV diltiazem bolus given in the ER.  He      has remained in sinus rhythm overnight.  [name] thyroid      function within normal limits.  Cardiac enzymes were cycled x3 and      within normal limits.  The patient is to follow up with primary      care physician as scheduled to be considered for a cardiology      evaluation at primary care physician's discretion.  [name]      near syncope thought secondary to atrial fibrillation with RVR in      addition to coexisting autonomic dysfunction in setting of      Parkinson's disease and hypotension.  2. Supratherapeutic INR.  [name] INR at  time of discharge 4.0.      The patient instructed to hold Coumadin tonight and will have      repeat INR done at followup visit this Friday.   DISCHARGE MEDICATIONS:  1. Klonopin 1 mg p.o. q.i.d.  2. Trazodone 100 mg p.o. daily at bedtime.  3. Mirapex 0.25 mg p.o. t.i.d.  4. Coumadin 5 mg daily.  5. Luvox tapered dose as directed.  6. Aricept 10 mg p.o. daily.  7. Proscar 5 mg p.o. daily.  8. Risperdal 1 mg p.o. daily at bedtime.  9. Protonix 40 mg p.o. daily.   DISPOSITION:  The patient was felt medically stable for discharge home  at this time where he lives with his wife and granddaughter.  The  patient was instructed to follow up with Dr. [name] on Friday, August 7 at  11 a.m. at which time his INR will need to be rechecked.  Also of note,  the patient's wife is requesting switching to neurologist in [place].  However, attempted to make appointment with neurology during this  hospitalization and they requested patient records from the office.  Therefore, will defer referral to the patient's primary care physician.      Cordelia Pen, NP      Raenette Rover. Felicity Coyer, MD  Electronically Signed    LE/MEDQ  D:  09/27/2007  T:  09/27/2007  Job:  [id number]   cc:   [name], MD

## 2010-07-07 NOTE — Discharge Summary (Signed)
[name], [name]              ACCOUNT NO.:  [id number]   MEDICAL RECORD NO.:  [id number]          PATIENT TYPE:  INP   LOCATION:  1323                         FACILITY:  [facility]   PHYSICIAN:  Rosalyn Gess. Norins, MD  DATE OF BIRTH:  Dec 23, 1940   DATE OF ADMISSION:  10/07/2008  DATE OF DISCHARGE:  10/14/2008                               DISCHARGE SUMMARY   ADMISSION DIAGNOSES:  1. Urinary tract infection.  2. Question pneumonia.  3. Fall history while on Coumadin.  4. Pulmonary hypertension.  5. Parkinson's disease.   DISCHARGE DIAGNOSES:  1. Urinary tract infection.  2. Question pneumonia.  3. Fall history while on Coumadin.  4. Pulmonary hypertension.  5. Parkinson's disease.   CONSULTANTS:  None.   PROCEDURES:  1. Chest x-ray, one view, on the day of admission showed volume loss      of pleural fluid in the right hemithorax, cannot exclude      hemothorax.  Potentially minimally displaced posterior right rib      fracture, cardiomegaly, diffuse airspace disease representing      pneumonia versus pulmonary edema versus pulmonary hemorrhage.  2. Diagnostic x-ray of the left shoulder showed no evidence of      fracture or dislocation.  3. CT of the head on October 08, 2008, showed no acute intracranial      abnormality.  Chronic sinus disease.  Posterior parietal scalp soft      tissue swelling.  4. CT of the abdomen showed no acute abnormality.  Moderate bilateral      pleural effusions were noted.  Probable hemorrhagic cyst and right      interpolar kidney.  5. CT the pelvis showed no acute pelvic abnormality.  Central low-      density enlargement of the inferior abdominal wall laterally likely      in transverse abdominis muscle may represent intramuscular      hemorrhage.  Positive megaly.  6. CT of the chest without contrast on October 08, 2008, showed      moderate right and small left pleural effusions with collapse and      consolidation of the right lower lobe.   Infection cannot be      excluded.  Enlargement of the pulmonary arteries suggests pulmonary      hypertension.  Mild cardiomegaly.  7. He had a 2D echo performed on October 10, 2008, which revealed      normal systolic function with an EF of 55-65% with normal wall      motion.  The patient had concomitant abnormal relaxation increased      pressures consistent with grade 2 diastolic dysfunction.  Mitral      valve was mildly thickened.  Moderate regurgitation was noted.      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   HISTORY OF PRESENT ILLNESS:  This is a 70 year old gentleman with  Parkinson's disease and mild dementia who was on Coumadin for atrial  fibrillation.  One week prior to admission, he suffered a fall.  He  denied loss of consciousness or hitting his head.  He  has had some  abdominal bruising since that time.  Coumadin has been held because of  hematuria.  On the day of admission, he developed a fever and was  brought to the emergency department.  In the emergency department, he  was found to have a UTI and was hypotensive but responded to IV  hydration.  The patient had a question of pneumonia on chest x-ray.  For  these reasons, he was admitted to the hospital.  Please see the H and P  for past medical history, family history, social history and physical  exam at admission.   HOSPITAL COURSE:  1. The patient was admitted to the regular floor.  He was started on      antibiotic therapy for both his UTI as well as for his pneumonia.      The patient was seen and evaluated by the hospice care service.  He      did not meet criteria for hospice care either on the basis of his      Parkinson's disease given his high level of function or on the      basis of pulmonary disease.  On his antibiotic regimen, the      patient's urinary tract symptoms improved.  He did have urine      culture coming back as E. coli that was sensitive to Rocephin.  The      patient has had no  urinary complaints.  2. Pneumonia.  Patient with evidence of pneumonia on chest x-ray and      CT scan.  He was treated with Rocephin as in problem one.  He did      well on this regimen.  He had no significant respiratory distress      or discomfort.  The patient was converted to oral antibiotics.  At      this point, he is stable and tolerating this well.  He can be sent      home to complete his antibiotic therapy.  3. Parkinson's disease.  Patient with history of fall.  He has been      seen by PT and OT.  He does do relatively well at home.  I think he      would benefit from in-home PT therapy.  He will need assistance      with all his activities of daily living.  4. Atrial fibrillation.  The patient remained in atrial fibrillation.      It was rate controlled.  He was treated for acute on chronic      diastolic heart failure also secondary and exacerbated by his      atrial fibrillation.  He did well with diuresis.  Last BNP on      October 11, 2008, was 319.  Of note, the patient did have cardiac      enzymes that were cycled x3 and were negative with no evidence of      cardiac injury.  The patient will be discharged on his home      medications without change.  5. History of PE.  The patient was on Coumadin for history of the PE      as well as for his atrial fibrillation.  At this point, with his      instability, he is not felt to be a safe candidate for Coumadin      therapy.  This was discussed with the patient and his family.  He  will be maintained on aspirin.   With the patient being medically stable and afebrile with normal blood  pressure with him now being on oral antibiotics, he is felt to be stable  for discharge home to continue oral antibiotic therapy.  Will arrange  for PT and OT in the home as well as home health nursing to follow up on  his respiratory status as well as his congestive heart failure.   Although the patient is not an candidate for hospice,  he is cleared by  his code status which is DNR.   DISCHARGE PHYSICAL EXAMINATION:  VITAL SIGNS:  Temperature 98, blood  pressure 107/56, heart rate 74, respirations 16, O2 sats 98% on room  air.  GENERAL APPEARANCE:  This is a large gentleman sitting in a chair in no  acute distress.  HEENT:  Unremarkable with no sign of raccoon's eyes.  No battle sign.  Conjunctivae and sclerae were clear.  NECK:  Supple.  CHEST:  Patient is moving air well and did not appreciate any wheezing.  There is no increased work of breathing.  CARDIOVASCULAR:  2+ radial pulses.  Precordium was quiet.  He had an  irregularly irregular heartbeat that was rate controlled.  ABDOMEN:  Soft.  NEUROLOGIC:  The patient is awake.  He is oriented to person, place and  context.  He recognized me as the doctor he saw in February.  Speech is  clear.  The patient does have a significant resting tremor.  He was not  stood or ambulated.   DISCHARGE MEDICATIONS:  Per the med rec sheet include:  1. Amiodarone 200 mg daily.  2. Baclofen 10 mg daily.  3. Carbidopa/levodopa 25/100 twice daily.  4. Cardizem 30 mg daily.  5. Coumadin has been stopped.  6. Flomax 0.4 mg daily.  7. Digoxin 0.125 mg daily.  8. Hydroxyzine 10 mg every six hours.  9. Furosemide 60 mg 1-1/2 tablets daily.  10.Trazodone 50 mg nightly.  11.Lorazepam 0.5 mg t.i.d.  12.Lotrisone cream twice daily to skin lesions.  13.Potassium 20 mEq b.i.d. or twice daily.  14.Promethazine 12.5 mg q.6 h. as needed.  15.Vistaril 15 mg nightly p.r.n.  16.Roxanol 20 mg/ml, and he may have 0.2 ml q.30 minutes p.r.n.  17.Omeprazole 20 mg 2 tablets daily.  18.Sertraline 50 mg daily.  19.We will add Synthroid 25 mcg daily.  20.Ceftin 500 mg daily.   New prescriptions are provided for the additional medications as noted.  The patient will continue with knee-high compression stockings.  He will  continue with instant food thickener.   [name] condition at the  time of discharge dictation is stable and  improved.      Rosalyn Gess Norins, MD  Electronically Signed     MEN/MEDQ  D:  10/14/2008  T:  10/14/2008  Job:  [id number]   cc:   [facility]  [place]   Dr. [facility]

## 2010-07-07 NOTE — Consult Note (Signed)
NAME:  [name], [name]              ACCOUNT NO.:  [id number]   MEDICAL RECORD NO.:  [id number]          PATIENT TYPE:  INP   LOCATION:                               FACILITY:  [facility]   PHYSICIAN:  Wilson Singer, M.D.DATE OF BIRTH:  [birth year]   DATE OF CONSULTATION:  DATE OF DISCHARGE:                                 CONSULTATION   STATEMENT OF HOSPICE ELIGIBILITY:  This is a 70 year old man who is  hospice eligible with a diagnosis of Parkinson disease.  His palliative  performance score is 40% at best.  He also has comorbid conditions  including moderate Alzheimer dementia.  His albumin is 2.7.  Other  conditions include chronic atrial fibrillation, recent history of  seizure disorder.  Should the disease run its normal course, the patient  is deemed to have a prognosis of 6 months or less.      Wilson Singer, M.D.  Electronically Signed     NCG/MEDQ  D:  04/15/2008  T:  04/15/2008  Job:  [id number]

## 2010-07-07 NOTE — H&P (Signed)
NAMEGEDEON, [name]              ACCOUNT NO.:  [id number]   MEDICAL RECORD NO.:  [id number]          PATIENT TYPE:  INP   LOCATION:                               FACILITY:  [facility]   PHYSICIAN:  Michiel Cowboy, MDDATE OF BIRTH:  05/09/1940   DATE OF ADMISSION:  10/08/2008  DATE OF DISCHARGE:                              HISTORY & PHYSICAL   PRIMARY CARE Dossie Ocanas:  Dr. [name].   CHIEF COMPLAINT:  Bruising and fever.   The patient is a 70 year old male with history of dementia and Parkinson  disease, on Coumadin for atrial fibrillation.  About a week ago he had  suffered a fall while taking out the trash.  He denies loss of  consciousness or hitting his head.  He had been having some abdominal  bruising since.  His Coumadin has been held but he had been having some  bloody urine and today developed a fever, at which point his wife  brought him into the emergency department.  In the ED he was found to  have urinary tract infection and was noted to be hypotensive but  somewhat responded to IV fluids.  Due to a question of pneumonia, his  chest x-ray.  Per his wife, he has not had a recent cough but about a  week ago he was having a lot of coughing and sneezing.  Otherwise no  chest pain, no shortness of breath.   REVIEW OF SYSTEMS:  Otherwise unremarkable.  Patient himself unable to  provide me with a review of systems, but his wife does not endorse any  other issues.   PAST MEDICAL HISTORY:  1. Significant for atrial fibrillation.  2. Dementia.  3. Parkinson disease.  4. Prostatic hypertrophy.  5. Hypertension.  6. History of aspiration pneumonia in the past.  7. Dysphagia.  8. History of PE in the past.   SOCIAL HISTORY:  The patient does not smoke or drink.  Lives at with his  wife, who is very attentive.   FAMILY HISTORY:  Noncontributory.   ALLERGIES:  MOTRIN causes nausea, CODEINE causes nausea as well.  Okay  to use Darvocet.   MEDICATIONS:  1. Prilosec 20 mg  twice a day.  2. Trazodone 50 mg at bedtime.  3. Coumadin 5 mg, currently on hold.  4. Baclofen 10 mg daily.  5. Cardizem 30 mg once a day.  6. Flomax 0.4 mg daily.  7. Hydroxyzine 10 mg every 6 hours.  8. Lorazepam 1 mg every 6 hours.  9. Lasix 40 mg daily.  10.Potassium 20 mEq 2 times a day.  11.Promethazine as needed.  12.Restoril 15 mg at bedtime.  13.Roxanol as needed for pain, dose unclear.  14.Sertraline 50 mg once a day.  15.Amiodarone 200 mg daily.  16.Digoxin 125 mcg daily.  17.Carbidopa/levodopa 25/100 mg 2 times a day.  18.Omeprazole 40 mg once a day.   VITALS:  Temperature 102.6, blood pressure 107/56, down to 96/58, pulse  71, respirations 18, saturating 94% on 2 liters.  The patient appears to be currently in no acute distress.  HEAD:  Nontraumatic.  Dry  mucous membranes.  Decreased skin turgor.  LUNGS:  Occasional crackles, diminished breath sounds bilaterally at  bases.  HEART:  There is a loud systolic murmur noted.  ABDOMEN:  Soft, nontender, nondistended.  SKIN:  Significant for bruising over anterior portion of the abdomen.  LOWER EXTREMITIES:  Without clubbing, cyanosis or edema.  Grossly neurologically intact.   LABORATORY DATA:  White blood cell count 9.3, hemoglobin 9.7.  Sodium  134, potassium 4.1, creatinine 1.35.  UA showing positive for nitrite  and white blood cell count is too numerous to count as well as many red  blood cells.  Chest x-ray showing right hemithorax with pleural fluid,  cannot rule out pneumonia.  CT scan of abdomen and pelvis showing no  hematoma in the abdomen, but CT scan of the pelvis showing inferior  abdominal intramuscular hemorrhage and renal cyst.   Left shoulder negative.  CT scan of the head unremarkable.  CT scan of  the chest showing right lower lobe pneumonia versus collapse versus  atelectasis with a moderate pleural fusion.   ABG:  7.363/40.8/97.3.  EKG showing no P waves and no evidence of  ischemia but  regular rhythm.   ASSESSMENT/PLAN:  This is a 70 year old gentleman with dementia and  Parkinson disease, currently with urinary tract infection, hypertension,  questionable pneumonia, an intramuscular hemorrhage, although mild, but  still therapeutic on his INR.  1. Urinary tract infection with hypotension.  Question early systemic      inflammatory response syndrome.  We will give Levaquin as this      should cover his urinary tract infection as well as his possible      pneumonia.  Await the results of urine culture.  We will do a      swallow evaluation as the patient has history of aspiration      pneumonia in the past.  2. Hypotension, likely secondary to infection although hemmorhage      could not be ruled out.  We will check frequent CBCs, give IV      fluids and follow, admit to step-down.  3. Question intramuscular hemorrhage.  We will give FFP, hold      Coumadin.  4. History of atrial fibrillation.  Continue digoxin and obtain      digoxin level and amiodarone.  5. History of Parkinson's.  Continue carbidopa.  6. Questionable history of pulmonary embolism in the past.  This needs      to be further verified, and the timing of      this needs to be established.  I see it only once mentioned in the      notes.  7. Prophylaxis.  Protonix, and he is still therapeutic on Coumadin but      will do SCDs.  8. Code status.  Do not intubate, do not resuscitate, but okay to give      IV fluids and antibiotics.      Michiel Cowboy, MD  Electronically Signed     AVD/MEDQ  D:  10/08/2008  T:  10/08/2008  Job:  [id number]   cc:   [name], MD  [address] [place]  [place] [address]

## 2010-07-07 NOTE — Assessment & Plan Note (Signed)
OFFICE VISIT   [name], [name]  DOB:  15-Feb-1941                                        January 29, 2010  CHART #:  [id number]   CURRENT PROBLEMS:  1. 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  2. Endovein harvest tunnel infection, treated with debridement and      dressing changes, now resolved.  3. Parkinson disease.  4. History of atrial fibrillation.   HISTORY OF PRESENT ILLNESS:  The patient is very nice 70 year old  gentleman returns for final office visit after undergoing bypass  grafting with combined mitral valve repair this past summer.  He had a  long postoperative recovery due to his fairly significant Parkinson  disease but now has reached fairly good functional state without  symptoms of angina or CHF and the chest and leg incisions are all well  healed.  She has maintained in sinus rhythm and has had no significant  problems since his last visit 2 months ago.  He continues on his  aspirin, metoprolol 12.5 b.i.d. and has stopped the midodrine for the  orthostatic hypotension as that has now significantly improved.   PHYSICAL EXAMINATION:  Blood pressure 105/70 both supine and upright,  pulse 80 and regular, respirations 18, saturation 97%.  His weight is  207 pounds.  He appears heavier but happier.  Breath sounds are clear.  His cardiac exam reveals a soft systolic murmur.  His rhythm is regular.  He has no peripheral edema and incisions are all healed.   LABORATORY DATA:  Chest x-ray shows clear lung fields without pleural  effusion.  Review of his most recent echo shows good LV function with  trivial-to-mild mitral regurgitation and intact annuloplasty ring.   IMPRESSION AND PLAN:  The patient finally has shown good functional  result from his cardiac operation and is ready to  return to the care of  his primary physician Dr. Gershon Crane, and his cardiologist Dr.  Jens Som, and his  neurologist Dr. Vickey Huger.  He will return here as  needed.   Kerin Perna, M.D.  Electronically Signed   PV/MEDQ  D:  01/29/2010  T:  01/30/2010  Job:  [id number]

## 2010-07-07 NOTE — Assessment & Plan Note (Signed)
OFFICE VISIT   CROY, DRUMWRIGHT  DOB:  12-28-1940                                        October 29, 2009  CHART #:  [id number]   CURRENT PROBLEMS:  1. 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  2. Status post coronary artery bypass graft, mitral valve repair July      2011.  3. Parkinson disease.   PRESENT ILLNESS:  The patient is a disabled 70 year old gentleman  returns for wound check of his right thigh.  A wound VAC was placed  after excisional debridement of an abscess.  Culture positive for MRSA  in the endovein tunnel.  A home health nurse is changing this on a  Monday, Wednesday, Friday schedule.  He has finished his oral  antibiotics (doxycycline and Keflex) and the erythema has resolved.  The  wound is clean and healing in nicely.  He continues to do well in sinus  rhythm without angina or symptoms of CHF and his lower extremity edema  has resolved on oral Lasix 40 mg daily.   PHYSICAL EXAMINATION:  He is afebrile.  Temperature 97.2, oxygen  saturation 97%, pulse 84, blood pressure 106/72.  His cardiac rhythm is  regular.  I hear no murmur of mitral regurgitation.  The sternal  incision is well healed.  The right thigh incision is opened and is  granulated and starting to contract and become more shallow, but is  still significant enough to need a wound VAC sponge.  A new sponge was  placed and we will plan on continuing with wound VAC therapy for another  week and assess him in the office in 1 week.   Addendum:  He was given a prescription for Ultram 50 mg p.r.n. pain.   Kerin Perna, M.D.  Electronically Signed   PV/MEDQ  D:  10/29/2009  T:  10/30/2009  Job:  [id number]

## 2010-07-07 NOTE — Discharge Summary (Signed)
[name], [name]              ACCOUNT NO.:  [id number]   MEDICAL RECORD NO.:  [id number]          PATIENT TYPE:  INP   LOCATION:  1405                         FACILITY:  [facility]   PHYSICIAN:  Rosalyn Gess. Norins, MD  DATE OF BIRTH:  07/23/1940   DATE OF ADMISSION:  05/07/2008  DATE OF DISCHARGE:  05/08/2008                               DISCHARGE SUMMARY   ADMITTING DIAGNOSIS:  Coagulopathy.   DISCHARGE DIAGNOSIS:  Coagulopathy.   CONSULTANTS:  Courtney Paris, M.D. for urology.   PROCEDURES:  None.   HISTORY OF PRESENT ILLNESS:  [name] is a 70 year old gentleman with  end-stage Parkinson's disease, atrial fibrillation, history of pulmonary  emboli.  He has been on Coumadin both for his PE history and atrial  fibrillation.  He also has a history of BPH and has an indwelling Foley  catheter.  The patient is a hospice patient because of his end-stage  Parkinson's disease.   The patient was seen by a hospice nurse at home to exchange Foley  catheter.  When she was trying to place the Foley she noticed that she  was not able to place it successfully and that was followed by a  significant amount of bleeding.  Because of ongoing bleeding from the  urethra he presented to the emergency department.  Courtney Paris,  M.D. saw the patient urgently and placed a 22-gauge Foley with success  and he was able to irrigate and remove clots from the bladder after  which his hematuria dramatically improved.  [name] INR at  admission was noted to be 6.  [name] family wanted this to be  addressed and therefore he was admitted for fresh frozen plasma and  monitoring.   Please see the H and P for past medical history, family history and  social history, as well as admission medications.   HOSPITAL COURSE:  The patient was transfused fresh frozen plasma.  While  on this regimen he did very well with no recurrent bleeding from his  urethra.  INR did correct with  readings of 3 and then on the morning of  discharge, 2.7.  It was felt that the patient's coagulopathy was  secondary to the interaction of ciprofloxacin with his Coumadin  metabolism.  At this point, with the patient being stable with no  further bleeding was INR being stable he is ready for discharge.   DISCHARGE EXAM:  Temperature of 97.5, blood pressure 129/73, heart rate  was 80, respirations were 19, O2 SATs 100% on room air.  GENERAL APPEARANCE:  This is a pleasant elderly gentleman sitting in a  Geri-chair who is awake, alert, oriented and conversant.  HEENT EXAM:  Unremarkable.  CHEST:  The patient is moving air well with no wheezing.  CARDIOVASCULAR:  The patient had 2+ peripheral pulses.  Precordium was  quiet.  His heart rate was irregularly irregular.  NEURO:  The patient has a resting tremor.  He has a somewhat flattened  affect secondary to his Parkinson's disease.  No further neurologic  examination was conducted.  However, the patient was seen and  evaluated  by a first year medical student, Theophilus Bones.  This revealed that  cranial nerves II-VII and IX-XII were intact.  Cranial nerve VIII was  diminished in the right ear and left ear line.  Motor strength  evaluation revealed the patient to have cogwheeling, worse in the left  arm than the right.  The patient had a significant low-frequency resting  tremor.  The patient was evaluated and had no dysdiadochokinesia.  The  patient had an abnormal finger-to-nose test.   FINAL LABORATORY:  Pro time on the day of discharge, 2.7.  Metabolic  panel from March 16, at admission with a sodium 140, potassium 4.1,  chloride 107, CO2 of 26, BUN 5, creatinine 1, glucose was 100.  UA at  admission showed too many RBCs to count, no leukocyte esterase or  nitrite.  Therefore, no microscopic exam was performed.  CBC admission  with a hemoglobin of 11.5 grams, white count was 6700.   DISCHARGE MEDICATIONS:  The patient will resume all  of his home  medications including:  1. Pantoprazole 40 mg b.i.d.  2. Aricept 5 mg daily.  3. Mirapex 0.5 mg b.i.d.  4. Lasix 40 mg daily.  5. Phenergan 25 mg suppository per rectum p.r.n. q.6h.  6. Hydroxyzine 10 mg q.i.d.  7. Coumadin 5 mg daily.  8. Ramipril 2.5 mg daily.  9. Zoloft 50 mg daily.  10.Diltiazem 30 mg b.i.d.  11.Lorazepam 0.5 to 1 mg q.6h. p.r.n. agitation.  12.Potassium 20 mEq b.i.d.  13.Darvocet-N 100 q.6h. p.r.n.  14.Trazodone 50 mg nightly.  15.We will stop Cipro.  16.May resume Flomax.   DISPOSITION:  The patient is to be discharged home.  He will continue to  be followed by hospice and [facility].  He will need  to have a pro time checked on Friday, March 19.   [name] condition at time of discharge dictation is stable and  improved.      Rosalyn Gess Norins, MD  Electronically Signed     MEN/MEDQ  D:  05/08/2008  T:  05/08/2008  Job:  [id number]   cc:   [facility]   Courtney Paris, M.D.  Fax: [phone]

## 2010-07-07 NOTE — Consult Note (Signed)
[name], [name]              ACCOUNT NO.:  [id number]   MEDICAL RECORD NO.:  [id number]          PATIENT TYPE:  INP   LOCATION:  1510                         FACILITY:  [facility]   PHYSICIAN:  Wilson Singer, M.D.DATE OF BIRTH:  1941/01/18   DATE OF CONSULTATION:  04/15/2008  DATE OF DISCHARGE:                                 CONSULTATION   REFERRING PHYSICIAN:  Nelda Bucks, MD.   REASON FOR CONSULTATION:  To determine goals of care.   IMPRESSION:  1. Dyspnea - secondary to aspiration pneumonia with underlying severe      Parkinson's disease and moderate dementia.  2. Dysphagia - secondary to Parkinson's and dementia, now on a      modified diet.  3. Palliative performance score 40%.   RECOMMENDATIONS:  1. DNR already established.  2. MOST (Medical orders for scope of treatment) form completed.  3. The patient is hospice eligible with a diagnosis of Parkinson's      disease.  4. Disposition.  The family would desire the patient to go home with      hospice.   HISTORY:  This 70 year old man was admitted over a week ago with  weakness.  The patient while in the hospital apparently had some kind of  episode which may have been seizures and he was intubated for airway  protection.  He was then safely extubated and now we are asked to  address issues relating to goals of care and hospice eligibility.  Prior  to this admission to the hospital, the patient had been looked after by  his wife at home.  The patient was mostly chair bound for the majority  of the day and hardly did any walking.  Walking would involve help by  his wife and he has had several falls when attempts at walking have been  made, at least more recently.  He has not had a problem with swallowing  up to this point in this admission.  The Parkinson's disease was  diagnosed approximately 4 years ago and dementia was diagnosed 3 years  ago.  However, the wife clearly states that he had problems with  memory  as long ago as 5 years ago.   PAST MEDICAL HISTORY:  1. Parkinson's disease.  2. Alzheimer's disease.  3. Atrial fibrillation on anticoagulation.  4. Chronic heart murmur.  5. Gastroesophageal reflux disease.   ALLERGIES:  MOTRIN.   SOCIAL HISTORY:  He has been married for many years and lives with his  wife.  He retired approximately 5 years ago.  His wife takes care of him  at home.  He does not smoke, does not drink alcohol.   FAMILY HISTORY:  Noncontributory.   CURRENT MEDICATIONS:  1. Trazodone 50 mg q.h.s.  2. Hydrocortisone 50 mg IV every 6 hours.  3. Aricept 5 mg daily.  4. Diltiazem 30 mg every 12 hours.  5. Protonix 40 mg daily;  6. Mirapex 0.5 mg daily, I believe.  7. Zosyn 3.375 grams IV every 8 hours.  8. Intravenous fluids with half-normal saline 75 mL an hour.  9. Atrovent and  albuterol nebulizers p.r.n.   REVIEW OF SYSTEMS:  The patient said that he feels depressed and also  short of breath.  There are no other symptoms referable to all systems  reviewed.   PHYSICAL EXAMINATION:  The patient is afebrile and appears to be  hemodynamically stable.  He is saturating 97% on 4 liters of oxygen.  HEART:  Sounds are present.  LUNGS:  Normal lung fields are present and clear except there is some  reduced air entry in the bases.  ABDOMEN:  Soft and nontender.  NEUROLOGICAL:  He has features of Parkinson's disease with cogwheeling  rigidity and an expressionless facies.   RELEVANT DATA:  Chest x-ray indicative of right lower lobe pneumonia.  Creatinine 1.00, BUN 16, hemoglobin 10.3, INR 2.4 on anticoagulation.  His albumin is 2.7, reduced.   DISCUSSION:  This 70 year old man has end-stage severe Parkinson's  disease together with what I would call moderate dementia.  The  combination of both these progressive diseases is that he is hospice  eligible with a diagnosis primarily of Parkinson's disease.  The family  are very clear.  They do not want  aggressive measures and have completed  a MOST form indicating such choices.  The patient is a DNR which has  already been established.   The time spent was 90 minutes more than 50% of which was involved in  counseling and coordination of care and discussion of pathophysiology of  disease process and the concept of hospice and palliative medicine.      Wilson Singer, M.D.  Electronically Signed     NCG/MEDQ  D:  04/15/2008  T:  04/15/2008  Job:  [id number]

## 2010-07-07 NOTE — Discharge Summary (Signed)
Wayne, [name]              ACCOUNT NO.:  [id number]   MEDICAL RECORD NO.:  [id number]          [name] TYPE:  INP   LOCATION:  4708                         FACILITY:  [facility]   PHYSICIAN:  [name], MDDATE OF BIRTH:  1940/07/20   DATE OF ADMISSION:  09/07/2007  DATE OF DISCHARGE:  09/08/2007                               DISCHARGE SUMMARY   DISCHARGE DIAGNOSES:  1. Status post witnessed syncopal event felt likely secondary to      orthostatic hypotension exacerbated by medicines and possibly      autonomic dysfunction of Parkinson disease.  Plan for outpatient      followup at [facility]  2. History of bilateral lower extremity deep venous thrombosis and      bilateral pulmonary embolism in January 2009, on full dose      anticoagulation with Coumadin.  3. Parkinson disease with dementia.  4. History of right brain meningioma.  5. Benign prostatic hypertrophy.  Flomax on hold.  This is felt that      it may be contributing to [name]'s syncope.  [name] will be      continued on Proscar.  6. History of asthma/questionable chronic obstructive pulmonary      disease, stable at baseline.  7. History of allergic rhinitis, stable.   HISTORY OF PRESENT ILLNESS:  [name] is a 70 year old white male  admitted on September 07, 2007, following a syncopal event.  [name] was recently  on [facility] in January 2009, for bilateral lower  extremity DVT and bilateral PE.  [name] presented to [facility] ER  following a witnessed syncopal event this morning.  [name] reported  getting up on Wayne morning of admission, felt suddenly lightheadedness  per [name] and granddaughter who witnessed Wayne episode.  [name] fell on his  right hand and his right knee.  [name] and granddaughter got Wayne  [name] up on Wayne toilet and reported his eyes rolled back.  His  breathing became heavy, and [name] was unresponsive for  approximately 5 minutes.  They called EMS.  There  was no nausea,  vomiting, or diaphoresis noted.  There was also no incontinence or  seizure activity.  They have noticed 3-4 falls over Wayne last 4 weeks.  [name] was admitted for further evaluation and treatment.   COURSE OF HOSPITALIZATION:  1. Syncope.  [name] was admitted and underwent a CT of Wayne head      which was negative for any acute changes.  Serial cardiac enzymes      were performed which were negative x3.  An MRI of Wayne brain was      performed which showed no acute intracranial abnormality.  Mild      bilateral MCA stenosis was noted, otherwise unremarkable MRA.      Small right sphenoclival meningioma was noted which is not new per      history.  Carotid duplex was performed which was negative for ICA      stenosis.  [name]'s Flomax was discontinued as it was felt  that this could be contributing to [name]'s symptoms.   A Neurology consult was also requested, and [name] was seen in  consultation by Dr. Delia [name].  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  [name] recommended continuing his present dose of Mirapex and following up  with his neurologist at [facility].   DISCHARGE MEDICATIONS:  1. Clonazepam 1 mg p.o. q.i.d.  2. Protonix 40 mg p.o. daily.  3. Trazodone 100 mg p.o. nightly.  4. Mirapex 0.25 mg p.o. t.i.d.  5. Aricept 10 mg p.o. daily.  6. Ambien 10 mg p.o. daily at bedtime.  7. Risperdal 1 mg p.o. nightly.  8. Coumadin 5 mg p.o. daily.  [name] is to hold Coumadin tonight      September 08, 2007, and tomorrow September 09, 2007, for INR 3.8 and will be      scheduled for PT/INR check on September 11, 2007.  9. Luvox 50 mg p.o. daily.  10.Finasteride 5 mg p.o. daily.  11.Flomax has been discontinued.   DISPOSITION:  [name] will be discharged to home.   FOLLOWUP:  [name] is to follow up with [name] on September 26, 2007, at  1 p.m. and to follow up with [name], his  neurologist at [facility] and  contact Wayne office for an appointment.   DISCHARGE LABORATORY DATA:  Sodium 141, potassium 4.1, BUN 8, and  creatinine 1.14.  Hemoglobin 12.6 and hematocrit 36.9.   Greater than 30 minutes was spent on discharge planning.      Wayne Craze, NP      [name]. Felicity Coyer, MD  Electronically Signed    MO/MEDQ  D:  09/08/2007  T:  09/09/2007  Job:  [id number]   cc:   Wayne Senior A. [name], MD  [name]

## 2010-07-07 NOTE — Consult Note (Signed)
[name], [name]              ACCOUNT NO.:  [id number]   MEDICAL RECORD NO.:  [id number]          PATIENT TYPE:  INP   LOCATION:  1405                         FACILITY:  [facility]   PHYSICIAN:  Courtney Paris, M.D.DATE OF BIRTH:  30-Oct-1940   DATE OF CONSULTATION:  05/07/2008  DATE OF DISCHARGE:                                 CONSULTATION   REASON FOR CONSULTATION:  Urinary retention from clot.   BRIEF HISTORY:  This 70 year old patient on hospice therapy for  Parkinson's disease was seen in the emergency room for clot urinary  retention.  The visiting home health nurse from hospice tried to change  his catheter today.  It did not drain.  He came to the emergency room  with a distended abdomen.  The catheter was changed to a #18-French from  a #16-French, and ultrasound confirmed that this was, indeed, in the  bladder.  He still did not drain after initial 50-100 mL.  The patient  still with retention.  He is on Coumadin for atrial fibrillation,  history of deep venous thrombosis and pulmonary emboli from August 2009.  His INR level was markedly prolonged at 6.  He has been stable in the  emergency room, but became a little diaphoretic at 1 time possibly from  a vagal response.  Urological consultation was then obtained.  He is  maintained on chronic Foley catheter just for his incontinence.   REVIEW OF SYSTEMS:  Other medical issues include mild dementia,  hypertension, cardiovascular disease.   MEDICATIONS:  Pantoprazole, Aricept, Mirapex, Cipro, promethazine,  furosemide, hydroxyzine, warfarin, ramipril, Zoloft, diltiazem Flomax,  lorazepam, potassium chloride, trazodone, and propoxyphene.   He has no drug allergies.   Does not smoke.  Has no history of drug abuse.  He is married and is  here with his wife.   REVIEW OF SYSTEMS:  A 12-point review of systems, otherwise, negative.   His blood pressure when he came in was 100/80, temperature 97.5, pulse  102,  respirations 20.  He is a pleasant, quiet white-haired white male in some mild distress  lying quietly in the bed.  HEENT:  Clear.  NECK:  Supple.  HEART:  Heart sounds are rapid and consistent with atrial fibrillation.  ABDOMEN:  Distended.  Penis is normal circumcised, adequate meatus.  Foley catheter is in  place, #18-French not draining any urine.  Bilaterally descended  genitalia are noted.  His prostate is moderately enlarged.  EXTREMITIES:  Negative.  No edema.   I removed the #18 Foley catheter and with Xylocaine jelly inserted a #22-  J[place] Foley.  With just a little irrigation I was able to evacuate  probably 800 mL of dark bloody clot from the bladder.  This afforded him  good relief of his distention.  With saline irrigation the irrigant was  quite clear.  The catheter was left to straight drainage.   IMPRESSION:  1. Clot urinary retention.  2. Parkinson's disease.  3. Mild dementia.  4. Prolonged INR.  5. History of a pulmonary emboli and atrial fibrillation.   RECOMMENDATIONS:  Recommend irrigate as necessary.  Would leave catheter  until time to change in 3-4 weeks, and then replace with a #16 or #18-  [place].  He is on Cipro and that may be why the protime is prolonged.  I  would stop that, and also stop his Flomax if he is going to be on  chronic catheter drainage.  Call again if needed.      Courtney Paris, M.D.  Electronically Signed     HMK/MEDQ  D:  05/07/2008  T:  05/07/2008  Job:  [id number]

## 2010-07-09 ENCOUNTER — Encounter: Payer: Self-pay | Admitting: *Deleted
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
## 2010-07-10 NOTE — Op Note (Signed)
[place]. [facility]  Patient:    [name], [name]                     MRN: [id number] Proc. Date: 08/24/99 Adm. Date:  [id number] Attending:  Kandis Mannan CC:         Dr. Zachery Dauer Samuella Cota, M.D.                           Operative Report  PREOPERATIVE DIAGNOSES: 1. Left inguinal hernia. 2. Lipoma of left groin.  POSTOPERATIVE DIAGNOSES: 1. Left inguinal hernia. 2. Lipoma of left groin.  OPERATION/PROCEDURE: 1. Left inguinal herniorrhaphy with mesh. 2. Excision of lipoma of left groin.  ANESTHESIA:  Local anesthesia (1% Xylocaine without epinephrine, 0.25% Marcaine without epinephrine, and sodium bicarbonate) with anesthesia monitoring anesthesiologist and CRNA.  SURGEON:  Maisie Fus B. Price, M.D.  DESCRIPTION OF PROCEDURE:  The patient was taken to the operating room and placed on the table in the supine position.  The left lower quadrant of the abdomen was prepped and draped for a sterile field.  An oblique incision was outlined with a skin marker.  The local anesthetic was used to block the ilioinguinal nerve in the area for the incision.  The local was then used throughout as necessary.  The oblique inguinal incision was made through the skin and subcutaneous tissue with subcutaneous bleeders being cauterized with the Bovie or ligated with 3-0 Vicryl sutures.  The external oblique aponeurosis was identified and divided in the line of its fibers to and through the external ring.  The cord structures and ilioinguinal nerve were isolated with Penrose drain.  The patient was noted to have some weakness of the inguinal floor with a slight bulge but he also had an indirect inguinal hernia sac coming off the anterior medial aspect of the cord structures. This was dissected back to the internal ring. The hernia sac was opened.  There was no evidence of a sliding hernia.  The hernia sac was then twisted and a high ligation  carried out with a suture ligature of 0 chromic catgut.  The excess hernia sac was removed. The weakened inguinal floor was then imbricated with a running suture of 0 chromic catgut.  A piece of 3 inch x 6 inch mesh was then placed over the entire inguinal floor and a slit was made in the mesh to accommodate the cord structures of the internal ring and the mesh was then overlapped superior and lateral to the cord structures.  The mesh was anchored inferiorly with a running suture of 2-0 Novofil.  The first suture was placed in the pubic tubercle with the other sutures being placed in the shelving edge of Pouparts ligament. The mesh was anchored superiorly and medially with interrupted sutures of 0 Novofil.  The mesh extended up onto the rectus muscle.  The mesh was then sutured to itself where it was overlapped superior and lateral to the cord structures.  There was sufficient ring for the cord structures of the internal ring.  The mesh seemed to be lying nicely in the inguinal floor.  The cord structures of the ilioinguinal nerve were returned to their normal anatomical position and the external oblique aponeurosis was reapproximated with interrupted sutures of 3-0 Vicryl.  The tip of the little finger could be inserted along the cord structures at  the external ring.  Scarpas fascia was closed with 3-0 Vicryl and the skin was closed with running subcuticular suture of 5-0 Vicryl.  The patient had a small lipoma of the crease of the left groin laterally and the local anesthetic was placed over this.  A small incision was made and the lipoma dissected free.  This wound was closed with two single simple inverted subcuticular sutures.  Benzoin and half inch Steri-Strips were applied to both wounds.  Dry sterile dressing was applied.  The patient seemed to tolerate the procedure well and was taken to the PACU in satisfactory condition. DD:  08/24/99 TD:  08/24/99 Job: [id number]
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
## 2010-07-10 NOTE — Discharge Summary (Signed)
[place]. [facility]  Patient:    [name], [name]                     MRN: [id number] Adm. Date:  [id number] Disc. Date: [id number] Attending:  Rosanne Sack CC:         Rosanne Sack, M.D.             Desma Maxim, M.D.             Abran Cantor. Clovis Riley, MD             Maretta Bees. Vonita Moss, M.D.                           Discharge Summary  DISCHARGE DIAGNOSES: 1. Acute bacterial prostatitis/epididymitis complicated by septicemia and    acute urinary retention.  Blood cultures negative x 2 for four days.  Urine    culture lost. 2. Acute urinary retention secondary to problem #1 with benign prostatic    hypertrophy, resolved. 3. Normocytic anemia secondary to acute infection.  Discharge hemoglobin 11.8,    mean corpuscular volume (MCV) 92.  Guaiac negative stools. 4. History of anxiety and depression followed by Dr. Claudette Head. 5. Gastroesophageal reflux disease. 6. Benign prostatic hypertrophy.  DISCHARGE MEDICATIONS: 1. Levaquin 500 mg p.o. q.d. x 10 days (14 days total of therapy). 2. Vicodin one to two tablets q.6h. p.r.n. 3. Flomax 0.4 mg p.o. q.d. 4. Prilosec 40 mg p.o. q.d. 5. Klonopin 0.1 mg p.o. b.i.d.  FOLLOWUP:  [name] will be followed by Dr. Vonita Moss, in urology, on Monday, August 10, 1999, at 3:45 p.m.  Dr. Clovis Riley and Dr. Arvilla Market will see [name] in [facility] on Friday, August 07, 1999, at 11:30 a.m. Dr. Arvilla Market is to repeat another CBC within 10-14 days to confirm the resolution of the patients anemia.  There was no evidence of bleeding noticed throughout this hospital stay.  The guaiac stools were negative.  We believed that the patients acute anemia was secondary to acute infection.  If anemia were to persist, further workup is recommended.  Dr. Vonita Moss will follow [name] from the urological standpoint.  CONSULTATIONS:  None.  PROCEDURES:  None.  LABORATORY DATA AND X-RAY FINDINGS:  Sodium 139,  potassium 3.9, chloride 107, CO2 26, BUN 10, creatinine 1.2, glucose 109.  Hemoglobin 11.8, MCV 92, WBC 5.4, platelets 155.  Guaiac negative stools.  Blood cultures negative x 2 for four days.  HISTORY OF PRESENT ILLNESS:  [name] is a very pleasant 70 year old male who presented with a fever of 101.3, hypotension with blood pressure 88/35, associated with intractable left inguinal pain.  Please see admission H&P by Dr. Benedetto Goad for further details regarding the history of present illness, physical exam and laboratory data.  HOSPITAL COURSE:  #1 - ACUTE PROSTATITIS/EPIDIDYMITIS.  This was associated with septicemia and acute urinary retention.  Given the symptoms of septicemia associated with intractable left inguinal and testicle pain, acute epididymitis was the initial workup diagnosis.  The rectal exam also showed a guaiac tender prostatic exam.  The combination of these findings plus gross pyuria in the urinalysis with leukocytosis of 19,000 with a left shift defined the diagnosis described above.  The patient was admitted to the hospital to receive intravenous antibiotic therapy on pain control.  Prior to the initiation of the antibiotic therapy, blood cultures were obtained.  A urine culture was obtained, although this was lost.  On IV Cipro, the patients leukocytosis started to improve steadily.  By the time of discharge, the white cell count was within normal limits at 5.4.  The signs of septicemia also improving by day #2.  The fever was completely resolved by the time of discharge.  The blood cultures remained stable throughout this hospital stay. The final results were not available yet.  The day prior to discharge, the intravenous antibiotic therapy was switched to tablets without any complications.  The pain at the time of discharge was fairly controlled with Vicodin every six hours.  A Foley catheter was required on day #2 due to acute urinary retention.  Flomax was  started.  We spoke with Dr. Wilson Singer, on call for Dr. Vonita Moss, who agreed with the current therapy.  A formal consult was now obtained due to the steady improvement of the patients clinical symptoms. The day prior to discharge, the Foley was discontinued without further evidence of urinary retention.  The patient was afebrile and hemodynamically stable at the time of discharge.  #2 - NORMOCYTIC ANEMIA:  On admission, the patient had a normal hemoglobin at 15.0.  On day #3, the patients hemoglobin was 11.2 and 11.8 on the day of discharge.  This anemia was associated with transient thrombocytopenia.  The stool was negative for blood.  We believed that this acute anemia associated with transient thrombocytopenia was due to the acute infectious process. There was no evidence of bleeding noted.  The patient will remain on Prilosec for GI protection with GERD.  We recommend to follow the patients hemoglobin within 10-14 days.  No anemia studies were obtained due to the setting of acute infection.  If the patients anemia were to remain and change, further workup with anemia studies and/or GI evaluation may be required.  CONDITION ON DISCHARGE:  Improved. DD:  08/04/99 TD:  08/06/99 Job: [id number]

## 2010-07-10 NOTE — H&P (Signed)
[facility]  Patient:    [name], [name]                       MRN: [id number] Adm. Date:  03/17/00 Attending:  Francis Dowse A. Claudette Head, M.D. Dictator:   Johnella Moloney, N.P.                   Psychiatric Admission Assessment  IDENTIFYING INFORMATION:  [name] is a 70 year old, white, married male admitted on a voluntary basis on March 17, 2000.  CHIEF COMPLAINT:  "Trying to make a decision regarding my marriage, stress, and Ive been depressed."  He states that he took three or four Restoril as night, not as a suicide attempt, but to help him sleep and relax.  HISTORY OF PRESENT ILLNESS:  The patient reports that last night he listened to a message that his wife left on his answering machine, saying that she loved him.  He acknowledges that he had seen an attorney two or three weeks ago regarding options for separation.  He also reports that he did tell his wife of the planned separation this past Monday.  He also acknowledges that he has been very stressed on the job yesterday for some unknown reason.  He states that he really feels better working.  He took two Restoril to get to sleep and to help alleviate the stress.  He states that the "stress would let me to go sleep."  He went back to the cupboard and got one more Restoril and then he went back to bed and got very emotional.  He was crying and upset.  He states that he drove himself to [employer].  He denies this was a suicide attempt.  He states that he just wanted to alleviate some stress and sleep.  He states that he has slept poor for years.  He states that he has a guilt complex.  He states that he used to be a member of the church and sometimes he just feels simply guilty.  His appetite has been good.  He has had no weight loss, in fact he has had some weight gain.  He is more depressed when he is not busy.  He denies any previous suicide attempts, however, there is some  question that there was a suicide attempt when he was admitted to [facility] some time ago.  He does acknowledge that he is depressed and stressed out due to his marital difficulties, having to declare bankruptcy in September of 2001 and the fact that he is ambivalent about making some decisions.  He also states that he is tired.  He works seven days a week.  His wife was recently laid off and he works two jobs.  PAST PSYCHIATRIC HISTORY:  The patient has been going to [facility].  He sees Francis Dowse A. Claudette Head, M.D.  He has come to that office for two to three years.  He has seen other psychiatrists in the past.  He has also had therapy at [facility].  He apparently was hospitalized in [facility] for two weeks several years ago for what he described as stress and depression related to his daughter.  He also was hospitalized at [facility] years ago.  PAST MEDICAL HISTORY:  The patient states that his medical doctor is Dr. Clovis Riley and Desma Maxim, M.D., here in [place], [place]. He last saw them last week.  Medical problems include gastroesophageal reflux disease, hiatal hernia, and benign prostatic hypertrophy, however, his PSA is okay.  He had a urinary tract infection in June of 2001.  He ultimately became dehydrated and he was admitted for two to three days at [facility].  He recently has been diagnosed with iron deficiency anemia.  He does have seasonal allergies.  He does not know of any medications that have not worked for him in the past.  CURRENT MEDICATIONS: 1. Klonopin 1 mg a.m. and h.s. p.o. 2. Prilosec 20 mg q.a.m. 3. Zyrtec 10 mg h.s. p.r.n. for seasonal allergies. 4. Flomax 0.4 mg 30 minutes after meals three time a day p.o. 5. Restoril 15 mg one to two tablets h.s. p.r.n. for sleep. 6. Luvox 100 mg one half tablet in the morning and one and a half  tablets at    h.s. 7. He states that he takes an iron supplement with breakfast and supper.  He    states that it is over-the-counter. 8. He also takes clobetasol ointment a.m. and h.s. for eczema. 9. Vistaril 10 mg p.r.n. for itching h.s. p.r.n. due to his eczema.  DRUG ALLERGIES:  No known drug allergies.  He has taken Motrin in the past and he has adverse effects results in GI distress.  SOCIAL HISTORY:  The patient has been married one time for 30+ years.  He has one son and one daughter who are grown.  He lives with his wife in [place], [place].  He completed the 10th grade.  His parents are deceased.  He has four brothers and five sisters.  He has had marital problems for 20+ years.  Also, he has severe financial problems.  He had to declare bankruptcy in September of 2001.  His wife was employed by [employer] and now she is unemployed and looking for a job.  He is currently working two jobs, one at [employer] and one at the [employer], which causes a lot of stress for him.  He states that he has had long-term problems with his daughter, who is in her 30s.  FAMILY HISTORY:  He has two sisters who have problems with depression and stress.  ALCOHOL/DRUG HISTORY:  The patient states that he drinks wine occasionally, maybe four times a day.  No substance abuse.  He is a nonsmoker.  No tobacco products.  POSITIVE PHYSICAL FINDINGS:  Please see the physical exam done at the [facility] ED on March 16, 2000.  Temperature 97.0 degrees, pulse 75, blood pressure 113/85, respirations 20.  Height 6 feet 3 inches. Weight 202 pounds.  CURRENT MENTAL STATUS EXAMINATION:  A tall, older, white male, dressed casually.  He has good eye contact.  He is cooperative and pleasant.  Speech is normal, articulate, and relevant.  Mood is anxious and sad.  Affect is depressed.  He denies suicidal ideation and intent.  He denies homicidal ideation  and intent.  He states that he did not try to overdose last night and it was not a suicide attempt.  Thought processes are logical and coherent  without evidence of psychosis.  No hallucinations.  No delusions.  No paranoia.  Cognitive:  Alert and oriented.  Cognitive function is intact. Insight is poor.  Judgment fair.  Impulse control fair.  CURRENT DIAGNOSES: Axis I.    Major depression, recurrent, severe. Axis II.   Deferred. Axis III.  1. Benign prostatic hypertrophy.  2. Gastroesophageal reflux disease.            3. Hiatal hernia.            4. Iron deficiency anemia.            5. Seasonal allergies.            6. Eczema. Axis IV.   Psychosocial stressors:  Severe related to problems with primary            support group, occupation problems, and economic problems. Axis V.    Current global assessment of functioning of 49.  Highest in the            past year of 70.  TREATMENT PLAN AND RECOMMENDATIONS:  Voluntary admission to the [employer].  Check every 15 minutes to maintain safety.  The patient denies that he is suicidal.  He reports that he took three Restoril to sleep and relax, not as a suicide attempt.  We will hold his Restoril for now and started him on Klonopin 1 mg a.m. and h.s. p.o., Prilosec 20 mg p.o. q.a.m., Luvox 100 mg one half tablet in the morning and then one and a half tablets h.s. p.o., Vistaril 10 mg p.o. p.r.n. for itching, clobetasol ointment a.m. and h.s. (he may use his own supply) for his eczema, Flomax 0.4 mg 30 minutes after each meal p.o. t.i.d., and Zyrtec 10 mg h.s. p.o. p.r.n. for allergies.  Tentative length of stay and discharge plan three days. DD:  03/17/00 TD:  03/17/00 Job: [id number]
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
## 2010-07-10 NOTE — Discharge Summary (Signed)
NAMELAMIN, CHANDLEY NO.:  [id number]   MEDICAL RECORD NO.:  [id number]          PATIENT TYPE:  IPS   LOCATION:  0506                          FACILITY:  [facility]   PHYSICIAN:  Jasmine Pang, M.D. DATE OF BIRTH:  1940/12/17   DATE OF ADMISSION:  11/17/2005  DATE OF DISCHARGE:  11/22/2005                                 DISCHARGE SUMMARY   IDENTIFYING INFORMATION:  The patient this 70 year old married Caucasian  male who was admitted on a voluntary basis.   HISTORY OF PRESENT ILLNESS:  The patient was taken to the ED on the night  prior to admission after he expressed thoughts of wanting to kill himself  and his brother. He states that his brother has been problematic ever since  childhood, and he became angry because the brother had called the patient's  wife and was mean to her on the phone. The patient has a history of memory  loss.  He is in the process of being worked up for dementia. He is scheduled  for neuropsych testing, but this had to be delayed due to the  hospitalization. The patient has had 1 month of increased frustration, poor  sleep, and episodic agitation.  He has been slamming doors and throwing  things at home.  The patient is currently followed by Dr. Rich Brave,  his psychiatrist. This is the second [facility] admission for  him. He has a history of multiple drug trials.  He has had ECT x2 sessions.  His last hospitalization was February 2007 in [place].  Last [facility] admission was January 2002 with Dr. Jodi Marble. The medications  include Klonopin 1 mg p.o. 4 times a day, Ambien 10 mg p.o. nightly and  trazodone 50 mg p.o. nightly.   For further admission information, see psychiatric admission assessment.   PHYSICAL EXAMINATION:  This was done in the ED.  There were no acute  physical problems noted.   ADMISSION LABORATORIES:  These were done in the ED and reviewed by the ED  doctor.   HOSPITAL COURSE:   Upon admission, the patient was continued on his home  medications of Protonix 40 mg p.o. b.i.d., Avodart 0.5 mg p.o. daily, Flomax  0.4 mg p.o. daily, Toprol XL 50 mg p.o. daily, Klonopin 1 mg p.o. 4 times a  day, doxycycline 100 mg p.o. b.i.d., Ambien 10 mg p.o. nightly, trazodone 50  mg p.o. nightly. On November 19, 2005, the patient was started on Luvox 50  mg daily and Risperdal 0.025 mg nightly and q.6 h p.r.n. agitation.  The  patient tolerated his medications well with no significant side effects.  On  November 19, 2005, the patient states he had slept well last night. He had  a family session with his wife who stated he becomes agitated regularly, and  she fears leaving him alone that he may not be safe.  On November 20, 2005,  the patient stated he slept better.  He talked about his psychiatrist, Dr.  Gaynell Face, who diagnosed him as bipolar disorder.  He was admitted secondary  to thinking  of hurting his older brother and then himself.  [name]  brother has had mental problems since he was a teenager.  The negative  thoughts were gone, he stated, and he felt he could stay away from his  brother.  He is waiting for neuropsychiatric testing for dementia.  On  November 21, 2005, the patient's mental status had improved.  He was less  depressed and angry.  He stated he was no longer angry with his brother and  stated, I love him. He talked further about how difficult his brother's  behavior has been even since he was a child.  He stated he was wanting to  continue his neuropsych testing. It was planned that he would be discharged  tomorrow.   On November 22, 2005, the patient was ready for discharge.  His mental status  had improved markedly.  He was friendly, cooperative, with good eye contact.  Speech normal rate and flow.  Psychomotor activity within normal limits.  Mood less depressed, less angry and irritable. Affect wide range, smiling.  No suicidal or homicidal  ideation.  No ideation for self injurious behavior.  No auditory or visual hallucinations.  No paranoia or delusions.  Thoughts  were logical and goal directed.  Thought content: No predominant theme other  than wanting to get his neuropsychological testing done.  Cognitive was back  to baseline and will be tested further during the neuropsych testing for  memory loss.   DISCHARGE DIAGNOSES:  AXIS I: Major depression recurrent, severe without  psychosis.  AXIS II: None.  AXIS III:  1. Gastroesophageal reflux disease.  2. Mitral valve prolapse.  3. Thrombocytopenia.  4. Meningioma.  5. Benign prostatic hypertrophy.  AXIS IV: Moderate to severe (chronic family conflict with brother.  Medical  problems, questionable dementia).  AXIS V: GAF current is 50.  GAF upon admission 30.  GAF highest past year is  60.   DISCHARGE PLANS:  There were no specific activity level or dietary  restrictions.   DISCHARGE MEDICATIONS:  1. Protonix 40 mg p.o. b.i.d.  2. __________ 0.5 mg daily.  3. Flomax 0.4 mg daily.  4. Metoprolol 50 mg daily.  5. Clonazepam 1 mg 4 times daily.  6. Doxycycline 100 mg twice daily.  7. Trazodone 50 mg at bedtime.  8. Ambien 10 mg at bedtime.  9. Fluvoxamine 50 mg daily.  10.Risperdal 0.25 mg at bedtime.   POST HOSPITAL CARE PLANS:  The patient will see Deboraha Sprang, MD, his  psychiatrist, on Thursday, November 25, 2005, at 4:00 p.m.      Jasmine Pang, M.D.  Electronically Signed     BHS/MEDQ  D:  11/22/2005  T:  11/22/2005  Job:  [id number]

## 2010-07-10 NOTE — Discharge Summary (Signed)
[facility]  Patient:    [name], [name]                     MRN: [id number] Adm. Date:  [id number] Disc. Date: [id number] Attending:  Sandi Raveling Dictator:   Johnella Moloney, NP                           Discharge Summary  HISTORY OF PRESENT ILLNESS:  Mr. Saiki is a 70 year old Caucasian married male admitted on a voluntary basis March 17, 2000 with a chief complaint "trying to make a decision regarding my marriage stress, and Ive been depressed."  He states he took 3 or 4 Restoril last night, not as a suicide attempt but to help him sleep and relax.  Patient reports last night he listened to a message his wife left on his answering machine saying that she loved him.  He acknowledges that he had been seeing an attorney 2 or 3 weeks ago requesting options for separation.  He also reports he did tell his wife of the planned separation this past Monday.  He acknowledges that he has been very stressed on the job.  Yesterday for some unknown reason, states he really feels better working.  Took 2 Restoril to get to sleep and to help alleviate the stress.  He states stress would not let him go to sleep.  He states he went back to the cupboard and got one more Restoril and states that he drove himself to [facility].  Denies this was a suicide attempt, said he just wanted to alleviate the guilt that he had.  Appetite good, no weight loss, no weight gain.  More depressed when he is not busy.  No previous suicide attempts.  There is some question there was a suicide attempt when he was admitted to [facility] some time ago.  Acknowledges that he is depressed, stressed out due to marital difficulties, having to declare bankruptcy in December 2001, and the fact he is ambivalent about making some decisions.  States he is tired.  Works 7 days a week.  His wife was recently laid off and he works 2 jobs.  PAST PSYCHIATRIC HISTORY:   Patient goes to [facility] and sees Carlye Grippe, M.D.  He has been seen there for about 3 years.  He has had one hospitalization at [facility] 2 weeks ago for stress and depression related to his daughter.  He has also been hospitalized at [facility].  PAST MEDICAL HISTORY:  Patients primary care physician is Dr. Clovis Riley and Dr. Arvilla Market.  Medical problems:  Gastroesophageal reflux disease, hiatal hernia, benign prostatic hypertrophy, urinary tract infection June 2001, iron deficiency anemia, seasonal allergies.  ADMISSION MEDICATIONS: 1. Klonopin 1 mg a.m. and h.s. p.o. 2. Prilosec 20 mg a.m. 3. Zyrtec 10 mg h.s. p.r.n. for seasonal allergies. 4. Flomax 0.4 mg 30 minutes after meals t.i.d. 5. Restoril 15 mg 1-2 tablets h.s. p.r.n. for sleep. 6. Luvox 100 mg 1/2 tab in the morning and 1-1/2 tab at bedtime. 7. Takes an iron supplement with breakfast and supper. 8. Vistaril 10 mg p.r.n. for itching of eczema.  DRUG ALLERGIES:  No known drug allergies.  PHYSICAL FINDINGS:  Please see physical examination done at [facility] on March 16, 2000.  No positive findings.  Hemogram was within normal limits.  Routine chemistry within normal limits.  T3 uptake, TSH within normal limits.  Free T4 low at 0.86.  MENTAL STATUS EXAMINATION:  On admission, tall, older Caucasian male, dressed casually, good eye contact, cooperative, pleasant.  Speech normal, articulate and relevant.  Mood anxious, sad.  Affect depressed.  He denies suicidal ideation or intent, denies homicidal ideation or intent, states that he did not try to overdose last night and it was not a suicide attempt.  Thought processes were logical and coherent without evidence of psychosis.  No hallucinations, no delusions, no paranoia.  Cognitively, alert and oriented, cognitive function is intact.  Insight poor, judgment fair, impulse  control fair.  ADMISSION DIAGNOSIS: Axis I:     Major depression, recurrent, severe. Axis II:    Deferred. Axis III:   Benign prostatic hypertrophy, gastroesophageal reflux disease,             hiatal hernia, iron-deficiency anemia, seasonal allergies, eczema. Axis IV:    Psychosocial stressors severe, related to problems with             primary support group, occupational problems, economic problems. Axis V:     Current global assessment of function 49, highest past year 70.  HOSPITAL COURSE:  The patient was admitted to [facility] unit for treatment of his depression.  He was given Klonopin 1 mg p.o. a.m. and h.s., Luvox 100 mg 1/2 tab and 1-1/2 at bedtime, Prilosec 20 mg p.o. q.a.m., Vistaril 10 mg at h.s. p.r.n., and Flomax 0.5 mg 30 minutes after each meal, and Zytec 10 mg h.s. p.o. p.r.n. allergies.  Flomax 0.4 mg p.o. h.s. Patient was in the hospital and on January 25 he continued to deny any suicidal thoughts, discussed his plans to proceed with separation, slept good last night, appetite good, affect blunted but within normal range.  Patient does not appear to be a danger to himself or others.  He certainly is not psychotic, not suicidal, not homicidal.  CONDITION ON DISCHARGE:  Patient is discharged in improved condition, with improvement in his mood, sleep, appetite, no suicidal ideation or intent, no homicidal ideation or intent, and improvement in his energy.  It is felt like he can be maintained on an outpatient basis.  DISPOSITION:  The patient is discharged to home.  FOLLOW UP:  The patient is to follow up with Lynann Bologna, Nurse Practitioner, February 1.  DISCHARGE MEDICATIONS: 1. Klonopin 1 mg 1 tab b.i.d. 2. Luvox 100 mg 1/2 tab a.m. and 1-1/2 at bedtime. 3. Restoril 15 mg 1-2 tabs at h.s. 4. Flomax 0.4 mg 1 tab daily. 5. Zytec 1 tablet daily.  DISCHARGE DIAGNOSES: Axis I:    Major depression, recurrent, severe. Axis II:    Deferred. Axis III:  Benign prostatic hypertrophy, gastroesophageal reflux disease,            hiatal hernia, iron-deficiency anemia, seasonal allergies, eczema.  Axis IV:   Psychosocial stressors are moderate, related to separation from            wife and economic and occupational problems. Axis V:    Current global assessment of function 62, highest in past year 70. DD:  04/21/00 TD:  04/22/00 Job: [id number]
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
## 2010-07-16 ENCOUNTER — Telehealth: Payer: Self-pay | Admitting: *Deleted

## 2010-07-16 NOTE — Telephone Encounter (Signed)
Would like to speak to Dr.Fry's nurse about a lift chair.

## 2010-07-17 NOTE — Telephone Encounter (Signed)
I will write a rx to get this process started, and they need to pick it up and take it to a medical equipment supplier. Then they will send me forms to fill out

## 2010-07-17 NOTE — Telephone Encounter (Signed)
Pt is having a hard time getting up and down and they would like a lift chair cause his wife can't help him.  His back bothers all the time.  They would like to know how they would go about getting this.  Appetite is worse and he has gait, weak.  He pushes himself to get on the exercise bike.  Questions trazodone 150 mg, Lorazepam 0.5mg  4 tabs bid, temazepam 15 mg and he still has a hard time going to sleep.  Wife is the only caregiver and she is having hard time taking care of him.  He is weak all the time.  Sees Dr Vickey Huger on the 20th.

## 2010-07-17 NOTE — Telephone Encounter (Signed)
Pt aware.

## 2010-07-18 ENCOUNTER — Emergency Department (HOSPITAL_COMMUNITY)
Admission: EM | Admit: 2010-07-18 | Discharge: 2010-07-18 | Disposition: A | Discharge status: Home / Self Care | Payer: Medicare Other | Attending: Emergency Medicine | Admitting: Emergency Medicine

## 2010-07-18 DIAGNOSIS — F329 Major depressive disorder, single episode, unspecified: Secondary | ICD-10-CM | POA: Insufficient documentation

## 2010-07-18 DIAGNOSIS — I4891 Unspecified atrial fibrillation: Secondary | ICD-10-CM | POA: Insufficient documentation

## 2010-07-18 DIAGNOSIS — F3289 Other specified depressive episodes: Secondary | ICD-10-CM | POA: Insufficient documentation

## 2010-07-18 DIAGNOSIS — E039 Hypothyroidism, unspecified: Secondary | ICD-10-CM | POA: Insufficient documentation

## 2010-07-18 DIAGNOSIS — F0280 Dementia in other diseases classified elsewhere without behavioral disturbance: Secondary | ICD-10-CM | POA: Insufficient documentation

## 2010-07-18 DIAGNOSIS — Z951 Presence of aortocoronary bypass graft: Secondary | ICD-10-CM | POA: Insufficient documentation

## 2010-07-18 DIAGNOSIS — R350 Frequency of micturition: Secondary | ICD-10-CM | POA: Insufficient documentation

## 2010-07-18 DIAGNOSIS — N400 Enlarged prostate without lower urinary tract symptoms: Secondary | ICD-10-CM | POA: Insufficient documentation

## 2010-07-18 DIAGNOSIS — Z954 Presence of other heart-valve replacement: Secondary | ICD-10-CM | POA: Insufficient documentation

## 2010-07-18 LAB — DIFFERENTIAL
Eosinophils Relative: 7 % — ABNORMAL HIGH (ref 0–5)
Lymphocytes Relative: 25 % (ref 12–46)
Lymphs Abs: 1.5 10*3/uL (ref 0.7–4.0)
Monocytes Absolute: 0.6 10*3/uL (ref 0.1–1.0)

## 2010-07-18 LAB — CBC
HCT: 33 % — ABNORMAL LOW (ref 39.0–52.0)
Hemoglobin: 10.4 g/dL — ABNORMAL LOW (ref 13.0–17.0)
MCV: 81.1 fL (ref 78.0–100.0)
RDW: 15.8 % — ABNORMAL HIGH (ref 11.5–15.5)
WBC: 6.1 10*3/uL (ref 4.0–10.5)

## 2010-07-18 LAB — URINALYSIS, ROUTINE W REFLEX MICROSCOPIC
Bilirubin Urine: NEGATIVE
Glucose, UA: NEGATIVE mg/dL
Ketones, ur: NEGATIVE mg/dL
pH: 6.5 (ref 5.0–8.0)

## 2010-07-18 LAB — BASIC METABOLIC PANEL
GFR calc Af Amer: >60 mL/min (ref >60)
GFR calc non Af Amer: 58 mL/min — ABNORMAL LOW (ref >60)
Potassium: 4.1 mEq/L (ref 3.5–5.1)
Sodium: 139 mEq/L (ref 135–145)

## 2010-07-22 ENCOUNTER — Encounter: Payer: Self-pay | Admitting: Family Medicine

## 2010-07-22 ENCOUNTER — Ambulatory Visit (INDEPENDENT_AMBULATORY_CARE_PROVIDER_SITE_OTHER): Payer: Medicare Other | Admitting: Family Medicine

## 2010-07-22 VITALS — BP 110/68 | HR 76 | Temp 97.4°F | Resp 14 | Wt 235.0 lb

## 2010-07-22 DIAGNOSIS — R531 Weakness: Secondary | ICD-10-CM

## 2010-07-22 DIAGNOSIS — G20A1 Parkinson's disease without dyskinesia, without mention of fluctuations: Secondary | ICD-10-CM

## 2010-07-22 DIAGNOSIS — R5381 Other malaise: Secondary | ICD-10-CM

## 2010-07-22 DIAGNOSIS — G20 Parkinson's disease: Secondary | ICD-10-CM

## 2010-07-22 MED ORDER — TRAZODONE HCL 150 MG PO TABS: 150.0000 mg | ORAL_TABLET | Freq: Every day | ORAL | Status: DC

## 2010-07-22 NOTE — Progress Notes (Signed)
  Subjective:    Patient ID: [name], male    DOB: 05/19/1940, 70 y.o.   MRN: [id number]  HPI Here with his wife to ask about lethargy and fatigue. He wants to use his stationery bicycle but feels too tired to do it. He recently saw Dr. Jens Som, who said he is doing very well from a cardiologic persepctive. Recent labs were all normal. He sees Dr. Vickey Huger in 2 weeks. No other specific issues today    Review of Systems  Constitutional: Positive for fatigue. Negative for unexpected weight change.  Respiratory: Negative.   Cardiovascular: Negative.   Gastrointestinal: Negative.        Objective:   Physical Exam  Constitutional: He is oriented to person, place, and time. He appears well-developed and well-nourished.  Cardiovascular: Normal rate, regular rhythm, normal heart sounds and intact distal pulses.   Pulmonary/Chest: Effort normal and breath sounds normal.  Neurological: He is alert and oriented to person, place, and time.       Movements are slow, using his walker           Assessment & Plan:  After reconciling his medications, we discovered that his wife has been giving him a total of 8 mg of Lorazepam a day instead of 4 mg. She understands now what his regimen should be, and will adjust accordingly. They will give me a report in 2 weeks

## 2010-09-21 ENCOUNTER — Emergency Department (HOSPITAL_COMMUNITY)
Admission: EM | Admit: 2010-09-21 | Discharge: 2010-09-21 | Disposition: A | Discharge status: Home / Self Care | Payer: Medicare Other | Attending: Emergency Medicine | Admitting: Emergency Medicine

## 2010-09-21 ENCOUNTER — Emergency Department (HOSPITAL_COMMUNITY): Payer: Medicare Other

## 2010-09-21 DIAGNOSIS — R209 Unspecified disturbances of skin sensation: Secondary | ICD-10-CM | POA: Insufficient documentation

## 2010-09-21 DIAGNOSIS — F068 Other specified mental disorders due to known physiological condition: Secondary | ICD-10-CM | POA: Insufficient documentation

## 2010-09-21 DIAGNOSIS — G20 Parkinson's disease: Secondary | ICD-10-CM | POA: Insufficient documentation

## 2010-09-21 DIAGNOSIS — R4182 Altered mental status, unspecified: Secondary | ICD-10-CM | POA: Insufficient documentation

## 2010-09-21 DIAGNOSIS — F329 Major depressive disorder, single episode, unspecified: Secondary | ICD-10-CM | POA: Insufficient documentation

## 2010-09-21 DIAGNOSIS — I4891 Unspecified atrial fibrillation: Secondary | ICD-10-CM | POA: Insufficient documentation

## 2010-09-21 DIAGNOSIS — G20A1 Parkinson's disease without dyskinesia, without mention of fluctuations: Secondary | ICD-10-CM | POA: Insufficient documentation

## 2010-09-21 DIAGNOSIS — Z79899 Other long term (current) drug therapy: Secondary | ICD-10-CM | POA: Insufficient documentation

## 2010-09-21 DIAGNOSIS — F3289 Other specified depressive episodes: Secondary | ICD-10-CM | POA: Insufficient documentation

## 2010-09-21 DIAGNOSIS — E039 Hypothyroidism, unspecified: Secondary | ICD-10-CM | POA: Insufficient documentation

## 2010-09-21 DIAGNOSIS — M549 Dorsalgia, unspecified: Secondary | ICD-10-CM | POA: Insufficient documentation

## 2010-09-21 DIAGNOSIS — R262 Difficulty in walking, not elsewhere classified: Secondary | ICD-10-CM | POA: Insufficient documentation

## 2010-09-22 ENCOUNTER — Telehealth: Payer: Self-pay | Admitting: Cardiology

## 2010-09-22 ENCOUNTER — Emergency Department (HOSPITAL_COMMUNITY)
Admission: EM | Admit: 2010-09-22 | Discharge: 2010-09-22 | Disposition: A | Discharge status: Home / Self Care | Payer: Medicare Other | Attending: Emergency Medicine | Admitting: Emergency Medicine

## 2010-09-22 ENCOUNTER — Emergency Department (HOSPITAL_COMMUNITY): Payer: Medicare Other

## 2010-09-22 DIAGNOSIS — Z79899 Other long term (current) drug therapy: Secondary | ICD-10-CM | POA: Insufficient documentation

## 2010-09-22 DIAGNOSIS — Z951 Presence of aortocoronary bypass graft: Secondary | ICD-10-CM | POA: Insufficient documentation

## 2010-09-22 DIAGNOSIS — Z7982 Long term (current) use of aspirin: Secondary | ICD-10-CM | POA: Insufficient documentation

## 2010-09-22 DIAGNOSIS — R5383 Other fatigue: Secondary | ICD-10-CM | POA: Insufficient documentation

## 2010-09-22 DIAGNOSIS — E039 Hypothyroidism, unspecified: Secondary | ICD-10-CM | POA: Insufficient documentation

## 2010-09-22 DIAGNOSIS — F329 Major depressive disorder, single episode, unspecified: Secondary | ICD-10-CM | POA: Insufficient documentation

## 2010-09-22 DIAGNOSIS — F3289 Other specified depressive episodes: Secondary | ICD-10-CM | POA: Insufficient documentation

## 2010-09-22 DIAGNOSIS — R209 Unspecified disturbances of skin sensation: Secondary | ICD-10-CM | POA: Insufficient documentation

## 2010-09-22 DIAGNOSIS — J90 Pleural effusion, not elsewhere classified: Secondary | ICD-10-CM | POA: Insufficient documentation

## 2010-09-22 DIAGNOSIS — F0280 Dementia in other diseases classified elsewhere without behavioral disturbance: Secondary | ICD-10-CM | POA: Insufficient documentation

## 2010-09-22 DIAGNOSIS — I4891 Unspecified atrial fibrillation: Secondary | ICD-10-CM | POA: Insufficient documentation

## 2010-09-22 DIAGNOSIS — R5381 Other malaise: Secondary | ICD-10-CM | POA: Insufficient documentation

## 2010-09-22 LAB — URINALYSIS, ROUTINE W REFLEX MICROSCOPIC
Glucose, UA: NEGATIVE mg/dL
Hgb urine dipstick: NEGATIVE
Leukocytes, UA: NEGATIVE
Specific Gravity, Urine: 1.018 (ref 1.005–1.030)
Urobilinogen, UA: 0.2 mg/dL (ref 0.0–1.0)

## 2010-09-22 NOTE — Telephone Encounter (Signed)
Per pt wife call, wife is taking pt to [facility] ED. Pt condition has not improved and pt wife is concerned. Pt wife wanted to inform MD and nurse that pt is going to ED.

## 2010-09-22 NOTE — Telephone Encounter (Signed)
Per pt wife calling, pt just now leaving ED, pt had test x-ray done today and it was determined that pt had fluid under pt lungs, not in his lungs. Pt wife wanted Dr. Jens Som to explain to wife why fluid would not be in the lung, pt wife has never heard of fluid being under the lung. Pt would prefer for someone to call her after 5:30p at home at [phone]. Please return pt wife call to advise/discuss.

## 2010-09-22 NOTE — Telephone Encounter (Signed)
Per pt wife call, pt has been experiencing headaches on the right side of pt head and then chest gets tight and traveling down his body pt is experiencing numbness down right arm, leg, groin area, all the way down to his feet. Pt has been experiencing this about 2x same time of day. Pt went to ED last regarding same issue pt had increased HR. Pt wife doesn't know if this is an issue w/ pt heart or what. Pt does have parkinson. Please return pt wife call to advise/discuss.

## 2010-09-23 LAB — URINE CULTURE: Colony Count: 30000

## 2010-09-23 LAB — POCT I-STAT, CHEM 8
BUN: 11 mg/dL (ref 6–23)
Chloride: 106 mEq/L (ref 96–112)
Potassium: 4.3 mEq/L (ref 3.5–5.1)
Sodium: 139 mEq/L (ref 135–145)

## 2010-09-23 LAB — CBC
HCT: 33.7 % — ABNORMAL LOW (ref 39.0–52.0)
MCV: 80.2 fL (ref 78.0–100.0)
RBC: 4.2 MIL/uL — ABNORMAL LOW (ref 4.22–5.81)
WBC: 10.5 10*3/uL (ref 4.0–10.5)

## 2010-09-24 ENCOUNTER — Telehealth: Payer: Self-pay | Admitting: Cardiology

## 2010-09-24 NOTE — Telephone Encounter (Signed)
Left a message to call back.

## 2010-09-24 NOTE — Telephone Encounter (Signed)
dtr calling re some questions she has about pt being in the hospital for 2 days, told he had fluid under his lung

## 2010-09-25 NOTE — Telephone Encounter (Signed)
Patient called because she is concern about the fluid in pt's lung that pt was made aware which was viewed in a chest  xray's done  during the hospital admission  order by Dr. [name] PCP. Pt's wife was recommended to call Dr. [name] and make an appointment for pt. To be seen and address those concerns. Pt's wife verbalized understanding.

## 2010-09-25 NOTE — Telephone Encounter (Signed)
Pt's wife called and would like for you to call her back after 4:30.

## 2010-09-28 ENCOUNTER — Telehealth: Payer: Self-pay | Admitting: *Deleted

## 2010-09-28 ENCOUNTER — Telehealth: Payer: Self-pay | Admitting: Cardiology

## 2010-09-28 NOTE — Telephone Encounter (Signed)
Pt has fluid under his lungs per wife from ER visit, pt has Parkinson's, pt has gained 32lbs, made appt, will cxl message

## 2010-09-28 NOTE — Telephone Encounter (Signed)
Attempted to call pt multiple times but line stays busy.

## 2010-09-28 NOTE — Telephone Encounter (Signed)
Wife is calling asking for appt due to mutliple visits to ER at [facility].

## 2010-09-28 NOTE — Telephone Encounter (Signed)
Appt scheduled tomorrow with Dr. Fry. 

## 2010-09-29 ENCOUNTER — Ambulatory Visit (INDEPENDENT_AMBULATORY_CARE_PROVIDER_SITE_OTHER): Payer: Medicare Other | Admitting: Family Medicine

## 2010-09-29 ENCOUNTER — Ambulatory Visit: Payer: Medicare Other | Admitting: Family Medicine

## 2010-09-29 ENCOUNTER — Encounter: Payer: Self-pay | Admitting: Family Medicine

## 2010-09-29 VITALS — BP 100/66 | HR 98 | Temp 97.8°F | Wt 240.0 lb

## 2010-09-29 DIAGNOSIS — F411 Generalized anxiety disorder: Secondary | ICD-10-CM

## 2010-09-29 DIAGNOSIS — G20A1 Parkinson's disease without dyskinesia, without mention of fluctuations: Secondary | ICD-10-CM

## 2010-09-29 DIAGNOSIS — G20 Parkinson's disease: Secondary | ICD-10-CM

## 2010-09-29 DIAGNOSIS — F419 Anxiety disorder, unspecified: Secondary | ICD-10-CM

## 2010-09-29 DIAGNOSIS — R600 Localized edema: Secondary | ICD-10-CM

## 2010-09-29 DIAGNOSIS — R609 Edema, unspecified: Secondary | ICD-10-CM

## 2010-09-29 MED ORDER — CARBIDOPA-LEVODOPA 25-100 MG PO TABS: 1.0000 | ORAL_TABLET | Freq: Three times a day (TID) | ORAL | Status: DC

## 2010-09-29 MED ORDER — CLONAZEPAM 0.5 MG PO TABS: 0.5000 mg | ORAL_TABLET | Freq: Three times a day (TID) | ORAL | Status: DC

## 2010-09-29 MED ORDER — FUROSEMIDE 20 MG PO TABS: 20.0000 mg | ORAL_TABLET | Freq: Every day | ORAL | Status: DC

## 2010-09-29 NOTE — Progress Notes (Signed)
  Subjective:    Patient ID: [name], male    DOB: [birth year], 70 y.o.   MRN: [id number]  HPI Here to follow up an ER visit on 09-22-10 for intermittent generalized numbness. That day his exam was at baseline, and his labs were normal. CXR revealed possible small bilateral pleural effusions. His dosage of Sinemet had been increased a bit by Dr. Vickey Huger about a week before, but I do not think this was responsible for the numbness. It was suggested that this could related to his anxiety, and the family agrees that he has been much more anxious than usual for several weeks. They say he sometimes gets excited and breathes rapidly. He has had some swelling in the feet off and on, but no SOB.    Review of Systems  Constitutional: Negative.   Respiratory: Negative.   Cardiovascular: Positive for leg swelling. Negative for chest pain and palpitations.  Psychiatric/Behavioral: Positive for agitation. Negative for hallucinations and confusion. The patient is nervous/anxious.        Objective:   Physical Exam  Constitutional: He appears well-developed and well-nourished.       He looks quite good, alert, walks with his cane   Cardiovascular: Normal rate, regular rhythm and normal heart sounds.  Exam reveals no gallop and no friction rub.   No murmur heard. Pulmonary/Chest: Effort normal. No respiratory distress. He has no wheezes. He has no rales.  Musculoskeletal:       1+ ankle edema          Assessment & Plan:  It seems that he hyperventilates when he gets anxious, and I believe this is causing the numbness he describes. We will increase his Clonazepam from 1/2 a tablet to a whole 0.5 mg tablet tid. Add low dose Lasix. He is due to see Dr. Jens Som later this week.

## 2010-09-30 ENCOUNTER — Encounter: Payer: Self-pay | Admitting: Cardiology

## 2010-09-30 ENCOUNTER — Ambulatory Visit (INDEPENDENT_AMBULATORY_CARE_PROVIDER_SITE_OTHER): Payer: Medicare Other | Admitting: Cardiology

## 2010-09-30 DIAGNOSIS — I951 Orthostatic hypotension: Secondary | ICD-10-CM

## 2010-09-30 DIAGNOSIS — Z9889 Other specified postprocedural states: Secondary | ICD-10-CM

## 2010-09-30 DIAGNOSIS — E785 Hyperlipidemia, unspecified: Secondary | ICD-10-CM

## 2010-09-30 DIAGNOSIS — I4891 Unspecified atrial fibrillation: Secondary | ICD-10-CM

## 2010-09-30 DIAGNOSIS — I2510 Atherosclerotic heart disease of native coronary artery without angina pectoris: Secondary | ICD-10-CM

## 2010-09-30 DIAGNOSIS — I059 Rheumatic mitral valve disease, unspecified: Secondary | ICD-10-CM

## 2010-09-30 NOTE — Assessment & Plan Note (Signed)
Watch carefully for any exacerbation by low-dose diuretic.

## 2010-09-30 NOTE — Progress Notes (Signed)
HPI: Pleasant male with past medical history of coronary artery disease and atrial fibrillation for followup. Patient admitted in July of 2011 with chest pain. Cardiac catheterization revealed an ejection fraction of 55-60% and significant mitral regurgitation. The right coronary artery had no significant stenosis. There was a 40% circumflex. The proximal portion of the left anterior descending artery has about 90% narrowing. Echocardiogram showed normal LV function and severe mitral regurgitation. On July 12 of 2011 the patient underwent two-vessel coronary artery bypass grafting (left internal mammary artery to LAD, saphenous vein graft to obtuse marginal) and MV repair with a 22-mm Edwards ring and oversewing of left atrial appendage. Echo in Oct 2011 revealed mild systolic dysfunction, EF 45-50%; s/p mitral valve repair. There was no significant stenosis. There was a very eccentric jet of mitral regurgitation that was poorly characterized. It appeared mild but given a very complete CW doppler jet it could be more moderate. Normal RV size with mild systolic dysfunction. Mild pulmonary hypertension. Patient also with orthostatic hypotension. Since I last saw him in May 2012, he is being evaluated by neurology for total body numbness. His medications have been adjusted with some improvement. A recent chest x-ray showed small bilateral pleural effusions versus thickening. Lasix 20 mg daily for 7 days was added to his medical regimen. The patient denies dyspnea or chest pain. He did note increased weight.   Current Outpatient Prescriptions  Medication Sig Dispense Refill  . aspirin 81 MG tablet Take 81 mg by mouth daily.        . carbidopa-levodopa (SINEMET) 25-100 MG per tablet Take 1 tablet by mouth 3 (three) times daily.  1 tablet  0  . clonazePAM (KLONOPIN) 0.5 MG tablet Take 1 tablet (0.5 mg total) by mouth 3 (three) times daily with meals. Take 1/2 tab at each meal and 1 tab at bedtime.  90 tablet  5  .  furosemide (LASIX) 20 MG tablet Take 1 tablet (20 mg total) by mouth daily.  30 tablet  11  . Multiple Vitamin (MULTIVITAMIN) tablet Take 1 tablet by mouth daily.        . pantoprazole (PROTONIX) 40 MG tablet Take 40 mg by mouth 2 (two) times daily.        . polyethylene glycol (MIRALAX / GLYCOLAX) packet Take 17 g by mouth daily.        . promethazine (PHENERGAN) 25 MG tablet Take 25 mg by mouth 2 (two) times daily as needed.       . rosuvastatin (CRESTOR) 10 MG tablet Take 10 mg by mouth daily.        . sertraline (ZOLOFT) 50 MG tablet Take 50 mg by mouth daily.        . Tamsulosin HCl (FLOMAX) 0.4 MG CAPS Take 0.4 mg by mouth daily.        . traZODone (DESYREL) 150 MG tablet Take 150 mg by mouth at bedtime.           Past Medical History  Diagnosis Date  . Hypertension   . Atrial fibrillation   . PE (pulmonary embolism)   . DVT (deep venous thrombosis)   . Meningioma   . BPH (benign prostatic hyperplasia)   . Colon polyps   . Venereal wart   . Depression   . Asthma   . Chickenpox   . Thrombophlebitis   . GERD (gastroesophageal reflux disease)   . Parkinson disease   . Dementia   . CAD (coronary artery disease)   .  History of colonoscopy     Past Surgical History  Procedure Date  . Hernia repair   . Tonsilectomy, adenoidectomy, bilateral myringotomy and tubes   . Varicose vein surgery     left leg  . Back surgery     herniated disc & remove bone spurs  . Coronary artery bypass graft     History   Social History  . Marital Status: Married    Spouse Name: N/A    Number of Children: N/A  . Years of Education: N/A   Occupational History  . Not on file.   Social History Main Topics  . Smoking status: Never Smoker   . Smokeless tobacco: Never Used  . Alcohol Use: No  . Drug Use: No  . Sexually Active: Not on file   Other Topics Concern  . Not on file   Social History Narrative  . No narrative on file    ROS: no fevers or chills, productive cough,  hemoptysis, dysphasia, odynophagia, melena, hematochezia, dysuria, hematuria, rash, seizure activity, orthopnea, PND, pedal edema, claudication. Remaining systems are negative.  Physical Exam: Well-developed well-nourished in no acute distress.  Skin is warm and dry.  HEENT is normal.  Neck is supple. No thyromegaly.  Chest is clear to auscultation with normal expansion.  Cardiovascular exam is regular rate and rhythm.  Abdominal exam nontender or distended. No masses palpated. Extremities show no edema. neuro significant for Parkinson's  ECG sinus tachycardia at a rate of 101. No ST changes.

## 2010-09-30 NOTE — Assessment & Plan Note (Signed)
Patient remains in sinus rhythm. Continue aspirin. Not a Coumadin candidate given Parkinson's and history of falls.

## 2010-09-30 NOTE — Assessment & Plan Note (Signed)
Continue statin. 

## 2010-09-30 NOTE — Assessment & Plan Note (Signed)
Continue aspirin and statin. 

## 2010-09-30 NOTE — Patient Instructions (Signed)
Your physician has requested that you have an echocardiogram. Echocardiography is a painless test that uses sound waves to create images of your heart. It provides your doctor with information about the size and shape of your heart and how well your heart's chambers and valves are working. This procedure takes approximately one hour. There are no restrictions for this procedure.  Your physician wants you to follow-up in: 3 months. You will receive a reminder letter in the mail two months in advance. If you don't receive a letter, please call our office to schedule the follow-up appointment.  

## 2010-09-30 NOTE — Assessment & Plan Note (Signed)
History of mitral valve repair. Question small pleural effusions on recent chest x-ray. Patient denies dyspnea. Given Lasix 20 mg daily for 7 days by his primary care physician. We will follow closely for any worsening of orthostatic symptoms which had been an issue in the past. Plan repeat echocardiogram to assess MR. Continue SBE prophylaxis.

## 2010-10-06 ENCOUNTER — Encounter: Payer: Self-pay | Admitting: Cardiology

## 2010-10-06 ENCOUNTER — Other Ambulatory Visit (HOSPITAL_COMMUNITY): Payer: Medicare Other

## 2010-10-14 ENCOUNTER — Telehealth: Payer: Self-pay | Admitting: Family Medicine

## 2010-10-14 NOTE — Telephone Encounter (Signed)
Pt was put on Klonopin for spasms. Pt was dx with fluid under lungs and pts face and hands swollen. Pt has been having episodes of being numb from pts neck all the way down. Pt wants to know how long he will be on Klonopin and oes Klonapin interfere with any of the other pts meds or with pts heart condition? Pls call pts wife asap.

## 2010-10-15 NOTE — Telephone Encounter (Signed)
Wife calling again requesting info about  Med and could this be causing neck stiffness?    Called pt back 3 times to let them know dr fry is off this pm, but there was no answer nor answering machine

## 2010-10-16 NOTE — Telephone Encounter (Signed)
No, the Klonopin should have no effect on his Parkinsons meds either.

## 2010-10-16 NOTE — Telephone Encounter (Signed)
Does this include no interference with his Parkinson's medication? Dr Jens Som has him on Crestor. He is having neck stiffness, left arm numbness, shortness of breath. Saw Dr Dohmeier on 10-09-2010. She did not know what was wrong. Wife says that he has had this episodes.

## 2010-10-16 NOTE — Telephone Encounter (Signed)
Tell them that Klonopin is safe for him to take and that it does not affect his heart in any way. It does not interact with any of his meds either

## 2010-10-16 NOTE — Telephone Encounter (Signed)
Pt's wife called and just wanted to know if any of the medications could be causing pain all over and numbness? I went over the below message from Dr. [name]. Advised to call if any more pain.

## 2010-11-11 ENCOUNTER — Other Ambulatory Visit: Payer: Self-pay | Admitting: Cardiology

## 2010-11-11 LAB — LUPUS ANTICOAGULANT PANEL
Lupus Anticoagulant: NOT DETECTED
PTT Lupus Anticoagulant: 53.4 — ABNORMAL HIGH (ref 36.3–48.8)
dRVVT Incubated 1:1 Mix: 41.4 (ref 36.1–47.0)

## 2010-11-11 LAB — BODY FLUID CELL COUNT WITH DIFFERENTIAL
Lymphs, Fluid: 15
Monocyte-Macrophage-Serous Fluid: 28 — ABNORMAL LOW
Neutrophil Count, Fluid: 6

## 2010-11-11 LAB — URINALYSIS, ROUTINE W REFLEX MICROSCOPIC
Glucose, UA: NEGATIVE
Nitrite: NEGATIVE
Specific Gravity, Urine: 1.015
pH: 6.5

## 2010-11-11 LAB — PROTIME-INR
INR: 1.4
INR: 1.6 — ABNORMAL HIGH
INR: 2.3 — ABNORMAL HIGH
INR: 2.7 — ABNORMAL HIGH
Prothrombin Time: 17.5 — ABNORMAL HIGH
Prothrombin Time: 27.3 — ABNORMAL HIGH
Prothrombin Time: 29.7 — ABNORMAL HIGH
Prothrombin Time: 35.2 — ABNORMAL HIGH

## 2010-11-11 LAB — CBC
HCT: 33.5 — ABNORMAL LOW
MCHC: 33.6
MCV: 93.6
MCV: 94.6
Platelets: 314
RBC: 3.55 — ABNORMAL LOW
RBC: 3.58 — ABNORMAL LOW
RDW: 12.9
WBC: 6.7

## 2010-11-11 LAB — BODY FLUID CULTURE: Culture: NO GROWTH

## 2010-11-11 LAB — BETA-2-GLYCOPROTEIN I ABS, IGG/M/A
Beta-2 Glyco I IgG: <4 U/mL (ref <20)
Beta-2-Glycoprotein I IgA: <4 U/mL (ref <10)
Beta-2-Glycoprotein I IgM: <4 U/mL (ref <10)

## 2010-11-11 LAB — GLUCOSE, SEROUS FLUID: Glucose, Fluid: 85

## 2010-11-11 LAB — PROTEIN, BODY FLUID: Total protein, fluid: 3.3

## 2010-11-11 LAB — CARDIOLIPIN ANTIBODIES, IGG, IGM, IGA: Anticardiolipin IgA: 15 (ref <13)

## 2010-11-11 LAB — PSA: PSA: 2.38

## 2010-11-11 LAB — PROTEIN S, TOTAL: Protein S Ag, Total: 171 % — ABNORMAL HIGH (ref 70–140)

## 2010-11-11 LAB — PROTEIN C, TOTAL: Protein C, Total: 59 % — ABNORMAL LOW (ref 70–140)

## 2010-11-17 ENCOUNTER — Emergency Department (HOSPITAL_BASED_OUTPATIENT_CLINIC_OR_DEPARTMENT_OTHER)
Admission: EM | Admit: 2010-11-17 | Discharge: 2010-11-17 | Disposition: A | Discharge status: Other Acute Inpatient Hospital | Payer: Medicare Other | Source: Home / Self Care | Attending: Emergency Medicine | Admitting: Emergency Medicine

## 2010-11-17 ENCOUNTER — Emergency Department (INDEPENDENT_AMBULATORY_CARE_PROVIDER_SITE_OTHER): Payer: Medicare Other

## 2010-11-17 ENCOUNTER — Other Ambulatory Visit: Payer: Self-pay

## 2010-11-17 ENCOUNTER — Telehealth: Payer: Self-pay | Admitting: Cardiology

## 2010-11-17 ENCOUNTER — Inpatient Hospital Stay (HOSPITAL_COMMUNITY)
Admission: AD | Admit: 2010-11-17 | Discharge: 2010-11-20 | DRG: 093 | Disposition: A | Discharge status: Home / Self Care | Payer: Medicare Other | Source: Other Acute Inpatient Hospital | Attending: Internal Medicine | Admitting: Internal Medicine

## 2010-11-17 ENCOUNTER — Encounter (HOSPITAL_BASED_OUTPATIENT_CLINIC_OR_DEPARTMENT_OTHER): Payer: Self-pay | Admitting: *Deleted

## 2010-11-17 ENCOUNTER — Emergency Department (HOSPITAL_BASED_OUTPATIENT_CLINIC_OR_DEPARTMENT_OTHER): Payer: Medicare Other

## 2010-11-17 DIAGNOSIS — Z7982 Long term (current) use of aspirin: Secondary | ICD-10-CM

## 2010-11-17 DIAGNOSIS — I2510 Atherosclerotic heart disease of native coronary artery without angina pectoris: Secondary | ICD-10-CM | POA: Diagnosis present

## 2010-11-17 DIAGNOSIS — F3289 Other specified depressive episodes: Secondary | ICD-10-CM | POA: Diagnosis present

## 2010-11-17 DIAGNOSIS — F0280 Dementia in other diseases classified elsewhere without behavioral disturbance: Secondary | ICD-10-CM | POA: Insufficient documentation

## 2010-11-17 DIAGNOSIS — I4891 Unspecified atrial fibrillation: Secondary | ICD-10-CM | POA: Diagnosis not present

## 2010-11-17 DIAGNOSIS — J90 Pleural effusion, not elsewhere classified: Secondary | ICD-10-CM

## 2010-11-17 DIAGNOSIS — R0602 Shortness of breath: Secondary | ICD-10-CM | POA: Insufficient documentation

## 2010-11-17 DIAGNOSIS — E039 Hypothyroidism, unspecified: Secondary | ICD-10-CM | POA: Diagnosis present

## 2010-11-17 DIAGNOSIS — I10 Essential (primary) hypertension: Secondary | ICD-10-CM | POA: Insufficient documentation

## 2010-11-17 DIAGNOSIS — G9389 Other specified disorders of brain: Secondary | ICD-10-CM

## 2010-11-17 DIAGNOSIS — F341 Dysthymic disorder: Secondary | ICD-10-CM | POA: Insufficient documentation

## 2010-11-17 DIAGNOSIS — R1310 Dysphagia, unspecified: Secondary | ICD-10-CM | POA: Diagnosis present

## 2010-11-17 DIAGNOSIS — N400 Enlarged prostate without lower urinary tract symptoms: Secondary | ICD-10-CM | POA: Diagnosis present

## 2010-11-17 DIAGNOSIS — M6281 Muscle weakness (generalized): Secondary | ICD-10-CM

## 2010-11-17 DIAGNOSIS — R209 Unspecified disturbances of skin sensation: Secondary | ICD-10-CM | POA: Insufficient documentation

## 2010-11-17 DIAGNOSIS — Z79899 Other long term (current) drug therapy: Secondary | ICD-10-CM | POA: Insufficient documentation

## 2010-11-17 DIAGNOSIS — Z86718 Personal history of other venous thrombosis and embolism: Secondary | ICD-10-CM | POA: Insufficient documentation

## 2010-11-17 DIAGNOSIS — R5381 Other malaise: Secondary | ICD-10-CM

## 2010-11-17 DIAGNOSIS — I639 Cerebral infarction, unspecified: Secondary | ICD-10-CM

## 2010-11-17 DIAGNOSIS — Z01812 Encounter for preprocedural laboratory examination: Secondary | ICD-10-CM

## 2010-11-17 DIAGNOSIS — I6350 Cerebral infarction due to unspecified occlusion or stenosis of unspecified cerebral artery: Secondary | ICD-10-CM | POA: Insufficient documentation

## 2010-11-17 DIAGNOSIS — Z951 Presence of aortocoronary bypass graft: Secondary | ICD-10-CM

## 2010-11-17 DIAGNOSIS — F329 Major depressive disorder, single episode, unspecified: Secondary | ICD-10-CM | POA: Diagnosis present

## 2010-11-17 DIAGNOSIS — K219 Gastro-esophageal reflux disease without esophagitis: Secondary | ICD-10-CM | POA: Insufficient documentation

## 2010-11-17 DIAGNOSIS — R05 Cough: Secondary | ICD-10-CM

## 2010-11-17 DIAGNOSIS — G3183 Dementia with Lewy bodies: Secondary | ICD-10-CM | POA: Diagnosis present

## 2010-11-17 DIAGNOSIS — Z954 Presence of other heart-valve replacement: Secondary | ICD-10-CM

## 2010-11-17 LAB — CBC
HCT: 32.5 % — ABNORMAL LOW (ref 39.0–52.0)
Hemoglobin: 10.2 g/dL — ABNORMAL LOW (ref 13.0–17.0)
RDW: 16.3 % — ABNORMAL HIGH (ref 11.5–15.5)
WBC: 12.2 10*3/uL — ABNORMAL HIGH (ref 4.0–10.5)

## 2010-11-17 LAB — COMPREHENSIVE METABOLIC PANEL
ALT: 6 U/L (ref 0–53)
AST: 16 U/L (ref 0–37)
Albumin: 3.7 g/dL (ref 3.5–5.2)
Alkaline Phosphatase: 64 U/L (ref 39–117)
CO2: 26 mEq/L (ref 19–32)
Chloride: 102 mEq/L (ref 96–112)
Creatinine, Ser: 1.4 mg/dL — ABNORMAL HIGH (ref 0.50–1.35)
GFR calc Af Amer: >60 mL/min (ref >60)
GFR calc non Af Amer: 50 mL/min — ABNORMAL LOW (ref >60)
Potassium: 4.1 mEq/L (ref 3.5–5.1)
Total Bilirubin: 0.6 mg/dL (ref 0.3–1.2)

## 2010-11-17 LAB — URINALYSIS, ROUTINE W REFLEX MICROSCOPIC
Leukocytes, UA: NEGATIVE
Nitrite: NEGATIVE
Specific Gravity, Urine: 1.01 (ref 1.005–1.030)
pH: 7.5 (ref 5.0–8.0)

## 2010-11-17 LAB — DIFFERENTIAL
Basophils Absolute: 0 10*3/uL (ref 0.0–0.1)
Basophils Relative: 0 % (ref 0–1)
Lymphocytes Relative: 12 % (ref 12–46)
Monocytes Absolute: 0.9 10*3/uL (ref 0.1–1.0)
Neutro Abs: 9.6 10*3/uL — ABNORMAL HIGH (ref 1.7–7.7)
Neutrophils Relative %: 79 % — ABNORMAL HIGH (ref 43–77)

## 2010-11-17 LAB — TROPONIN I: Troponin I: <0.3 ng/mL (ref <0.30)

## 2010-11-17 MED ORDER — SODIUM CHLORIDE 0.9 % IV BOLUS (SEPSIS)
500.0000 mL | Freq: Once | INTRAVENOUS | Status: AC
  Administered 2010-11-17: 500 mL via INTRAVENOUS

## 2010-11-17 NOTE — ED Notes (Signed)
EMS sts pt c/o whole body numbness, SOB and blurry vision intermittently since this a.m. EMS placed a 20g in pt's left hand and ran a 12 lead which shows NSR at a rate of 92 BPM. EMS reports pt's BP was in the 110's systolic.

## 2010-11-17 NOTE — ED Notes (Signed)
Pt returned from CT. NS bolus complete. Iv site unremarkable. Pt CAO and in NAD. Pt denies numbness, blurred vision or pain at this time. NSR on monitor with a rate of 98 bpm.

## 2010-11-17 NOTE — ED Provider Notes (Addendum)
History     CSN: [id number] Arrival date & time: 11/17/2010  3:30 PM  Chief Complaint  Patient presents with  . Numbness    HPI  (Consider location/radiation/quality/duration/timing/severity/associated sxs/prior treatment)  HPI  Patient with episodic bouts of whole body numbness, blurry vision.  Patient seen for same twice in August two days in a row 8/3 and 8/4.  Patient seen outpatient afterwards by Dr. Vickey Huger.  Wife states no definite diagnosis but that thought to be a "neurological problem."  These episodes occurred three times previously in July and presented to ed- wife states sent home each time.  Yesterday patient had episode of blurry vision , numb all over, for 35 minutes  Night med changed from ativan to trazodone, zoloft, and klonopin.  .  Wife gave klonopin like Dr. Vickey Huger had instructed and felt better after 30 minutes.  This a.m.  Slept in until 0900.This ate breakfast  And lunch.  At lunch worsening of numbness, blurred vision, and weakness.  Patient states feels fine now.    Past Medical History  Diagnosis Date  . Hypertension   . Atrial fibrillation   . PE (pulmonary embolism)   . DVT (deep venous thrombosis)   . Meningioma   . BPH (benign prostatic hyperplasia)   . Colon polyps   . Venereal wart   . Depression   . Asthma   . Chickenpox   . Thrombophlebitis   . GERD (gastroesophageal reflux disease)   . Parkinson disease   . Dementia   . CAD (coronary artery disease)   . History of colonoscopy     Past Surgical History  Procedure Date  . Hernia repair   . Tonsilectomy, adenoidectomy, bilateral myringotomy and tubes   . Varicose vein surgery     left leg  . Back surgery     herniated disc & remove bone spurs  . Coronary artery bypass graft     Family History  Problem Relation Age of Onset  . Arthritis    . Diabetes    . Stroke    . Heart disease      History  Substance Use Topics  . Smoking status: Never Smoker   . Smokeless tobacco:  Never Used  . Alcohol Use: No      Review of Systems  Review of Systems  All other systems reviewed and are negative.    Allergies  Ibuprofen  Home Medications   Current Outpatient Rx  Name Route Sig Dispense Refill  . ASPIRIN 81 MG PO TABS Oral Take 81 mg by mouth daily.      Marland Kitchen CARBIDOPA-LEVODOPA 25-100 MG PO TABS Oral Take 1 tablet by mouth 3 (three) times daily. 1 tablet 0  . CLONAZEPAM 0.5 MG PO TABS Oral Take 1 tablet (0.5 mg total) by mouth 3 (three) times daily with meals. Take 1/2 tab at each meal and 1 tab at bedtime. 90 tablet 5  . CRESTOR 10 MG PO TABS  TAKE 1 TABLET DAILY 30 each 11  . FUROSEMIDE 20 MG PO TABS Oral Take 1 tablet (20 mg total) by mouth daily. 30 tablet 11  . ONE-DAILY MULTI VITAMINS PO TABS Oral Take 1 tablet by mouth daily.      Marland Kitchen PANTOPRAZOLE SODIUM 40 MG PO TBEC Oral Take 40 mg by mouth 2 (two) times daily.      Marland Kitchen POLYETHYLENE GLYCOL 3350 PO PACK Oral Take 17 g by mouth daily.      Marland Kitchen PROMETHAZINE HCL 25 MG  PO TABS Oral Take 25 mg by mouth 2 (two) times daily as needed. nausea    . SERTRALINE HCL 50 MG PO TABS Oral Take 50 mg by mouth daily.      Marland Kitchen TAMSULOSIN HCL 0.4 MG PO CAPS Oral Take 0.4 mg by mouth daily.      . TRAZODONE HCL 150 MG PO TABS Oral Take 150 mg by mouth at bedtime.        Physical Exam    Pulse 103  Temp(Src) 98.7 F (37.1 C) (Oral)  Resp 18  Ht 6\' 3"  (1.905 m)  Wt 240 lb (108.863 kg)  BMI 30.00 kg/m2  SpO2 100%  Physical Exam  Vitals reviewed. Constitutional: He is oriented to person, place, and time. He appears well-developed and well-nourished.  HENT:  Head: Normocephalic and atraumatic.  Eyes: Conjunctivae are normal. Pupils are equal, round, and reactive to light.  Neck: Normal range of motion. Neck supple.  Cardiovascular: Normal rate, regular rhythm, normal heart sounds and intact distal pulses.   Pulmonary/Chest: Effort normal and breath sounds normal.  Abdominal: Soft. Bowel sounds are normal.    Musculoskeletal: Normal range of motion.  Neurological: He is alert and oriented to person, place, and time. He has normal reflexes. He displays no atrophy and no tremor. No cranial nerve deficit or sensory deficit. He exhibits normal muscle tone. He displays a negative Romberg sign. He displays no seizure activity.  Skin: Skin is warm and dry.  Psychiatric: He has a normal mood and affect.    ED Course  Procedures (including critical care time)  Labs Reviewed  CBC - Abnormal; Notable for the following:    WBC 12.2 (*)    RBC 4.06 (*)    Hemoglobin 10.2 (*)    HCT 32.5 (*)    MCH 25.1 (*)    RDW 16.3 (*)    All other components within normal limits  DIFFERENTIAL - Abnormal; Notable for the following:    Neutrophils Relative 79 (*)    Neutro Abs 9.6 (*)    All other components within normal limits  URINALYSIS, ROUTINE W REFLEX MICROSCOPIC  COMPREHENSIVE METABOLIC PANEL   No results found.   No diagnosis found.   MDM Discussed the original CT with the radiologist would read it. There is an area that cannot be ruled out for infarct. The symptoms have been present since sometime yesterday. I tend to get MRI but was unable to obtain MRI secondary the patient has a mitral valve which could not be cleared. I spoke with Dr. Joneen Roach and patient will be admitted to [facility] for MRI tomorrow.    Date: 11/30/2010  Rate: 101  Rhythm: sinus tachycardia  QRS Axis: left  Intervals: normal  ST/T Wave abnormalities: nonspecific ST changes  Conduction Disutrbances:nonspecific intraventricular conduction delay  Narrative Interpretation:   Old EKG Reviewed: changes noted        Hilario Quarry, MD 11/18/10 1736  Hilario Quarry, MD 11/30/10 1138  Hilario Quarry, MD 11/30/10 1144  Hilario Quarry, MD 11/30/10 1144

## 2010-11-17 NOTE — ED Notes (Signed)
Pt requesting something to eat. Pt to remain NPO at this time per Dr. Rosalia Hammers.

## 2010-11-17 NOTE — Telephone Encounter (Signed)
Patient wife calling. C/O leg, feet, groin area are numb. Chest -tightness. H/O parkinson disease. Wife wants to know should medication be look at.

## 2010-11-17 NOTE — Telephone Encounter (Signed)
Spoke with rhonda, the pt is having episodes where he will start with numbness in the right neck and gradually after about 15 min his whole body will get numb. She gives him a klonopin when it starts but it takes 30 min for it to help. He is unable to see during the episodes and has chest tightness. The chest tightness only occurs when having an episode. No SOB. She has also talked to the neurologist and is waiting for a call back. They will call back if we need to see the pt but feel this is not a cardiac issue Deliah Goody

## 2010-11-17 NOTE — ED Notes (Signed)
Pt able to rise and stand unassisted. Pt steady on his feet and denies dizziness, numbness or blurred vision.

## 2010-11-17 NOTE — ED Notes (Signed)
Pt's wife sts pt had similar episode the first of August but all of his tests were negative.

## 2010-11-18 ENCOUNTER — Inpatient Hospital Stay (HOSPITAL_COMMUNITY): Payer: Medicare Other

## 2010-11-18 DIAGNOSIS — R0989 Other specified symptoms and signs involving the circulatory and respiratory systems: Secondary | ICD-10-CM

## 2010-11-18 DIAGNOSIS — I369 Nonrheumatic tricuspid valve disorder, unspecified: Secondary | ICD-10-CM

## 2010-11-18 LAB — CARDIAC PANEL(CRET KIN+CKTOT+MB+TROPI): Total CK: 60 U/L (ref 7–232)

## 2010-11-18 LAB — LIPID PANEL
LDL Cholesterol: 61 mg/dL (ref 0–99)
Triglycerides: 68 mg/dL (ref <150)
VLDL: 14 mg/dL (ref 0–40)

## 2010-11-18 LAB — CBC
Platelets: 177 10*3/uL (ref 150–400)
RDW: 16.4 % — ABNORMAL HIGH (ref 11.5–15.5)
WBC: 6.4 10*3/uL (ref 4.0–10.5)

## 2010-11-18 LAB — MRSA PCR SCREENING: MRSA by PCR: NEGATIVE

## 2010-11-18 LAB — PROTIME-INR: INR: 1.25 (ref 0.00–1.49)

## 2010-11-18 LAB — BASIC METABOLIC PANEL
CO2: 26 mEq/L (ref 19–32)
Calcium: 8.5 mg/dL (ref 8.4–10.5)
Glucose, Bld: 86 mg/dL (ref 70–99)
Sodium: 140 mEq/L (ref 135–145)

## 2010-11-18 LAB — HEMOGLOBIN A1C: Mean Plasma Glucose: 105 mg/dL (ref <117)

## 2010-11-18 NOTE — H&P (Signed)
NAMEROBIE, [name] NO.:  [id number]  MEDICAL RECORD NO.:  [id number]  LOCATION:  3041                         FACILITY:  [facility]  PHYSICIAN:  Gery Pray, MD      DATE OF BIRTH:  03/16/1940  DATE OF ADMISSION:  11/17/2010 DATE OF DISCHARGE:                             HISTORY & PHYSICAL   PRIMARY CARE PHYSICIAN:  Tera Mater. [name], MD  NEUROLOGIST:  Nelle Don.  CODE STATUS:  Full code.    The patient goes to team 5.  CHIEF COMPLAINT:  Numbness.  HISTORY OF PRESENT ILLNESS:  This is a 70 year old gentleman with an extensive medical and past medical history.  He has Parkinson disease with some dementia.  He states that approximately a month ago, he started developing this full body numbness.  It has been occurring almost once a week.  He has seen his neurologist further, but no clear answers have been told to date.  This occurred twice today. His wife was concerned, she called 911 and he was brought to the ER.  He describes what happens as a full body numbness.  He states he gets blurred vision.  He has trouble breathing.  There was no loss of consciousness.  No chest pain.  He has palpitation.  He states that when EMS came today, his blood pressure was also low.  He reports during these episodes, he is not really aware of what was going on around him.  He has a headache after the episode which  is generalized.  He does have intermittent tinnitus, but this occurs more with episodes.  He also becomes short of breath.  He states he is not quite sure how to describe it, he just feels as though he is about to die.  He reports no fevers, no chills, no nausea or vomiting, no burning urination.  He reports no increased anxiety.   He has been having progressively worsening dysphagia.  However, he does not report any other neurological decline.  He does ambulate with a walker. He states that he is very limited in his ambulation.  The patient states that he  feels quite weak with these episodes, but he does not report any dizziness or any lightheadedness.  He does not report feeling presyncopal.  As stated above, the patient states he is having worsening dysphagia. His most recent diet includes soft foods and no meats.  History obtained from the patient who currently appears reliable.  REVIEW OF SYSTEMS:  All 10-point systems reviewed and are negative except as noted in the HPI.  PAST MEDICAL HISTORY: 1. Coronary artery disease with mitral valve replacement and a history     of atrial fibrillation.  He still has a history of PEs. 2. Hypothyroidism. 3. Parkinson disease with the mitral component. 4. BPH. 5. Depression. 6. Hypotension. 7. History of aspiration pneumonia.  PAST SURGICAL HISTORY:  CABG and a mitral valve replacement on July 2011.  MEDICATIONS:  Unable to obtain.  ALLERGIES:  The patient states he is allergic to MOTRIN and IBUPROFEN.  SOCIAL HISTORY:  Negative history for tobacco, alcohol, or illicit drugs.  No home oxygen.  Lives with his wife.  He ambulates  with a cane.FAMILY HISTORY:  Significant for coronary artery disease.  PHYSICAL EXAMINATION:  VITALS:  AVSS. GENERAL:  Alert, oriented and pleasant male. EYES:  Pink conjunctivae.  PERRLA. ENT:  Moist oral mucosa.  Trachea midline. NECK:  Supple.  No thyromegaly. LUNGS: clear to auscultation, no wheezing, no use of acessory muscles CARDIOVASCULAR: regular rate and rythmn, no JVD, no murmurs ABDOMEN:  Soft.  Positive bowel sounds, nontender, and nondistended.  No organomegaly. NEURO:  Cranial nerves II through XII appear grossly intact.  Sensation intact. MUSCULOSKELETAL:  The patient does have some tremors, certainly due to Parkinson disease.  However, his strength is essentially 5/5 in all extremities. No edema. SKIN:  No rashes or crepitation. PSYCH:  Alert and oriented male.  IMAGING: CT head shows apparent blurring of the gray-white differentiation  with anterior aspect of left frontal lobe, possible artifactual, though infarct or edema may have similar appearance.  Chest x-ray shows chronic extensive changes throughout the lungs.  LABORATORY DATA:  White blood count 12.2, hemoglobin 10.2 and platelets 195.  D-dimer was 3.0.  Sodium 139, potassium 4.1, chloride 102, CO2 26, glucose 110, BUN 11, creatinine 1.4 which would be the patient's baseline.  LFTs are normal.  Troponin less than 0.03.  UA is negative.  ASSESSMENT AND PLAN: 1. Numbness, unclear etiology.  [name] CT head has what could     be artifactual changes versus infarction or edema.  This patient     did present to [facility] and he was transferred here to [facility].  I will go ahead and order MRI of the head for better     evaluation.  Full TIA work-up ordered. We will continue the patient's daily aspirin. 2. Parkinson disease with dementia. 3. Dysphasia.  A bedside eval has been done by nursing, which the     patient has failed.  He will remain n.p.o. for now with some gentle     IV fluid hydration.  We will check a vitamin B12 and folate level     just to consult with the consultant.Modified barium swallow ordred. 4. Coronary artery disease/history of mitral valve replacement. 5. Hypothyroidism. 6. Benign prostatic hyperplasia. 7. History of depression. 8. History of atrial fibrillation. 9. History of hypotension, stable.  We will check a TSH currently as     stated.  I do not have a list of the patient's medication.  We will     recommend review in the a.m. for off the patient's home medication.     We will also check a TSH and a INR.          ______________________________ Gery Pray, MD     DC/MEDQ  D:  11/18/2010  T:  11/18/2010  Job:  [id number]  Electronically Signed by Gery Pray MD on 11/18/2010 06:06:15 AM

## 2010-11-19 ENCOUNTER — Inpatient Hospital Stay (HOSPITAL_COMMUNITY): Payer: Medicare Other

## 2010-11-19 ENCOUNTER — Ambulatory Visit: Payer: Medicare Other | Admitting: Family Medicine

## 2010-11-19 DIAGNOSIS — M7989 Other specified soft tissue disorders: Secondary | ICD-10-CM

## 2010-11-19 LAB — URINALYSIS, MICROSCOPIC ONLY
Protein, ur: NEGATIVE mg/dL
Urobilinogen, UA: 1 mg/dL (ref 0.0–1.0)

## 2010-11-19 LAB — T4, FREE: Free T4: 1.08 ng/dL (ref 0.80–1.80)

## 2010-11-19 LAB — FOLATE RBC: RBC Folate: 906 ng/mL — ABNORMAL HIGH (ref >=366)

## 2010-11-19 MED ORDER — XENON XE 133 GAS
10.0000 | GAS_FOR_INHALATION | Freq: Once | RESPIRATORY_TRACT | Status: AC | PRN
  Administered 2010-11-19: 10 via RESPIRATORY_TRACT

## 2010-11-19 MED ORDER — TECHNETIUM TO 99M ALBUMIN AGGREGATED
6.0000 | Freq: Once | INTRAVENOUS | Status: AC | PRN
  Administered 2010-11-19: 6 via INTRAVENOUS

## 2010-11-20 LAB — DIFFERENTIAL
Basophils Absolute: 0
Basophils Relative: 0
Eosinophils Absolute: 0.3
Eosinophils Absolute: 0.2
Eosinophils Relative: 4
Eosinophils Relative: 6 — ABNORMAL HIGH
Lymphocytes Relative: 24
Lymphocytes Relative: 22
Lymphs Abs: 1.2
Lymphs Abs: 1.2
Monocytes Absolute: 0.4
Monocytes Absolute: 0.4
Monocytes Relative: 8
Monocytes Relative: 7
Monocytes Relative: 8
Neutrophils Relative %: 66
Neutrophils Relative %: 67

## 2010-11-20 LAB — URINALYSIS, ROUTINE W REFLEX MICROSCOPIC
Bilirubin Urine: NEGATIVE
Glucose, UA: NEGATIVE
Hgb urine dipstick: NEGATIVE
Ketones, ur: NEGATIVE
Protein, ur: NEGATIVE
Urobilinogen, UA: 0.2

## 2010-11-20 LAB — BASIC METABOLIC PANEL
BUN: 8
BUN: 12
CO2: 25
Chloride: 109
Chloride: 109
Chloride: 107
Creatinine, Ser: 1.4
GFR calc Af Amer: >60
GFR calc non Af Amer: >60
GFR calc non Af Amer: 49 — ABNORMAL LOW
Glucose, Bld: 96
Glucose, Bld: 116 — ABNORMAL HIGH
Potassium: 4.1
Potassium: 3.8
Potassium: 4.2
Sodium: 137

## 2010-11-20 LAB — CBC
HCT: 36.9 — ABNORMAL LOW
HCT: 39.2
HCT: 39.2
Hemoglobin: 12.6 — ABNORMAL LOW
Hemoglobin: 13.5
Hemoglobin: 13.3
MCHC: 34.6
MCV: 92.7
MCV: 94.4
Platelets: 142 — ABNORMAL LOW
Platelets: 124 — ABNORMAL LOW
RBC: 3.94 — ABNORMAL LOW
RBC: 4.23
RDW: 13.8
WBC: 5.3
WBC: 6.1

## 2010-11-20 LAB — URINE CULTURE

## 2010-11-20 LAB — POCT CARDIAC MARKERS
CKMB, poc: <1 — ABNORMAL LOW
Myoglobin, poc: 93.1
Troponin i, poc: <0.05

## 2010-11-20 LAB — CARDIAC PANEL(CRET KIN+CKTOT+MB+TROPI)
CK, MB: 1.5
CK, MB: 2
CK, MB: 1.4
Relative Index: INVALID
Relative Index: INVALID
Total CK: 71
Total CK: 57
Troponin I: 0.01
Troponin I: 0.01
Troponin I: 0.02

## 2010-11-20 LAB — PROTIME-INR
INR: 3.9 — ABNORMAL HIGH
Prothrombin Time: 41.9 — ABNORMAL HIGH

## 2010-11-20 LAB — TSH: TSH: 1.616

## 2010-11-20 LAB — POCT I-STAT, CHEM 8
BUN: 14
Calcium, Ion: 1.14
Creatinine, Ser: 1.7 — ABNORMAL HIGH
Glucose, Bld: 135 — ABNORMAL HIGH
Hemoglobin: 14.3
TCO2: 24

## 2010-11-20 LAB — COMPREHENSIVE METABOLIC PANEL
AST: 17
Albumin: 3.6
Alkaline Phosphatase: 48
Chloride: 111
Creatinine, Ser: 1.39
GFR calc Af Amer: >60
Potassium: 4.2
Total Bilirubin: 1

## 2010-11-20 LAB — CK TOTAL AND CKMB (NOT AT ARMC): CK, MB: 2

## 2010-11-20 LAB — TROPONIN I: Troponin I: 0.01

## 2010-11-20 LAB — RPR: RPR Ser Ql: NONREACTIVE

## 2010-11-20 NOTE — Procedures (Signed)
EEG NUMBER:  REFERRING PHYSICIAN:  [facility]  HISTORY:  A 70 year old male with episodes of numbness.  MEDICATIONS:  Clonazepam, Crestor, furosemide, trazodone, carbidopa, pantoprazole, sertraline, promethazine, MiraLax, aspirin, tamsulosin.  CONDITIONS OF RECORDING:  This is a 16-channel EEG carried out with patient in the awake state.  DESCRIPTION:  The waking background activity consists of a low-voltage symmetrical fairly well-organized 8-9 Hz alpha activity seen from the parieto-occipital and posterotemporal regions.  Low-voltage fast activity poorly organized was seen anteriorly at times superimposed on more posterior rhythms.  A mixture of theta and alpha was seen from the central and temporal regions.  The patient does not drowse or sleep. Hypoventilation and intermittent photic stimulation were both performed. Hypoventilation only produced a mild build up.  No abnormalities were noted.  With intermittent photic stimulation, no change in the tracing was seen.  IMPRESSION:  This is a normal awake EEG.          ______________________________ Thana Farr, MD    JY:NWGN D:  11/19/2010 17:24:36  T:  11/19/2010 22:00:03  Job #:  [id number]

## 2010-11-20 NOTE — Consult Note (Signed)
NAMEJOHNELL, [name] NO.:  [id number]  MEDICAL RECORD NO.:  [id number]  LOCATION:  3041                         FACILITY:  [facility]  PHYSICIAN:  Thana Farr, MD    DATE OF BIRTH:  May 15, 1940  DATE OF CONSULTATION:  11/19/2010 DATE OF DISCHARGE:                                CONSULTATION   REASON FOR CONSULTATION:  Bilateral upper and lower extremity tingling that is intermittent and has been going on for the past 3 months.  HISTORY OF PRESENT ILLNESS:  This is a 70 year old male who presented to the emergency department on November 17, 2010, after wife called 911 secondary to an episode of bilateral upper and lower extremity numbness. These episodes have been present for the past 3-4 months.  The patient has been seen by Dr. Vickey Huger who had done a workup and per the patient has been negative up until this point.  During these episodes, the patient starts again neck stiffness, and gets tingling and numbness on one side of his body, which was then spread to the opposite side of the body, then to his groin and then throughout.  These episodes lasts for approximately 30 minutes after the patient has been given a Klonopin. The patient has been advised by Dr. Vickey Huger, the primary neurologist that when these episodes occurred that he take a 1 mg Klonopin.  Per the patient, he states that he believes he has been diagnosed with panic attacks; however, the wife is unsure.  DNA records are not available the chart.  PAST MEDICAL HISTORY: 1. Atrial fibrillation, rate controlled. 2. Syncope. 3. Parkinson disease. 4. Dementia. 5. Dysphasia. 6. CAD with mitral valve repair. 7. Hypothyroidism. 8. Benign prostatic hypertrophy. 9. Depression.  MEDICATIONS:  He is on amiodarone, aspirin, Cipro, carbidopa and levodopa, Klonopin, Lovenox, Lasix, Protonix, MiraLax, Crestor, Zoloft, Flomax, and trazodone.  ALLERGIES: 1. MOTRIN. 2. IBUPROFEN.  SOCIAL HISTORY:  The  patient does not smoke, drink, or do illicit drugs.  REVIEW OF SYSTEMS:  Negative with the exception above.  PHYSICAL EXAMINATION:  Blood pressure is 108/68, pulse 90, respirations 16, temperature 98.4.  The patient is alert and oriented, carries out two-step commands without any difficulty.  Pupils are equal and reactive to light and accommodating.  Conjugate gaze.  Extraocular movements are intact.  Visual fields grossly intact.  Face is symmetrical.  Tongue is midline.  Uvula is midline.  The patient does not show any dysarthria or aphasia.  Facial sensation is full.  Shoulder shrug and head turn are within normal limits.  Coordination; finger-to-nose and heel-to-shin are smooth.  Motor; shows 4+/5 throughout.  He does have a resting tremor in his left upper extremity and some intubation of his head at rest.  Deep tendon reflexes are 2+, downgoing toes bilaterally.  The patient has no drift in the upper and lower extremities.  Sensation is full to pinprick and light touch.  LABORATORY DATA:  PT is 16.0, INR is 1.25, B12 is 448.  TSH is 7.96, T3 is pending.  Folate is 906.  UA shows positive leukocyte.  Sodium 140, potassium 4.5, chloride 105, CO2 is 26, glucose 86, BUN 11, creatinine 1.33.  Triglycerides 60, cholesterol  121, HDL 46, LDL 61.  HbA1c is 5.3, D-dimer is 3.06.  CT angio of chest is pending.  MRI of brain shows chronic microvascular ischemic changes.  No infarct. CT of head is negative.  A 2-D echo shows left ventricular hypertrophy with ejection fraction of 60-65%.  ASSESSMENT AND PLAN:  This is a 70 year old male with symptoms of intermittent blurred vision, total body numbness, most consistent with panic attacks.  We will order a B12, folate, RPR, ESR, CRP and ANA, increase the patient's Zoloft to 75 mg daily and recommend continuing his Klonopin as directed by Dr. Vickey Huger.  I have explained to the patient and the patient's wife that the increase of Zoloft will not  take effect for approximately 2-3 weeks, therfore she is to continue giving the Klonopin as directed by Dr. Vickey Huger.  We also recommend the patient follow up with Dr. Vickey Huger in 2-3 weeks for further management.  I have seen and evaluated the patient with Dr. Thana Farr, she agrees with above.     Wayne Morn, PA-C   ______________________________ Thana Farr, MD    DS/MEDQ  D:  11/19/2010  T:  11/20/2010  Job:  [id number]  Electronically Signed by Wayne Morn PA-C on 11/20/2010 11:46:25 AM Electronically Signed by Thana Farr MD on 11/20/2010 02:36:14 PM

## 2010-11-27 LAB — COMPREHENSIVE METABOLIC PANEL
ALT: 48
BUN: 15
CO2: 26
Calcium: 8.4
Creatinine, Ser: 1.15
GFR calc non Af Amer: >60
Glucose, Bld: 108 — ABNORMAL HIGH
Sodium: 137

## 2010-11-27 LAB — PROTIME-INR
INR: 1.4
Prothrombin Time: 17.4 — ABNORMAL HIGH

## 2010-11-27 LAB — CBC
HCT: 34.3 — ABNORMAL LOW
Hemoglobin: 11.4 — ABNORMAL LOW
MCHC: 33.1
MCV: 94.8
RBC: 3.62 — ABNORMAL LOW
RDW: 13.3

## 2010-11-27 LAB — DIFFERENTIAL
Eosinophils Absolute: 0.8 — ABNORMAL HIGH
Lymphs Abs: 0.9
Neutro Abs: 6.7
Neutrophils Relative %: 74

## 2010-11-27 LAB — APTT: aPTT: 29

## 2010-11-30 NOTE — Discharge Summary (Signed)
[name], [name]              ACCOUNT NO.:  [id number]  MEDICAL RECORD NO.:  [id number]  LOCATION:  3041                         FACILITY:  [facility]  PHYSICIAN:  Richarda Overlie, MD       DATE OF BIRTH:  01/12/1941  DATE OF ADMISSION:  11/17/2010 DATE OF DISCHARGE:  11/19/2010                              DISCHARGE SUMMARY   PRIMARY CARE PHYSICIAN:  [name], MD.  PRIMARY NEUROLOGIST:  Melvyn Novas, MD.  DISCHARGE DIAGNOSES: 1. Numbness in bilateral upper and lower extremities of unclear     etiology, negative workup so far. 2. Parkinson's disease with dementia. 3. Dysphagia, status post modified barium swallow. 4. History of coronary artery disease and mitral valve repair. 5. Hypothyroidism. 6. Benign prostatic hypertrophy. 7. Depression. 8. Atrial fibrillation, currently rate controlled. 9. History of near syncopal episodes, likely secondary to Parkinson's     disease.  PROCEDURES: 1. EEG pending. 2. A 2-D echo shows mild LVH with an EF of 60%-65% and mild     regurgitation. 3. Chest x-ray shows extensive chronic changes throughout the lung     with rounded atelectasis.  CT of the head without contrast shows     apparent blurring of the gray-white differentiation, but anterior     aspect of the left frontal lobe possibly artifactual secondary to     beam hardening artifact. 4. MRI of the brain shows chronic microvascular ischemia without any     acute infarct and small skull meningioma. 5. CT angio of the chest and Doppler of bilateral lower extremities to     rule out a PE and DVT pending.  SUBJECTIVE:  This is a 70 year old male who presents to the ER with a chief complaint of bilateral upper and lower extremity numbness which he has had for the last 3-4 months.  The patient states that he has discussed this with Dr. Porfirio Mylar Dohmeier previously, but since he had two consecutive episodes on the 25th, he presented to the ER.  He also had some associated  shortness of breath.  He denied any unilateral weakness or slurred speech.  He also has a history of intermittent tinnitus and near-syncopal episodes which his cardiologist is attributed to his Parkinson's disease and autonomic dysfunction, but he did not describe any syncopal or near syncopal episodes during this admission.  He seems to think that some of his episode could be related to anxiety.  He has been having some progressive worsening dysphagia which is also attributed to his Parkinson's disease.  He denied any history of falls.  HOSPITAL COURSE: 1. Numbness of unclear etiology.  The patient was admitted for a full     TIA, CVA workup which was found to be negative and EEG is still     pending at this time.  B12 level was checked which was found to be     within normal limits.  His TSH was also checked and this was mildly     elevated at 7.296.  The patient did not have any neurologic changes     during the course of his hospital stay and EEG will also be done to     complete his  workup to rule out possible underlying seizures and  the results of which are still pending at this time. 2. History of Parkinson's disease with dementia followed by Dr. Porfirio Mylar     Dohmeier.  This appears to be progressive in nature and the patient     has been advised to follow up with Dr. Vickey Huger in about 1-2 weeks. 3. Dysphagia.  The patient had a modified barium swallow done as     recommended to be on chopped foods with no meats, thin liquids with     a straw, strict aspiration precautions at home. 4. History of atrial fibrillation.  This was found to be rate     controlled.  The patient was recently taken off his diltiazem.  PHYSICAL EXAMINATION:  VITAL SIGNS:  Blood pressure 111/62, respirations 18, pulse 93, temperature 98.7, 95% on room air. GENERAL:  Comfortable currently, no acute cardiopulmonary distress. HEENT:  Pupils are equal and reactive.  Extraocular movements intact. NECK:  Supple.   No JVD. LUNGS:  Clear to auscultation bilaterally.  No wheezes or crackles or rhonchi. CARDIOVASCULAR:  Regular rate and rhythm.  No murmurs, rubs, or gallops. ABDOMEN:  Soft, nontender, nondistended. EXTREMITIES:  Without cyanosis, clubbing or edema.  No gross focal weakness.  DISCHARGE MEDICATIONS: 1. Aspirin 81 mg p.o. daily. 2. Carbidopa and levodopa one tablet p.o. three times a day. 3. Klonopin 0.5 mg q.8 h. p.r.n. and one tablet at night. 4. Crestor 10 mg p.o. daily. 5. Lasix 20 mg daily. 6. MiraLax 17 grams p.o. daily. 7. Multivitamin one tablet p.o. daily. 8. Protonix 40 mg p.o. days. 9. Zoloft 50 mg p.o. daily. 10.Flomax 0.5 mg p.o. daily. 11.Trazodone 150 mg p.o. daily. 12.Levothyroxine 25 mcg p.o. daily.     Richarda Overlie, MD     NA/MEDQ  D:  11/19/2010  T:  11/19/2010  Job:  [id number]  cc:   Melvyn Novas, M.D. Tera Mater. [name], MD  Electronically Signed by Richarda Overlie MD on 11/30/2010 02:19:21 PM

## 2010-11-30 NOTE — Discharge Summary (Signed)
  NAMEFRANCK, [name]              ACCOUNT NO.:  [id number]  MEDICAL RECORD NO.:  [id number]  LOCATION:                                 FACILITY:  PHYSICIAN:  Richarda Overlie, MD       DATE OF BIRTH:  10/24/1940  DATE OF ADMISSION:  11/17/2010 DATE OF DISCHARGE:  11/20/2010                              DISCHARGE SUMMARY   ADDENDUM  Addendum to discharge summary dictated on November 19, 2010.  DISCHARGE DIAGNOSIS:  Remains unchanged.  1. Normal awake EEG. 2. V/Q scan shows normal V/Q scan without findings of PE. 3. Doppler ultrasound of bilateral lower extremities, does not show     any obvious DVT.  SUBJECTIVE:  The patient was evaluated by Dr. Thana Farr, Neurology, because of his numbness and tingling in bilateral upper and lower extremities.  Dr. Thad Ranger ordered extensive workup including vitamin B12, folate, RPR, ESR, CRP, and an ANA levels.  She also recommended increasing Zoloft to 75 mg p.o. daily.  She recommendedcontinuing the Klonopin as directed by Dr. Vickey Huger.  [name] wife and the patient were explained about the increase of the Zoloft and the fact that it can take effect in about 2-3 weeks.  The patient will follow up with Dr. Vickey Huger as recommended.  NEW DISCHARGE MEDICATION LIST: 1. Ciprofloxacin 1 tablet p.o. twice daily for 5 days. 2. Zoloft 75 mg p.o. daily. 3. Aspirin 81 mg p.o. daily. 4. Sinemet 25/100 one tablet p.o. three times a day. 5. Klonopin half to one tablet p.o. as needed. 6. Crestor 10 mg p.o. daily. 7. Lasix 20 mg p.o. daily. 8. MiraLax 17 g p.o. daily. 9. Multivitamin 1 tablet p.o. daily. 10.Protonix 40 mg p.o. twice daily. 11.Flomax 0.4 mg p.o. daily. 12.Trazodone 150 mg at bedtime.  PHYSICAL EXAMINATION:  VITAL SIGNS:  Blood pressure 107/70, pulse was 105, respirations 18, temperature 98.3, and 97% on room air. GENERAL:  Comfortable, in no acute cardiopulmonary distress. HEENT:  Pupils equal and reactive.  Extraocular  movements intact. NECK:  Supple.  No JVD. LUNGS:  Clear to auscultation bilaterally.  No wheezes or crackles or rhonchi. CARDIOVASCULAR:  Regular rate and rhythm.  No murmurs, rubs, or gallops. ABDOMEN:  Soft, nontender, nondistended. EXTREMITIES:  Without cyanosis, clubbing, or edema.  HOSPITAL COURSE:  Atrial fibrillation.  Please note that the patient was recently taken off diltiazem.  The patient follows up with Dr. Ladona Ridgel for this.  The patient did have one episode of atrial fibrillation with RVR with a rate up to the 120s but this was nonsustained; therefore, the diltiazem is not being resumed.  Followup appointments with PCP in 5-7 days.  Follow up with Dr. Vickey Huger in 1-2 weeks.     Richarda Overlie, MD     NA/MEDQ  D:  11/20/2010  T:  11/20/2010  Job:  [id number]  Electronically Signed by Richarda Overlie MD on 11/30/2010 02:20:03 PM

## 2010-12-16 ENCOUNTER — Telehealth: Payer: Self-pay | Admitting: Cardiology

## 2010-12-16 NOTE — Telephone Encounter (Signed)
Pt's wife wants to talk to you about activity and food he can have please call

## 2010-12-16 NOTE — Telephone Encounter (Signed)
Spoke with pt wife, questions regarding diet options answered. Going to be difficult due to pts recent stroke and dysphasia. Explained to wife at this point I would give him what he has a taste for and anything he is able to eat [name]

## 2010-12-21 ENCOUNTER — Other Ambulatory Visit: Payer: Self-pay | Admitting: Internal Medicine

## 2010-12-21 NOTE — Telephone Encounter (Signed)
Done

## 2010-12-28 ENCOUNTER — Other Ambulatory Visit: Payer: Self-pay | Admitting: Family Medicine

## 2010-12-30 NOTE — Telephone Encounter (Signed)
Script sent e-scribe 

## 2011-01-04 ENCOUNTER — Other Ambulatory Visit: Payer: Self-pay | Admitting: Family Medicine

## 2011-02-26 ENCOUNTER — Telehealth: Payer: Self-pay | Admitting: Cardiology

## 2011-02-26 NOTE — Telephone Encounter (Signed)
New msg Pt said crestor is too expensive please call back

## 2011-03-03 NOTE — Telephone Encounter (Signed)
pravachol 40 mg daily; lipids and liver in six weeks Olga Millers

## 2011-03-03 NOTE — Telephone Encounter (Signed)
Spoke with pt wife, they can not afford crestor and would need a generic statin. His crestor was 10 mg. Will forward for dr Jens Som review

## 2011-03-04 ENCOUNTER — Ambulatory Visit: Payer: Medicare Other | Admitting: Family Medicine

## 2011-03-05 ENCOUNTER — Telehealth: Payer: Self-pay | Admitting: *Deleted

## 2011-03-05 MED ORDER — CARBIDOPA-LEVODOPA 25-100 MG PO TABS: 1.0000 | ORAL_TABLET | Freq: Three times a day (TID) | ORAL | Status: DC

## 2011-03-05 NOTE — Telephone Encounter (Signed)
Refill on carbidopa-levo

## 2011-03-10 ENCOUNTER — Ambulatory Visit: Payer: Medicare Other | Admitting: Family Medicine

## 2011-03-11 MED ORDER — PRAVASTATIN SODIUM 40 MG PO TABS: 40.0000 mg | ORAL_TABLET | Freq: Every evening | ORAL | Status: DC

## 2011-03-11 NOTE — Telephone Encounter (Signed)
Spoke with pt wife, aware of new statin. Script mailed to pt for labs to be checked in [place] in 6 weeks.

## 2011-03-15 ENCOUNTER — Telehealth: Payer: Self-pay | Admitting: Cardiology

## 2011-03-15 NOTE — Telephone Encounter (Signed)
New problem [name] has Parkinson's and wanted to know if it was possible for someone to come to his home to draw his blood for lipid test. It is hard for family to get him out to have done at office Please call and let her know

## 2011-03-15 NOTE — Telephone Encounter (Signed)
Busy

## 2011-03-16 ENCOUNTER — Telehealth: Payer: Self-pay | Admitting: Cardiology

## 2011-03-16 NOTE — Telephone Encounter (Signed)
Pt parkasins is getting worse and he can't get around very well. They want to know if they can get a nurse to come in for drawing blood work and small things he is on [facility] with [employer] please call after 2pm

## 2011-03-16 NOTE — Telephone Encounter (Signed)
Informed care taker the only way i know of to have home lab draws are through home health and that is usually a limited time also, told her to call PCP or neurologist that takes care of parkinson's to assist with need for home health care, she agreed to plan.

## 2011-03-16 NOTE — Telephone Encounter (Signed)
Spoke with pt wife, she is having more difficulty taking care of [name]. She has spoken with dr Abran Cantor regarding home health before and we have referred her back to him to refer to home health.

## 2011-03-17 ENCOUNTER — Telehealth: Payer: Self-pay | Admitting: Family Medicine

## 2011-03-17 NOTE — Telephone Encounter (Signed)
Spoke with pt and he is now homebound. He does need his labs checked every 3 months and is requesting home health to do this.

## 2011-03-18 NOTE — Telephone Encounter (Signed)
I spoke with spouse and she has already talked with [facility]. It looks like another doctor has put the request in for home health, if she needs anything further from this office she will call back.

## 2011-03-18 NOTE — Telephone Encounter (Signed)
I am not aware of any reason he needs labs drawn this often. Please get more information from them

## 2011-03-22 ENCOUNTER — Encounter: Payer: Self-pay | Admitting: Cardiology

## 2011-03-23 ENCOUNTER — Telehealth: Payer: Self-pay | Admitting: Cardiology

## 2011-03-23 NOTE — Telephone Encounter (Signed)
New Msg: Pt calling wanting to speak with nurse/md regarding taking a less expensive substitute to crestor. Please return pt call to discuss further.

## 2011-03-23 NOTE — Telephone Encounter (Signed)
Spoke with pt wife, aware he can take pravastatin.

## 2011-03-26 ENCOUNTER — Telehealth: Payer: Self-pay | Admitting: Family Medicine

## 2011-03-26 NOTE — Telephone Encounter (Signed)
He sees a psychiatrist in [place] named Dr. Orpha Bur and he sees Dr. Vickey Huger for Neurology care. I think these folks would be better to contact about this

## 2011-03-26 NOTE — Telephone Encounter (Signed)
I tried to call pt, no answer or option to leave a message. I will try again later.

## 2011-03-26 NOTE — Telephone Encounter (Signed)
Pt's wife called and left a voice message. Around 3:30 in the afternoon, pt starts to get agitated and starts yelling. This has just started to occur and wife is concerned. A social worker was coming out to the house to visit yesterday afternoon. The wife did leave the same message with another doctor, she did want you to be aware of the problem and if you can offer any advise?

## 2011-03-30 ENCOUNTER — Telehealth: Payer: Self-pay | Admitting: Cardiology

## 2011-03-30 NOTE — Telephone Encounter (Signed)
New Msg: Pt wife calling wanting to speak with nurse regarding pt not wanting to come to pt appt in [place] with MD. Pt wife said this is the second time pt has refused to go to appt and didn't know if potentially nurse/MD could speak with pt.Pt wife said pt has been having episodes where he gets numb all over.  Please return pt wife call to discuss further.

## 2011-03-30 NOTE — Telephone Encounter (Signed)
Spoke with pt, he states he feels bad all over. He denies cp or sob. He agrees to schedule an appt for later in the month when he feels better. appt made, wife aware.

## 2011-03-31 ENCOUNTER — Ambulatory Visit: Payer: Medicare Other | Admitting: Cardiology

## 2011-04-02 ENCOUNTER — Telehealth: Payer: Self-pay | Admitting: *Deleted

## 2011-04-02 NOTE — Telephone Encounter (Signed)
Flomax is to relax his prostate so his bladder can empty, and lasix is to prevent fluid buildup in his body. I realized that this probably adds to his inciontinence but he needs it for his heart

## 2011-04-02 NOTE — Telephone Encounter (Signed)
Spoke with pt

## 2011-04-02 NOTE — Telephone Encounter (Signed)
Wife is calling because she would like to know why her husband is taking both lasix and Flomax? She states that the patient is having some incontinence.

## 2011-04-03 ENCOUNTER — Other Ambulatory Visit: Payer: Self-pay | Admitting: Family Medicine

## 2011-04-06 NOTE — Telephone Encounter (Signed)
Call in #[phone] with 11 rf 

## 2011-04-07 NOTE — Telephone Encounter (Signed)
done

## 2011-04-14 ENCOUNTER — Encounter (HOSPITAL_COMMUNITY): Payer: Self-pay | Admitting: Emergency Medicine

## 2011-04-14 ENCOUNTER — Inpatient Hospital Stay: Admission: AD | Admit: 2011-04-14 | Payer: Medicare Other | Source: Ambulatory Visit | Admitting: Cardiology

## 2011-04-14 ENCOUNTER — Encounter: Payer: Self-pay | Admitting: Cardiology

## 2011-04-14 ENCOUNTER — Ambulatory Visit (INDEPENDENT_AMBULATORY_CARE_PROVIDER_SITE_OTHER): Payer: Medicare Other | Admitting: Cardiology

## 2011-04-14 ENCOUNTER — Other Ambulatory Visit: Payer: Self-pay

## 2011-04-14 ENCOUNTER — Inpatient Hospital Stay (HOSPITAL_COMMUNITY)
Admission: EM | Admit: 2011-04-14 | Discharge: 2011-04-15 | DRG: 309 | Disposition: A | Discharge status: Home / Self Care | Payer: Medicare Other | Attending: Cardiology | Admitting: Cardiology

## 2011-04-14 ENCOUNTER — Emergency Department (HOSPITAL_COMMUNITY): Payer: Medicare Other

## 2011-04-14 DIAGNOSIS — K219 Gastro-esophageal reflux disease without esophagitis: Secondary | ICD-10-CM | POA: Diagnosis present

## 2011-04-14 DIAGNOSIS — Z86711 Personal history of pulmonary embolism: Secondary | ICD-10-CM

## 2011-04-14 DIAGNOSIS — G20A1 Parkinson's disease without dyskinesia, without mention of fluctuations: Secondary | ICD-10-CM | POA: Diagnosis present

## 2011-04-14 DIAGNOSIS — J45909 Unspecified asthma, uncomplicated: Secondary | ICD-10-CM | POA: Diagnosis present

## 2011-04-14 DIAGNOSIS — N400 Enlarged prostate without lower urinary tract symptoms: Secondary | ICD-10-CM | POA: Diagnosis present

## 2011-04-14 DIAGNOSIS — I951 Orthostatic hypotension: Secondary | ICD-10-CM

## 2011-04-14 DIAGNOSIS — E785 Hyperlipidemia, unspecified: Secondary | ICD-10-CM

## 2011-04-14 DIAGNOSIS — F068 Other specified mental disorders due to known physiological condition: Secondary | ICD-10-CM | POA: Insufficient documentation

## 2011-04-14 DIAGNOSIS — I4891 Unspecified atrial fibrillation: Principal | ICD-10-CM | POA: Diagnosis present

## 2011-04-14 DIAGNOSIS — Z79899 Other long term (current) drug therapy: Secondary | ICD-10-CM

## 2011-04-14 DIAGNOSIS — Z86718 Personal history of other venous thrombosis and embolism: Secondary | ICD-10-CM

## 2011-04-14 DIAGNOSIS — I2510 Atherosclerotic heart disease of native coronary artery without angina pectoris: Secondary | ICD-10-CM | POA: Diagnosis present

## 2011-04-14 DIAGNOSIS — F0390 Unspecified dementia without behavioral disturbance: Secondary | ICD-10-CM | POA: Diagnosis present

## 2011-04-14 DIAGNOSIS — F329 Major depressive disorder, single episode, unspecified: Secondary | ICD-10-CM | POA: Insufficient documentation

## 2011-04-14 DIAGNOSIS — R531 Weakness: Secondary | ICD-10-CM

## 2011-04-14 DIAGNOSIS — F3289 Other specified depressive episodes: Secondary | ICD-10-CM | POA: Diagnosis present

## 2011-04-14 DIAGNOSIS — Z9889 Other specified postprocedural states: Secondary | ICD-10-CM | POA: Insufficient documentation

## 2011-04-14 DIAGNOSIS — Z7982 Long term (current) use of aspirin: Secondary | ICD-10-CM

## 2011-04-14 DIAGNOSIS — D62 Acute posthemorrhagic anemia: Secondary | ICD-10-CM | POA: Diagnosis not present

## 2011-04-14 DIAGNOSIS — G20 Parkinson's disease: Secondary | ICD-10-CM | POA: Diagnosis present

## 2011-04-14 DIAGNOSIS — I10 Essential (primary) hypertension: Secondary | ICD-10-CM | POA: Diagnosis present

## 2011-04-14 LAB — CBC
HCT: 40 % (ref 39.0–52.0)
MCH: 24.6 pg — ABNORMAL LOW (ref 26.0–34.0)
MCHC: 30.3 g/dL (ref 30.0–36.0)
MCV: 81.3 fL (ref 78.0–100.0)
RDW: 16.9 % — ABNORMAL HIGH (ref 11.5–15.5)
WBC: 8.7 10*3/uL (ref 4.0–10.5)

## 2011-04-14 LAB — GLUCOSE, CAPILLARY: Glucose-Capillary: 122 mg/dL — ABNORMAL HIGH (ref 70–99)

## 2011-04-14 LAB — COMPREHENSIVE METABOLIC PANEL
AST: 14 U/L (ref 0–37)
CO2: 23 mEq/L (ref 19–32)
Calcium: 9.6 mg/dL (ref 8.4–10.5)
Creatinine, Ser: 1.27 mg/dL (ref 0.50–1.35)
GFR calc Af Amer: 65 mL/min — ABNORMAL LOW (ref >90)
GFR calc non Af Amer: 56 mL/min — ABNORMAL LOW (ref >90)

## 2011-04-14 LAB — MRSA PCR SCREENING: MRSA by PCR: NEGATIVE

## 2011-04-14 LAB — DIFFERENTIAL
Basophils Absolute: 0.1 10*3/uL (ref 0.0–0.1)
Eosinophils Relative: 3 % (ref 0–5)
Lymphocytes Relative: 18 % (ref 12–46)
Monocytes Absolute: 0.8 10*3/uL (ref 0.1–1.0)

## 2011-04-14 LAB — CARDIAC PANEL(CRET KIN+CKTOT+MB+TROPI)
Relative Index: INVALID (ref 0.0–2.5)
Total CK: 67 U/L (ref 7–232)
Troponin I: <0.3 ng/mL (ref <0.30)

## 2011-04-14 LAB — URINALYSIS, ROUTINE W REFLEX MICROSCOPIC
Hgb urine dipstick: NEGATIVE
Leukocytes, UA: NEGATIVE
Nitrite: NEGATIVE
Specific Gravity, Urine: 1.015 (ref 1.005–1.030)
Urobilinogen, UA: 1 mg/dL (ref 0.0–1.0)

## 2011-04-14 LAB — APTT: aPTT: 32 seconds (ref 24–37)

## 2011-04-14 LAB — TSH: TSH: 2.604 u[IU]/mL (ref 0.350–4.500)

## 2011-04-14 LAB — MAGNESIUM: Magnesium: 2.3 mg/dL (ref 1.5–2.5)

## 2011-04-14 MED ORDER — SODIUM CHLORIDE 0.9 % IJ SOLN: 3.0000 mL | INTRAMUSCULAR | Status: DC | PRN

## 2011-04-14 MED ORDER — ASPIRIN EC 81 MG PO TBEC
81.0000 mg | DELAYED_RELEASE_TABLET | Freq: Every day | ORAL | Status: DC
  Administered 2011-04-15: 81 mg via ORAL
  Filled 2011-04-14: qty 1

## 2011-04-14 MED ORDER — AMIODARONE HCL IN DEXTROSE 360-4.14 MG/200ML-% IV SOLN
1.0000 mg/min | INTRAVENOUS | Status: AC
  Administered 2011-04-15: 1 mg/min via INTRAVENOUS
  Filled 2011-04-14: qty 200

## 2011-04-14 MED ORDER — SODIUM CHLORIDE 0.9 % IJ SOLN
3.0000 mL | Freq: Two times a day (BID) | INTRAMUSCULAR | Status: DC
  Administered 2011-04-14 – 2011-04-15 (×2): 3 mL via INTRAVENOUS

## 2011-04-14 MED ORDER — PANTOPRAZOLE SODIUM 40 MG PO TBEC
40.0000 mg | DELAYED_RELEASE_TABLET | Freq: Two times a day (BID) | ORAL | Status: DC
  Administered 2011-04-15 (×2): 40 mg via ORAL
  Filled 2011-04-14 (×2): qty 1

## 2011-04-14 MED ORDER — CARBIDOPA-LEVODOPA 25-100 MG PO TABS
1.0000 | ORAL_TABLET | Freq: Two times a day (BID) | ORAL | Status: DC
  Administered 2011-04-15 (×2): 1 via ORAL
  Filled 2011-04-14 (×3): qty 1

## 2011-04-14 MED ORDER — DILTIAZEM HCL 100 MG IV SOLR
5.0000 mg/h | INTRAVENOUS | Status: DC
  Filled 2011-04-14: qty 100

## 2011-04-14 MED ORDER — DILTIAZEM HCL 25 MG/5ML IV SOLN
10.0000 mg | Freq: Once | INTRAVENOUS | Status: AC
  Administered 2011-04-14: 10 mg via INTRAVENOUS

## 2011-04-14 MED ORDER — TAMSULOSIN HCL 0.4 MG PO CAPS
0.4000 mg | ORAL_CAPSULE | Freq: Every day | ORAL | Status: DC
  Administered 2011-04-15: 0.4 mg via ORAL
  Filled 2011-04-14 (×2): qty 1

## 2011-04-14 MED ORDER — PROMETHAZINE HCL 25 MG PO TABS: 25.0000 mg | ORAL_TABLET | Freq: Four times a day (QID) | ORAL | Status: DC | PRN

## 2011-04-14 MED ORDER — ROSUVASTATIN CALCIUM 10 MG PO TABS
10.0000 mg | ORAL_TABLET | Freq: Every day | ORAL | Status: DC
  Filled 2011-04-14: qty 1

## 2011-04-14 MED ORDER — WARFARIN SODIUM 7.5 MG PO TABS
7.5000 mg | ORAL_TABLET | ORAL | Status: AC
  Administered 2011-04-14: 7.5 mg via ORAL
  Filled 2011-04-14: qty 1

## 2011-04-14 MED ORDER — ACETAMINOPHEN 325 MG PO TABS: 650.0000 mg | ORAL_TABLET | ORAL | Status: DC | PRN

## 2011-04-14 MED ORDER — HEPARIN BOLUS VIA INFUSION
5000.0000 [IU] | Freq: Once | INTRAVENOUS | Status: AC
  Administered 2011-04-14: 5000 [IU] via INTRAVENOUS

## 2011-04-14 MED ORDER — TRAZODONE HCL 150 MG PO TABS
150.0000 mg | ORAL_TABLET | Freq: Every day | ORAL | Status: DC
  Filled 2011-04-14: qty 1

## 2011-04-14 MED ORDER — HEPARIN SOD (PORCINE) IN D5W 100 UNIT/ML IV SOLN
1600.0000 [IU]/h | INTRAVENOUS | Status: DC
  Administered 2011-04-14 – 2011-04-15 (×2): 1600 [IU]/h via INTRAVENOUS
  Filled 2011-04-14 (×2): qty 250

## 2011-04-14 MED ORDER — ADULT MULTIVITAMIN W/MINERALS CH
1.0000 | ORAL_TABLET | Freq: Every day | ORAL | Status: DC
  Administered 2011-04-15: 1 via ORAL
  Filled 2011-04-14: qty 1

## 2011-04-14 MED ORDER — AMIODARONE HCL IN DEXTROSE 360-4.14 MG/200ML-% IV SOLN
0.5000 mg/min | INTRAVENOUS | Status: DC
  Filled 2011-04-14 (×3): qty 200

## 2011-04-14 MED ORDER — ONDANSETRON HCL 4 MG/2ML IJ SOLN: 4.0000 mg | Freq: Four times a day (QID) | INTRAMUSCULAR | Status: DC | PRN

## 2011-04-14 MED ORDER — SERTRALINE HCL 100 MG PO TABS
100.0000 mg | ORAL_TABLET | Freq: Every evening | ORAL | Status: DC
  Filled 2011-04-14: qty 1

## 2011-04-14 MED ORDER — SODIUM CHLORIDE 0.9 % IV SOLN: 250.0000 mL | INTRAVENOUS | Status: DC | PRN

## 2011-04-14 MED ORDER — AMIODARONE LOAD VIA INFUSION
150.0000 mg | Freq: Once | INTRAVENOUS | Status: AC
  Administered 2011-04-15: 150 mg via INTRAVENOUS
  Filled 2011-04-14: qty 83.34

## 2011-04-14 MED ORDER — CLONAZEPAM 0.5 MG PO TABS: 0.2500 mg | ORAL_TABLET | Freq: Four times a day (QID) | ORAL | Status: DC | PRN

## 2011-04-14 MED ORDER — NITROGLYCERIN 0.4 MG SL SUBL: 0.4000 mg | SUBLINGUAL_TABLET | SUBLINGUAL | Status: DC | PRN

## 2011-04-14 MED ORDER — DILTIAZEM HCL 25 MG/5ML IV SOLN
INTRAVENOUS | Status: AC
  Administered 2011-04-14: 10 mg via INTRAVENOUS
  Filled 2011-04-14: qty 5

## 2011-04-14 MED ORDER — POLYETHYLENE GLYCOL 3350 17 G PO PACK
17.0000 g | PACK | Freq: Every day | ORAL | Status: DC | PRN
  Filled 2011-04-14: qty 1

## 2011-04-14 NOTE — Progress Notes (Signed)
Patient Name: [name] Date of Encounter: 04/14/2011     Principal Problem:  *Atrial fibrillation Active Problems:  DEMENTIA  DEPRESSION  HYPERTENSION  CAD  Orthostatic hypotension  GERD  MITRAL VALVE REPLACEMENT, HX OF  HYPERLIPIDEMIA    SUBJECTIVE  71 y/o male seen in office today by Dr. Jens Som with complaints of fatigue and DOE and found to be in rapid a.fib.  Please see Dr. Ludwig Clarks clinic note for complete details of the pts history and physical.  Pt was sent to ER for admission.  He remains in a.fib but is currently asymptomatic and hemodynamically stable.  CURRENT MEDS Prior to Admission medications   Medication Sig Start Date End Date Taking? Authorizing Provider  aspirin EC 81 MG tablet Take 81 mg by mouth daily.   Yes Historical Provider, MD  carbidopa-levodopa (SINEMET) 25-100 MG per tablet Take 1 tablet by mouth 2 (two) times daily. 1 tablet at breakfast and lunch 03/05/11  Yes Nelwyn Salisbury, MD  chlorproMAZINE (THORAZINE) 25 MG tablet Take 50 mg by mouth daily as needed. For hiccups   Yes Historical Provider, MD  clonazePAM (KLONOPIN) 0.5 MG tablet Take 0.25 mg by mouth 4 (four) times daily as needed. For spasms; at meals and bedtime 09/29/10  Yes Nelwyn Salisbury, MD  Multiple Vitamin (MULITIVITAMIN WITH MINERALS) TABS Take 1 tablet by mouth daily.   Yes Historical Provider, MD  pantoprazole (PROTONIX) 40 MG tablet Take 40 mg by mouth 2 (two) times daily.  06/09/10 06/09/11 Yes Nelwyn Salisbury, MD  polyethylene glycol [facility] / GLYCOLAX) packet Take 17 g by mouth daily as needed. For constipation    Yes Historical Provider, MD  pravastatin (PRAVACHOL) 40 MG tablet Take 40 mg by mouth every evening. 03/11/11 03/10/12 Yes Lewayne Bunting, MD  promethazine (PHENERGAN) 25 MG tablet Take 25 mg by mouth every 6 (six) hours as needed. For nausea   Yes Historical Provider, MD  sertraline (ZOLOFT) 100 MG tablet Take 100 mg by mouth every evening.    Yes Historical  Provider, MD  Tamsulosin HCl (FLOMAX) 0.4 MG CAPS Take 0.4 mg by mouth daily after breakfast.   Yes Historical Provider, MD  traZODone (DESYREL) 150 MG tablet Take 150 mg by mouth at bedtime.   Yes Historical Provider, MD    OBJECTIVE  Filed Vitals:   04/14/11 1645 04/14/11 1700 04/14/11 1700 04/14/11 1710  BP:  108/76 108/76 110/83  Pulse: 113 67    Temp:      TempSrc:      Resp: 21 20    SpO2: 97% 95%      PHYSICAL EXAM  General: Pleasant, NAD. Neuro: Alert and oriented X 3. Moves all extremities spontaneously. Psych: Normal affect. HEENT:  Normal  Neck: Supple without bruits or JVD. Lungs:  Resp regular and unlabored, CTA. Heart: IR, IR, tachy. No s3, s4.  2/6 SM throughout, loudest @ apex. Abdomen: Soft, non-tender, non-distended, BS + x 4.  Extremities: No clubbing, cyanosis or edema. DP/PT/Radials 1+ and equal bilaterally.  Accessory Clinical Findings  CBC  Basename 04/14/11 1651  WBC 8.7  NEUTROABS 5.9  HGB 12.1*  HCT 40.0  MCV 81.3  PLT 231   Bmet, tsh, CE pending TELE  A.fib 90's- 120's  ECG  A.fib, 145, RAD, old inf infarct.  Nonspecific st/t changes  Radiology/Studies  No results found.  ASSESSMENT AND PLAN  1.  AFib and RVR:  Refer to Dr. Waunita Schooner office note/H&P from today for complete  plan.  Pt stable currently.  Adding IV amio/heparin & coumadin.  Avoid bb/ccb 2/2 profound orthostatic hypotn.  May need tee and dccv during this admission if unable to rate control/convert on amio.  2.  S/P MVR: systolic murmur noted.  Echo ordered.  Signed, Nicolasa Ducking NP

## 2011-04-14 NOTE — ED Notes (Signed)
Pt given warm pack for IV site.

## 2011-04-14 NOTE — Assessment & Plan Note (Signed)
Continue aspirin and statin. 

## 2011-04-14 NOTE — Assessment & Plan Note (Signed)
Watch blood pressure

## 2011-04-14 NOTE — Progress Notes (Signed)
ANTICOAGULATION CONSULT NOTE - Initial Consult  Pharmacy Consult for Heparin/coumadin Indication: Afib  Allergies  Allergen Reactions  . Ibuprofen     Stomach hurts    Patient Measurements:   Heparin Dosing Weight: 101kg  Vital Signs: Temp: 98.3 F (36.8 C) (02/20 1637) Temp src: Oral (02/20 1637) BP: 110/83 mmHg (02/20 1710) Pulse Rate: 67  (02/20 1700)  Labs:  Basename 04/14/11 1651  HGB 12.1*  HCT 40.0  PLT 231  APTT --  LABPROT --  INR --  HEPARINUNFRC --  CREATININE --  CKTOTAL --  CKMB --  TROPONINI --   The CrCl is unknown because both a height and weight (above a minimum accepted value) are required for this calculation.  Medical History: Past Medical History  Diagnosis Date  . Atrial fibrillation   . PE (pulmonary embolism)   . DVT (deep venous thrombosis)   . Meningioma   . BPH (benign prostatic hyperplasia)   . Colon polyps   . Venereal wart   . Depression   . Asthma   . Chickenpox   . Thrombophlebitis   . GERD (gastroesophageal reflux disease)   . Parkinson disease   . Dementia   . CAD (coronary artery disease)   . Hyperlipidemia     Medications:   Assessment: 70 YOM sent to ED from physician's office with AFib/RVR.  Orders to start heparin and coumadin per pharmacy.  He was previously on coumadin in '11 following CABG and AVR but no longer taking. Amiodarone is being started and will need to watch INR trend closely d/t interaction with coumadin.  Avoiding BB and CCB d/t orthostatic hypotension.  Baseline CBC OK.   Goal of Therapy:  INR 2-3 Heparin level 0.3-0.7 units/ml   Plan:  1. Heparin 5000 unit bolus then 1600 unit/hr infusion (start once baseline INR/aPTTdrawn) 2. Coumadin 7.5mg  x1 tonight 3. Check heparin level in 8h 4. Daily heparin level, CBC, and INR  Suzette Battiest, Tad Moore 04/14/2011,5:25 PM

## 2011-04-14 NOTE — ED Notes (Signed)
Pt sent from [facility] with tachycardia; pt sts SOB x 3-4 days; pt with hx of CABG; pt denies palpitations; pt sts hx of same

## 2011-04-14 NOTE — ED Notes (Signed)
Pt given urinal and asked to try and give a sample as soon as able.  Bed rail left down so pt can use urinal, family members in room to assist.

## 2011-04-14 NOTE — Assessment & Plan Note (Signed)
Continue statin. 

## 2011-04-14 NOTE — ED Notes (Addendum)
Cardiologist NP with [facility] in to speak with patient.

## 2011-04-14 NOTE — ED Provider Notes (Signed)
History     CSN: [id number]  Arrival date & time 04/14/11  1625   First MD Initiated Contact with Patient 04/14/11 1644      Chief Complaint  Patient presents with  . Tachycardia    (Consider location/radiation/quality/duration/timing/severity/associated sxs/prior treatment) HPI History provided by the patient and the patient's wife.  History 71 year old male with a history of Parkinson's, mild dementia, A. fib on aspirin only, and PE presenting sent by cardiology secondary to A. Fib.  Patient reportedly has had about 2-3 days of generalized weakness, fatigue, and poor by mouth intake described as constant, moderate to severe, progressive, without similar symptoms in the past.. Patient was seen this morning by Dr. Jens Som cardiology for his six-month followup and found to have A. fib with RVR of unknown rate. This, patient was sent for further evaluation. Patient denies associated chest pain, shortness of breath, palpitations, or fever. No recent illness. Patient is not on any medication for rate control. Patient has had occasional hand numbness or panic attacks similar to prior over the past few days but intermittent.     Past Medical History  Diagnosis Date  . Atrial fibrillation   . PE (pulmonary embolism)   . DVT (deep venous thrombosis)   . Meningioma   . BPH (benign prostatic hyperplasia)   . Colon polyps   . Venereal wart   . Depression   . Asthma   . Chickenpox   . Thrombophlebitis   . GERD (gastroesophageal reflux disease)   . Parkinson disease   . Dementia   . CAD (coronary artery disease)   . Hyperlipidemia     Past Surgical History  Procedure Date  . Hernia repair   . Tonsilectomy, adenoidectomy, bilateral myringotomy and tubes   . Varicose vein surgery     left leg  . Back surgery     herniated disc & remove bone spurs  . Coronary artery bypass graft     Family History  Problem Relation Age of Onset  . Arthritis    . Diabetes    . Stroke    .  Heart disease Mother     History  Substance Use Topics  . Smoking status: Former Games developer  . Smokeless tobacco: Never Used  . Alcohol Use: No      Review of Systems  Constitutional: Positive for fatigue. Negative for fever and chills.  HENT: Negative for congestion, sore throat and rhinorrhea.   Eyes: Negative for pain and visual disturbance.  Respiratory: Negative for cough and shortness of breath.   Cardiovascular: Negative for chest pain, palpitations and leg swelling.  Gastrointestinal: Positive for nausea. Negative for vomiting, abdominal pain and diarrhea.  Genitourinary: Positive for frequency (leading to stopping lasix about 2 days ago). Negative for dysuria and hematuria.  Musculoskeletal: Negative for back pain and gait problem.  Skin: Negative for rash and wound.  Neurological: Positive for weakness (generalized). Negative for dizziness and headaches.  Psychiatric/Behavioral: Negative for confusion and agitation.  All other systems reviewed and are negative.    Allergies  Ibuprofen  Home Medications   Current Outpatient Rx  Name Route Sig Dispense Refill  . ASPIRIN 81 MG PO TABS Oral Take 81 mg by mouth daily.      Marland Kitchen CARBIDOPA-LEVODOPA 25-100 MG PO TABS Oral Take 1 tablet by mouth 3 (three) times daily. 90 tablet 0  . CHLORPROMAZINE HCL 25 MG PO TABS  TAKE 2 TABLETS DAILY AS NEEDED FOR HICCUPS 60 tablet 6  . CLONAZEPAM 0.5  MG PO TABS Oral Take 1 tablet (0.5 mg total) by mouth 3 (three) times daily with meals. Take 1/2 tab at each meal and 1 tab at bedtime. 90 tablet 5  . FLOMAX 0.4 MG PO CAPS  TAKE (1) CAPSULE DAILY. 30 capsule 5  . ONE-DAILY MULTI VITAMINS PO TABS Oral Take 1 tablet by mouth daily.      Marland Kitchen PANTOPRAZOLE SODIUM 40 MG PO TBEC Oral Take 40 mg by mouth 2 (two) times daily.      Marland Kitchen POLYETHYLENE GLYCOL 3350 PO PACK Oral Take 17 g by mouth daily.      Marland Kitchen PRAVASTATIN SODIUM 40 MG PO TABS Oral Take 1 tablet (40 mg total) by mouth every evening. 30 tablet 11    . PROMETHAZINE HCL 25 MG PO TABS  USE AS DIRECTED 60 tablet 11  . SERTRALINE HCL 100 MG PO TABS Oral Take 100 mg by mouth daily.    . TRAZODONE HCL 150 MG PO TABS  TAKE 1 TABLET AT BEDTIME. 30 tablet 11    BP 95/75  Pulse 134  Temp(Src) 98.3 F (36.8 C) (Oral)  Resp 14  SpO2 98%  Physical Exam  Nursing note and vitals reviewed. Constitutional: He is oriented to person, place, and time. No distress.       Mildly obese, oriented to person place and time, daughter and wife at bedside  HENT:  Head: Normocephalic and atraumatic.  Right Ear: External ear normal.  Left Ear: External ear normal.  Nose: Nose normal.  Mouth/Throat: Oropharynx is clear and moist.  Eyes: Conjunctivae and EOM are normal. Pupils are equal, round, and reactive to light.  Neck: Normal range of motion. Neck supple.  Cardiovascular: Intact distal pulses.  An irregularly irregular rhythm present. Tachycardia present.   Murmur (prominent murmur heard best at Lt sternal border and apex) heard. Pulmonary/Chest: Effort normal and breath sounds normal. No respiratory distress.  Abdominal: Soft. Bowel sounds are normal. He exhibits no distension. There is no tenderness.       obese  Musculoskeletal: Normal range of motion. He exhibits no edema and no tenderness.  Neurological: He is alert and oriented to person, place, and time.  Skin: Skin is dry. No rash noted. He is not diaphoretic.  Psychiatric: He has a normal mood and affect. Judgment normal.    ED Course  Procedures (including critical care time)   Date: 04/14/2011  Rate: 134  Rhythm: a.fib with RVR  QRS Axis: normal  Intervals:  QTc WNL at 480  ST/T Wave abnormalities: normal other than TWI in aVF new from prior on 11/17/10 Poor R progression, Q waves in III; no Sn of acute ischemia.    Labs Reviewed  CBC - Abnormal; Notable for the following:    Hemoglobin 12.1 (*)    MCH 24.6 (*)    RDW 16.9 (*)    All other components within normal limits   COMPREHENSIVE METABOLIC PANEL - Abnormal; Notable for the following:    GFR calc non Af Amer 56 (*)    GFR calc Af Amer 65 (*)    All other components within normal limits  GLUCOSE, CAPILLARY - Abnormal; Notable for the following:    Glucose-Capillary 122 (*)    All other components within normal limits  PROTIME-INR - Abnormal; Notable for the following:    Prothrombin Time 15.3 (*)    All other components within normal limits  DIFFERENTIAL  POCT I-STAT TROPONIN I  APTT  URINALYSIS, ROUTINE W REFLEX  MICROSCOPIC  TSH   Dg Chest Port 1 View  04/14/2011  *RADIOLOGY REPORT*  Clinical Data: Atrial fibrillation with rapid ventricular response. History of bypass.  Nonsmoker.History pulmonary embolism. Demented.  PORTABLE CHEST - 1 VIEW  Comparison: 11/19/2010  Findings: Prior median sternotomy.  Prior valve repair.  Numerous leads and wires project over the chest.  Patient rotated right.  Moderate cardiomegaly.  Bilateral pleural thickening. Possible trace left pleural fluid. No pneumothorax.  Worsened right greater than left bibasilar aeration.  IMPRESSION:  1.  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  Especially at the left lung base, superimposed acute atelectasis or infection cannot be excluded.  Consider short-term PA and lateral radiographic follow-up. 2. Cardiomegaly without congestive failure.  Original Report Authenticated By: Consuello Bossier, M.D.     1. Atrial fibrillation with rapid ventricular response   2. Weakness generalized       MDM  71 year old male with history of A. fib sent from cardiology office secondary to a failed with RVR. Patient without chest pain, shortness of breath, or palpitations but reports days of weakness fatigue and poor PO intake.  Exam as above, heart rate between 130s and 140s on a monitor, normal mentation. Systolic blood pressure between 102 to 110.  Unknown duration of tachycardia; pt not on anticoagulation; no  indication for cardioversion.  EKG was A. fib without acute ischemia.  Labs was initially negative troponin, creatinine 1.27, INR 1.18 not on Coumadin, normal TSH, and UA without sign of UTI.  Diltiazem bolus of 10mg  IV given with improved rate to 95-108; gtt started at a rate of 5.    Cardiology saw the patient soon after arrival and will admit.  Rec d/c of dilt with plan for mgmt with amio.  No acute issues reported prior to pt admission.      Particia Lather, MD 04/15/11 [phone]

## 2011-04-14 NOTE — Assessment & Plan Note (Signed)
Mitral regurgitation murmur on examination. Check echocardiogram.

## 2011-04-14 NOTE — Progress Notes (Signed)
HPI: Pleasant male with past medical history of coronary artery disease and atrial fibrillation for followup. Patient admitted in July of 2011 with chest pain. Cardiac catheterization revealed an ejection fraction of 55-60% and significant mitral regurgitation. The right coronary artery had no significant stenosis. There was a 40% circumflex. The proximal portion of the left anterior descending artery has about 90% narrowing. Echocardiogram showed normal LV function and severe mitral regurgitation. On July 12 of 2011 the patient underwent two-vessel coronary artery bypass grafting (left internal mammary artery to LAD, saphenous vein graft to obtuse marginal) and MV repair with a 22-mm Edwards ring and oversewing of left atrial appendage. Echo in Oct 2011 revealed mild systolic dysfunction, EF 45-50%; s/p mitral valve repair. There was no significant stenosis. There was a very eccentric jet of mitral regurgitation that was poorly characterized. It appeared mild but given a very complete CW doppler jet it could be more moderate. Normal RV size with mild systolic dysfunction. Mild pulmonary hypertension. Patient also with orthostatic hypotension. Since I last saw him in August 2012, he continues to have dyspnea on exertion which is unchanged. There is no orthopnea, PND, pedal edema, palpitations, syncope or exertional chest pain. He does have fatigue but unchanged compared to previous.   Current Outpatient Prescriptions  Medication Sig Dispense Refill  . aspirin 81 MG tablet Take 81 mg by mouth daily.        . carbidopa-levodopa (SINEMET) 25-100 MG per tablet Take 1 tablet by mouth 3 (three) times daily.  90 tablet  0  . chlorproMAZINE (THORAZINE) 25 MG tablet TAKE 2 TABLETS DAILY AS NEEDED FOR HICCUPS  60 tablet  6  . clonazePAM (KLONOPIN) 0.5 MG tablet Take 1 tablet (0.5 mg total) by mouth 3 (three) times daily with meals. Take 1/2 tab at each meal and 1 tab at bedtime.  90 tablet  5  . FLOMAX 0.4 MG  CAPS TAKE (1) CAPSULE DAILY.  30 capsule  5  . Multiple Vitamin (MULTIVITAMIN) tablet Take 1 tablet by mouth daily.        . pantoprazole (PROTONIX) 40 MG tablet Take 40 mg by mouth 2 (two) times daily.        . polyethylene glycol (MIRALAX / GLYCOLAX) packet Take 17 g by mouth daily.        . pravastatin (PRAVACHOL) 40 MG tablet Take 1 tablet (40 mg total) by mouth every evening.  30 tablet  11  . promethazine (PHENERGAN) 25 MG tablet USE AS DIRECTED  60 tablet  11  . sertraline (ZOLOFT) 100 MG tablet Take 100 mg by mouth daily.      . traZODone (DESYREL) 150 MG tablet TAKE 1 TABLET AT BEDTIME.  30 tablet  11     Past Medical History  Diagnosis Date  . Atrial fibrillation   . PE (pulmonary embolism)   . DVT (deep venous thrombosis)   . Meningioma   . BPH (benign prostatic hyperplasia)   . Colon polyps   . Venereal wart   . Depression   . Asthma   . Chickenpox   . Thrombophlebitis   . GERD (gastroesophageal reflux disease)   . Parkinson disease   . Dementia   . CAD (coronary artery disease)   . Hyperlipidemia     Past Surgical History  Procedure Date  . Hernia repair   . Tonsilectomy, adenoidectomy, bilateral myringotomy and tubes   . Varicose vein surgery     left leg  . Back surgery  herniated disc & remove bone spurs  . Coronary artery bypass graft     History   Social History  . Marital Status: Married    Spouse Name: N/A    Number of Children: 2  . Years of Education: N/A   Occupational History  .      Retired   Social History Main Topics  . Smoking status: Former Games developer  . Smokeless tobacco: Never Used  . Alcohol Use: No  . Drug Use: No  . Sexually Active: Not on file   Other Topics Concern  . Not on file   Social History Narrative  . No narrative on file    ROS: Fatigue but no fevers or chills, productive cough, hemoptysis, dysphasia, odynophagia, melena, hematochezia, dysuria, hematuria, rash, seizure activity, orthopnea, PND, pedal  edema, claudication. Remaining systems are negative.  Physical Exam: Well-developed well-nourished in no acute distress.  Skin is warm and dry.  HEENT is normal.  Neck is supple. No thyromegaly.  Chest is clear to auscultation with normal expansion.  Cardiovascular exam is tachycardic and irregular. 2/6 systolic murmur apex. Abdominal exam nontender or distended. No masses palpated. Extremities show no edema. Neuro with signs of Parkinson's. Resting tremor.  ECG atrial fibrillation at a rate of 145. Right axis deviation. RV conduction delay. Occasional PVCs are barely conducted beats. Prior inferior infarct. Nonspecific ST changes.

## 2011-04-14 NOTE — Assessment & Plan Note (Signed)
Patient has developed recurrent atrial fibrillation. His heart rate is elevated at 145. I will admit to telemetry. He does have a history of orthostatic hypotension. I am therefore hesitant to add either metoprolol or Cardizem. I will add IV amiodarone for rate control. Begin heparin and Coumadin. If rate is difficult to control he may require TEE guided cardioversion. Continue Coumadin for 4 weeks after sinus established. I do not think he is a good long-term candidate for Coumadin given his history of orthostasis and previous falls. He states he has not fallen recently. Check TSH and echocardiogram.

## 2011-04-15 DIAGNOSIS — I059 Rheumatic mitral valve disease, unspecified: Secondary | ICD-10-CM

## 2011-04-15 DIAGNOSIS — I4891 Unspecified atrial fibrillation: Principal | ICD-10-CM

## 2011-04-15 LAB — CBC
HCT: 32.6 % — ABNORMAL LOW (ref 39.0–52.0)
HCT: 33.4 % — ABNORMAL LOW (ref 39.0–52.0)
Hemoglobin: 10.1 g/dL — ABNORMAL LOW (ref 13.0–17.0)
Hemoglobin: 10.4 g/dL — ABNORMAL LOW (ref 13.0–17.0)
MCH: 24.7 pg — ABNORMAL LOW (ref 26.0–34.0)
MCH: 24.8 pg — ABNORMAL LOW (ref 26.0–34.0)
MCHC: 31 g/dL (ref 30.0–36.0)
MCHC: 31.1 g/dL (ref 30.0–36.0)
MCV: 79.7 fL (ref 78.0–100.0)
MCV: 79.7 fL (ref 78.0–100.0)
Platelets: 207 10*3/uL (ref 150–400)
Platelets: 237 10*3/uL (ref 150–400)
RBC: 4.09 MIL/uL — ABNORMAL LOW (ref 4.22–5.81)
RBC: 4.19 MIL/uL — ABNORMAL LOW (ref 4.22–5.81)
RDW: 16.8 % — ABNORMAL HIGH (ref 11.5–15.5)
RDW: 16.9 % — ABNORMAL HIGH (ref 11.5–15.5)
WBC: 6.3 10*3/uL (ref 4.0–10.5)
WBC: 6.2 10*3/uL (ref 4.0–10.5)

## 2011-04-15 LAB — BASIC METABOLIC PANEL
BUN: 10 mg/dL (ref 6–23)
CO2: 26 mEq/L (ref 19–32)
Calcium: 9.3 mg/dL (ref 8.4–10.5)
Chloride: 105 mEq/L (ref 96–112)
Creatinine, Ser: 1.21 mg/dL (ref 0.50–1.35)
GFR calc Af Amer: 69 mL/min — ABNORMAL LOW (ref >90)
GFR calc non Af Amer: 59 mL/min — ABNORMAL LOW (ref >90)
Glucose, Bld: 100 mg/dL — ABNORMAL HIGH (ref 70–99)
Potassium: 3.9 mEq/L (ref 3.5–5.1)
Sodium: 137 mEq/L (ref 135–145)

## 2011-04-15 LAB — LIPID PANEL
Cholesterol: 121 mg/dL (ref 0–200)
HDL: 41 mg/dL (ref >39)
LDL Cholesterol: 67 mg/dL (ref 0–99)
Total CHOL/HDL Ratio: 3 RATIO
Triglycerides: 67 mg/dL (ref <150)
VLDL: 13 mg/dL (ref 0–40)

## 2011-04-15 LAB — CARDIAC PANEL(CRET KIN+CKTOT+MB+TROPI)
CK, MB: 2.4 ng/mL (ref 0.3–4.0)
Relative Index: INVALID (ref 0.0–2.5)
Total CK: 76 U/L (ref 7–232)
Troponin I: <0.3 ng/mL (ref <0.30)
Troponin I: <0.3 ng/mL (ref <0.30)

## 2011-04-15 LAB — HEPARIN LEVEL (UNFRACTIONATED)
Heparin Unfractionated: 0.49 IU/mL (ref 0.30–0.70)
Heparin Unfractionated: 0.41 IU/mL (ref 0.30–0.70)

## 2011-04-15 LAB — PROTIME-INR: Prothrombin Time: 16.1 seconds — ABNORMAL HIGH (ref 11.6–15.2)

## 2011-04-15 LAB — HEMOGLOBIN A1C
Hgb A1c MFr Bld: 5.9 % — ABNORMAL HIGH (ref <5.7)
Mean Plasma Glucose: 123 mg/dL — ABNORMAL HIGH (ref <117)

## 2011-04-15 MED ORDER — WARFARIN VIDEO: Freq: Once | Status: DC

## 2011-04-15 MED ORDER — AMIODARONE HCL 200 MG PO TABS
400.0000 mg | ORAL_TABLET | Freq: Every day | ORAL | Status: DC
  Administered 2011-04-15: 400 mg via ORAL
  Filled 2011-04-15: qty 2

## 2011-04-15 MED ORDER — ASPIRIN EC 81 MG PO TBEC: 81.0000 mg | DELAYED_RELEASE_TABLET | Freq: Every day | ORAL | Status: DC

## 2011-04-15 MED ORDER — PATIENT'S GUIDE TO USING COUMADIN BOOK
Freq: Once | Status: AC
  Administered 2011-04-15: 11:00:00
  Filled 2011-04-15 (×2): qty 1

## 2011-04-15 MED ORDER — ASPIRIN EC 325 MG PO TBEC: 325.0000 mg | DELAYED_RELEASE_TABLET | Freq: Every day | ORAL | Status: DC

## 2011-04-15 MED ORDER — AMIODARONE HCL 200 MG PO TABS: 400.0000 mg | ORAL_TABLET | Freq: Every day | ORAL | Status: DC

## 2011-04-15 MED ORDER — OFF THE BEAT BOOK
Freq: Once | Status: AC
  Administered 2011-04-15: 11:00:00
  Filled 2011-04-15: qty 1

## 2011-04-15 NOTE — Progress Notes (Signed)
  Echocardiogram 2D Echocardiogram has been performed.  Jorje Guild [facility] 04/15/2011, 10:40 AM

## 2011-04-15 NOTE — Progress Notes (Signed)
@   Subjective:  Denies CP or dyspnea   Objective:  Filed Vitals:   04/15/11 0200 04/15/11 0300 04/15/11 0400 04/15/11 0500  BP:    147/106  Pulse: 90 77 77 77  Temp:    97.9 F (36.6 C)  TempSrc:    Oral  Resp: 15 13 15 15   Height:      Weight:      SpO2: 93% 94% 94% 94%    Intake/Output from previous day:  Intake/Output Summary (Last 24 hours) at 04/15/11 4540 Last data filed at 04/15/11 0530  Gross per 24 hour  Intake      3 ml  Output    300 ml  Net   -297 ml    Physical Exam: Physical exam: Well-developed well-nourished in no acute distress.  Skin is warm and dry.  HEENT is normal.  Neck is supple. No thyromegaly.  Chest is clear to auscultation with normal expansion.  Cardiovascular exam is regular rate and rhythm.  Abdominal exam nontender or distended. No masses palpated. Extremities show no edema. neuro c/w parkinson's    Lab Results: Basic Metabolic Panel:  Basename 04/15/11 0206 04/14/11 2229 04/14/11 1651  NA 137 -- 138  K 3.9 -- 4.1  CL 105 -- 104  CO2 26 -- 23  GLUCOSE 100* -- 88  BUN 10 -- 9  CREATININE 1.21 -- 1.27  CALCIUM 9.3 -- 9.6  MG -- 2.3 --  PHOS -- -- --   CBC:  Basename 04/15/11 0206 04/14/11 1651  WBC 6.3 8.7  NEUTROABS -- 5.9  HGB 10.1* 12.1*  HCT 32.6* 40.0  MCV 79.7 81.3  PLT 207 231   Cardiac Enzymes:  Basename 04/15/11 0207 04/14/11 2229  CKTOTAL 68 67  CKMB 2.2 2.3  CKMBINDEX -- --  TROPONINI <0.30 <0.30     Assessment/Plan:  1) atrial fibrillation - patient back in sinus; TSH normal; change IV amiodarone to po; dc on 400 mg daily for 2 weeks and then 200 mg daily. Check echo. Dc heparin and coumadin and treat with ASA (not a good long term coumadin candidate given h/o orthostatic hypotension and falls). 2) Acute blood loss anemia - Hgb decreased compared to admission but no active signs of bleeding. Todays Hgb more in line with previous in Sept. Question lab error last PM. DC hep and coumadin. Recheck Hgb  later today. If stable DC home and fu with me 2-4 weeks in [place]. 3) H/O MV repair - echo today >30 min PA and physician time D2  Olga Millers 04/15/2011, 7:14 AM

## 2011-04-15 NOTE — Progress Notes (Signed)
UR Completed. Simmons, Kamon Fahr F [phone]  

## 2011-04-15 NOTE — Discharge Summary (Signed)
See progress notes Leianne Callins  

## 2011-04-15 NOTE — ED Provider Notes (Signed)
I saw and evaluated the patient, reviewed the resident's note and I agree with the findings and plan.   Loren Racer, MD 04/15/11 [phone]

## 2011-04-15 NOTE — Discharge Summary (Signed)
CARDIOLOGY DISCHARGE SUMMARY   Patient ID: [name] MRN: [id number] DOB/AGE: Dec 21, 1940 71 y.o.  Admit date: 04/14/2011 Discharge date: 04/15/2011  Primary Discharge Diagnosis:  PAF Secondary Discharge Diagnosis:  Patient Active Problem List  Diagnoses  . VENEREAL WART  . MENINGIOMA  . DEMENTIA  . DEPRESSION  . PARKINSON'S DISEASE  . HYPERTENSION  . CAD  . PULMONARY EMBOLISM  . Atrial fibrillation  . UNSPEC COMBINED SYSTOLIC&DIASTOLIC HEART FAILURE  . THROMBOPHLEBITIS  . DVT  . Orthostatic hypotension  . ALLERGIC RHINITIS  . ASTHMA  . GERD  . UTI  . OTHER DYSPHAGIA  . PULMONARY EMBOLISM, HX OF  . COLONIC POLYPS, HX OF  . BENIGN PROSTATIC HYPERTROPHY, HX OF  . MITRAL VALVE REPLACEMENT, HX OF  . CHICKENPOX, HX OF  . HYPERLIPIDEMIA   Procedures: 2D echo  Hospital Course: [name] is a 71 year old male with a history of coronary artery disease and mitral valve repair. He was seen in the office and was in rapid atrial fib, rate 145. He was admitted for further evaluation and treatment.   He had IV Heparin and Amiodarone added to his medication regimen. An echo was ordered because of DOE to assess his MV repair and EF. A chest X-ray was mildly abnormal but he was afebrile and his white count was OK, so this can be followed as an outpatient.   Overnight, he spontaneously converted to sinus rhythm. He was evaluated for coumadin and was initially started on it, but he has orthostatic hypotension and his fall risk is significant so he is to be on Aspirin alone. He was anemic but his hemoglobin was stable on a recheck and this can be followed as an outpatient.   On the afternoon of 04/15/2011, [name] was maintaining sinus rhythm on PO Amiodarone. He was ambulating at his baseline and his echo showed a normal EF with his mitral valve unchanged. He is considered stable for discharge, to follow up as an outpatient.  Labs:   Lab Results  Component Value Date   WBC 6.2  04/15/2011   HGB 10.4* 04/15/2011   HCT 33.4* 04/15/2011   MCV 79.7 04/15/2011   PLT 237 04/15/2011    Lab 04/15/11 0206 04/14/11 1651  NA 137 --  K 3.9 --  CL 105 --  CO2 26 --  BUN 10 --  CREATININE 1.21 --  CALCIUM 9.3 --  PROT -- 7.8  BILITOT -- 0.5  ALKPHOS -- 77  ALT -- <5  AST -- 14  GLUCOSE 100* --    Basename 04/15/11 0947 04/15/11 0207 04/14/11 2229  CKTOTAL 76 68 67  CKMB 2.4 2.2 2.3  CKMBINDEX -- -- --  TROPONINI <0.30 <0.30 <0.30   Lipid Panel     Component Value Date/Time   CHOL 121 04/15/2011 0207   TRIG 67 04/15/2011 0207   HDL 41 04/15/2011 0207   CHOLHDL 3.0 04/15/2011 0207   VLDL 13 04/15/2011 0207   LDLCALC 67 04/15/2011 0207    Basename 04/15/11 0206  INR 1.26    Radiology:  Dg Chest Port 1 View 04/14/2011  *RADIOLOGY REPORT*  Clinical Data: Atrial fibrillation with rapid ventricular response. History of bypass.  Nonsmoker.History pulmonary embolism. Demented.  PORTABLE CHEST - 1 VIEW  Comparison: 11/19/2010  Findings: Prior median sternotomy.  Prior valve repair.  Numerous leads and wires project over the chest.  Patient rotated right.  Moderate cardiomegaly.  Bilateral pleural thickening. Possible trace left pleural fluid. No  pneumothorax.  Worsened right greater than left bibasilar aeration.  IMPRESSION:  1.  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  Especially at the left lung base, superimposed acute atelectasis or infection cannot be excluded.  Consider short-term PA and lateral radiographic follow-up. 2. Cardiomegaly without congestive failure.  Original Report Authenticated By: Consuello Bossier, M.D.   EKG: 15-Apr-2011 08:17:22 Normal sinus rhythm Prolonged QT Since previous tracing 04/14/11 a fib resolved and axis is now normal Vent. rate 85 BPM PR interval 200 ms QRS duration 114 ms QT/QTc 420/499 ms P-R-T axes 15 39 63  Echo: Study Conclusions - Left ventricle: The cavity size was normal. Wall thickness was  increased in a pattern of moderate LVH. The estimated ejection fraction was 60%. Wall motion was normal; there were no regional wall motion abnormalities. - Mitral valve: By history there is a mitral valve repair. I do not know the exact procedure. There is an unusual color signal associated with the the mitral valve. it is seen in systole. It is probably mild Wayne. It is unchanged since the prior study. Valve area by pressure half-time: 2.22cm^2.  Mean gradient: 7mm Hg (D). Peak gradient: 13mm Hg (D). - Left atrium: The atrium was severely dilated. - Right ventricle: The RV is never seen. It can not be assessed. There may be RV dilitation and dysfunction.   FOLLOW UP PLANS AND APPOINTMENTS Discharge Orders    Future Appointments: Provider: Department: Dept Phone: Center:   05/20/2011 9:00 AM Nelwyn Salisbury, MD [facility] [phone] [facility]     Allergies  Allergen Reactions  . Ibuprofen     Stomach hurts   Medication List  As of 04/15/2011  2:26 PM   TAKE these medications         amiodarone 200 MG tablet   Commonly known as: PACERONE   Take 2 tablets (400 mg total) by mouth daily.      aspirin EC 325 MG tablet   Take 1 tablet (325 mg total) by mouth daily.      carbidopa-levodopa 25-100 MG per tablet   Commonly known as: SINEMET   Take 1 tablet by mouth 2 (two) times daily. 1 tablet at breakfast and lunch      chlorproMAZINE 25 MG tablet   Commonly known as: THORAZINE   Take 50 mg by mouth daily as needed. For hiccups      clonazePAM 0.5 MG tablet   Commonly known as: KLONOPIN   Take 0.25 mg by mouth 4 (four) times daily as needed. For spasms; at meals and bedtime      mulitivitamin with minerals Tabs   Take 1 tablet by mouth daily.      pantoprazole 40 MG tablet   Commonly known as: PROTONIX   Take 40 mg by mouth 2 (two) times daily.      polyethylene glycol packet   Commonly known as: MIRALAX / GLYCOLAX   Take 17 g by mouth daily as needed. For  constipation        pravastatin 40 MG tablet   Commonly known as: PRAVACHOL   Take 40 mg by mouth every evening.      promethazine 25 MG tablet   Commonly known as: PHENERGAN   Take 25 mg by mouth every 6 (six) hours as needed. For nausea      sertraline 100 MG tablet   Commonly known as: ZOLOFT   Take 100 mg by mouth every evening.      Tamsulosin  HCl 0.4 MG Caps   Commonly known as: FLOMAX   Take 0.4 mg by mouth daily after breakfast.      traZODone 150 MG tablet   Commonly known as: DESYREL   Take 150 mg by mouth at bedtime.           Follow-up Information    Follow up with FRY,STEPHEN A, MD in 2 weeks. (As needed)       Follow up with Olga Millers, MD. (See in [place] on March 20th at 3 pm .)    Contact information:    [phone]          BRING ALL MEDICATIONS WITH YOU TO FOLLOW UP APPOINTMENTS  Time spent with patient to include physician time: 30 min. Signed: Theodore Demark 04/15/2011, 2:26 PM Co-Sign MD

## 2011-04-15 NOTE — Progress Notes (Signed)
ANTICOAGULATION CONSULT NOTE - Follow Up Consult  Pharmacy Consult for heparin Indication: atrial fibrillation  Labs:  Basename 04/15/11 0207 04/15/11 0206 04/14/11 2229 04/14/11 1725 04/14/11 1651  HGB -- 10.1* -- -- 12.1*  HCT -- 32.6* -- -- 40.0  PLT -- 207 -- -- 231  APTT -- -- -- 32 --  LABPROT -- 16.1* -- 15.3* --  INR -- 1.26 -- 1.18 --  HEPARINUNFRC -- 0.49 -- -- --  CREATININE -- 1.21 -- -- 1.27  CKTOTAL 68 -- 67 -- --  CKMB 2.2 -- 2.3 -- --  TROPONINI <0.30 -- <0.30 -- --   Assessment/Plan: 71yo male therapeutic on heparin with initial dosing for Afib.  Will continue gtt at current rate and confirm stable with additional level.  Colleen Can PharmD BCPS 04/15/2011,6:34 AM

## 2011-04-16 ENCOUNTER — Telehealth: Payer: Self-pay | Admitting: Cardiology

## 2011-04-16 NOTE — Telephone Encounter (Signed)
Spoke with [facility], referred to dr Abran Cantor pcp

## 2011-04-16 NOTE — Telephone Encounter (Signed)
New problem:  Need an order for nurse aide to assist with bath. Due to wife breast surgery.

## 2011-04-19 ENCOUNTER — Telehealth: Payer: Self-pay | Admitting: *Deleted

## 2011-04-19 NOTE — Telephone Encounter (Signed)
Pt was in the hospital with AF the 20 and 21st with Dr. Jens Som.  When he came home with Home Health, he did not have a BM for 2-3 days and they were giving him Miralax.  No BM as of yet, and are asking for Dr. Claris Che recommendations.  Daughter is calling Bjorn Loser).

## 2011-04-20 NOTE — Telephone Encounter (Signed)
Tell him to take one bottle of magnesium citrate OTC

## 2011-04-20 NOTE — Telephone Encounter (Signed)
Spoke to wife

## 2011-04-21 ENCOUNTER — Telehealth: Payer: Self-pay | Admitting: Cardiology

## 2011-04-21 NOTE — Telephone Encounter (Signed)
Spoke with pt, he is having a pressure on his chest that comes and goes. The discomfort gets worse if he gets aginated and it feels better when he calms down. He does not feel he is out of rhythm. bp today was 120/70 pulse 60. He is constipated and feels that is what is causing his frustration, he has taken something and has had a little result. He is out of breath and fatigued when walking to the mailbox and back. The pt wanted to report and see if there was anything he needed to do. Will forward for dr Jens Som review

## 2011-04-21 NOTE — Telephone Encounter (Signed)
Fu in [place] next 2-3 weeks; if needs to be seen sooner, arrange fu with [place]. Olga Millers

## 2011-04-21 NOTE — Telephone Encounter (Signed)
New Msg: Pt wife calling wanting to speak with nurse regarding pt being in a-fib and c/o chest tightness. Pt not c/o any other symptoms. Pt wife stated chest tightness comes and goes. Pt would like to discuss how pt should proceed.   Please return  pt wife call to discuss further.

## 2011-04-22 NOTE — Telephone Encounter (Signed)
Spoke with pt wife, she reports the pt went to the bathroom several times last night and she thinks he maybe doing some better today. appt offered for pt to come to [facility] to ber seen. Pt wife declined and will call back if she feels the pt needs to be seen prior to scheduled appt.

## 2011-04-26 ENCOUNTER — Telehealth: Payer: Self-pay | Admitting: Family Medicine

## 2011-04-26 NOTE — Telephone Encounter (Signed)
Try Senekot 2 tabs twice daily (OTC)

## 2011-04-26 NOTE — Telephone Encounter (Signed)
Spoke with pt

## 2011-04-26 NOTE — Telephone Encounter (Signed)
Patient's been having trouble with constipation. Worried about taking too much Citrine. Please call to advise ASAP. Has not used bathroom since Saturday. Thanks.

## 2011-04-28 ENCOUNTER — Encounter (HOSPITAL_BASED_OUTPATIENT_CLINIC_OR_DEPARTMENT_OTHER): Payer: Self-pay | Admitting: Emergency Medicine

## 2011-04-28 ENCOUNTER — Emergency Department (INDEPENDENT_AMBULATORY_CARE_PROVIDER_SITE_OTHER): Payer: Medicare Other

## 2011-04-28 ENCOUNTER — Emergency Department (HOSPITAL_BASED_OUTPATIENT_CLINIC_OR_DEPARTMENT_OTHER)
Admission: EM | Admit: 2011-04-28 | Discharge: 2011-04-28 | Disposition: A | Discharge status: Home Health | Payer: Medicare Other | Attending: Emergency Medicine | Admitting: Emergency Medicine

## 2011-04-28 DIAGNOSIS — I4891 Unspecified atrial fibrillation: Secondary | ICD-10-CM | POA: Insufficient documentation

## 2011-04-28 DIAGNOSIS — R110 Nausea: Secondary | ICD-10-CM | POA: Insufficient documentation

## 2011-04-28 DIAGNOSIS — N390 Urinary tract infection, site not specified: Secondary | ICD-10-CM | POA: Insufficient documentation

## 2011-04-28 DIAGNOSIS — G20A1 Parkinson's disease without dyskinesia, without mention of fluctuations: Secondary | ICD-10-CM | POA: Insufficient documentation

## 2011-04-28 DIAGNOSIS — K5900 Constipation, unspecified: Secondary | ICD-10-CM

## 2011-04-28 DIAGNOSIS — G20 Parkinson's disease: Secondary | ICD-10-CM | POA: Insufficient documentation

## 2011-04-28 DIAGNOSIS — E785 Hyperlipidemia, unspecified: Secondary | ICD-10-CM | POA: Insufficient documentation

## 2011-04-28 DIAGNOSIS — R300 Dysuria: Secondary | ICD-10-CM | POA: Insufficient documentation

## 2011-04-28 DIAGNOSIS — F0390 Unspecified dementia without behavioral disturbance: Secondary | ICD-10-CM | POA: Insufficient documentation

## 2011-04-28 DIAGNOSIS — K219 Gastro-esophageal reflux disease without esophagitis: Secondary | ICD-10-CM | POA: Insufficient documentation

## 2011-04-28 DIAGNOSIS — J45909 Unspecified asthma, uncomplicated: Secondary | ICD-10-CM | POA: Insufficient documentation

## 2011-04-28 LAB — URINE MICROSCOPIC-ADD ON

## 2011-04-28 LAB — URINALYSIS, ROUTINE W REFLEX MICROSCOPIC
Bilirubin Urine: NEGATIVE
Ketones, ur: NEGATIVE mg/dL
Nitrite: NEGATIVE
Protein, ur: NEGATIVE mg/dL
Urobilinogen, UA: 1 mg/dL (ref 0.0–1.0)

## 2011-04-28 MED ORDER — CEPHALEXIN 500 MG PO CAPS: 500.0000 mg | ORAL_CAPSULE | Freq: Four times a day (QID) | ORAL | Status: DC

## 2011-04-28 MED ORDER — CEPHALEXIN 250 MG PO CAPS
500.0000 mg | ORAL_CAPSULE | Freq: Once | ORAL | Status: AC
  Administered 2011-04-28: 500 mg via ORAL
  Filled 2011-04-28: qty 2

## 2011-04-28 MED ORDER — FLEET ENEMA 7-19 GM/118ML RE ENEM
ENEMA | RECTAL | Status: AC
  Filled 2011-04-28: qty 1

## 2011-04-28 NOTE — Discharge Instructions (Signed)

## 2011-04-28 NOTE — ED Notes (Signed)
Pt states no BM x 4 days.  Some nausea, No V/D or fever.  Pt states he has taken several OTC medications but no results.

## 2011-04-28 NOTE — ED Provider Notes (Addendum)
History     CSN: [id number]  Arrival date & time 04/28/11  7829   First MD Initiated Contact with Patient 04/28/11 0940      Chief Complaint  Patient presents with  . Constipation  . Nausea    (Consider location/radiation/quality/duration/timing/severity/associated sxs/prior treatment) Patient is a 71 y.o. male presenting with constipation. The history is provided by the patient and the spouse.  Constipation  Episode onset: 4 days ago. The onset was gradual. The problem occurs continuously. The problem has been gradually worsening. The patient is experiencing no pain. The stool is described as soft. Prior successful therapies include laxatives. Prior unsuccessful therapies include diet changes, fiber and stool softeners. Associated symptoms include anorexia and nausea. Pertinent negatives include no fever, no diarrhea, no hemorrhoids, no vomiting, no chest pain, no coughing and no rash. Associated symptoms comments: Mild dysuria. He has been eating less than usual. Urine output has been normal. His past medical history does not include recent abdominal injury or recent antibiotic use. Past medical history comments: History of Parkinson's disease and the medications tend to make him constipated. There were no sick contacts. He has received no recent medical care.    Past Medical History  Diagnosis Date  . Atrial fibrillation   . PE (pulmonary embolism)   . DVT (deep venous thrombosis)   . Meningioma   . BPH (benign prostatic hyperplasia)   . Colon polyps   . Venereal wart   . Depression   . Asthma   . Chickenpox   . Thrombophlebitis   . GERD (gastroesophageal reflux disease)   . Parkinson disease   . Dementia   . CAD (coronary artery disease)   . Hyperlipidemia     Past Surgical History  Procedure Date  . Hernia repair   . Tonsilectomy, adenoidectomy, bilateral myringotomy and tubes   . Varicose vein surgery     left leg  . Back surgery     herniated disc & remove bone  spurs  . Coronary artery bypass graft     Family History  Problem Relation Age of Onset  . Arthritis    . Diabetes    . Stroke    . Heart disease Mother     History  Substance Use Topics  . Smoking status: Former Games developer  . Smokeless tobacco: Never Used  . Alcohol Use: No      Review of Systems  Constitutional: Negative for fever.  Respiratory: Negative for cough.   Cardiovascular: Negative for chest pain.  Gastrointestinal: Positive for nausea, constipation and anorexia. Negative for vomiting, diarrhea and hemorrhoids.  Skin: Negative for rash.  All other systems reviewed and are negative.    Allergies  Ibuprofen  Home Medications   Current Outpatient Rx  Name Route Sig Dispense Refill  . AMIODARONE HCL 200 MG PO TABS Oral Take 2 tablets (400 mg total) by mouth daily. 60 tablet 11    Take 2 tablets daily for 2 weeks,  Then take 1 tab ...  . ASPIRIN EC 81 MG PO TBEC Oral Take 1 tablet (81 mg total) by mouth daily. 30 tablet   . CARBIDOPA-LEVODOPA 25-100 MG PO TABS Oral Take 1 tablet by mouth 2 (two) times daily. 1 tablet at breakfast and lunch    . CHLORPROMAZINE HCL 25 MG PO TABS Oral Take 50 mg by mouth daily as needed. For hiccups    . CLONAZEPAM 0.5 MG PO TABS Oral Take 0.25 mg by mouth 4 (four) times daily as needed.  For spasms; at meals and bedtime    . ADULT MULTIVITAMIN W/MINERALS CH Oral Take 1 tablet by mouth daily.    Marland Kitchen PANTOPRAZOLE SODIUM 40 MG PO TBEC Oral Take 40 mg by mouth 2 (two) times daily.     Marland Kitchen POLYETHYLENE GLYCOL 3350 PO PACK Oral Take 17 g by mouth daily as needed. For constipation     . PRAVASTATIN SODIUM 40 MG PO TABS Oral Take 40 mg by mouth every evening.    Marland Kitchen PROMETHAZINE HCL 25 MG PO TABS Oral Take 25 mg by mouth every 6 (six) hours as needed. For nausea    . SERTRALINE HCL 100 MG PO TABS Oral Take 100 mg by mouth every evening.     Marland Kitchen TAMSULOSIN HCL 0.4 MG PO CAPS Oral Take 0.4 mg by mouth daily after breakfast.    . TRAZODONE HCL 150 MG  PO TABS Oral Take 150 mg by mouth at bedtime.      BP 109/68  Pulse 87  Temp(Src) 97.8 F (36.6 C) (Oral)  Resp 16  SpO2 98%  Physical Exam  Nursing note and vitals reviewed. Constitutional: He is oriented to person, place, and time. He appears well-developed and well-nourished. No distress.  HENT:  Head: Normocephalic and atraumatic.  Mouth/Throat: Oropharynx is clear and moist.  Eyes: Conjunctivae and EOM are normal. Pupils are equal, round, and reactive to light.  Neck: Normal range of motion. Neck supple.  Cardiovascular: Normal rate, regular rhythm and intact distal pulses.   Murmur heard.  Crescendo systolic murmur is present with a grade of 3/6  Pulmonary/Chest: Effort normal and breath sounds normal. No respiratory distress. He has no wheezes. He has no rales.  Abdominal: Soft. He exhibits no distension. There is no tenderness. There is no rebound and no guarding.  Genitourinary:       Soft stool at the fingertip no gross blood  Musculoskeletal: Normal range of motion. He exhibits no edema and no tenderness.  Neurological: He is alert and oriented to person, place, and time.  Skin: Skin is warm and dry. No rash noted. No erythema.  Psychiatric: He has a normal mood and affect. His behavior is normal.    ED Course  Procedures (including critical care time)  Labs Reviewed  URINALYSIS, ROUTINE W REFLEX MICROSCOPIC - Abnormal; Notable for the following:    APPearance CLOUDY (*)    Leukocytes, UA LARGE (*)    All other components within normal limits  URINE MICROSCOPIC-ADD ON - Abnormal; Notable for the following:    Bacteria, UA MANY (*)    All other components within normal limits  URINE CULTURE   Dg Abd 1 View  04/28/2011  *RADIOLOGY REPORT*  Clinical Data: Constipation. Nausea.  ABDOMEN - 1 VIEW  Comparison: No priors.  Findings: Single view of the abdomen demonstrates gas and stool spread throughout the colon to the level of the distal rectum.  No pathologic  distension of small bowel.  No definite evidence of pneumoperitoneum noted on this single supine view.  IMPRESSION: 1.  Nonobstructive bowel gas pattern.  No acute findings to account for the patient's symptoms.  Original Report Authenticated By: Florencia Reasons, M.D.     1. Constipation   2. UTI (lower urinary tract infection)       MDM   Patient here complaining of constipation. Last bowel movement was 4 days ago when he states he starting to feel a little bit nauseated as well. He's had no vomiting or diarrhea.  He does complain of mild burning with urination. On exam there is some soft stool at the fingertip but unable to express any stool. He has been taking Senokot at home and MiraLAX without improvement. Patient has no abdominal pain on exam or signs concerning for peritonitis. Will give fleets enema and check a KUB and UA.  11:12 AM Before enema patient had a large stool. He states his abdomen is feeling much better. He UA returned with signs of a urinary tract infection. Urine culture sent and patient started on Keflex.      Gwyneth Sprout, MD 04/28/11 1112  Gwyneth Sprout, MD 04/28/11 1115

## 2011-04-30 ENCOUNTER — Ambulatory Visit (INDEPENDENT_AMBULATORY_CARE_PROVIDER_SITE_OTHER): Payer: Medicare Other | Admitting: Family Medicine

## 2011-04-30 ENCOUNTER — Encounter: Payer: Self-pay | Admitting: Family Medicine

## 2011-04-30 VITALS — BP 114/60 | HR 89 | Temp 98.2°F

## 2011-04-30 DIAGNOSIS — I4891 Unspecified atrial fibrillation: Secondary | ICD-10-CM

## 2011-04-30 DIAGNOSIS — G20 Parkinson's disease: Secondary | ICD-10-CM

## 2011-04-30 DIAGNOSIS — K5909 Other constipation: Secondary | ICD-10-CM

## 2011-04-30 DIAGNOSIS — G4700 Insomnia, unspecified: Secondary | ICD-10-CM

## 2011-04-30 DIAGNOSIS — G20A1 Parkinson's disease without dyskinesia, without mention of fluctuations: Secondary | ICD-10-CM

## 2011-04-30 DIAGNOSIS — K5900 Constipation, unspecified: Secondary | ICD-10-CM

## 2011-04-30 MED ORDER — TEMAZEPAM 30 MG PO CAPS: 30.0000 mg | ORAL_CAPSULE | Freq: Every evening | ORAL | Status: DC | PRN

## 2011-04-30 NOTE — Progress Notes (Signed)
  Subjective:    Patient ID: [name], male    DOB: [birth year], 71 y.o.   MRN: [id number]  HPI Here for a hospital follow up and for some chronic issues. He has admitted overnight from 04-14-11 to 04-15-11 for a bout of paroxysmal atrial fibrillation with RVR. He was stabilized and reverted to sinus rhythm fairly quickly. He was started on Amiodarone, and he is set to follow up with Dr. Jens Som sometime soon. He has felt fairly well since then with no palpitations or SOB or chest pains. It decided that he should not be put on chronic anticoagulation due to his risk of falls, and I quite agree. He has struggled with constipation for several years, and most of this is due to his Parkinsons disease and due to the medications he takes. He has used Senekot, Fleets enemas, milk of magnesia, and Miralax with poor results. He was seen in the ED on 04-28-11 for this. Also he has struggled with insomnia for years, and it is getting worse. He currently takes Zoloft and Trazaodone at bedtime, along with Clonazepam qid.    Review of Systems  Constitutional: Positive for fatigue.  Respiratory: Negative.   Cardiovascular: Negative.   Gastrointestinal: Positive for constipation. Negative for nausea, vomiting, abdominal pain, diarrhea, blood in stool, abdominal distention and rectal pain.  Neurological: Positive for dizziness, tremors and weakness.       Objective:   Physical Exam  Constitutional:       In a wheelchair, alert   Cardiovascular: Normal rate, regular rhythm, normal heart sounds and intact distal pulses.  Exam reveals no gallop and no friction rub.        Has a 2/6 SM at the left sternal border  Abdominal: Soft. Bowel sounds are normal. He exhibits no distension and no mass. There is no tenderness. There is no rebound and no guarding.          Assessment & Plan:  For the constipation, we will refer him back to GI, since he has not seen them for years. Try Temazepam in addition to  Trazadone at bedtime, and move the Zoloft to the mornings. He seems to be stable right now from a cardiac standpoint.

## 2011-05-01 LAB — URINE CULTURE

## 2011-05-02 NOTE — ED Notes (Signed)
+   Urine. Chart sent to EDP office for review. Given Keflex-No sensitivity listed. °

## 2011-05-03 ENCOUNTER — Encounter: Payer: Self-pay | Admitting: Gastroenterology

## 2011-05-03 ENCOUNTER — Other Ambulatory Visit: Payer: Self-pay | Admitting: Family Medicine

## 2011-05-03 NOTE — ED Notes (Addendum)
Ok on Keflex per Lorenz Coaster. Patient informed of need to f/u when finished with rx.

## 2011-05-08 ENCOUNTER — Observation Stay (HOSPITAL_COMMUNITY)
Admission: AD | Admit: 2011-05-08 | Discharge: 2011-05-09 | DRG: 309 | Disposition: A | Discharge status: Home / Self Care | Payer: Medicare Other | Source: Other Acute Inpatient Hospital | Attending: Cardiovascular Disease | Admitting: Cardiovascular Disease

## 2011-05-08 ENCOUNTER — Encounter (HOSPITAL_COMMUNITY): Payer: Self-pay | Admitting: *Deleted

## 2011-05-08 DIAGNOSIS — F0390 Unspecified dementia without behavioral disturbance: Secondary | ICD-10-CM | POA: Insufficient documentation

## 2011-05-08 DIAGNOSIS — F068 Other specified mental disorders due to known physiological condition: Secondary | ICD-10-CM | POA: Insufficient documentation

## 2011-05-08 DIAGNOSIS — K5900 Constipation, unspecified: Secondary | ICD-10-CM | POA: Insufficient documentation

## 2011-05-08 DIAGNOSIS — Z87898 Personal history of other specified conditions: Secondary | ICD-10-CM | POA: Insufficient documentation

## 2011-05-08 DIAGNOSIS — F329 Major depressive disorder, single episode, unspecified: Secondary | ICD-10-CM | POA: Insufficient documentation

## 2011-05-08 DIAGNOSIS — Z951 Presence of aortocoronary bypass graft: Secondary | ICD-10-CM | POA: Insufficient documentation

## 2011-05-08 DIAGNOSIS — I2510 Atherosclerotic heart disease of native coronary artery without angina pectoris: Secondary | ICD-10-CM | POA: Insufficient documentation

## 2011-05-08 DIAGNOSIS — Z9889 Other specified postprocedural states: Secondary | ICD-10-CM | POA: Insufficient documentation

## 2011-05-08 DIAGNOSIS — I4891 Unspecified atrial fibrillation: Secondary | ICD-10-CM | POA: Insufficient documentation

## 2011-05-08 DIAGNOSIS — K219 Gastro-esophageal reflux disease without esophagitis: Secondary | ICD-10-CM | POA: Insufficient documentation

## 2011-05-08 DIAGNOSIS — J45909 Unspecified asthma, uncomplicated: Secondary | ICD-10-CM | POA: Insufficient documentation

## 2011-05-08 DIAGNOSIS — F3289 Other specified depressive episodes: Secondary | ICD-10-CM | POA: Insufficient documentation

## 2011-05-08 DIAGNOSIS — I5040 Unspecified combined systolic (congestive) and diastolic (congestive) heart failure: Secondary | ICD-10-CM | POA: Insufficient documentation

## 2011-05-08 DIAGNOSIS — E785 Hyperlipidemia, unspecified: Secondary | ICD-10-CM | POA: Insufficient documentation

## 2011-05-08 DIAGNOSIS — R002 Palpitations: Secondary | ICD-10-CM | POA: Insufficient documentation

## 2011-05-08 DIAGNOSIS — I10 Essential (primary) hypertension: Secondary | ICD-10-CM | POA: Insufficient documentation

## 2011-05-08 DIAGNOSIS — Z954 Presence of other heart-valve replacement: Secondary | ICD-10-CM | POA: Insufficient documentation

## 2011-05-08 DIAGNOSIS — G20 Parkinson's disease: Secondary | ICD-10-CM | POA: Insufficient documentation

## 2011-05-08 DIAGNOSIS — G20A1 Parkinson's disease without dyskinesia, without mention of fluctuations: Secondary | ICD-10-CM | POA: Insufficient documentation

## 2011-05-08 DIAGNOSIS — R079 Chest pain, unspecified: Principal | ICD-10-CM | POA: Insufficient documentation

## 2011-05-08 DIAGNOSIS — I059 Rheumatic mitral valve disease, unspecified: Secondary | ICD-10-CM | POA: Insufficient documentation

## 2011-05-08 LAB — CARDIAC PANEL(CRET KIN+CKTOT+MB+TROPI): Total CK: 63 U/L (ref 7–232)

## 2011-05-08 LAB — CREATININE, SERUM
Creatinine, Ser: 1.28 mg/dL (ref 0.50–1.35)
GFR calc Af Amer: 64 mL/min — ABNORMAL LOW (ref >90)
GFR calc non Af Amer: 55 mL/min — ABNORMAL LOW (ref >90)

## 2011-05-08 LAB — CBC
Platelets: 204 10*3/uL (ref 150–400)
RBC: 4.36 MIL/uL (ref 4.22–5.81)
WBC: 6.2 10*3/uL (ref 4.0–10.5)

## 2011-05-08 MED ORDER — TAMSULOSIN HCL 0.4 MG PO CAPS
0.4000 mg | ORAL_CAPSULE | Freq: Every day | ORAL | Status: DC
  Administered 2011-05-09: 0.4 mg via ORAL
  Filled 2011-05-08 (×2): qty 1

## 2011-05-08 MED ORDER — SENNA 8.6 MG PO TABS
1.0000 | ORAL_TABLET | Freq: Every day | ORAL | Status: DC | PRN
  Filled 2011-05-08: qty 1

## 2011-05-08 MED ORDER — CLONAZEPAM 0.5 MG PO TABS
0.2500 mg | ORAL_TABLET | Freq: Four times a day (QID) | ORAL | Status: DC | PRN
  Administered 2011-05-08: 0.25 mg via ORAL
  Filled 2011-05-08: qty 1

## 2011-05-08 MED ORDER — SODIUM CHLORIDE 0.9 % IJ SOLN
3.0000 mL | Freq: Two times a day (BID) | INTRAMUSCULAR | Status: DC
  Administered 2011-05-08: 3 mL via INTRAVENOUS

## 2011-05-08 MED ORDER — POLYETHYLENE GLYCOL 3350 17 GM/SCOOP PO POWD
0.5000 | ORAL | Status: DC | PRN
  Filled 2011-05-08: qty 255

## 2011-05-08 MED ORDER — CARBIDOPA-LEVODOPA 25-100 MG PO TABS
1.0000 | ORAL_TABLET | Freq: Two times a day (BID) | ORAL | Status: DC
  Administered 2011-05-08 – 2011-05-09 (×2): 1 via ORAL
  Filled 2011-05-08 (×3): qty 1

## 2011-05-08 MED ORDER — ADULT MULTIVITAMIN W/MINERALS CH
1.0000 | ORAL_TABLET | Freq: Every day | ORAL | Status: DC
  Administered 2011-05-08 – 2011-05-09 (×2): 1 via ORAL
  Filled 2011-05-08 (×2): qty 1

## 2011-05-08 MED ORDER — SODIUM CHLORIDE 0.9 % IJ SOLN
3.0000 mL | Freq: Two times a day (BID) | INTRAMUSCULAR | Status: DC
  Administered 2011-05-08 – 2011-05-09 (×2): 3 mL via INTRAVENOUS

## 2011-05-08 MED ORDER — AMIODARONE HCL 200 MG PO TABS
400.0000 mg | ORAL_TABLET | Freq: Every day | ORAL | Status: DC
  Administered 2011-05-08 – 2011-05-09 (×2): 400 mg via ORAL
  Filled 2011-05-08 (×2): qty 2

## 2011-05-08 MED ORDER — SERTRALINE HCL 100 MG PO TABS
100.0000 mg | ORAL_TABLET | Freq: Every day | ORAL | Status: DC
  Administered 2011-05-08: 100 mg via ORAL
  Filled 2011-05-08 (×2): qty 1

## 2011-05-08 MED ORDER — ASPIRIN EC 81 MG PO TBEC: 81.0000 mg | DELAYED_RELEASE_TABLET | Freq: Every day | ORAL | Status: DC

## 2011-05-08 MED ORDER — PANTOPRAZOLE SODIUM 40 MG PO TBEC
40.0000 mg | DELAYED_RELEASE_TABLET | Freq: Two times a day (BID) | ORAL | Status: DC
  Administered 2011-05-08 – 2011-05-09 (×2): 40 mg via ORAL
  Filled 2011-05-08: qty 1

## 2011-05-08 MED ORDER — TEMAZEPAM 15 MG PO CAPS: 30.0000 mg | ORAL_CAPSULE | Freq: Every evening | ORAL | Status: DC | PRN

## 2011-05-08 MED ORDER — ASPIRIN EC 81 MG PO TBEC
81.0000 mg | DELAYED_RELEASE_TABLET | Freq: Every day | ORAL | Status: DC
  Administered 2011-05-09: 81 mg via ORAL
  Filled 2011-05-08: qty 1

## 2011-05-08 MED ORDER — POLYETHYLENE GLYCOL 3350 17 G PO PACK
17.0000 g | PACK | Freq: Every day | ORAL | Status: DC | PRN
  Filled 2011-05-08: qty 1

## 2011-05-08 MED ORDER — SODIUM CHLORIDE 0.9 % IJ SOLN
3.0000 mL | INTRAMUSCULAR | Status: DC | PRN
  Administered 2011-05-09: 3 mL via INTRAVENOUS

## 2011-05-08 MED ORDER — TRAZODONE HCL 150 MG PO TABS
150.0000 mg | ORAL_TABLET | Freq: Every day | ORAL | Status: DC
  Administered 2011-05-08: 150 mg via ORAL
  Filled 2011-05-08 (×2): qty 1

## 2011-05-08 MED ORDER — FUROSEMIDE 20 MG PO TABS
20.0000 mg | ORAL_TABLET | Freq: Every day | ORAL | Status: DC
  Administered 2011-05-08 – 2011-05-09 (×2): 20 mg via ORAL
  Filled 2011-05-08 (×2): qty 1

## 2011-05-08 MED ORDER — ENOXAPARIN SODIUM 40 MG/0.4ML ~~LOC~~ SOLN
40.0000 mg | SUBCUTANEOUS | Status: DC
  Administered 2011-05-08: 40 mg via SUBCUTANEOUS
  Filled 2011-05-08 (×2): qty 0.4

## 2011-05-08 MED ORDER — SODIUM CHLORIDE 0.9 % IV SOLN: 250.0000 mL | INTRAVENOUS | Status: DC | PRN

## 2011-05-08 NOTE — H&P (Signed)
Physician History and Physical  Patient ID: [name] MRN: [id number] DOB/AGE: [birth year] 71 y.o. Admit date: 05/08/2011  Primary Care Physician: Nelwyn Salisbury, MD, MD Primary Cardiologist:  Jens Som  Active Problems:  * No active hospital problems. *    HPI: Debilitated 71 yo male with past medical history of coronary artery disease and atrial fibrillation.  Patient admitted in July of 2011 with chest pain. Cardiac catheterization revealed an ejection fraction of 55-60% and significant mitral regurgitation. The right coronary artery had no significant stenosis. There was a 40% circumflex. The proximal portion of the left anterior descending artery has about 90% narrowing. Echocardiogram showed normal LV function and severe mitral regurgitation. On July 12 of 2011 the patient underwent two-vessel coronary artery bypass grafting (left internal mammary artery to LAD, saphenous vein graft to obtuse marginal) and MV repair with a 22-mm Edwards ring and oversewing of left atrial appendage. Echo in Oct 2011 revealed mild systolic dysfunction, EF 45-50%; s/p mitral valve repair. There was no significant stenosis. There was a very eccentric jet of mitral regurgitation that was poorly characterized. It appeared mild but given a very complete CW doppler jet it could be more moderate. Normal RV size with mild systolic dysfunction. Mild pulmonary hypertension. Patient also with orthostatic hypotension.  Recnet issues with constipation.  Seen by Dr Abran Cantor and has finally had relief with mulitple laxatives and miralax.  Has had frequent bouts of PAF.  Felt his heart out of rhythm with palpitations and pressure in his chest.  In [facility] ER was in NSR with PVC Enzymes negative.  Wife requests transfer to [facility].  Patient asymptomatic now.     Review of systems complete and found to be negative unless listed above   Past Medical History  Diagnosis Date  . Atrial fibrillation   . PE (pulmonary  embolism)   . DVT (deep venous thrombosis)   . Meningioma   . BPH (benign prostatic hyperplasia)   . Colon polyps   . Venereal wart   . Depression   . Asthma   . Chickenpox   . Thrombophlebitis   . GERD (gastroesophageal reflux disease)   . Parkinson disease   . Dementia   . CAD (coronary artery disease)   . Hyperlipidemia     Family History  Problem Relation Age of Onset  . Arthritis    . Diabetes    . Stroke    . Heart disease Mother     History   Social History  . Marital Status: Married    Spouse Name: N/A    Number of Children: 2  . Years of Education: N/A   Occupational History  .      Retired   Social History Main Topics  . Smoking status: Former Games developer  . Smokeless tobacco: Never Used  . Alcohol Use: No  . Drug Use: No  . Sexually Active: Not on file   Other Topics Concern  . Not on file   Social History Narrative  . No narrative on file    Past Surgical History  Procedure Date  . Hernia repair   . Tonsilectomy, adenoidectomy, bilateral myringotomy and tubes   . Varicose vein surgery     left leg  . Back surgery     herniated disc & remove bone spurs  . Coronary artery bypass graft      Prescriptions prior to admission  Medication Sig Dispense Refill  . amiodarone (PACERONE) 200 MG tablet Take 400 mg by  mouth daily.      Marland Kitchen aspirin EC 81 MG tablet Take 81 mg by mouth daily.      . carbidopa-levodopa (SINEMET) 25-100 MG per tablet Take 1 tablet by mouth 2 (two) times daily. 1 tablet at breakfast and lunch      . cephALEXin (KEFLEX) 500 MG capsule Take 500 mg by mouth 4 (four) times daily. 04/28/11-05/08/11 for UTI      . chlorproMAZINE (THORAZINE) 25 MG tablet Take 50 mg by mouth daily as needed. For hiccups      . clonazePAM (KLONOPIN) 0.5 MG tablet Take 0.25 mg by mouth 4 (four) times daily as needed. For spasms; at meals and bedtime      . furosemide (LASIX) 20 MG tablet Take 20 mg by mouth daily.       . Multiple Vitamin (MULITIVITAMIN WITH  MINERALS) TABS Take 1 tablet by mouth daily.      . pantoprazole (PROTONIX) 40 MG tablet Take 40 mg by mouth 2 (two) times daily.       . polyethylene glycol (MIRALAX / GLYCOLAX) packet Take 17 g by mouth daily as needed. For constipation       . pravastatin (PRAVACHOL) 40 MG tablet Take 40 mg by mouth every evening.      . promethazine (PHENERGAN) 25 MG tablet Take 25 mg by mouth every 6 (six) hours as needed. For nausea      . sertraline (ZOLOFT) 100 MG tablet Take 100 mg by mouth every evening.       . Tamsulosin HCl (FLOMAX) 0.4 MG CAPS Take 0.4 mg by mouth daily after breakfast.      . temazepam (RESTORIL) 30 MG capsule Take 30 mg by mouth at bedtime as needed. For insomnia      . traZODone (DESYREL) 150 MG tablet Take 150 mg by mouth at bedtime.      Marland Kitchen DISCONTD: amiodarone (PACERONE) 200 MG tablet Take 2 tablets (400 mg total) by mouth daily.  60 tablet  11  . DISCONTD: aspirin EC 81 MG tablet Take 1 tablet (81 mg total) by mouth daily.  30 tablet    . DISCONTD: cephALEXin (KEFLEX) 500 MG capsule Take 1 capsule (500 mg total) by mouth 4 (four) times daily.  40 capsule  0  . DISCONTD: temazepam (RESTORIL) 30 MG capsule Take 1 capsule (30 mg total) by mouth at bedtime as needed for sleep.  30 capsule  5  . DISCONTD: polyethylene glycol powder (GLYCOLAX/MIRALAX) powder MIX 17GRAM ONCE DAILY IN LIQUID OF CHOICE AS NEEDED FOR CONSTIPATION  527 g  1  . DISCONTD: senna (SENOKOT) 8.6 MG TABS Take 1 tablet by mouth daily as needed.         Physical Exam: Blood pressure 124/76, pulse 78, resp. rate 20, height 6\' 3"  (1.905 m), weight 109.816 kg (242 lb 1.6 oz), SpO2 99.00%.  Debilitated white male older than stated age Obese Lungs clear S1/S2 distant Abdomen benign BS positive nontender  S/P choly and hernia repair Neuro nonfocal but stiff PT plus two Skin warm and dry HEENT normal Neck supple with no bruit or adenopathy  Labs:   Lab Results  Component Value Date   WBC 6.2 04/15/2011    HGB 10.4* 04/15/2011   HCT 33.4* 04/15/2011   MCV 79.7 04/15/2011   PLT 237 04/15/2011   No results found for this basename: NA,K,CL,CO2,BUN,CREATININE,CALCIUM,LABALBU,PROT,BILITOT,ALKPHOS,ALT,AST,GLUCOSE in the last 168 hours Lab Results  Component Value Date   CKTOTAL 76 04/15/2011  CKMB 2.4 04/15/2011   TROPONINI <0.30 04/15/2011    Lab Results  Component Value Date   CHOL 121 04/15/2011   CHOL 121 11/18/2010   CHOL 126 07/02/2010   Lab Results  Component Value Date   HDL 41 04/15/2011   HDL 46 1/61/0960   HDL 39* 07/02/2010   Lab Results  Component Value Date   LDLCALC 67 04/15/2011   LDLCALC 61 11/18/2010   LDLCALC 68 07/02/2010   Lab Results  Component Value Date   TRIG 67 04/15/2011   TRIG 68 11/18/2010   TRIG 96 07/02/2010   Lab Results  Component Value Date   CHOLHDL 3.0 04/15/2011   CHOLHDL 2.6 11/18/2010   CHOLHDL 3.2 07/02/2010   No results found for this basename: LDLDIRECT      Radiology: Dg Abd 1 View  04/28/2011  *RADIOLOGY REPORT*  Clinical Data: Constipation. Nausea.  ABDOMEN - 1 VIEW  Comparison: No priors.  Findings: Single view of the abdomen demonstrates gas and stool spread throughout the colon to the level of the distal rectum.  No pathologic distension of small bowel.  No definite evidence of pneumoperitoneum noted on this single supine view.  IMPRESSION: 1.  Nonobstructive bowel gas pattern.  No acute findings to account for the patient's symptoms.  Original Report Authenticated By: Florencia Reasons, M.D.   Dg Chest Port 1 View  04/14/2011  *RADIOLOGY REPORT*  Clinical Data: Atrial fibrillation with rapid ventricular response. History of bypass.  Nonsmoker.History pulmonary embolism. Demented.  PORTABLE CHEST - 1 VIEW  Comparison: 11/19/2010  Findings: Prior median sternotomy.  Prior valve repair.  Numerous leads and wires project over the chest.  Patient rotated right.  Moderate cardiomegaly.  Bilateral pleural thickening. Possible trace left pleural fluid. No  pneumothorax.  Worsened right greater than left bibasilar aeration.  IMPRESSION:  1.  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  Especially at the left lung base, superimposed acute atelectasis or infection cannot be excluded.  Consider short-term PA and lateral radiographic follow-up. 2. Cardiomegaly without congestive failure.  Original Report Authenticated By: Consuello Bossier, M.D.    EKG: ECG from [facility] not available Monitor NSR with PVC;s  ASSESSMENT AND PLAN: Chest Pain:  Doubt acute syndrome  R/O ECG continue home meds PAF:  On amiodarone continue  Telemetry.   MVR:  Recent echo with some residual MR but not severe.  Has LAE and propensity of PAF Dementia:  ? Degree of parkinsons  Continue sinemet and home meds Constipation:  miralax  Has gi F/U per Dr Abran Cantor  Likely d/c in am Already has F/U with Dr Jens Som arranged for next week  Signed: Theron Arista Nishan3/16/2013, 3:56 PM

## 2011-05-09 DIAGNOSIS — R002 Palpitations: Secondary | ICD-10-CM

## 2011-05-09 LAB — CARDIAC PANEL(CRET KIN+CKTOT+MB+TROPI)
CK, MB: 1.6 ng/mL (ref 0.3–4.0)
Relative Index: INVALID (ref 0.0–2.5)
Relative Index: INVALID (ref 0.0–2.5)
Total CK: 48 U/L (ref 7–232)
Total CK: 50 U/L (ref 7–232)
Troponin I: <0.3 ng/mL (ref <0.30)

## 2011-05-09 NOTE — Discharge Summary (Signed)
Patient ID: [name],  MRN: [id number], DOB/AGE: 70-01-1941 70 y.o.  Admit date: 05/08/2011 Discharge date: 05/09/2011  Primary Care Provider: Nelwyn Salisbury, MD Primary Cardiologist: B. Crenshaw, MD  Discharge Diagnoses Principal Problem:  *Midsternal chest pain Active Problems:  CAD  Atrial fibrillation  HYPERTENSION  UNSPEC COMBINED SYSTOLIC&DIASTOLIC HEART FAILURE  MITRAL VALVE REPLACEMENT, HX OF  HYPERLIPIDEMIA  Palpitations  DEMENTIA  DEPRESSION  PARKINSON'S DISEASE  ASTHMA  GERD  BENIGN PROSTATIC HYPERTROPHY, HX OF   Allergies Allergies  Allergen Reactions  . Ibuprofen     Stomach hurts    Procedures  None  History of Present Illness  71 y/o male with the above complex problem list.  He is s/p 2 vessel CABG in July of 2011 (LIMA-LAD, VG-OM) along with MV repair and oversewing of the LA appendage.  More recently, he has required admission for PAF (03/2011) and has been maintained on Amiodarone and ASA (poor coumadin candidate 2/2 unsteady gait in the setting of Parkinson's).  On the evening prior to admission, pt felt as though he was having palpitations consistent with atrial fibrillation.  Ss lasted about 1 hr and were associated with chest discomfort.  His wife brought him to the [facility] ED on the morning of 3/16, where he was in sinus rhythm and apparently without complaints.  His wife requested transfer to [facility] for further eval.  Hospital Course  Pt ruled out for MI.  He has had no atrial fibrillation or recurrent chest pain.  His home dose of Amiodarone has been continued.  We plan to d/c him home today in good condition.  He has f/u arranged with Dr. Jens Som later in the week.  Discharge Vitals Blood pressure 100/59, pulse 78, temperature 97.9 F (36.6 C), temperature source Oral, resp. rate 20, height 6\' 3"  (1.905 m), weight 242 lb 1.6 oz (109.816 kg), SpO2 96.00%.  Filed Weights   05/08/11 1525  Weight: 242 lb 1.6 oz (109.816 kg)     Labs  CBC  Basename 05/08/11 1617  WBC 6.2  NEUTROABS --  HGB 11.0*  HCT 35.2*  MCV 80.7  PLT 204   Basic Metabolic Panel  Basename 05/08/11 1617  NA --  K --  CL --  CO2 --  GLUCOSE --  BUN --  CREATININE 1.28  CALCIUM --  MG --  PHOS --   Cardiac Enzymes  Basename 05/08/11 2350 05/08/11 1617  CKTOTAL 50 63  CKMB 1.7 1.8  CKMBINDEX -- --  TROPONINI <0.30 <0.30   Disposition  Pt is being discharged home today in good condition.  Follow-up Plans & Appointments  Follow-up Information    Follow up with Olga Millers, MD on 05/12/2011. (3:30 PM - [facility])    Contact information:   [address] [place], [address] [place] [address] [phone]       Follow up with Nelwyn Salisbury, MD on 05/20/2011. (9:00 AM)    Contact information:   [address] [place] [address] [phone]          Discharge Medications  Medication List  As of 05/09/2011  9:09 AM   STOP taking these medications         cephALEXin 500 MG capsule      polyethylene glycol powder powder      senna 8.6 MG Tabs         TAKE these medications         amiodarone 200  MG tablet   Commonly known as: PACERONE   Take 400 mg by mouth daily.      aspirin EC 81 MG tablet   Take 81 mg by mouth daily.      carbidopa-levodopa 25-100 MG per tablet   Commonly known as: SINEMET   Take 1 tablet by mouth 2 (two) times daily. 1 tablet at breakfast and lunch      chlorproMAZINE 25 MG tablet   Commonly known as: THORAZINE   Take 50 mg by mouth daily as needed. For hiccups      clonazePAM 0.5 MG tablet   Commonly known as: KLONOPIN   Take 0.25 mg by mouth 4 (four) times daily as needed. For spasms; at meals and bedtime      furosemide 20 MG tablet   Commonly known as: LASIX   Take 20 mg by mouth daily.      mulitivitamin with minerals Tabs   Take 1 tablet by mouth daily.      pantoprazole 40 MG tablet    Commonly known as: PROTONIX   Take 40 mg by mouth 2 (two) times daily.      polyethylene glycol packet   Commonly known as: MIRALAX / GLYCOLAX   Take 17 g by mouth daily as needed. For constipation        pravastatin 40 MG tablet   Commonly known as: PRAVACHOL   Take 40 mg by mouth every evening.      promethazine 25 MG tablet   Commonly known as: PHENERGAN   Take 25 mg by mouth every 6 (six) hours as needed. For nausea      sertraline 100 MG tablet   Commonly known as: ZOLOFT   Take 100 mg by mouth every evening.      Tamsulosin HCl 0.4 MG Caps   Commonly known as: FLOMAX   Take 0.4 mg by mouth daily after breakfast.      temazepam 30 MG capsule   Commonly known as: RESTORIL   Take 30 mg by mouth at bedtime as needed. For insomnia      traZODone 150 MG tablet   Commonly known as: DESYREL   Take 150 mg by mouth at bedtime.           Outstanding Labs/Studies  None  Duration of Discharge Encounter   Greater than 30 minutes including physician time.  Signed, Nicolasa Ducking NP 05/09/2011, 9:09 AM

## 2011-05-09 NOTE — Discharge Summary (Signed)
See progress notes [name]  

## 2011-05-09 NOTE — Progress Notes (Addendum)
Patient Name: [name] Date of Encounter: 05/09/2011    Principal Problem:  *Midsternal chest pain Active Problems:  CAD  Atrial fibrillation  HYPERTENSION  UNSPEC COMBINED SYSTOLIC&DIASTOLIC HEART FAILURE  MITRAL VALVE REPLACEMENT, HX OF  HYPERLIPIDEMIA  Palpitations  DEMENTIA  DEPRESSION  PARKINSON'S DISEASE  ASTHMA  GERD  BENIGN PROSTATIC HYPERTROPHY, HX OF   SUBJECTIVE  No chest pain, sob, palpitations.  He thinks he had afib on Friday night, lasting about an hour.  Ss had resolved by the time he was seen in [place] ED.  CE negative.  CURRENT MEDS    . amiodarone  400 mg Oral Daily  . aspirin EC  81 mg Oral Daily  . carbidopa-levodopa  1 tablet Oral BID  . enoxaparin  40 mg Subcutaneous Q24H  . furosemide  20 mg Oral Daily  . mulitivitamin with minerals  1 tablet Oral Daily  . pantoprazole  40 mg Oral BID  . sertraline  100 mg Oral QHS  . Tamsulosin HCl  0.4 mg Oral QPC breakfast  . traZODone  150 mg Oral QHS   OBJECTIVE  Filed Vitals:   05/08/11 1906 05/08/11 1910 05/08/11 2007 05/09/11 0500  BP: 109/67 118/74 123/56 100/59  Pulse: 88 90 84 78  Temp:   97.9 F (36.6 C) 97.9 F (36.6 C)  TempSrc:   Oral Oral  Resp:      Height:      Weight:      SpO2:   96% 96%    Intake/Output Summary (Last 24 hours) at 05/09/11 0836 Last data filed at 05/08/11 2000  Gross per 24 hour  Intake    120 ml  Output      0 ml  Net    120 ml   Filed Weights   05/08/11 1525  Weight: 242 lb 1.6 oz (109.816 kg)   PHYSICAL EXAM  General: Pleasant, NAD. Neuro: Alert and oriented X 3. Moves all extremities spontaneously.  Resting tremor. Psych: Normal affect. HEENT:  Normal  Neck: Supple without bruits or JVD. Lungs:  Resp regular and unlabored, CTA. Heart: RRR no s3, s4.  3/6 SEM loudest @ LUSB.  Softer, blowing quality murmur @ apex. Abdomen: Soft, non-tender, non-distended, BS + x 4.  Extremities: No clubbing, cyanosis or edema. DP/PT/Radials 2+  and equal bilaterally.  Accessory Clinical Findings  CBC  Basename 05/08/11 1617  WBC 6.2  NEUTROABS --  HGB 11.0*  HCT 35.2*  MCV 80.7  PLT 204   Basic Metabolic Panel  Basename 05/08/11 1617  NA --  K --  CL --  CO2 --  GLUCOSE --  BUN --  CREATININE 1.28  CALCIUM --  MG --  PHOS --   Cardiac Enzymes  Basename 05/08/11 2350 05/08/11 1617  CKTOTAL 50 63  CKMB 1.7 1.8  CKMBINDEX -- --  TROPONINI <0.30 <0.30   TELE  RSR, 1st degree avb, pvc's  ASSESSMENT AND PLAN  1.  PAF:  Pt felt palpitations on Friday night.  No recurrence.  In sinus during admission here.  Cont amio, asa.  Poor coumadin candidate 2/2 unsteady gait.  2.  Chest Pain/CAD:  Pt had c/p in setting of palpitations.  No further c/p.  CE negative.  No obj evidence of ischemia.  Cont asa, statin (on prava @ home).  3.  Parkinson's:  Cont home meds.  4.  Dispo:  Home today.  Has f/u with [facility] on 3/20.   Signed, Nicolasa Ducking NP As  above; patient without complaints of; remains in sinus. No chest pain and enzymes negative; plan DC today on preadmission meds and FU with me as scheduled >30 min PA and physician time D2 Olga Millers

## 2011-05-09 NOTE — Discharge Instructions (Signed)
***  PLEASE REMEMBER TO BRING ALL OF YOUR MEDICATIONS TO EACH OF YOUR FOLLOW-UP OFFICE VISITS.  

## 2011-05-12 ENCOUNTER — Encounter: Payer: Self-pay | Admitting: Cardiology

## 2011-05-12 ENCOUNTER — Ambulatory Visit (INDEPENDENT_AMBULATORY_CARE_PROVIDER_SITE_OTHER): Payer: Medicare Other | Admitting: Cardiology

## 2011-05-12 VITALS — BP 122/84 | HR 81 | Ht 75.0 in | Wt 244.1 lb

## 2011-05-12 DIAGNOSIS — I509 Heart failure, unspecified: Secondary | ICD-10-CM

## 2011-05-12 DIAGNOSIS — I2510 Atherosclerotic heart disease of native coronary artery without angina pectoris: Secondary | ICD-10-CM

## 2011-05-12 DIAGNOSIS — I951 Orthostatic hypotension: Secondary | ICD-10-CM

## 2011-05-12 DIAGNOSIS — I5040 Unspecified combined systolic (congestive) and diastolic (congestive) heart failure: Secondary | ICD-10-CM

## 2011-05-12 DIAGNOSIS — Z9889 Other specified postprocedural states: Secondary | ICD-10-CM

## 2011-05-12 DIAGNOSIS — I4891 Unspecified atrial fibrillation: Secondary | ICD-10-CM

## 2011-05-12 DIAGNOSIS — E785 Hyperlipidemia, unspecified: Secondary | ICD-10-CM

## 2011-05-12 NOTE — Assessment & Plan Note (Signed)
Continue statin. 

## 2011-05-12 NOTE — Assessment & Plan Note (Signed)
Patient remains in sinus rhythm. Continue amiodarone but decrease dose to 200 mg daily. Patient to return in 3 months. At that time check TSH, liver functions and chest x-ray. Continue aspirin. I do not think he is a good Coumadin candidate given his Parkinson's, gait instability and orthostatic hypotension.

## 2011-05-12 NOTE — Progress Notes (Signed)
HPI: Pleasant male with past medical history of coronary artery disease and atrial fibrillation for followup. Patient admitted in July of 2011 with chest pain. Cardiac catheterization revealed an ejection fraction of 55-60% and significant mitral regurgitation. The right coronary artery had no significant stenosis. There was a 40% circumflex. The proximal portion of the left anterior descending artery has about 90% narrowing. Echocardiogram showed normal LV function and severe mitral regurgitation. On July 12 of 2011 the patient underwent two-vessel coronary artery bypass grafting (left internal mammary artery to LAD, saphenous vein graft to obtuse marginal) and MV repair with a 22-mm Edwards ring and oversewing of left atrial appendage. Patient also with orthostatic hypotension. When I last saw him he had developed atrial fibrillation and was admitted and placed on amiodarone. Patient converted to sinus rhythm. Echocardiogram repeated in February 2013. This revealed an ejection fraction of 60%. There was mitral valve repair. Mild mitral regurgitation. The left atrium was severely dilated. There is a question of RV dilatation and dysfunction. Note he was readmitted with what he felt might be atrial fibrillation and chest pain. He ruled out for myocardial infarction. Since DC, there is no dyspnea, chest pain, palpitations or syncope.   Current Outpatient Prescriptions  Medication Sig Dispense Refill  . amiodarone (PACERONE) 200 MG tablet Take 400 mg by mouth daily.      Marland Kitchen aspirin EC 81 MG tablet Take 81 mg by mouth daily.      . carbidopa-levodopa (SINEMET) 25-100 MG per tablet Take 1 tablet by mouth 2 (two) times daily. 1 tablet at breakfast and lunch      . chlorproMAZINE (THORAZINE) 25 MG tablet Take 50 mg by mouth daily as needed. For hiccups      . clonazePAM (KLONOPIN) 0.5 MG tablet Take 0.25 mg by mouth 4 (four) times daily as needed. For spasms; at meals and bedtime      . furosemide (LASIX) 20  MG tablet Take 20 mg by mouth daily.       . Multiple Vitamin (MULITIVITAMIN WITH MINERALS) TABS Take 1 tablet by mouth daily.      . pantoprazole (PROTONIX) 40 MG tablet Take 40 mg by mouth 2 (two) times daily.       . polyethylene glycol (MIRALAX / GLYCOLAX) packet Take 17 g by mouth daily as needed. For constipation       . pravastatin (PRAVACHOL) 40 MG tablet Take 40 mg by mouth every evening.      . promethazine (PHENERGAN) 25 MG tablet Take 25 mg by mouth every 6 (six) hours as needed. For nausea      . sertraline (ZOLOFT) 100 MG tablet Take 100 mg by mouth every evening.       . Tamsulosin HCl (FLOMAX) 0.4 MG CAPS Take 0.4 mg by mouth daily after breakfast.      . temazepam (RESTORIL) 30 MG capsule Take 30 mg by mouth at bedtime as needed. For insomnia      . traZODone (DESYREL) 150 MG tablet Take 150 mg by mouth at bedtime.         Past Medical History  Diagnosis Date  . Atrial fibrillation   . PE (pulmonary embolism)   . DVT (deep venous thrombosis)   . Meningioma   . BPH (benign prostatic hyperplasia)   . Colon polyps   . Venereal wart   . Depression   . Asthma   . Chickenpox   . Thrombophlebitis   . GERD (gastroesophageal reflux disease)   .  Parkinson disease   . Dementia   . CAD (coronary artery disease)   . Hyperlipidemia   . Angina     Past Surgical History  Procedure Date  . Hernia repair   . Tonsilectomy, adenoidectomy, bilateral myringotomy and tubes   . Varicose vein surgery     left leg  . Back surgery     herniated disc & remove bone spurs  . Coronary artery bypass graft   . Mitral valve repair     History   Social History  . Marital Status: Married    Spouse Name: N/A    Number of Children: 2  . Years of Education: N/A   Occupational History  .      Retired   Social History Main Topics  . Smoking status: Never Smoker   . Smokeless tobacco: Never Used  . Alcohol Use: No  . Drug Use: No  . Sexually Active: No   Other Topics Concern    . Not on file   Social History Narrative  . No narrative on file    ROS: no fevers or chills, productive cough, hemoptysis, dysphasia, odynophagia, melena, hematochezia, dysuria, hematuria, rash, seizure activity, orthopnea, PND, pedal edema, claudication. Remaining systems are negative.  Physical Exam: Well-developed well-nourished in no acute distress.  Skin is warm and dry.  HEENT is normal.  Neck is supple. No thyromegaly.  Chest is clear to auscultation with normal expansion.  Cardiovascular exam is regular rate and rhythm. 3/6 systolic murmur left sternal border. Abdominal exam nontender or distended. No masses palpated. Extremities show no edema. neuro moves all extremities.  ECG sinus rhythm at a rate of 81. Occasional PVCs. Cannot rule out prior inferior infarct. Right axis deviation.

## 2011-05-12 NOTE — Assessment & Plan Note (Signed)
Continue aspirin and statin. 

## 2011-05-12 NOTE — Assessment & Plan Note (Signed)
History of mitral valve repair. Continue SBE prophylaxis. 

## 2011-05-12 NOTE — Assessment & Plan Note (Signed)
Symptoms acceptable at present.

## 2011-05-12 NOTE — Patient Instructions (Signed)
Your physician recommends that you schedule a follow-up appointment in: 3 MONTHS  DECREASE AMIODARONE TO 200 MG ONCE DAILY  DECREASE FUROSEMIDE TO AS NEEDED

## 2011-05-12 NOTE — Assessment & Plan Note (Signed)
Patient is euvolemic. Change Lasix to 20 mg daily as needed.

## 2011-05-17 ENCOUNTER — Ambulatory Visit (INDEPENDENT_AMBULATORY_CARE_PROVIDER_SITE_OTHER): Payer: Medicare Other | Admitting: Family Medicine

## 2011-05-17 ENCOUNTER — Encounter: Payer: Self-pay | Admitting: *Deleted

## 2011-05-17 VITALS — BP 114/60 | HR 83 | Temp 97.6°F

## 2011-05-17 DIAGNOSIS — I4891 Unspecified atrial fibrillation: Secondary | ICD-10-CM

## 2011-05-17 DIAGNOSIS — R55 Syncope and collapse: Secondary | ICD-10-CM

## 2011-05-17 DIAGNOSIS — I10 Essential (primary) hypertension: Secondary | ICD-10-CM

## 2011-05-17 NOTE — Progress Notes (Signed)
  Subjective:    Patient ID: [name], male    DOB: Jan 30, 1941, 70 y.o.   MRN: [id number]  HPI Here with his wife to follow up recent atrial fibrillation and other issues. He saw Dr. Jens Som 5 days ago, and he seemed to be doing well from a cardiac standpoint. He remains in sinus rhythm. No SOB or chest pains or palpitations. However this afternoon while getting ready to come here he got lightheaded when he stood up and fell. There was a brief LOC apparently but he regained consciousness very quickly. No injuries. No new neurologic deficits today, but his wife has noticed a slight drooping of the right side of his face that comes and goes for the past few weeks. This usually only lasts for several minutes at a time. No slurred speech.    Review of Systems  Respiratory: Negative.   Cardiovascular: Negative.   Neurological: Positive for tremors, syncope, facial asymmetry, weakness and light-headedness. Negative for dizziness, seizures, speech difficulty, numbness and headaches.       Objective:   Physical Exam  Constitutional: He is oriented to person, place, and time. He appears well-developed and well-nourished. No distress.  Eyes: Conjunctivae and EOM are normal. Pupils are equal, round, and reactive to light.  Neck: Neck supple. No thyromegaly present.  Cardiovascular: Normal rate, regular rhythm, normal heart sounds and intact distal pulses.  Exam reveals no gallop and no friction rub.        Has his usual 3/6 SM at the left sternal border. No carotid bruits   Pulmonary/Chest: Effort normal and breath sounds normal. He has no wheezes. He has no rales.  Lymphadenopathy:    He has no cervical adenopathy.  Neurological: He is alert and oriented to person, place, and time. No cranial nerve deficit. Coordination abnormal.          Assessment & Plan:  His atrial fibrillation is well controlled. He will continue on aspirin daily. He is taking several medications with dubious  medical benefits but which are likely causing side effects, and which may be contributing to some postural hypotension. We will stop taking Flomax, Temazepam, and Thorazine. From his wife's descriptions he may be having some TIA events, so we will set up a carotid doppler scan soon.

## 2011-05-18 ENCOUNTER — Ambulatory Visit: Payer: Medicare Other | Admitting: Gastroenterology

## 2011-05-19 ENCOUNTER — Encounter: Payer: Self-pay | Admitting: Gastroenterology

## 2011-05-20 ENCOUNTER — Ambulatory Visit: Payer: Medicare Other | Admitting: Family Medicine

## 2011-05-23 ENCOUNTER — Other Ambulatory Visit: Payer: Self-pay | Admitting: Family Medicine

## 2011-05-24 ENCOUNTER — Encounter: Payer: Self-pay | Admitting: Gastroenterology

## 2011-05-26 ENCOUNTER — Other Ambulatory Visit: Payer: Self-pay | Admitting: *Deleted

## 2011-05-26 DIAGNOSIS — R55 Syncope and collapse: Secondary | ICD-10-CM

## 2011-05-31 ENCOUNTER — Encounter (INDEPENDENT_AMBULATORY_CARE_PROVIDER_SITE_OTHER): Payer: Medicare Other

## 2011-05-31 DIAGNOSIS — R42 Dizziness and giddiness: Secondary | ICD-10-CM

## 2011-05-31 DIAGNOSIS — R269 Unspecified abnormalities of gait and mobility: Secondary | ICD-10-CM

## 2011-05-31 DIAGNOSIS — R55 Syncope and collapse: Secondary | ICD-10-CM

## 2011-06-02 ENCOUNTER — Emergency Department (HOSPITAL_COMMUNITY)
Admission: EM | Admit: 2011-06-02 | Discharge: 2011-06-03 | Disposition: A | Discharge status: Home / Self Care | Payer: Medicare Other | Attending: Emergency Medicine | Admitting: Emergency Medicine

## 2011-06-02 ENCOUNTER — Encounter (HOSPITAL_COMMUNITY): Payer: Self-pay | Admitting: Adult Health

## 2011-06-02 DIAGNOSIS — R209 Unspecified disturbances of skin sensation: Secondary | ICD-10-CM | POA: Insufficient documentation

## 2011-06-02 DIAGNOSIS — K219 Gastro-esophageal reflux disease without esophagitis: Secondary | ICD-10-CM | POA: Insufficient documentation

## 2011-06-02 DIAGNOSIS — I2510 Atherosclerotic heart disease of native coronary artery without angina pectoris: Secondary | ICD-10-CM | POA: Insufficient documentation

## 2011-06-02 DIAGNOSIS — I4891 Unspecified atrial fibrillation: Secondary | ICD-10-CM | POA: Insufficient documentation

## 2011-06-02 DIAGNOSIS — G20A1 Parkinson's disease without dyskinesia, without mention of fluctuations: Secondary | ICD-10-CM | POA: Insufficient documentation

## 2011-06-02 DIAGNOSIS — R300 Dysuria: Secondary | ICD-10-CM | POA: Insufficient documentation

## 2011-06-02 DIAGNOSIS — K117 Disturbances of salivary secretion: Secondary | ICD-10-CM | POA: Insufficient documentation

## 2011-06-02 DIAGNOSIS — F3289 Other specified depressive episodes: Secondary | ICD-10-CM | POA: Insufficient documentation

## 2011-06-02 DIAGNOSIS — G20 Parkinson's disease: Secondary | ICD-10-CM | POA: Insufficient documentation

## 2011-06-02 DIAGNOSIS — R5381 Other malaise: Secondary | ICD-10-CM | POA: Insufficient documentation

## 2011-06-02 DIAGNOSIS — J45909 Unspecified asthma, uncomplicated: Secondary | ICD-10-CM | POA: Insufficient documentation

## 2011-06-02 DIAGNOSIS — R5383 Other fatigue: Secondary | ICD-10-CM | POA: Insufficient documentation

## 2011-06-02 DIAGNOSIS — F068 Other specified mental disorders due to known physiological condition: Secondary | ICD-10-CM | POA: Insufficient documentation

## 2011-06-02 DIAGNOSIS — Z86718 Personal history of other venous thrombosis and embolism: Secondary | ICD-10-CM | POA: Insufficient documentation

## 2011-06-02 DIAGNOSIS — Z79899 Other long term (current) drug therapy: Secondary | ICD-10-CM | POA: Insufficient documentation

## 2011-06-02 DIAGNOSIS — F329 Major depressive disorder, single episode, unspecified: Secondary | ICD-10-CM | POA: Insufficient documentation

## 2011-06-02 DIAGNOSIS — E785 Hyperlipidemia, unspecified: Secondary | ICD-10-CM | POA: Insufficient documentation

## 2011-06-02 DIAGNOSIS — Z7982 Long term (current) use of aspirin: Secondary | ICD-10-CM | POA: Insufficient documentation

## 2011-06-02 NOTE — ED Notes (Addendum)
From home c/o numbness and pain all over body. DX with UTI and was taking keflex but was taken off keflex due to adverse reactions, no other antibiotics given.  States, "I believe I went into atrial fib again, I get numb when I do." no complaints at this time

## 2011-06-03 ENCOUNTER — Emergency Department (HOSPITAL_COMMUNITY): Payer: Medicare Other

## 2011-06-03 LAB — DIFFERENTIAL
Basophils Absolute: 0 10*3/uL (ref 0.0–0.1)
Eosinophils Absolute: 0.2 10*3/uL (ref 0.0–0.7)
Eosinophils Relative: 2 % (ref 0–5)
Lymphs Abs: 1.5 10*3/uL (ref 0.7–4.0)
Neutrophils Relative %: 74 % (ref 43–77)

## 2011-06-03 LAB — COMPREHENSIVE METABOLIC PANEL
ALT: 9 U/L (ref 0–53)
AST: 17 U/L (ref 0–37)
Albumin: 3.8 g/dL (ref 3.5–5.2)
Alkaline Phosphatase: 72 U/L (ref 39–117)
Calcium: 8.9 mg/dL (ref 8.4–10.5)
Potassium: 4 mEq/L (ref 3.5–5.1)
Sodium: 135 mEq/L (ref 135–145)
Total Protein: 7.2 g/dL (ref 6.0–8.3)

## 2011-06-03 LAB — CBC
MCH: 24.8 pg — ABNORMAL LOW (ref 26.0–34.0)
MCV: 80.4 fL (ref 78.0–100.0)
Platelets: 240 10*3/uL (ref 150–400)
RBC: 4.39 MIL/uL (ref 4.22–5.81)
RDW: 16.5 % — ABNORMAL HIGH (ref 11.5–15.5)
WBC: 11 10*3/uL — ABNORMAL HIGH (ref 4.0–10.5)

## 2011-06-03 LAB — URINALYSIS, ROUTINE W REFLEX MICROSCOPIC
Glucose, UA: NEGATIVE mg/dL
Hgb urine dipstick: NEGATIVE
Leukocytes, UA: NEGATIVE
Protein, ur: NEGATIVE mg/dL
Specific Gravity, Urine: 1.006 (ref 1.005–1.030)
pH: 6.5 (ref 5.0–8.0)

## 2011-06-03 MED ORDER — SODIUM CHLORIDE 0.9 % IV SOLN
1000.0000 mL | INTRAVENOUS | Status: DC
  Administered 2011-06-03: 1000 mL via INTRAVENOUS

## 2011-06-03 NOTE — ED Provider Notes (Signed)
History     CSN: [id number]  Arrival date & time 06/02/11  2240   First MD Initiated Contact with Patient 06/03/11 0000      Chief Complaint  Patient presents with  . Numbness  . Urinary Tract Infection    HPI Pt states he started having trouble with some burning with urination today.  Pt had recently been diagnosed with keflex but was told to stop it for some reason.  Family started the medication again today.  He has had some dry mouth again and general weakness.  He felt like his mouth was numb.  No chest pain, no shortness of breath.  Occsnl cough.  No vomiting or diarrhea.  No fevers measured. Past Medical History  Diagnosis Date  . Atrial fibrillation   . PE (pulmonary embolism)   . DVT (deep venous thrombosis)   . Meningioma   . BPH (benign prostatic hyperplasia)   . Personal history of colonic polyps   . Venereal wart   . Depression   . Asthma   . Chickenpox   . Thrombophlebitis   . GERD (gastroesophageal reflux disease)   . Parkinson disease   . Dementia   . CAD (coronary artery disease)   . Hyperlipidemia   . Angina   . Stricture and stenosis of esophagus   . Hiatal hernia   . Diverticulosis of colon (without mention of hemorrhage)     Past Surgical History  Procedure Date  . Inguinal hernia repair   . Tonsilectomy, adenoidectomy, bilateral myringotomy and tubes   . Varicose vein surgery     left leg  . Back surgery     herniated disc & remove bone spurs  . Coronary artery bypass graft   . Mitral valve repair   . Umbilical hernia repair   . External ear surgery     Family History  Problem Relation Age of Onset  . Arthritis    . Diabetes    . Stroke    . Heart disease Mother     History  Substance Use Topics  . Smoking status: Never Smoker   . Smokeless tobacco: Never Used  . Alcohol Use: No      Review of Systems  Constitutional:       Insomnia   All other systems reviewed and are negative.    Allergies  Ibuprofen  Home  Medications   Current Outpatient Rx  Name Route Sig Dispense Refill  . AMIODARONE HCL 200 MG PO TABS Oral Take 1 tablet (200 mg total) by mouth daily.    . ASPIRIN EC 81 MG PO TBEC Oral Take 81 mg by mouth daily.    Marland Kitchen CARBIDOPA-LEVODOPA 25-100 MG PO TABS Oral Take 1 tablet by mouth 2 (two) times daily. 1 tablet at breakfast and lunch    . CLONAZEPAM 0.5 MG PO TABS Oral Take 0.25 mg by mouth 4 (four) times daily as needed. For spasms; at meals and bedtime    . FUROSEMIDE 20 MG PO TABS Oral Take 20 mg by mouth as needed.     . ADULT MULTIVITAMIN W/MINERALS CH Oral Take 1 tablet by mouth daily.    Marland Kitchen POLYETHYLENE GLYCOL 3350 PO PACK Oral Take 17 g by mouth daily as needed. For constipation     . PROMETHAZINE HCL 25 MG PO TABS Oral Take 25 mg by mouth every 6 (six) hours as needed. For nausea    . PROTONIX 40 MG PO TBEC  TAKE 1 TABLET TWICE A  DAY 60 each 11  . SERTRALINE HCL 100 MG PO TABS Oral Take 100 mg by mouth every evening.     Marland Kitchen TAMSULOSIN HCL 0.4 MG PO CAPS Oral Take 0.4 mg by mouth daily after breakfast.    . TRAZODONE HCL 150 MG PO TABS Oral Take 150 mg by mouth at bedtime.      BP 124/75  Pulse 87  Temp 97.9 F (36.6 C)  Resp 19  SpO2 100%  Physical Exam  Nursing note and vitals reviewed. Constitutional: He appears well-developed and well-nourished. No distress.  HENT:  Head: Normocephalic and atraumatic.  Right Ear: External ear normal.  Left Ear: External ear normal.  Mouth/Throat: Mucous membranes are dry. Posterior oropharyngeal erythema present. No oropharyngeal exudate or tonsillar abscesses.  Eyes: Conjunctivae are normal. Right eye exhibits no discharge. Left eye exhibits no discharge. No scleral icterus.  Neck: Neck supple. No tracheal deviation present.  Cardiovascular: Normal rate, regular rhythm and intact distal pulses.   Pulmonary/Chest: Effort normal and breath sounds normal. No stridor. No respiratory distress. He has no wheezes. He has no rales.    Abdominal: Soft. Bowel sounds are normal. He exhibits no distension. There is no tenderness. There is no rebound and no guarding.  Musculoskeletal: He exhibits no edema and no tenderness.  Neurological: He is alert. He has normal strength. No sensory deficit. Cranial nerve deficit:  no gross defecits noted. He exhibits normal muscle tone. He displays no seizure activity. Coordination normal.  Skin: Skin is warm and dry. No rash noted.  Psychiatric: He has a normal mood and affect.    ED Course  Procedures (including critical care time)  Labs Reviewed  CBC - Abnormal; Notable for the following:    WBC 11.0 (*)    Hemoglobin 10.9 (*)    HCT 35.3 (*)    MCH 24.8 (*)    RDW 16.5 (*)    All other components within normal limits  DIFFERENTIAL - Abnormal; Notable for the following:    Neutro Abs 8.1 (*)    Monocytes Absolute 1.2 (*)    All other components within normal limits  COMPREHENSIVE METABOLIC PANEL - Abnormal; Notable for the following:    Creatinine, Ser 1.39 (*)    GFR calc non Af Amer 50 (*)    GFR calc Af Amer 58 (*)    All other components within normal limits  PROTIME-INR  URINALYSIS, ROUTINE W REFLEX MICROSCOPIC  URINE CULTURE   Dg Chest 2 View  06/03/2011  *RADIOLOGY REPORT*  Clinical Data: Shortness of breath and chest pain.  CHEST - 2 VIEW  Comparison: Chest radiograph performed 04/14/2011  Findings: The lungs are well-aerated.  A rounded opacity is again noted at the lung bases on the lateral view, apparently stable from 11/17/2010.  Given the appearance and relative stability, this most likely reflects rounded atelectasis as on the prior studies. Associated small bilateral pleural effusions are seen.  Right-sided scarring is grossly unchanged in appearance.  The heart is mildly enlarged; the patient is status post median sternotomy.  A valve replacement is noted.  No acute osseous abnormalities are seen.  IMPRESSION: No acute superimposed airspace consolidation seen.   Persistent small bilateral pleural effusions and apparent stable rounded atelectasis at the lung bases; scarring at the right midlung zone is unchanged in appearance.  Original Report Authenticated By: Tonia Ghent, M.D.     1. Dysuria       MDM  Pt without signs of acute infection at  this time.  No pna , no UTI.  Will send off urine culture.  Pt has some soreness in his mouth.  Could be viral in nature.  Will dc home with close follow up        Celene Kras, MD 06/03/11 [phone]

## 2011-06-03 NOTE — Discharge Instructions (Signed)

## 2011-06-03 NOTE — ED Notes (Signed)
J. Knapp, MD at bedside.  

## 2011-06-04 LAB — URINE CULTURE: Colony Count: >=100000

## 2011-06-08 ENCOUNTER — Telehealth: Payer: Self-pay | Admitting: *Deleted

## 2011-06-08 NOTE — Telephone Encounter (Signed)
Several of his meds could be causing this but the most likely one is Lasix. Try stopping the Lasix for a month and see if he improves.

## 2011-06-08 NOTE — Telephone Encounter (Signed)
Pt's wife is calling to tell Dr. [name] pt's lips and mouth are extremely dry, and they have used all the OTC meds they have been recommended with no relief.  Would like Dr. [name] to send another med in for him, or does he think one of his meds may be causing this extreme dryness?  Both lips are cracked, and he is biting them while sleeping.

## 2011-06-08 NOTE — Progress Notes (Signed)
Quick Note:  Spoke with pt ______ 

## 2011-06-08 NOTE — Telephone Encounter (Signed)
Spoke with pt

## 2011-06-14 ENCOUNTER — Ambulatory Visit: Payer: Medicare Other | Admitting: Gastroenterology

## 2011-06-15 ENCOUNTER — Encounter: Payer: Self-pay | Admitting: Gastroenterology

## 2011-06-25 ENCOUNTER — Encounter (HOSPITAL_COMMUNITY): Payer: Self-pay | Admitting: Emergency Medicine

## 2011-06-25 ENCOUNTER — Telehealth: Payer: Self-pay | Admitting: Cardiology

## 2011-06-25 ENCOUNTER — Emergency Department (HOSPITAL_COMMUNITY): Payer: Medicare Other

## 2011-06-25 ENCOUNTER — Emergency Department (HOSPITAL_COMMUNITY)
Admission: EM | Admit: 2011-06-25 | Discharge: 2011-06-25 | Disposition: A | Discharge status: Home / Self Care | Payer: Medicare Other | Attending: Emergency Medicine | Admitting: Emergency Medicine

## 2011-06-25 DIAGNOSIS — I209 Angina pectoris, unspecified: Secondary | ICD-10-CM | POA: Insufficient documentation

## 2011-06-25 DIAGNOSIS — F3289 Other specified depressive episodes: Secondary | ICD-10-CM | POA: Insufficient documentation

## 2011-06-25 DIAGNOSIS — I4949 Other premature depolarization: Secondary | ICD-10-CM | POA: Insufficient documentation

## 2011-06-25 DIAGNOSIS — R9431 Abnormal electrocardiogram [ECG] [EKG]: Secondary | ICD-10-CM | POA: Insufficient documentation

## 2011-06-25 DIAGNOSIS — R079 Chest pain, unspecified: Secondary | ICD-10-CM | POA: Insufficient documentation

## 2011-06-25 DIAGNOSIS — Z86711 Personal history of pulmonary embolism: Secondary | ICD-10-CM | POA: Insufficient documentation

## 2011-06-25 DIAGNOSIS — I4891 Unspecified atrial fibrillation: Secondary | ICD-10-CM | POA: Insufficient documentation

## 2011-06-25 DIAGNOSIS — G20A1 Parkinson's disease without dyskinesia, without mention of fluctuations: Secondary | ICD-10-CM | POA: Insufficient documentation

## 2011-06-25 DIAGNOSIS — J45909 Unspecified asthma, uncomplicated: Secondary | ICD-10-CM | POA: Insufficient documentation

## 2011-06-25 DIAGNOSIS — R0602 Shortness of breath: Secondary | ICD-10-CM | POA: Insufficient documentation

## 2011-06-25 DIAGNOSIS — I440 Atrioventricular block, first degree: Secondary | ICD-10-CM | POA: Insufficient documentation

## 2011-06-25 DIAGNOSIS — K219 Gastro-esophageal reflux disease without esophagitis: Secondary | ICD-10-CM | POA: Insufficient documentation

## 2011-06-25 DIAGNOSIS — I2510 Atherosclerotic heart disease of native coronary artery without angina pectoris: Secondary | ICD-10-CM | POA: Insufficient documentation

## 2011-06-25 DIAGNOSIS — E785 Hyperlipidemia, unspecified: Secondary | ICD-10-CM | POA: Insufficient documentation

## 2011-06-25 DIAGNOSIS — F329 Major depressive disorder, single episode, unspecified: Secondary | ICD-10-CM | POA: Insufficient documentation

## 2011-06-25 DIAGNOSIS — G20 Parkinson's disease: Secondary | ICD-10-CM | POA: Insufficient documentation

## 2011-06-25 DIAGNOSIS — Z86718 Personal history of other venous thrombosis and embolism: Secondary | ICD-10-CM | POA: Insufficient documentation

## 2011-06-25 LAB — BASIC METABOLIC PANEL
CO2: 24 mEq/L (ref 19–32)
Calcium: 9.1 mg/dL (ref 8.4–10.5)
Creatinine, Ser: 1.26 mg/dL (ref 0.50–1.35)
GFR calc non Af Amer: 57 mL/min — ABNORMAL LOW (ref >90)
Glucose, Bld: 100 mg/dL — ABNORMAL HIGH (ref 70–99)

## 2011-06-25 LAB — CBC
MCH: 24.6 pg — ABNORMAL LOW (ref 26.0–34.0)
MCHC: 30.6 g/dL (ref 30.0–36.0)
MCV: 80.3 fL (ref 78.0–100.0)
Platelets: 236 10*3/uL (ref 150–400)
RBC: 4.27 MIL/uL (ref 4.22–5.81)

## 2011-06-25 LAB — POCT I-STAT TROPONIN I

## 2011-06-25 LAB — PRO B NATRIURETIC PEPTIDE: Pro B Natriuretic peptide (BNP): 519.7 pg/mL — ABNORMAL HIGH (ref 0–125)

## 2011-06-25 NOTE — Consult Note (Signed)
Cardiology Consult Note   Patient ID: [name] MRN: [id number], DOB/AGE: 1940/04/13   Admit date: 06/25/2011 Date of Consult: 06/25/2011   Primary Physician: Nelwyn Salisbury, MD, MD Primary Cardiologist: Olga Millers, MD  Pt. Profile: [name] is a 71yo male with PMHx significant for Parkinson's disease, CAD (s/p CABG x 2 in 07/11; LIMA-LAD, VG-OM), MR (s/p MV repair 22mm Edwards ring and oversewing of LA appendage in 07/11), PAF (not a good Coumadin candidate secondary to Parkinson's dz and gait instability), history of DVT/PE (>20 years ago), question of RV dysfunction (on echo 02/13) and HL who presents to [facility] ED today with chest pain.   Of note, the patient was admitted by the cardiology service recently (03/13) and ruled out for ACS. This was suspected to be secondary to associated palpitations and PAF, although this was not documented on admission. He was discharged on his home Amiodarone dose. On follow-up with Dr. Jens Som the following week, he remained in NSR and was stable without complaints at that time. Amio was decreased to 200mg  daily.  Home meds resumed.   Problem List: Past Medical History  Diagnosis Date  . Atrial fibrillation   . PE (pulmonary embolism)   . DVT (deep venous thrombosis)   . Meningioma   . BPH (benign prostatic hyperplasia)   . Personal history of colonic polyps   . Venereal wart   . Depression   . Asthma   . Chickenpox   . Thrombophlebitis   . GERD (gastroesophageal reflux disease)   . Parkinson disease   . Dementia   . CAD (coronary artery disease)   . Hyperlipidemia   . Angina   . Stricture and stenosis of esophagus   . Hiatal hernia   . Diverticulosis of colon (without mention of hemorrhage)     Past Surgical History  Procedure Date  . Inguinal hernia repair   . Tonsilectomy, adenoidectomy, bilateral myringotomy and tubes   . Varicose vein surgery     left leg  . Back surgery     herniated disc & remove bone spurs  .  Coronary artery bypass graft   . Mitral valve repair   . Umbilical hernia repair   . External ear surgery      Allergies:  Allergies  Allergen Reactions  . Ibuprofen     Stomach hurts    HPI:   He reports experiencing several episodes of chest pain a couple months ago (which is described above as a recent admission). Over the past three weeks, he notes increased episodes of sharp chest pain lasting ~ 45 minutes at a time, located substernally with associated "numbness all over" with associated shortness of breath, palpitations, blurry vision. No aggravating/alleviating factors. He does report increased LE edema over the last several weeks and increased DOE with light walking. His activity level has decreased over the past several months. He does endorse a new nonproductive cough. He and his wife inform me that his PCP has held his Lasix secondary to suspected polypharmacy as a cause for postural hypotension. He denies n/v, fevers, chills, lightheadedness, syncope, orthopnea, PND, sick contacts. No active bleeding. He denies weakness, imbalance, incoordination or slurred speech. [name] wife states that the R leg is more swollen than the left. No relation to exertion or meals.   Upon ED arrival, EKG reveals no evidence of ischemia. POC TnI WNL. BMET unremarkable. He does have a mild anemia H/H at 10.5/34.2; MCV WNL. CXR with small bilateral pleural effusions, RLL  airspace disease and rounded posterior atelectasis. Unchanged from prior radiograph last month.   Home Medications: Prior to Admission medications   Medication Sig Start Date End Date Taking? Authorizing Provider  amiodarone (PACERONE) 200 MG tablet Take 1 tablet (200 mg total) by mouth daily. 05/12/11  Yes Lewayne Bunting, MD  aspirin EC 81 MG tablet Take 81 mg by mouth daily. 04/15/11  Yes [name] G Barrett, PA  carbidopa-levodopa (SINEMET) 25-100 MG per tablet Take 1 tablet by mouth 2 (two) times daily. 1 tablet at breakfast  and lunch 03/05/11  Yes Nelwyn Salisbury, MD  clonazePAM (KLONOPIN) 0.5 MG tablet Take 0.25 mg by mouth 4 (four) times daily as needed. For spasms; at meals and bedtime 09/29/10  Yes Nelwyn Salisbury, MD  Multiple Vitamin (MULITIVITAMIN WITH MINERALS) TABS Take 1 tablet by mouth daily.   Yes Historical Provider, MD  polyethylene glycol (MIRALAX / GLYCOLAX) packet Take 17 g by mouth daily as needed. For constipation    Yes Historical Provider, MD  promethazine (PHENERGAN) 25 MG tablet Take 25 mg by mouth every 6 (six) hours as needed. For nausea   Yes Historical Provider, MD  PROTONIX 40 MG tablet TAKE 1 TABLET TWICE A DAY 05/23/11  Yes Nelwyn Salisbury, MD  sertraline (ZOLOFT) 100 MG tablet Take 100 mg by mouth every evening.    Yes Historical Provider, MD  Tamsulosin HCl (FLOMAX) 0.4 MG CAPS Take 0.4 mg by mouth daily after breakfast.   Yes Historical Provider, MD  traZODone (DESYREL) 150 MG tablet Take 150 mg by mouth at bedtime.   Yes Historical Provider, MD    Inpatient Medications:      (Not in a hospital admission)  Family History  Problem Relation Age of Onset  . Arthritis    . Diabetes    . Stroke    . Heart disease Mother      History   Social History  . Marital Status: Married    Spouse Name: N/A    Number of Children: 2  . Years of Education: N/A   Occupational History  .      Retired   Social History Main Topics  . Smoking status: Never Smoker   . Smokeless tobacco: Never Used  . Alcohol Use: No  . Drug Use: No  . Sexually Active: No   Other Topics Concern  . Not on file   Social History Narrative  . No narrative on file     Review of Systems: General: negative for chills, fever, night sweats or weight changes.  Cardiovascular: positive for chest pain, dyspnea on exertion, edema, palpitations, shortness of breath, negative for orthopnea, PND Dermatological: negative for rash Respiratory: positive for nonproductive cough, negative for wheezing Urologic: negative  for hematuria Abdominal: negative for nausea, vomiting, diarrhea, bright red blood per rectum, melena, or hematemesis Neurologic: positive for blurry vision and dizziness, negative for syncope All other systems reviewed and are otherwise negative except as noted above.  Physical Exam: Blood pressure 123/68, pulse 73, temperature 97.5 F (36.4 C), temperature source Oral, resp. rate 14, SpO2 98.00%.    General: Elderly, well developed, well nourished, in no acute distress. Head: Normocephalic, atraumatic, sclera non-icteric, no xanthomas Neck: Negative for carotid bruits. JVD not elevated. Lungs: Decreased breath sounds at bilateral bases. No wheezing, rales or rhonchi appreciated. Breathing is unlabored. Heart: RRR with S1 S2. III/VI systolic murmur at LLSB radiating to axilla. No rubs or gallops appreciated. Abdomen: Soft, non-tender, non-distended with normoactive  bowel sounds. No hepatomegaly. No rebound/guarding. No obvious abdominal masses. Msk:  Strength and tone appears normal for age. Extremities: No clubbing, cyanosis or edema. No evidence of LE edema or erythema. No tenderness on palpation.  Distal pedal pulses are 2+ and equal bilaterally. Neuro: Alert and oriented X 3. Moves all extremities spontaneously. Psych:  Responds to questions in a delayed manner with a flat affect.  Labs: Recent Labs  Basename 06/25/11 1009   WBC 6.3   HGB 10.5*   HCT 34.3*   MCV 80.3   PLT 236   Lab 06/25/11 1009  NA 139  K 4.3  CL 104  CO2 24  BUN 9  CREATININE 1.26  CALCIUM 9.1  PROT --  BILITOT --  ALKPHOS --  ALT --  AST --  AMYLASE --  LIPASE --  GLUCOSE 100*   Radiology/Studies: Dg Chest 2 View  06/25/2011  *RADIOLOGY REPORT*  Clinical Data: Chest pain.  CHEST - 2 VIEW  Comparison: 06/03/2011  Findings: There are small bilateral pleural effusions, stable. Right lower lobe airspace disease again noted, unchanged. Stable rounded atelectasis posteriorly in the lung bases. Prior  median sternotomy and valve replacement.  IMPRESSION: No significant change in small bilateral pleural effusions, right lower lung airspace disease and rounded atelectasis posteriorly on the lateral view.  Original Report Authenticated By: Cyndie Chime, M.D.   Dg Chest 2 View  06/03/2011  *RADIOLOGY REPORT*  Clinical Data: Shortness of breath and chest pain.  CHEST - 2 VIEW  Comparison: Chest radiograph performed 04/14/2011  Findings: The lungs are well-aerated.  A rounded opacity is again noted at the lung bases on the lateral view, apparently stable from 11/17/2010.  Given the appearance and relative stability, this most likely reflects rounded atelectasis as on the prior studies. Associated small bilateral pleural effusions are seen.  Right-sided scarring is grossly unchanged in appearance.  The heart is mildly enlarged; the patient is status post median sternotomy.  A valve replacement is noted.  No acute osseous abnormalities are seen.  IMPRESSION: No acute superimposed airspace consolidation seen.  Persistent small bilateral pleural effusions and apparent stable rounded atelectasis at the lung bases; scarring at the right midlung zone is unchanged in appearance.  Original Report Authenticated By: Tonia Ghent, M.D.    EKG: NSR; frequent PVCs; Q waves II, III; no ST-T wave changes  ASSESSMENT AND PLAN:   [name] is a 71yo male with PMHx significant for Parkinson's disease, CAD (s/p CABG x 2 in 07/11; LIMA-LAD, VG-OM), MR (s/p MV repair 22mm Edwards ring and oversewing of LA appendage in 07/11), PAF (not a good Coumadin candidate secondary to Parkinson's dz and gait instability), history of DVT/PE (>20 years ago), question of RV dysfunction (on echo 02/13) and HL who presents to [facility] ED today with chest pain.   1. Chest pain- atypical for angina. The patient describes his episodes of chest pain as substernal, sharp without radiation and unprovoked by exertion. Although, they have been increasing  in frequency and severity. He does note a decrease in activity level over the past few months and increased DOE. On exam, there is a murmur consistent with MR. No evidence of peripheral edema. There are decreased breath sounds at each lung base. Initial EKG without evidence of ishemia. Cardiac biomarkers WNL. CXR unchanged from prior studies. Afebrile without a leukocytosis. A recent 2D echo notes some mild MR, RV could not be assessed with questionable RV dilatation and dysfunction. No evidence of hypervolemia however.  The patient had a similar admission 2 months ago and ruled out. His symptoms are similar in description from then. No evidence of ischemia or atrial fibrillation today. Low-suspicion for PE.   - Will plan for NTG SL PRN as I do not see any prescribed on his home meds  - Will add an antianginal in Imdur 30mg  daily  - Resume already schedule follow-up appointment with Dr. Jens Som  2. PAF- NSR currently. No evidence of conversion to atrial fibrillation on telemetry. Deemed a poor Coumadin candidate secondary to Parkinson's disease and gait instability.   - Continue ASA  3. CAD- stable  - Continue ASA, will add antianginal as above  - Statin? Was on pravastatin as recently as last month. LFTs then WNL. Would resume.  4. Hyperlipidemia  - Resume pravastatin 40mg  daily   Signed, R. Hurman Horn, PA-C 06/25/2011, 11:15 AM   I have taken a history, reviewed medications, allergies, PMH, SH, FH, and reviewed ROS and examined the patient.  I agree with the assessment and plan.  Xzavion Doswell C. Daleen Squibb, MD, [facility] Pager:  [phone]

## 2011-06-25 NOTE — Telephone Encounter (Signed)
Patient wife Pamelia Hoit request return call [phone]  Patient has been experiencing chest tightness and fatigue during the night.  Patient double bypass mitro valve repair a year ago, and problems with A-Fib.  No SOB or dizziness at this time.  Patient would like to be seen today.

## 2011-06-25 NOTE — ED Provider Notes (Signed)
History     CSN: [id number]  Arrival date & time 06/25/11  1324   First MD Initiated Contact with Patient 06/25/11 1000      Chief Complaint  Patient presents with  . Chest Pain  . Shortness of Breath    (Consider location/radiation/quality/duration/timing/severity/associated sxs/prior treatment) HPI Comments: Patient is a 71 year old man who has a history of coronary disease and also mitral valve disease. He had coronary artery bypass and mitral valve replacement last summer. He developed tightness in his chest last night which lasted for about 45 minutes. He also had associated shortness of breath. This morning he again had chest tightness and felt weak. The wife says he has a history of atrial fibrillation.  Patient is a 71 y.o. male presenting with chest pain.  Chest Pain The chest pain began yesterday. Chest pain occurs constantly. The chest pain is unchanged. At its most intense, the pain is at 5/10. The pain is currently at 1/10. The severity of the pain is mild. The quality of the pain is described as tightness. The pain does not radiate. Exacerbated by: Nothing. Primary symptoms include shortness of breath. Pertinent negatives for primary symptoms include no fever.  Associated symptoms include lower extremity edema.  Pertinent negatives for associated symptoms include no diaphoresis. Treatments tried: He took his regular medicines this morning. Risk factors include being elderly, sedentary lifestyle and male gender (Known coronary artery disease.).  His past medical history is significant for CAD.  Procedure history is positive for cardiac catheterization.     Past Medical History  Diagnosis Date  . Atrial fibrillation   . PE (pulmonary embolism)   . DVT (deep venous thrombosis)   . Meningioma   . BPH (benign prostatic hyperplasia)   . Personal history of colonic polyps   . Venereal wart   . Depression   . Asthma   . Chickenpox   . Thrombophlebitis   . GERD  (gastroesophageal reflux disease)   . Parkinson disease   . Dementia   . CAD (coronary artery disease)   . Hyperlipidemia   . Angina   . Stricture and stenosis of esophagus   . Hiatal hernia   . Diverticulosis of colon (without mention of hemorrhage)     Past Surgical History  Procedure Date  . Inguinal hernia repair   . Tonsilectomy, adenoidectomy, bilateral myringotomy and tubes   . Varicose vein surgery     left leg  . Back surgery     herniated disc & remove bone spurs  . Coronary artery bypass graft   . Mitral valve repair   . Umbilical hernia repair   . External ear surgery     Family History  Problem Relation Age of Onset  . Arthritis    . Diabetes    . Stroke    . Heart disease Mother     History  Substance Use Topics  . Smoking status: Never Smoker   . Smokeless tobacco: Never Used  . Alcohol Use: No      Review of Systems  Constitutional: Negative for fever, chills and diaphoresis.  HENT: Negative.   Eyes: Negative.   Respiratory: Positive for shortness of breath.   Cardiovascular: Positive for chest pain.  Gastrointestinal: Negative.   Genitourinary: Negative.   Musculoskeletal: Negative.   Neurological: Negative.   Psychiatric/Behavioral: Negative.     Allergies  Ibuprofen  Home Medications   Current Outpatient Rx  Name Route Sig Dispense Refill  . AMIODARONE HCL 200 MG PO  TABS Oral Take 1 tablet (200 mg total) by mouth daily.    . ASPIRIN EC 81 MG PO TBEC Oral Take 81 mg by mouth daily.    Marland Kitchen CARBIDOPA-LEVODOPA 25-100 MG PO TABS Oral Take 1 tablet by mouth 2 (two) times daily. 1 tablet at breakfast and lunch    . CLONAZEPAM 0.5 MG PO TABS Oral Take 0.25 mg by mouth 4 (four) times daily as needed. For spasms; at meals and bedtime    . ADULT MULTIVITAMIN W/MINERALS CH Oral Take 1 tablet by mouth daily.    Marland Kitchen POLYETHYLENE GLYCOL 3350 PO PACK Oral Take 17 g by mouth daily as needed. For constipation     . PROMETHAZINE HCL 25 MG PO TABS Oral  Take 25 mg by mouth every 6 (six) hours as needed. For nausea    . PROTONIX 40 MG PO TBEC  TAKE 1 TABLET TWICE A DAY 60 each 11  . SERTRALINE HCL 100 MG PO TABS Oral Take 100 mg by mouth every evening.     Marland Kitchen TAMSULOSIN HCL 0.4 MG PO CAPS Oral Take 0.4 mg by mouth daily after breakfast.    . TRAZODONE HCL 150 MG PO TABS Oral Take 150 mg by mouth at bedtime.      BP 124/74  Pulse 79  Temp(Src) 97.5 F (36.4 C) (Oral)  Resp 22  SpO2 97%  Physical Exam  Nursing note and vitals reviewed. Constitutional: He is oriented to person, place, and time. He appears well-developed and well-nourished. He appears distressed (in mild distress complaining of chest tightness.).       Appears anxious.   HENT:  Head: Normocephalic and atraumatic.  Right Ear: External ear normal.  Left Ear: External ear normal.  Mouth/Throat: Oropharynx is clear and moist.  Eyes: Conjunctivae and EOM are normal. Pupils are equal, round, and reactive to light.  Neck: Normal range of motion. Neck supple.  Cardiovascular: Normal rate and regular rhythm.        Prosthetic heart sounds heard best at LSB.  Pulmonary/Chest: Effort normal and breath sounds normal.  Abdominal: Soft. Bowel sounds are normal.  Musculoskeletal: Edema: trace edema at ankles.  Neurological: He is alert and oriented to person, place, and time.       No sensory or motor deficits.  Skin: Skin is warm and dry.  Psychiatric: He has a normal mood and affect. His behavior is normal.    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    ST/T Wave abnormalities: normal  Conduction Disutrbances:none  Narrative  Interpretation: Abnormal EKG.  Old EKG Reviewed: changes noted--Frequent PVC's are new.   10:35 AM Patient seen, physical exam performed. Old charts were reviewed. EKG showed frequent PVCs, but no acute changes. Laboratory tests were ordered. A call was placed to [facility] cardiology, and they will be down to see the patient.  Results for orders placed during the hospital encounter of 06/25/11  CBC      Component Value Range   WBC 6.3  4.0 - 10.5 (K/uL)   RBC 4.27  4.22 - 5.81 (MIL/uL)   Hemoglobin 10.5 (*) 13.0 - 17.0 (g/dL)  HCT 34.3 (*) 39.0 - 52.0 (%)   MCV 80.3  78.0 - 100.0 (fL)   MCH 24.6 (*) 26.0 - 34.0 (pg)   MCHC 30.6  30.0 - 36.0 (g/dL)   RDW 16.1 (*) 09.6 - 15.5 (%)   Platelets 236  150 - 400 (K/uL)  BASIC METABOLIC PANEL      Component Value Range   Sodium 139  135 - 145 (mEq/L)   Potassium 4.3  3.5 - 5.1 (mEq/L)   Chloride 104  96 - 112 (mEq/L)   CO2 24  19 - 32 (mEq/L)   Glucose, Bld 100 (*) 70 - 99 (mg/dL)   BUN 9  6 - 23 (mg/dL)   Creatinine, Ser 0.45  0.50 - 1.35 (mg/dL)   Calcium 9.1  8.4 - 40.9 (mg/dL)   GFR calc non Af Amer 57 (*) >90 (mL/min)   GFR calc Af Amer 65 (*) >90 (mL/min)  PRO B NATRIURETIC PEPTIDE      Component Value Range   Pro B Natriuretic peptide (BNP) 519.7 (*) 0 - 125 (pg/mL)  PROTIME-INR      Component Value Range   Prothrombin Time 14.9  11.6 - 15.2 (seconds)   INR 1.15  0.00 - 1.49   POCT I-STAT TROPONIN I      Component Value Range   Troponin i, poc 0.02  0.00 - 0.08 (ng/mL)   Comment 3            Dg Chest 2 View  06/25/2011  *RADIOLOGY REPORT*  Clinical Data: Chest pain.  CHEST - 2 VIEW  Comparison: 06/03/2011  Findings: There are small bilateral pleural effusions, stable. Right lower lobe airspace disease again noted, unchanged. Stable rounded atelectasis posteriorly in the lung bases. Prior median sternotomy and valve replacement.  IMPRESSION: No significant change in small bilateral pleural effusions, right lower lung airspace  disease and rounded atelectasis posteriorly on the lateral view.  Original Report Authenticated By: Cyndie Chime, M.D.   Lab tests were negative.  Pt seen by [facility] Cardiology, who advised pt to take Imdur 30 mg po qAM.  Released.    1. Chest pain   2. Angina pectoris           Carleene Cooper III, MD 06/28/11 1215

## 2011-06-25 NOTE — ED Notes (Signed)
MD at bedside. 

## 2011-06-25 NOTE — Telephone Encounter (Signed)
Wife verbalized understanding and he will go hospital

## 2011-06-25 NOTE — ED Notes (Signed)
Pt received and signed paper dc instructions, left with teaching and in NAD

## 2011-06-25 NOTE — Telephone Encounter (Signed)
Chest tightness all night with little rest per wife.  Per wife patient could not tel if heart was beating fast.  Episode started last night 8:30, tightness in chest, arm and legs got numb, headache, and shortness of breath.  This is about the 3rd time this has happened in the last couple of months.  When this happened last night wife stated he was shaking almost like a seizure.  He is resting now but still having chest tightness.  Discussed with Victorio Palm. RN with Dr Jens Som and with patients history and chest tightness all night advised wife patient needs to go to emergency department.

## 2011-06-25 NOTE — ED Notes (Signed)
Pt c/o chest tightness this am and SOB with palpitations last night; pt sts hx of afib and unsure if that was what happened last night; pulse feels regular and strong at present

## 2011-06-28 NOTE — Discharge Instructions (Signed)
Angina Angina is chest discomfort caused by lack of oxygen to the heart muscle. It is a warning sign that there is a blood flow problem to your heart. Angina is referred to as either stable or unstable. Stable angina often happens with the same kind of activity, lasts a few minutes, and feels the same each time. Unstable angina has no pattern, no warning, lasts longer, and is more serious. Unstable angina might predict a heart attack. HOME CARE   Understand how to take your medicine and what side effects to expect.   Do not stop the medicines.   Do not change how much you take (dosage) on your own.   Write down any side effects. Tell your doctor what they are.   Mild exercise may help. Start exercising only as told by your doctor.   You can still have a sexual relationship if it does not cause angina. Tell your doctor if it does.   Stop smoking. Do not use gum or patches that help people quit smoking until you check with your doctor.   Lose weight if you are overweight. Eat a heart-healthy diet that is low in fat and salt.   Keep all follow-up visits with your doctor. This is important!  GET HELP RIGHT AWAY IF:   Your angina seems to happen more often or lasts longer.   You are having side effects from your medicine.   Your chest pain spreads to the arms, back, neck, or jaw (especially if the pain is crushing or pressure-like).   You are sweating, feel sick to your stomach (nauseous), or have shortness of breath.   You have an attack that does not get better after rest or taking medicine.   You wake from sleep with chest pain.   You feel dizzy, faint, or feel very tired (fatigued).   You have chest pain that is different from your usual angina.  Any of these problems may be a sign of a serious problem that is an emergency. Do not wait to see if the problems will go away. Get medical help right away. Call your local emergency services (911 in [place] Do not drive yourself to the  hospital. MAKE SURE YOU:   Understand these instructions.   Will watch your condition.   Will get help right away if you are not doing well or get worse.  Document Released: 07/28/2007 Document Revised: 01/28/2011 Document Reviewed: 07/28/2007 [employer] Patient Information 2012 [employer]. 

## 2011-07-02 ENCOUNTER — Telehealth: Payer: Self-pay | Admitting: *Deleted

## 2011-07-02 NOTE — Telephone Encounter (Signed)
Pt has had an extremely dry mouth and cannot sleep for 3 nights, itchy eyes and drinking large amounts of water to try to help.  Please give them suggestions.

## 2011-07-02 NOTE — Telephone Encounter (Signed)
I spoke with pt and went over below information. 

## 2011-07-02 NOTE — Telephone Encounter (Signed)
This is probably a side effect of his meds. He had recent labs in the ED which showed he does not have diabetes. Tell him to drink plenty of water and see me next week to figure this out

## 2011-07-04 ENCOUNTER — Emergency Department (INDEPENDENT_AMBULATORY_CARE_PROVIDER_SITE_OTHER): Payer: Medicare Other

## 2011-07-04 ENCOUNTER — Encounter (HOSPITAL_BASED_OUTPATIENT_CLINIC_OR_DEPARTMENT_OTHER): Payer: Self-pay | Admitting: *Deleted

## 2011-07-04 ENCOUNTER — Emergency Department (HOSPITAL_BASED_OUTPATIENT_CLINIC_OR_DEPARTMENT_OTHER)
Admission: EM | Admit: 2011-07-04 | Discharge: 2011-07-04 | Disposition: A | Discharge status: Home / Self Care | Payer: Medicare Other | Attending: Emergency Medicine | Admitting: Emergency Medicine

## 2011-07-04 DIAGNOSIS — R197 Diarrhea, unspecified: Secondary | ICD-10-CM | POA: Insufficient documentation

## 2011-07-04 DIAGNOSIS — R059 Cough, unspecified: Secondary | ICD-10-CM | POA: Insufficient documentation

## 2011-07-04 DIAGNOSIS — K117 Disturbances of salivary secretion: Secondary | ICD-10-CM

## 2011-07-04 DIAGNOSIS — R51 Headache: Secondary | ICD-10-CM | POA: Insufficient documentation

## 2011-07-04 DIAGNOSIS — I4949 Other premature depolarization: Secondary | ICD-10-CM | POA: Insufficient documentation

## 2011-07-04 DIAGNOSIS — R05 Cough: Secondary | ICD-10-CM | POA: Insufficient documentation

## 2011-07-04 DIAGNOSIS — I517 Cardiomegaly: Secondary | ICD-10-CM

## 2011-07-04 DIAGNOSIS — Z79899 Other long term (current) drug therapy: Secondary | ICD-10-CM | POA: Insufficient documentation

## 2011-07-04 DIAGNOSIS — J90 Pleural effusion, not elsewhere classified: Secondary | ICD-10-CM

## 2011-07-04 DIAGNOSIS — I25810 Atherosclerosis of coronary artery bypass graft(s) without angina pectoris: Secondary | ICD-10-CM | POA: Insufficient documentation

## 2011-07-04 DIAGNOSIS — I493 Ventricular premature depolarization: Secondary | ICD-10-CM

## 2011-07-04 DIAGNOSIS — R509 Fever, unspecified: Secondary | ICD-10-CM | POA: Insufficient documentation

## 2011-07-04 DIAGNOSIS — R0602 Shortness of breath: Secondary | ICD-10-CM | POA: Insufficient documentation

## 2011-07-04 DIAGNOSIS — G20 Parkinson's disease: Secondary | ICD-10-CM | POA: Insufficient documentation

## 2011-07-04 DIAGNOSIS — G20A1 Parkinson's disease without dyskinesia, without mention of fluctuations: Secondary | ICD-10-CM | POA: Insufficient documentation

## 2011-07-04 DIAGNOSIS — R682 Dry mouth, unspecified: Secondary | ICD-10-CM

## 2011-07-04 DIAGNOSIS — R112 Nausea with vomiting, unspecified: Secondary | ICD-10-CM | POA: Insufficient documentation

## 2011-07-04 DIAGNOSIS — R350 Frequency of micturition: Secondary | ICD-10-CM | POA: Insufficient documentation

## 2011-07-04 DIAGNOSIS — R918 Other nonspecific abnormal finding of lung field: Secondary | ICD-10-CM

## 2011-07-04 LAB — CBC
MCH: 25.5 pg — ABNORMAL LOW (ref 26.0–34.0)
MCHC: 31.8 g/dL (ref 30.0–36.0)
Platelets: 214 10*3/uL (ref 150–400)
RDW: 16.9 % — ABNORMAL HIGH (ref 11.5–15.5)

## 2011-07-04 LAB — URINALYSIS, ROUTINE W REFLEX MICROSCOPIC
Glucose, UA: NEGATIVE mg/dL
Ketones, ur: NEGATIVE mg/dL
pH: 7 (ref 5.0–8.0)

## 2011-07-04 LAB — COMPREHENSIVE METABOLIC PANEL
ALT: 15 U/L (ref 0–53)
Albumin: 4.1 g/dL (ref 3.5–5.2)
Alkaline Phosphatase: 68 U/L (ref 39–117)
BUN: 11 mg/dL (ref 6–23)
Potassium: 4.8 mEq/L (ref 3.5–5.1)
Sodium: 141 mEq/L (ref 135–145)
Total Protein: 7.4 g/dL (ref 6.0–8.3)

## 2011-07-04 LAB — DIFFERENTIAL
Basophils Absolute: 0 10*3/uL (ref 0.0–0.1)
Basophils Relative: 0 % (ref 0–1)
Eosinophils Absolute: 0.1 10*3/uL (ref 0.0–0.7)
Neutrophils Relative %: 71 % (ref 43–77)

## 2011-07-04 LAB — URINE MICROSCOPIC-ADD ON

## 2011-07-04 MED ORDER — LORAZEPAM 2 MG/ML IJ SOLN
0.5000 mg | Freq: Once | INTRAMUSCULAR | Status: AC
  Administered 2011-07-04: 0.5 mg via INTRAVENOUS
  Filled 2011-07-04: qty 1

## 2011-07-04 MED ORDER — SODIUM CHLORIDE 0.9 % IV BOLUS (SEPSIS)
1000.0000 mL | Freq: Once | INTRAVENOUS | Status: AC
  Administered 2011-07-04: 1000 mL via INTRAVENOUS

## 2011-07-04 NOTE — Discharge Instructions (Signed)
Please call your doctor tomorrow to discuss medication changes. Return if he is worse anytime.

## 2011-07-04 NOTE — ED Notes (Signed)
Wife states pt has had dry mouth for a couple of weeks. Stopped 3 meds last week. Here on 04-28-2011 Dx'd with UTI.Marland Kitchen Denies CP. Fever off and on x 2 days. Taken to ED 7. Placed on monitor. EKG done.

## 2011-07-04 NOTE — ED Provider Notes (Addendum)
History   This chart was scribed for Hilario Quarry, MD by Charolett Bumpers . The patient was seen in room MH07/MH07.    CSN: [id number]  Arrival date & time 07/04/11  1718   First MD Initiated Contact with Patient 07/04/11 1749      Chief Complaint  Patient presents with  . Medication Reaction    (Consider location/radiation/quality/duration/timing/severity/associated sxs/prior treatment) HPI [name] is a 71 y.o. male who presents to the Emergency Department complaining of constant, moderate dry mouth and dry eyes for the past 4 days. Wife reports that the dry mouth is painful for the patient. Wife states that the patient was taken off of Restoril, Thorazine and Lasix by Dr. [name] about 5 days ago. Wife states that the patient was taken off lasix due to increased frequency and the Restoril and Thorazine for the dry mouth and eyes. Wife reports a h/o Parkinson's, A-fib, DVT, CAD, Hyperlipidemia. Wife reports a surgical h/o Mitral valve repair and coronary artery bypass graft. Wife denies any h/o MI. Patient denies smoking. Wife also reports associated fever (not measured), cough, SOB, headaches, n/v/d, and increased frequency in urination that started recently as well with the onset of the other symptoms.   Past Medical History  Diagnosis Date  . Atrial fibrillation   . PE (pulmonary embolism)   . DVT (deep venous thrombosis)   . Meningioma   . BPH (benign prostatic hyperplasia)   . Personal history of colonic polyps   . Venereal wart   . Depression   . Asthma   . Chickenpox   . Thrombophlebitis   . GERD (gastroesophageal reflux disease)   . Parkinson disease   . Dementia   . CAD (coronary artery disease)   . Hyperlipidemia   . Angina   . Stricture and stenosis of esophagus   . Hiatal hernia   . Diverticulosis of colon (without mention of hemorrhage)     Past Surgical History  Procedure Date  . Inguinal hernia repair   . Tonsilectomy, adenoidectomy,  bilateral myringotomy and tubes   . Varicose vein surgery     left leg  . Back surgery     herniated disc & remove bone spurs  . Coronary artery bypass graft   . Mitral valve repair   . Umbilical hernia repair   . External ear surgery     Family History  Problem Relation Age of Onset  . Arthritis    . Diabetes    . Stroke    . Heart disease Mother     History  Substance Use Topics  . Smoking status: Never Smoker   . Smokeless tobacco: Never Used  . Alcohol Use: No      Review of Systems  Constitutional: Positive for fever.  HENT:       Dry mouth   Eyes:       Dry eyes   Respiratory: Positive for cough and shortness of breath.   Cardiovascular: Negative for leg swelling.  Gastrointestinal: Positive for nausea, vomiting and diarrhea.  Genitourinary: Positive for frequency.  Skin: Negative for rash.  Neurological: Positive for headaches.  All other systems reviewed and are negative.    Allergies  Ibuprofen  Home Medications   Current Outpatient Rx  Name Route Sig Dispense Refill  . AMIODARONE HCL 200 MG PO TABS Oral Take 1 tablet (200 mg total) by mouth daily.    . ASPIRIN EC 81 MG PO TBEC Oral Take 81 mg by mouth  daily.    Marland Kitchen CARBIDOPA-LEVODOPA 25-100 MG PO TABS Oral Take 1 tablet by mouth 2 (two) times daily. 1 tablet at breakfast and lunch    . CHLORPROMAZINE HCL 50 MG PO TABS Oral Take 50 mg by mouth 3 (three) times daily.    Marland Kitchen CLONAZEPAM 0.5 MG PO TABS Oral Take 0.25 mg by mouth 4 (four) times daily as needed. For spasms; at meals and bedtime    . ADULT MULTIVITAMIN W/MINERALS CH Oral Take 1 tablet by mouth daily.    Marland Kitchen PRAVASTATIN SODIUM 40 MG PO TABS Oral Take 40 mg by mouth daily.    Marland Kitchen PROTONIX 40 MG PO TBEC  TAKE 1 TABLET TWICE A DAY 60 each 11  . TAMSULOSIN HCL 0.4 MG PO CAPS Oral Take 0.4 mg by mouth daily after breakfast.    . TRAZODONE HCL 150 MG PO TABS Oral Take 150 mg by mouth at bedtime. Patient is using 75 milligrams of this medication at  bedtime.     Marland Kitchen POLYETHYLENE GLYCOL 3350 PO PACK Oral Take 17 g by mouth daily as needed. For constipation     . PROMETHAZINE HCL 25 MG PO TABS Oral Take 25 mg by mouth every 6 (six) hours as needed. For nausea    . SERTRALINE HCL 100 MG PO TABS Oral Take 100 mg by mouth every evening.       BP 122/61  Pulse 80  Temp(Src) 97.6 F (36.4 C) (Oral)  Resp 28  Ht 6\' 3"  (1.905 m)  Wt 240 lb (108.863 kg)  BMI 30.00 kg/m2  SpO2 100%  Physical Exam  Nursing note and vitals reviewed. Constitutional: He is oriented to person, place, and time. He appears well-developed and well-nourished. No distress.  HENT:  Head: Normocephalic and atraumatic.  Right Ear: External ear normal.  Left Ear: External ear normal.  Nose: Nose normal.  Eyes: EOM are normal. Pupils are equal, round, and reactive to light.  Neck: Normal range of motion. Neck supple. No tracheal deviation present.  Cardiovascular: Normal rate, regular rhythm and normal heart sounds.   Pulmonary/Chest: Effort normal and breath sounds normal. No respiratory distress.  Abdominal: Soft. Bowel sounds are normal. He exhibits no distension. There is no tenderness.  Musculoskeletal: Normal range of motion. He exhibits no edema.  Neurological: He is alert and oriented to person, place, and time. He has normal strength. No sensory deficit.       Strength 5/5.   Skin: Skin is warm and dry.  Psychiatric: He has a normal mood and affect. His behavior is normal.    ED Course  Procedures (including critical care time)  DIAGNOSTIC STUDIES: Oxygen Saturation is 100% on room air, normal by my interpretation.    COORDINATION OF CARE:  1758: Discussed planned course of treatment with the patient and wife who are agreeable at this time.  1815: Medication Orders: Sodium Chloride 0.9% bolus 1,000 mL-once.   Results for orders placed during the hospital encounter of 07/04/11  CBC      Component Value Range   WBC 7.5  4.0 - 10.5 (K/uL)   RBC  4.20 (*) 4.22 - 5.81 (MIL/uL)   Hemoglobin 10.7 (*) 13.0 - 17.0 (g/dL)   HCT 21.3 (*) 08.6 - 52.0 (%)   MCV 80.0  78.0 - 100.0 (fL)   MCH 25.5 (*) 26.0 - 34.0 (pg)   MCHC 31.8  30.0 - 36.0 (g/dL)   RDW 57.8 (*) 46.9 - 15.5 (%)   Platelets 214  150 - 400 (K/uL)  DIFFERENTIAL      Component Value Range   Neutrophils Relative 71  43 - 77 (%)   Neutro Abs 5.3  1.7 - 7.7 (K/uL)   Lymphocytes Relative 18  12 - 46 (%)   Lymphs Abs 1.4  0.7 - 4.0 (K/uL)   Monocytes Relative 9  3 - 12 (%)   Monocytes Absolute 0.7  0.1 - 1.0 (K/uL)   Eosinophils Relative 2  0 - 5 (%)   Eosinophils Absolute 0.1  0.0 - 0.7 (K/uL)   Basophils Relative 0  0 - 1 (%)   Basophils Absolute 0.0  0.0 - 0.1 (K/uL)  COMPREHENSIVE METABOLIC PANEL      Component Value Range   Sodium 141  135 - 145 (mEq/L)   Potassium 4.8  3.5 - 5.1 (mEq/L)   Chloride 106  96 - 112 (mEq/L)   CO2 27  19 - 32 (mEq/L)   Glucose, Bld 116 (*) 70 - 99 (mg/dL)   BUN 11  6 - 23 (mg/dL)   Creatinine, Ser 8.29 (*) 0.50 - 1.35 (mg/dL)   Calcium 9.6  8.4 - 56.2 (mg/dL)   Total Protein 7.4  6.0 - 8.3 (g/dL)   Albumin 4.1  3.5 - 5.2 (g/dL)   AST 14  0 - 37 (U/L)   ALT 15  0 - 53 (U/L)   Alkaline Phosphatase 68  39 - 117 (U/L)   Total Bilirubin 0.4  0.3 - 1.2 (mg/dL)   GFR calc non Af Amer 45 (*) >90 (mL/min)   GFR calc Af Amer 53 (*) >90 (mL/min)  URINALYSIS, ROUTINE W REFLEX MICROSCOPIC      Component Value Range   Color, Urine YELLOW  YELLOW    APPearance CLEAR  CLEAR    Specific Gravity, Urine 1.011  1.005 - 1.030    pH 7.0  5.0 - 8.0    Glucose, UA NEGATIVE  NEGATIVE (mg/dL)   Hgb urine dipstick NEGATIVE  NEGATIVE    Bilirubin Urine NEGATIVE  NEGATIVE    Ketones, ur NEGATIVE  NEGATIVE (mg/dL)   Protein, ur NEGATIVE  NEGATIVE (mg/dL)   Urobilinogen, UA 0.2  0.0 - 1.0 (mg/dL)   Nitrite NEGATIVE  NEGATIVE    Leukocytes, UA TRACE (*) NEGATIVE   URINE MICROSCOPIC-ADD ON      Component Value Range   Squamous Epithelial / LPF FEW (*)  RARE    WBC, UA 0-2  <3 (WBC/hpf)   RBC / HPF 0-2  <3 (RBC/hpf)   Bacteria, UA FEW (*) RARE    Dg Chest 2 View  07/04/2011  *RADIOLOGY REPORT*  Clinical Data: Dry mouth and dry size.  Cough.  Shortness of breath.  CHEST - 2 VIEW  Comparison: 06/25/2011  Findings: Mitral valve prosthesis and prior coronary bypass grafting noted.  The patient is rotated to the right on today's exam, resulting in reduced diagnostic sensitivity and specificity.   Chronic blunted left costophrenic angle noted with stable bibasilar scarring. Small right pleural effusion noted posteriorly.  Peripheral chronic opacity in the left lung base has been ascribed to rounded atelectasis.  Mild cardiomegaly is present without overt edema.  IMPRESSION:  1.  Mild cardiomegaly and small bilateral pleural effusions with a chronic primarily linear opacities in the lung bases favoring scarring and possibly associated rounded atelectasis.  Original Report Authenticated By: Dellia Cloud, M.D.     No diagnosis found.    MDM   Results for orders placed during  the hospital encounter of 07/04/11  CBC      Component Value Range   WBC 7.5  4.0 - 10.5 (K/uL)   RBC 4.20 (*) 4.22 - 5.81 (MIL/uL)   Hemoglobin 10.7 (*) 13.0 - 17.0 (g/dL)   HCT 16.1 (*) 09.6 - 52.0 (%)   MCV 80.0  78.0 - 100.0 (fL)   MCH 25.5 (*) 26.0 - 34.0 (pg)   MCHC 31.8  30.0 - 36.0 (g/dL)   RDW 04.5 (*) 40.9 - 15.5 (%)   Platelets 214  150 - 400 (K/uL)  DIFFERENTIAL      Component Value Range   Neutrophils Relative 71  43 - 77 (%)   Neutro Abs 5.3  1.7 - 7.7 (K/uL)   Lymphocytes Relative 18  12 - 46 (%)   Lymphs Abs 1.4  0.7 - 4.0 (K/uL)   Monocytes Relative 9  3 - 12 (%)   Monocytes Absolute 0.7  0.1 - 1.0 (K/uL)   Eosinophils Relative 2  0 - 5 (%)   Eosinophils Absolute 0.1  0.0 - 0.7 (K/uL)   Basophils Relative 0  0 - 1 (%)   Basophils Absolute 0.0  0.0 - 0.1 (K/uL)  COMPREHENSIVE METABOLIC PANEL      Component Value Range   Sodium 141  135 -  145 (mEq/L)   Potassium 4.8  3.5 - 5.1 (mEq/L)   Chloride 106  96 - 112 (mEq/L)   CO2 27  19 - 32 (mEq/L)   Glucose, Bld 116 (*) 70 - 99 (mg/dL)   BUN 11  6 - 23 (mg/dL)   Creatinine, Ser 8.11 (*) 0.50 - 1.35 (mg/dL)   Calcium 9.6  8.4 - 91.4 (mg/dL)   Total Protein 7.4  6.0 - 8.3 (g/dL)   Albumin 4.1  3.5 - 5.2 (g/dL)   AST 14  0 - 37 (U/L)   ALT 15  0 - 53 (U/L)   Alkaline Phosphatase 68  39 - 117 (U/L)   Total Bilirubin 0.4  0.3 - 1.2 (mg/dL)   GFR calc non Af Amer 45 (*) >90 (mL/min)   GFR calc Af Amer 53 (*) >90 (mL/min)  URINALYSIS, ROUTINE W REFLEX MICROSCOPIC      Component Value Range   Color, Urine YELLOW  YELLOW    APPearance CLEAR  CLEAR    Specific Gravity, Urine 1.011  1.005 - 1.030    pH 7.0  5.0 - 8.0    Glucose, UA NEGATIVE  NEGATIVE (mg/dL)   Hgb urine dipstick NEGATIVE  NEGATIVE    Bilirubin Urine NEGATIVE  NEGATIVE    Ketones, ur NEGATIVE  NEGATIVE (mg/dL)   Protein, ur NEGATIVE  NEGATIVE (mg/dL)   Urobilinogen, UA 0.2  0.0 - 1.0 (mg/dL)   Nitrite NEGATIVE  NEGATIVE    Leukocytes, UA TRACE (*) NEGATIVE   URINE MICROSCOPIC-ADD ON      Component Value Range   Squamous Epithelial / LPF FEW (*) RARE    WBC, UA 0-2  <3 (WBC/hpf)   RBC / HPF 0-2  <3 (RBC/hpf)   Bacteria, UA FEW (*) RARE      Date: 07/04/2011  Rate: 82  Rhythm: normal sinus rhythm and premature ventricular contractions (PVC)  QRS Axis: normal  Intervals: QT prolonged  ST/T Wave abnormalities: nonspecific ST changes  Conduction Disutrbances:none  Narrative Interpretation:   Old EKG Reviewed: unchanged   Patient here today with symptoms of dry mouth and some possible changes from medication discontinuation. He is given an  IV fluid bolus. His electrolytes are normal although creatinine is slightly elevated at 1.5. His hemoglobin is 10.7 which is stable from prior. He feels improved after 0.5 mg of Ativan IV. He and his wife are advised to call his doctor tomorrow for recheck and medication  adjustment. I personally performed the services described in this documentation, which was scribed in my presence. The recorded information has been reviewed and considered.  Hilario Quarry, MD 07/04/11 1610  Hilario Quarry, MD 07/04/11 [phone]

## 2011-07-05 ENCOUNTER — Telehealth: Payer: Self-pay | Admitting: Family Medicine

## 2011-07-05 ENCOUNTER — Encounter: Payer: Self-pay | Admitting: Family Medicine

## 2011-07-05 ENCOUNTER — Ambulatory Visit (INDEPENDENT_AMBULATORY_CARE_PROVIDER_SITE_OTHER): Payer: Medicare Other | Admitting: Family Medicine

## 2011-07-05 VITALS — BP 114/62 | HR 120 | Temp 97.6°F | Wt 237.0 lb

## 2011-07-05 DIAGNOSIS — F419 Anxiety disorder, unspecified: Secondary | ICD-10-CM

## 2011-07-05 DIAGNOSIS — F411 Generalized anxiety disorder: Secondary | ICD-10-CM

## 2011-07-05 DIAGNOSIS — F458 Other somatoform disorders: Secondary | ICD-10-CM

## 2011-07-05 MED ORDER — CLONAZEPAM 1 MG PO TABS: 1.0000 mg | ORAL_TABLET | Freq: Four times a day (QID) | ORAL | Status: AC | PRN

## 2011-07-05 NOTE — Telephone Encounter (Signed)
Pt had side effects after being taken off med and ended up in ED yesterday. Pt req call back from nurse today asap.

## 2011-07-05 NOTE — Telephone Encounter (Signed)
Pts wife call back again and has decided to bring her spouse in for ov today with Dr [name]. Pt coming in at 3:15pm today.

## 2011-07-05 NOTE — Telephone Encounter (Signed)
[facility] Triage Call Report Triage Record Num: [id number] Operator: Thayer Headings Patient Name: [name] Call Date & Time: 07/03/2011 2:58:34PM Patient Phone: [phone] PCP: Tera Mater. [name] Patient Gender: Male PCP Fax : [phone] Patient DOB: 12-25-1940 [facility] Name[facility] Reason for Call: Caller: [name]/Spouse; PCP: Nelwyn Salisbury.; CB#: [phone]; Calling today 07/03/11 regarding Dry mouth; onset x3 weeks. Spoke with office nurse yesterday and she advised to drink plenty of fluids and follow up with MD next week to try to get this figured out. Wife says he has been drinking lots of fluid but still having very dry mouth. Emergent symptoms r/o by Mouth Lesions guidelines with exception of dry or irritated mouth or tongue within several days of beginning prescribed, nonprescription or alternative medication. Care advice given. Advised wife to call office on Monday AM to make appt to bring him in for evaluation of this problem. Wife said dry mouth started after pt started the Amiodarone which was prescribed by his Cardiologist back in April. Advised wife according to www.drugs.com dry mouth is not listed as one of the side effects of Amiodarone however dry mouth is listed as a side effect of the Clonazepam. Advised wife to go over this with MD at next appt to see if any of his medications can be changed but do not stop any medications until talking with his doctor. Protocol(s) Used: Mouth Lesions Recommended Outcome per Protocol: See Provider within 72 Hours Reason for Outcome: Dry or irritated mouth or tongue within several days of beginning prescribed, nonprescription, OR alternative medication Care Advice: ~ Oral stimulants such as sugarless gum or sugarless lemon drops may help. Do not change prescribed medication(s) or dosing regimen until provider is consulted. Discontinue OTC medication(s). ~ ~ Avoid eating sweets. When eating sweets, make sure to  brush your teeth immediately afterward. Avoid caffeine (coffee, tea, cola drinks, or chocolate), alcohol, and nicotine (use of tobacco), as use of these substances may worsen symptoms. ~ ~ Try a special toothpaste, mouthwash, lotion, or gum specifically for dry mouth that can be obtained over the counter. Increase intake of fluids. Drinking through a straw may reduce discomfort. Eat foods that are easy to swallow. Avoid drinking carbonated or alcoholic drinks. ~ 07/03/2011 3:24:05PM Page 1 of 1 [id number]

## 2011-07-06 ENCOUNTER — Encounter: Payer: Self-pay | Admitting: Family Medicine

## 2011-07-06 NOTE — Progress Notes (Signed)
  Subjective:    Patient ID: [name], male    DOB: 1940/09/22, 71 y.o.   MRN: [id number]  HPI Here for 4 days of severe dry mouth, rapid breathing, SOB, decreased sleep, and numbness or tingling in all four extremities. No chest pains. He was seen at the ED on 07-04-11 with an unremarkable exam, and the workup included normal labs, normal EKG, and normal CXR. No diagnosis was found. His wife admits that he has been very anxious lately, and they have both been very stressed about financial concerns recently.    Review of Systems  Constitutional: Negative.   HENT: Negative.   Eyes: Negative.   Respiratory: Positive for shortness of breath. Negative for cough and wheezing.   Cardiovascular: Negative.   Gastrointestinal: Negative.   Psychiatric/Behavioral: The patient is nervous/anxious.        Objective:   Physical Exam  Constitutional: He is oriented to person, place, and time.       Alert, in a wheelchair. Breathing very rapidly, hyperventilating, very anxious   HENT:  Right Ear: External ear normal.  Left Ear: External ear normal.  Nose: Nose normal.  Mouth/Throat: Oropharynx is clear and moist. No oropharyngeal exudate.  Eyes: Conjunctivae are normal.  Neck: Neck supple. No thyromegaly present.  Cardiovascular: Normal rate, regular rhythm, normal heart sounds and intact distal pulses.   Pulmonary/Chest: Effort normal and breath sounds normal. No respiratory distress. He has no wheezes. He has no rales.  Lymphadenopathy:    He has no cervical adenopathy.  Neurological: He is alert and oriented to person, place, and time.  Psychiatric:       Very anxious, good eye contact          Assessment & Plan:  I think all his recent symptoms stem from anxiety. He is hyperventilating, and this is causing  the dry mouth and the tingling in the extremities. We will taper off Trazadone over the next few weeks, since this can cause dry mouth. We will increase the Clonazepam to 1 mg  four times a day to help with anxiety. He will try to slow his breathing down and breath through the nose as much as possible. He will recheck prn. They plan to estabish with Dr. Lenise Arena at [facility] in [place] soon because this office is much closer to their home.

## 2011-07-07 NOTE — Progress Notes (Signed)
Utilization review completed- retro 

## 2011-07-08 ENCOUNTER — Telehealth: Payer: Self-pay | Admitting: Family Medicine

## 2011-07-08 MED ORDER — QUETIAPINE FUMARATE 50 MG PO TABS: 50.0000 mg | ORAL_TABLET | Freq: Every day | ORAL | Status: DC

## 2011-07-08 NOTE — Telephone Encounter (Signed)
Pt's wife is calling again stating his wife is much dryer than ever and is asking for more advice.

## 2011-07-08 NOTE — Telephone Encounter (Signed)
Pt's wife notified per additional phone note of 07/08/11. See other documentation.

## 2011-07-08 NOTE — Telephone Encounter (Signed)
Addended by: Gershon Crane A on: 07/08/2011 03:50 PM   Modules accepted: Orders

## 2011-07-08 NOTE — Telephone Encounter (Signed)
Notified pt's wife, She wants to make sure it is ok for pt to take trazadone along with the seroquel?  Please advise.

## 2011-07-08 NOTE — Telephone Encounter (Signed)
Patient's spouse called stating that her husband is still experiencing dry mouth and his lips are cracking and this is causing him anxiety. Patient's spouse would like to know what to do about this. Please advise.

## 2011-07-08 NOTE — Telephone Encounter (Signed)
Pt is have bad dry mouth, they believe that it is being caused by his medication to help him sleep and would like to know if this is something they can change. Pt wife stated that he is worse not when when he was in for his last office visit. Pt requesting to be contact

## 2011-07-08 NOTE — Telephone Encounter (Signed)
Yes he can take both together

## 2011-07-08 NOTE — Telephone Encounter (Signed)
Tell them that I called in Seroquel to take at bedtime in addition to the Clonazepam he is taking. This is to reduce his anxiety. If there is not much improvement by next week, we will increase the Seroquel dose

## 2011-07-08 NOTE — Telephone Encounter (Signed)
Pts wife called and wanted to know if Dr [name] was planning on calling in a med to [facility] in [place] [phone] today. The pharmacy is open until 6:30pm today. Pt is aware that Dr [name] is out of the office. Pls call pts wife and let her know status.

## 2011-07-09 IMAGING — CR DG PELVIS 1-2V
1 series · 1 of 1 positions shown · non-contrast
Comparison: None.

CLINICAL DATA: Fall, pain.

PELVIS - 1-2 VIEW

[t pelvis a.p.]
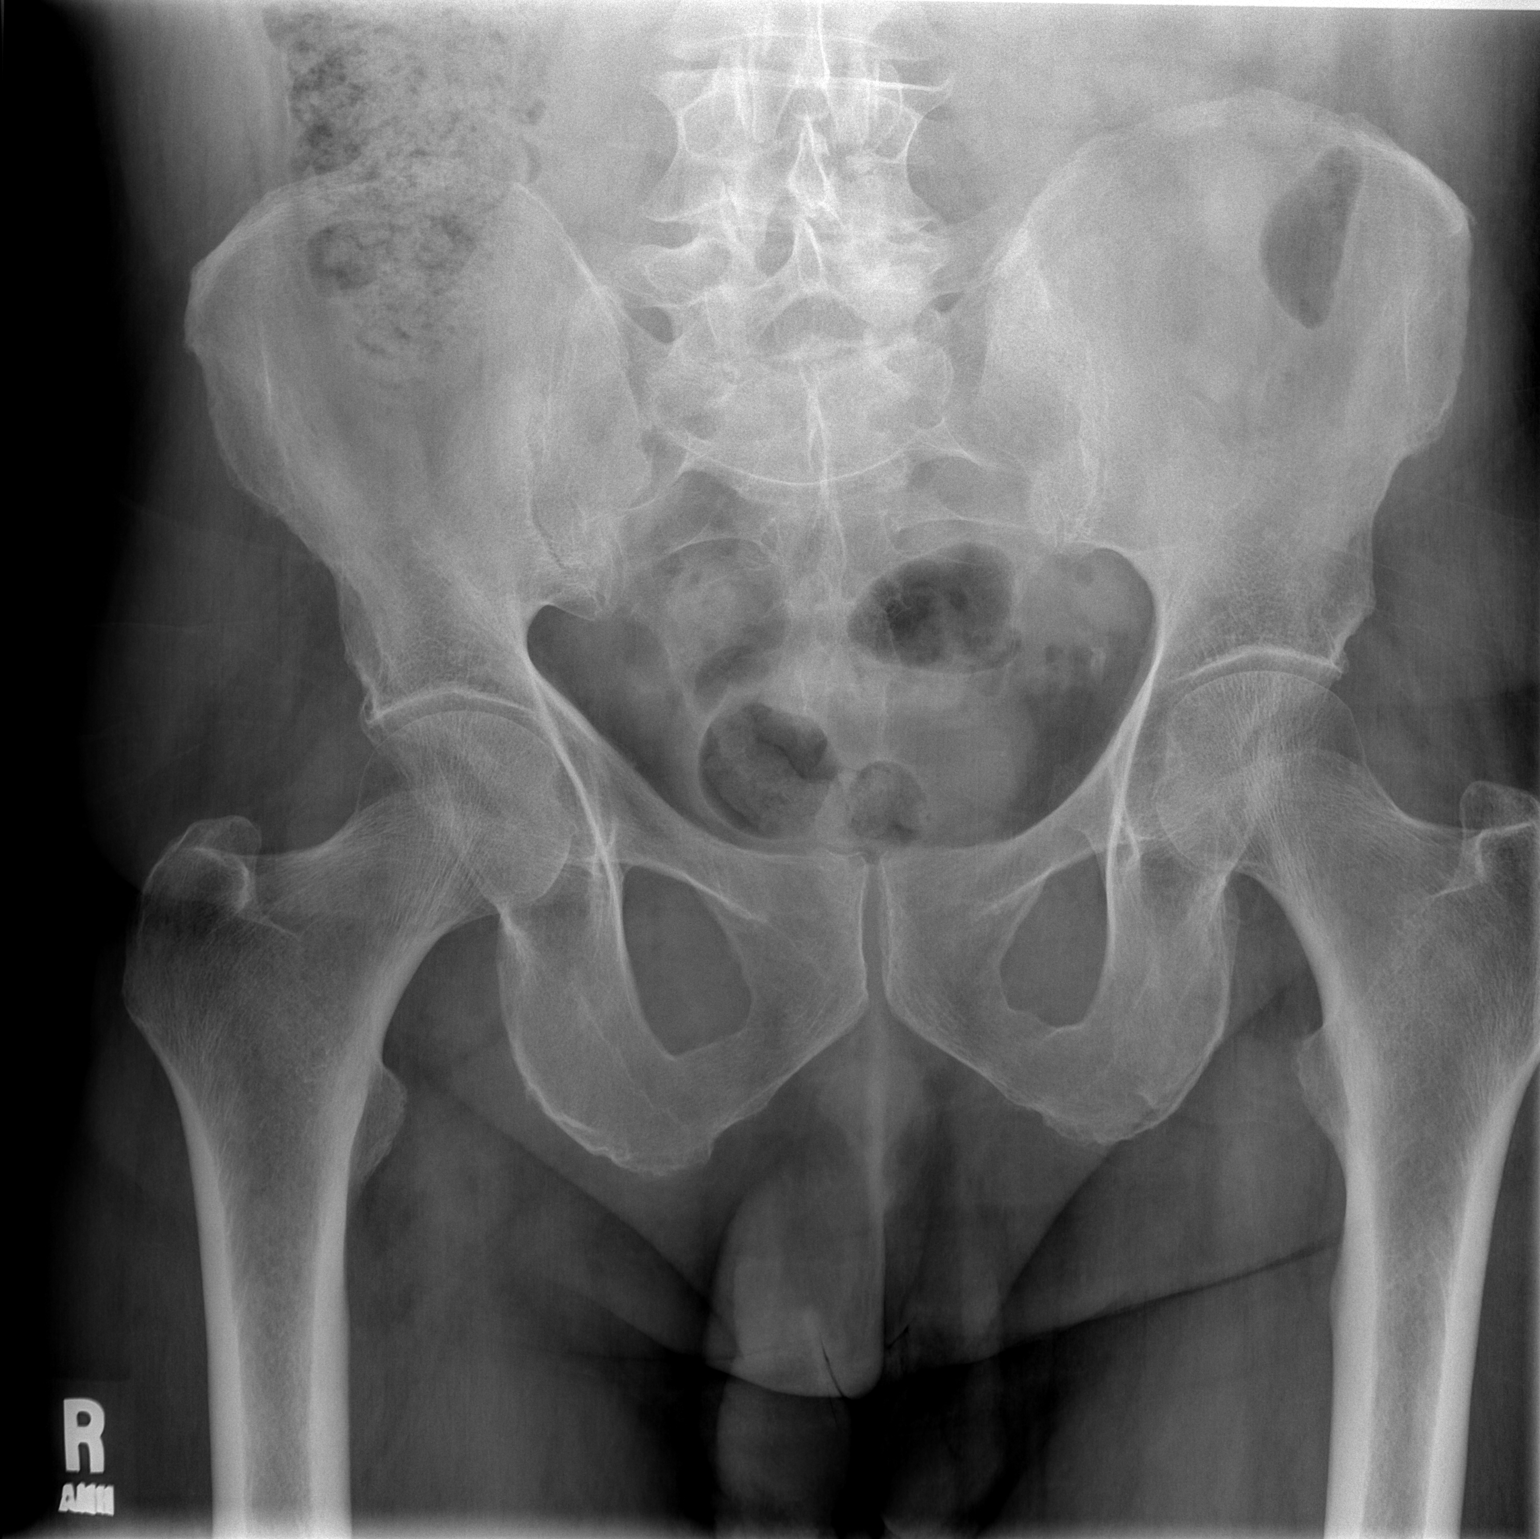

[1 of 1 positions shown; findings below may reference images not displayed]

FINDINGS: The hips are located.  There is no fracture.  Soft
tissues unremarkable.
IMPRESSION: Negative exam.

## 2011-07-09 NOTE — Telephone Encounter (Signed)
I spoke with pt's spouse. 

## 2011-07-11 ENCOUNTER — Inpatient Hospital Stay (HOSPITAL_COMMUNITY)
Admission: EM | Admit: 2011-07-11 | Discharge: 2011-07-12 | DRG: 308 | Disposition: A | Discharge status: Home / Self Care | Payer: Medicare Other | Attending: Cardiology | Admitting: Cardiology

## 2011-07-11 ENCOUNTER — Encounter (HOSPITAL_COMMUNITY): Payer: Self-pay

## 2011-07-11 ENCOUNTER — Emergency Department (HOSPITAL_COMMUNITY): Payer: Medicare Other

## 2011-07-11 DIAGNOSIS — F341 Dysthymic disorder: Secondary | ICD-10-CM | POA: Diagnosis present

## 2011-07-11 DIAGNOSIS — G20A1 Parkinson's disease without dyskinesia, without mention of fluctuations: Secondary | ICD-10-CM | POA: Diagnosis present

## 2011-07-11 DIAGNOSIS — I509 Heart failure, unspecified: Secondary | ICD-10-CM | POA: Diagnosis present

## 2011-07-11 DIAGNOSIS — D649 Anemia, unspecified: Secondary | ICD-10-CM | POA: Diagnosis present

## 2011-07-11 DIAGNOSIS — I4891 Unspecified atrial fibrillation: Principal | ICD-10-CM | POA: Diagnosis present

## 2011-07-11 DIAGNOSIS — Z86718 Personal history of other venous thrombosis and embolism: Secondary | ICD-10-CM

## 2011-07-11 DIAGNOSIS — G20 Parkinson's disease: Secondary | ICD-10-CM | POA: Diagnosis present

## 2011-07-11 DIAGNOSIS — I5033 Acute on chronic diastolic (congestive) heart failure: Secondary | ICD-10-CM | POA: Diagnosis present

## 2011-07-11 DIAGNOSIS — Z951 Presence of aortocoronary bypass graft: Secondary | ICD-10-CM

## 2011-07-11 DIAGNOSIS — R079 Chest pain, unspecified: Secondary | ICD-10-CM

## 2011-07-11 DIAGNOSIS — I2510 Atherosclerotic heart disease of native coronary artery without angina pectoris: Secondary | ICD-10-CM | POA: Diagnosis present

## 2011-07-11 DIAGNOSIS — F411 Generalized anxiety disorder: Secondary | ICD-10-CM | POA: Diagnosis present

## 2011-07-11 DIAGNOSIS — Z86711 Personal history of pulmonary embolism: Secondary | ICD-10-CM

## 2011-07-11 HISTORY — DX: Nonrheumatic mitral (valve) insufficiency: I34.0

## 2011-07-11 LAB — URINE MICROSCOPIC-ADD ON

## 2011-07-11 LAB — COMPREHENSIVE METABOLIC PANEL
AST: 15 U/L (ref 0–37)
Albumin: 3.9 g/dL (ref 3.5–5.2)
Alkaline Phosphatase: 71 U/L (ref 39–117)
Chloride: 102 mEq/L (ref 96–112)
Potassium: 4.5 mEq/L (ref 3.5–5.1)
Sodium: 137 mEq/L (ref 135–145)
Total Bilirubin: 0.4 mg/dL (ref 0.3–1.2)

## 2011-07-11 LAB — CARDIAC PANEL(CRET KIN+CKTOT+MB+TROPI)
CK, MB: 1.8 ng/mL (ref 0.3–4.0)
Troponin I: <0.3 ng/mL (ref <0.30)

## 2011-07-11 LAB — DIFFERENTIAL
Basophils Absolute: 0 10*3/uL (ref 0.0–0.1)
Basophils Relative: 0 % (ref 0–1)
Neutro Abs: 7.9 10*3/uL — ABNORMAL HIGH (ref 1.7–7.7)
Neutrophils Relative %: 81 % — ABNORMAL HIGH (ref 43–77)

## 2011-07-11 LAB — URINALYSIS, ROUTINE W REFLEX MICROSCOPIC
Hgb urine dipstick: NEGATIVE
Ketones, ur: NEGATIVE mg/dL
Protein, ur: NEGATIVE mg/dL
Urobilinogen, UA: 1 mg/dL (ref 0.0–1.0)

## 2011-07-11 LAB — CBC
Hemoglobin: 10.6 g/dL — ABNORMAL LOW (ref 13.0–17.0)
MCHC: 30.3 g/dL (ref 30.0–36.0)
RDW: 17.3 % — ABNORMAL HIGH (ref 11.5–15.5)

## 2011-07-11 MED ORDER — ONDANSETRON HCL 4 MG/2ML IJ SOLN: 4.0000 mg | Freq: Four times a day (QID) | INTRAMUSCULAR | Status: DC | PRN

## 2011-07-11 MED ORDER — ASPIRIN 325 MG PO TABS
325.0000 mg | ORAL_TABLET | Freq: Every day | ORAL | Status: DC
  Administered 2011-07-12: 325 mg via ORAL
  Filled 2011-07-11: qty 1

## 2011-07-11 MED ORDER — NITROGLYCERIN 2 % TD OINT
1.0000 [in_us] | TOPICAL_OINTMENT | Freq: Four times a day (QID) | TRANSDERMAL | Status: DC
  Administered 2011-07-11 – 2011-07-12 (×3): 1 [in_us] via TOPICAL
  Filled 2011-07-11: qty 1
  Filled 2011-07-11: qty 30

## 2011-07-11 MED ORDER — SODIUM CHLORIDE 0.9 % IJ SOLN
3.0000 mL | Freq: Two times a day (BID) | INTRAMUSCULAR | Status: DC
  Administered 2011-07-12: 3 mL via INTRAVENOUS

## 2011-07-11 MED ORDER — AMIODARONE HCL 200 MG PO TABS
200.0000 mg | ORAL_TABLET | Freq: Every day | ORAL | Status: DC
  Administered 2011-07-12: 200 mg via ORAL
  Filled 2011-07-11: qty 1

## 2011-07-11 MED ORDER — SODIUM CHLORIDE 0.9 % IV SOLN: 250.0000 mL | INTRAVENOUS | Status: DC | PRN

## 2011-07-11 MED ORDER — IOHEXOL 350 MG/ML SOLN
100.0000 mL | Freq: Once | INTRAVENOUS | Status: AC | PRN
  Administered 2011-07-11: 100 mL via INTRAVENOUS

## 2011-07-11 MED ORDER — CARBIDOPA-LEVODOPA 25-100 MG PO TABS
1.0000 | ORAL_TABLET | Freq: Two times a day (BID) | ORAL | Status: DC
  Administered 2011-07-12: 1 via ORAL
  Filled 2011-07-11 (×2): qty 1

## 2011-07-11 MED ORDER — SIMVASTATIN 20 MG PO TABS
20.0000 mg | ORAL_TABLET | Freq: Every day | ORAL | Status: DC
  Administered 2011-07-12: 20 mg via ORAL
  Filled 2011-07-11: qty 1

## 2011-07-11 MED ORDER — NITROGLYCERIN 0.4 MG SL SUBL: 0.4000 mg | SUBLINGUAL_TABLET | SUBLINGUAL | Status: DC | PRN

## 2011-07-11 MED ORDER — SODIUM CHLORIDE 0.9 % IJ SOLN: 3.0000 mL | INTRAMUSCULAR | Status: DC | PRN

## 2011-07-11 MED ORDER — ACETAMINOPHEN 325 MG PO TABS: 650.0000 mg | ORAL_TABLET | ORAL | Status: DC | PRN

## 2011-07-11 MED ORDER — SERTRALINE HCL 100 MG PO TABS
100.0000 mg | ORAL_TABLET | Freq: Every day | ORAL | Status: DC
  Administered 2011-07-12: 100 mg via ORAL
  Filled 2011-07-11: qty 1

## 2011-07-11 MED ORDER — HEPARIN BOLUS VIA INFUSION: 4000.0000 [IU] | Freq: Once | INTRAVENOUS | Status: DC

## 2011-07-11 MED ORDER — HEPARIN SODIUM (PORCINE) 5000 UNIT/ML IJ SOLN
INTRAMUSCULAR | Status: AC
  Administered 2011-07-11: 4000 [IU]
  Filled 2011-07-11: qty 1

## 2011-07-11 MED ORDER — ADULT MULTIVITAMIN W/MINERALS CH
1.0000 | ORAL_TABLET | Freq: Every day | ORAL | Status: DC
  Administered 2011-07-12: 1 via ORAL
  Filled 2011-07-11: qty 1

## 2011-07-11 MED ORDER — QUETIAPINE FUMARATE 50 MG PO TABS
50.0000 mg | ORAL_TABLET | Freq: Every day | ORAL | Status: DC
  Filled 2011-07-11 (×2): qty 1

## 2011-07-11 MED ORDER — TAMSULOSIN HCL 0.4 MG PO CAPS
0.4000 mg | ORAL_CAPSULE | Freq: Every day | ORAL | Status: DC
  Administered 2011-07-12: 0.4 mg via ORAL
  Filled 2011-07-11 (×2): qty 1

## 2011-07-11 MED ORDER — CLONAZEPAM 1 MG PO TABS: 1.0000 mg | ORAL_TABLET | Freq: Four times a day (QID) | ORAL | Status: DC | PRN

## 2011-07-11 MED ORDER — PANTOPRAZOLE SODIUM 40 MG PO TBEC
40.0000 mg | DELAYED_RELEASE_TABLET | Freq: Every day | ORAL | Status: DC
  Administered 2011-07-12: 40 mg via ORAL
  Filled 2011-07-11: qty 1

## 2011-07-11 MED ORDER — HEPARIN (PORCINE) IN NACL 100-0.45 UNIT/ML-% IJ SOLN
1350.0000 [IU]/h | INTRAMUSCULAR | Status: DC
  Administered 2011-07-11: 1350 [IU]/h via INTRAVENOUS
  Filled 2011-07-11 (×3): qty 250

## 2011-07-11 MED ORDER — FUROSEMIDE 10 MG/ML IJ SOLN
40.0000 mg | Freq: Two times a day (BID) | INTRAMUSCULAR | Status: DC
  Administered 2011-07-12: 40 mg via INTRAVENOUS
  Filled 2011-07-11 (×4): qty 4

## 2011-07-11 NOTE — ED Provider Notes (Signed)
History     CSN: [id number]  Arrival date & time 07/11/11  1610   First MD Initiated Contact with Patient 07/11/11 1614      Chief Complaint  Patient presents with  . Chest Pain    (Consider location/radiation/quality/duration/timing/severity/associated sxs/prior treatment) HPI Comments: Patient presents with substernal chest tightness that onset around 2:00 and lasted about an hour. It worsens with movement and deep breathing. He was given aspirin nitroglycerin with relief of his chest pain. He states had this pain in the past but has never lasted this long. He believes that he was in atrial fibrillation at the time is associated with shortness of breath and lightheadedness. Currently he has no chest pain. Denies any leg pain or swelling, headache, numbness, tingling, abdominal pain, nausea or vomiting.  He has an extensive cardiac history include PMHx significant for Parkinson's disease, CAD (s/p CABG x 2 in 07/11; LIMA-LAD, VG-OM), MR (s/p MV repair 22mm Edwards ring and oversewing of LA appendage in 07/11), PAF (not a good Coumadin candidate secondary to Parkinson's dz and gait instability), history of DVT/PE (>20 years ago), question of RV dysfunction (on echo 02/13) and HL who presents to [facility] ED today with chest pain.    The history is provided by the patient and the EMS personnel.    Past Medical History  Diagnosis Date  . Atrial fibrillation   . PE (pulmonary embolism)   . DVT (deep venous thrombosis)   . Meningioma   . BPH (benign prostatic hyperplasia)   . Personal history of colonic polyps   . Venereal wart   . Depression   . Asthma   . Chickenpox   . Thrombophlebitis   . GERD (gastroesophageal reflux disease)   . Parkinson disease   . Dementia   . CAD (coronary artery disease)   . Hyperlipidemia   . Angina   . Stricture and stenosis of esophagus   . Hiatal hernia   . Diverticulosis of colon (without mention of hemorrhage)     Past Surgical History    Procedure Date  . Inguinal hernia repair   . Tonsilectomy, adenoidectomy, bilateral myringotomy and tubes   . Varicose vein surgery     left leg  . Back surgery     herniated disc & remove bone spurs  . Coronary artery bypass graft   . Mitral valve repair   . Umbilical hernia repair   . External ear surgery     Family History  Problem Relation Age of Onset  . Arthritis    . Diabetes    . Stroke    . Heart disease Mother     History  Substance Use Topics  . Smoking status: Never Smoker   . Smokeless tobacco: Never Used  . Alcohol Use: No      Review of Systems  Constitutional: Positive for fatigue. Negative for fever, activity change and appetite change.  HENT: Negative for congestion and rhinorrhea.   Respiratory: Positive for chest tightness and shortness of breath. Negative for cough.   Cardiovascular: Positive for chest pain.  Gastrointestinal: Negative for nausea, vomiting and abdominal pain.  Genitourinary: Negative for dysuria and hematuria.  Musculoskeletal: Negative for back pain and joint swelling.  Skin: Negative for rash.  Neurological: Negative for dizziness and headaches.    Allergies  Ibuprofen  Home Medications   No current outpatient prescriptions on file.  BP 101/61  Pulse 87  Temp(Src) 98.9 F (37.2 C) (Oral)  Resp 16  Ht 6' 3.2" (  1.91 m)  Wt 225 lb 15.5 oz (102.5 kg)  BMI 28.10 kg/m2  SpO2 99%  Physical Exam  Constitutional: He is oriented to person, place, and time. He appears well-developed and well-nourished. No distress.  HENT:  Head: Normocephalic and atraumatic.  Mouth/Throat: Oropharynx is clear and moist. No oropharyngeal exudate.  Eyes: Conjunctivae are normal. Pupils are equal, round, and reactive to light.  Neck: Normal range of motion. Neck supple. JVD present.  Cardiovascular: Normal rate and regular rhythm.   Murmur heard. Pulmonary/Chest: Effort normal and breath sounds normal. No respiratory distress. He exhibits  no tenderness.       Bibasilar crackles  Abdominal: There is no tenderness. There is no rebound and no guarding.  Musculoskeletal: Normal range of motion. He exhibits no edema and no tenderness.  Neurological: He is alert and oriented to person, place, and time. No cranial nerve deficit.  Skin: Skin is warm.    ED Course  Procedures (including critical care time)  Labs Reviewed  CBC - Abnormal; Notable for the following:    Hemoglobin 10.6 (*)    HCT 35.0 (*)    MCH 24.7 (*)    RDW 17.3 (*)    All other components within normal limits  DIFFERENTIAL - Abnormal; Notable for the following:    Neutrophils Relative 81 (*)    Neutro Abs 7.9 (*)    Lymphocytes Relative 10 (*)    All other components within normal limits  COMPREHENSIVE METABOLIC PANEL - Abnormal; Notable for the following:    GFR calc non Af Amer 55 (*)    GFR calc Af Amer 63 (*)    All other components within normal limits  PROTIME-INR - Abnormal; Notable for the following:    Prothrombin Time 15.7 (*)    All other components within normal limits  D-DIMER, QUANTITATIVE - Abnormal; Notable for the following:    D-Dimer, Quant 2.42 (*)    All other components within normal limits  URINALYSIS, ROUTINE W REFLEX MICROSCOPIC - Abnormal; Notable for the following:    APPearance TURBID (*)    Leukocytes, UA SMALL (*)    All other components within normal limits  URINE MICROSCOPIC-ADD ON - Abnormal; Notable for the following:    Bacteria, UA FEW (*)    All other components within normal limits  CBC - Abnormal; Notable for the following:    RBC 4.04 (*)    Hemoglobin 10.0 (*)    HCT 32.5 (*)    MCH 24.8 (*)    RDW 17.0 (*)    All other components within normal limits  PRO B NATRIURETIC PEPTIDE - Abnormal; Notable for the following:    Pro B Natriuretic peptide (BNP) 526.9 (*)    All other components within normal limits  COMPREHENSIVE METABOLIC PANEL - Abnormal; Notable for the following:    Albumin 3.2 (*)    GFR  calc non Af Amer 55 (*)    GFR calc Af Amer 63 (*)    All other components within normal limits  CBC - Abnormal; Notable for the following:    RBC 3.77 (*)    Hemoglobin 9.4 (*)    HCT 30.3 (*)    MCH 24.9 (*)    RDW 16.9 (*)    All other components within normal limits  MRSA PCR SCREENING - Abnormal; Notable for the following:    MRSA by PCR POSITIVE (*)    All other components within normal limits  CARDIAC PANEL(CRET KIN+CKTOT+MB+TROPI)  HEPARIN  LEVEL (UNFRACTIONATED)  CARDIAC PANEL(CRET KIN+CKTOT+MB+TROPI)  CARDIAC PANEL(CRET KIN+CKTOT+MB+TROPI)  DIFFERENTIAL  LIPID PANEL  CARDIAC PANEL(CRET KIN+CKTOT+MB+TROPI)   Dg Chest 2 View  07/11/2011  *RADIOLOGY REPORT*  Clinical Data: Chest pain and shortness of breath.  Mitral valve repair.  CHEST - 2 VIEW  Comparison: 07/04/2011  Findings: Cardiomegaly, CABG and cardiac valve replacement is again noted. Pulmonary vascular congestion is identified with mild increased interstitial opacities suggestive of edema. Bilateral lower lung atelectasis/airspace disease noted. Small bilateral pleural effusions are present. There is no evidence of pneumothorax.  IMPRESSION: Cardiomegaly with pulmonary vascular congestion and probable mild interstitial pulmonary edema.  Bilateral lower lung atelectasis/airspace disease and small bilateral pleural effusions.  Original Report Authenticated By: Rosendo Gros, M.D.   Ct Angio Chest W/cm &/or Wo Cm  07/11/2011  *RADIOLOGY REPORT*  Clinical Data: Chest pain.  CT ANGIOGRAPHY CHEST  Technique:  Multidetector CT imaging of the chest using the standard protocol during bolus administration of intravenous contrast. Multiplanar reconstructed images including MIPs were obtained and reviewed to evaluate the vascular anatomy.  Contrast: OMNIPAQUE IOHEXOL 350 MG/ML SOLN  Comparison: Chest x-ray dated 07/11/2011 and chest CT dated 09/10/2009  Findings: There are no pulmonary emboli.  There is cardiomegaly. Evidence of  previous mitral valve replacement.  CABG.  Moderate bilateral pleural effusions.  Focal areas of atelectasis in both lungs most prominent at the right lung base posteriorly. No significant hilar or mediastinal adenopathy.  No significant abnormality of the upper abdomen.  No acute osseous abnormality.  IMPRESSION:  1.  No pulmonary emboli. 2.  Moderate bilateral pleural effusions. 3.  Multiple areas of atelectasis bilaterally, most prominent in the right lower lobe.  Original Report Authenticated By: Gwynn Burly, M.D.     No diagnosis found.    MDM  Substernal chest tightness associated with lightheadedness and shortness of breath, transient visual loss and total body numbness.  Similar to previous episodes of Afib. Remote history of pulmonary embolism. Chest pain nonreproducible.  Mild volume overload on CXR and exam. CP likely 2/2 atrial fibrillation but resolved PTA. ASA, heparin, nitro for possible ACS. CTPE negative.  D/w Dr. Mayford Knife of cardiology who will admit for rule out.  Date: 07/11/2011  Rate: 86  Rhythm: normal sinus rhythm  QRS Axis: normal  Intervals: QT prolonged  ST/T Wave abnormalities: nonspecific ST/T changes  Conduction Disutrbances:none  Narrative Interpretation:   Old EKG Reviewed: unchanged          Glynn Octave, MD 07/12/11 1043

## 2011-07-11 NOTE — H&P (Signed)
PCP: Abran Cantor Primary Cardiologist: Jens Som  CC: Chest pain in the setting of atrial fib  HPI:  71 YOWM with PMHx significant for Parkinson's disease, CAD (s/p CABG x 2 in 07/11; LIMA-LAD, SVG-OM), MR (s/p MV repair 22mm Edwards ring and oversewing of LA appendage in 07/11), PAF (not a good Coumadin candidate secondary to Parkinson's dz and gait instability), history of DVT/PE (>20 years ago), question of RV dysfunction (on echo 02/13) and who presents to [facility] ED today with chest pain.  The patient reports that he was in his usual state of health until this afternoon when he reports going into atrial fibrillation. The patient reported a sense of palpitations, as well as being numb all over and weakness. There was associated chest tightness across his sternum that was nonradiating. It was associated with shortness of breath. He came to the emergency department. However, when he arrived, the atrial fibrillation had stopped. After his atrial fibrillation stopped, he had no further chest pain.  In the ED, he was given aspirin, nitroglycerin paste, and started on heparin.  At baseline, the patient has mild shortness of breath with walking ong distances, but has no orthopnea, PND, or lower extremity edema. He has recently had medications adjusted because of increasing anxiety. Analysis complaint is dry. Eyes and dry mouth of the past 2 days.   Past Medical History  Diagnosis Date  . Atrial fibrillation   . PE (pulmonary embolism)   . DVT (deep venous thrombosis)   . Meningioma   . BPH (benign prostatic hyperplasia)   . Personal history of colonic polyps   . Venereal wart   . Depression   . Asthma   . Chickenpox   . Thrombophlebitis   . GERD (gastroesophageal reflux disease)   . Parkinson disease   . Dementia   . CAD (coronary artery disease)   . Hyperlipidemia   . Angina   . Stricture and stenosis of esophagus   . Hiatal hernia   . Diverticulosis of colon (without mention of hemorrhage)      History   Social History  . Marital Status: Married    Spouse Name: N/A    Number of Children: 2  . Years of Education: N/A   Occupational History  .      Retired   Social History Main Topics  . Smoking status: Never Smoker   . Smokeless tobacco: Never Used  . Alcohol Use: No  . Drug Use: No  . Sexually Active: No   Other Topics Concern  . Not on file   Social History Narrative  . No narrative on file    Family History  Problem Relation Age of Onset  . Arthritis    . Diabetes    . Stroke    . Heart disease Mother      (Not in a hospital admission)  Allergies  Allergen Reactions  . Ibuprofen     Stomach hurts   No current facility-administered medications on file prior to encounter.   Current Outpatient Prescriptions on File Prior to Encounter  Medication Sig Dispense Refill  . amiodarone (PACERONE) 200 MG tablet Take 1 tablet (200 mg total) by mouth daily.      Marland Kitchen aspirin EC 81 MG tablet Take 81 mg by mouth daily.      . carbidopa-levodopa (SINEMET) 25-100 MG per tablet Take 1 tablet by mouth 2 (two) times daily. 1 tablet at breakfast and lunch      . Multiple Vitamin (MULITIVITAMIN WITH MINERALS)  TABS Take 1 tablet by mouth daily.      . pravastatin (PRAVACHOL) 40 MG tablet Take 40 mg by mouth daily.      Marland Kitchen PROTONIX 40 MG tablet TAKE 1 TABLET TWICE A DAY  60 each  11  . QUEtiapine (SEROQUEL) 50 MG tablet Take 1 tablet (50 mg total) by mouth at bedtime.  30 tablet  2  . sertraline (ZOLOFT) 100 MG tablet Take 100 mg by mouth daily.       . Tamsulosin HCl (FLOMAX) 0.4 MG CAPS Take 0.4 mg by mouth daily after breakfast.      . traZODone (DESYREL) 150 MG tablet Take 150 mg by mouth at bedtime. Patient is using 75 milligrams of this medication at bedtime.       . clonazePAM (KLONOPIN) 1 MG tablet Take 1 tablet (1 mg total) by mouth every 6 (six) hours as needed for anxiety.  120 tablet  2  . polyethylene glycol (MIRALAX / GLYCOLAX) packet Take 17 g by mouth  daily as needed. For constipation       . promethazine (PHENERGAN) 25 MG tablet Take 25 mg by mouth every 6 (six) hours as needed. For nausea         Review of Systems: All systems reviewed and are negative except as mentioned above in the history of present illness.    PHYSICAL EXAM: Filed Vitals:   07/11/11 1930  BP: 106/57  Pulse: 76  Temp:   Resp: 21   GENERAL: No acute distress.   HEENT: Normocephalic, atraumatic.  Oropharynx is pink and moist without lesions.  NECK: JVD 7cm above clavicle.  No masses. CV: Regular rate and rhythm with 36 holosystolic murmur at LLSB and apex.     LUNGS: Bibasilar crackles.   ABDOMEN: +BS, soft, nontender, nondistended.  EXTREMITIES: No clubbing, cyanosis, or edema.   NEURO: AO x 3, no focal deficits. PYSCH: Normal affect. SKIN: No rashes.    ECG: 1613 - NSR with no ischemia  Results for orders placed during the hospital encounter of 07/11/11 (from the past 24 hour(s))  CBC     Status: Abnormal   Collection Time   07/11/11  4:38 PM      Component Value Range   WBC 9.8  4.0 - 10.5 (K/uL)   RBC 4.30  4.22 - 5.81 (MIL/uL)   Hemoglobin 10.6 (*) 13.0 - 17.0 (g/dL)   HCT 16.1 (*) 09.6 - 52.0 (%)   MCV 81.4  78.0 - 100.0 (fL)   MCH 24.7 (*) 26.0 - 34.0 (pg)   MCHC 30.3  30.0 - 36.0 (g/dL)   RDW 04.5 (*) 40.9 - 15.5 (%)   Platelets 239  150 - 400 (K/uL)  DIFFERENTIAL     Status: Abnormal   Collection Time   07/11/11  4:38 PM      Component Value Range   Neutrophils Relative 81 (*) 43 - 77 (%)   Neutro Abs 7.9 (*) 1.7 - 7.7 (K/uL)   Lymphocytes Relative 10 (*) 12 - 46 (%)   Lymphs Abs 0.9  0.7 - 4.0 (K/uL)   Monocytes Relative 8  3 - 12 (%)   Monocytes Absolute 0.8  0.1 - 1.0 (K/uL)   Eosinophils Relative 1  0 - 5 (%)   Eosinophils Absolute 0.1  0.0 - 0.7 (K/uL)   Basophils Relative 0  0 - 1 (%)   Basophils Absolute 0.0  0.0 - 0.1 (K/uL)  COMPREHENSIVE METABOLIC PANEL  Status: Abnormal   Collection Time   07/11/11  4:38 PM       Component Value Range   Sodium 137  135 - 145 (mEq/L)   Potassium 4.5  3.5 - 5.1 (mEq/L)   Chloride 102  96 - 112 (mEq/L)   CO2 25  19 - 32 (mEq/L)   Glucose, Bld 96  70 - 99 (mg/dL)   BUN 12  6 - 23 (mg/dL)   Creatinine, Ser 1.19  0.50 - 1.35 (mg/dL)   Calcium 9.1  8.4 - 14.7 (mg/dL)   Total Protein 7.4  6.0 - 8.3 (g/dL)   Albumin 3.9  3.5 - 5.2 (g/dL)   AST 15  0 - 37 (U/L)   ALT 8  0 - 53 (U/L)   Alkaline Phosphatase 71  39 - 117 (U/L)   Total Bilirubin 0.4  0.3 - 1.2 (mg/dL)   GFR calc non Af Amer 55 (*) >90 (mL/min)   GFR calc Af Amer 63 (*) >90 (mL/min)  PROTIME-INR     Status: Abnormal   Collection Time   07/11/11  4:38 PM      Component Value Range   Prothrombin Time 15.7 (*) 11.6 - 15.2 (seconds)   INR 1.22  0.00 - 1.49   D-DIMER, QUANTITATIVE     Status: Abnormal   Collection Time   07/11/11  4:38 PM      Component Value Range   D-Dimer, Quant 2.42 (*) 0.00 - 0.48 (ug/mL-FEU)  CARDIAC PANEL(CRET KIN+CKTOT+MB+TROPI)     Status: Normal   Collection Time   07/11/11  4:39 PM      Component Value Range   Total CK 55  7 - 232 (U/L)   CK, MB 1.8  0.3 - 4.0 (ng/mL)   Troponin I <0.30  <0.30 (ng/mL)   Relative Index RELATIVE INDEX IS INVALID  0.0 - 2.5   URINALYSIS, ROUTINE W REFLEX MICROSCOPIC     Status: Abnormal   Collection Time   07/11/11  8:03 PM      Component Value Range   Color, Urine YELLOW  YELLOW    APPearance TURBID (*) CLEAR    Specific Gravity, Urine 1.019  1.005 - 1.030    pH 8.0  5.0 - 8.0    Glucose, UA NEGATIVE  NEGATIVE (mg/dL)   Hgb urine dipstick NEGATIVE  NEGATIVE    Bilirubin Urine NEGATIVE  NEGATIVE    Ketones, ur NEGATIVE  NEGATIVE (mg/dL)   Protein, ur NEGATIVE  NEGATIVE (mg/dL)   Urobilinogen, UA 1.0  0.0 - 1.0 (mg/dL)   Nitrite NEGATIVE  NEGATIVE    Leukocytes, UA SMALL (*) NEGATIVE   URINE MICROSCOPIC-ADD ON     Status: Abnormal   Collection Time   07/11/11  8:03 PM      Component Value Range   Squamous Epithelial / LPF RARE  RARE     WBC, UA 0-2  <3 (WBC/hpf)   RBC / HPF 0-2  <3 (RBC/hpf)   Bacteria, UA FEW (*) RARE    Urine-Other AMORPHOUS URATES/PHOSPHATES     Dg Chest 2 View  07/11/2011  *RADIOLOGY REPORT*  Clinical Data: Chest pain and shortness of breath.  Mitral valve repair.  CHEST - 2 VIEW  Comparison: 07/04/2011  Findings: Cardiomegaly, CABG and cardiac valve replacement is again noted. Pulmonary vascular congestion is identified with mild increased interstitial opacities suggestive of edema. Bilateral lower lung atelectasis/airspace disease noted. Small bilateral pleural effusions are present. There is no evidence of pneumothorax.  IMPRESSION: Cardiomegaly with pulmonary vascular congestion and probable mild interstitial pulmonary edema.  Bilateral lower lung atelectasis/airspace disease and small bilateral pleural effusions.  Original Report Authenticated By: Rosendo Gros, M.D.   Ct Angio Chest W/cm &/or Wo Cm  07/11/2011  *RADIOLOGY REPORT*  Clinical Data: Chest pain.  CT ANGIOGRAPHY CHEST  Technique:  Multidetector CT imaging of the chest using the standard protocol during bolus administration of intravenous contrast. Multiplanar reconstructed images including MIPs were obtained and reviewed to evaluate the vascular anatomy.  Contrast: OMNIPAQUE IOHEXOL 350 MG/ML SOLN  Comparison: Chest x-ray dated 07/11/2011 and chest CT dated 09/10/2009  Findings: There are no pulmonary emboli.  There is cardiomegaly. Evidence of previous mitral valve replacement.  CABG.  Moderate bilateral pleural effusions.  Focal areas of atelectasis in both lungs most prominent at the right lung base posteriorly. No significant hilar or mediastinal adenopathy.  No significant abnormality of the upper abdomen.  No acute osseous abnormality.  IMPRESSION:  1.  No pulmonary emboli. 2.  Moderate bilateral pleural effusions. 3.  Multiple areas of atelectasis bilaterally, most prominent in the right lower lobe.  Original Report Authenticated By: Gwynn Burly, M.D.     ASSESSMENT and PLAN: 71 year old white male with a past medical history significant for coronary artery disease status post coronary artery bypass grafting, as well as mitral valve repair, and atrial fibrillation, who presents emergency department for evaluation of chest pain that occurred in the setting of atrial fibrillation.  1. We'll admit the patient to cardiology under Dr. Ludwig Clarks care.  2. Chest pain. I suspect that his chest pain was likely from atrial fibrillation with rapid ventricular response. Unfortunately, I do not have the records of the patient's tachycardia and this is only based on his symptomatic report. He has been started on aspirin, nitroglycerin paste, and heparin, in the ED for management of a possible acute coronary syndrome. We will continue to treat him with these medications, and we'll rule him out for myocardial infarction. Should he rule out for myocardial infarction, I think that he could be evaluated with a stress test, either as an inpatient or an outpatient.  3. Acute on chronic diastolic heart failure. The patient is hypervolemic as evidenced by his JVD, as well as his pulmonary edema. I suspect that some of this is related to his atrial fibrillation. We will check a BNP, as well as an echocardiogram. We will look at his diastolic parameters, as well as valvular function as he has evidence of a significant holosystolic murmur. As he is volume overloaded, we will give him Lasix 40 mg IV and will start him on 40 mg twice a day  4. Paroxysmal atrial fibrillation. By the patient's report, it sounds as the patient had an episode of atrial fibrillation today. He is currently in sinus rhythm. We'll continue him on full dose aspirin, and amio. He is not a candidate for anticoagulation for his Parkinson's disease and gait instability.  5. Depression and anxiety. We'll continue home medications.   6: FEN: SLIVF, Electrolytes are stable, NPO after  midnight.    7: DVT prophylaxis: IV heparin.  Bernestine Amass. Mayford Knife, MD Level 3 H&P

## 2011-07-11 NOTE — ED Notes (Signed)
ntg held due to bp in 90's, pt denies pain at this time, pt on stretcher, 2nd iv started for ct angio that was orederd

## 2011-07-11 NOTE — ED Notes (Signed)
Pt here by ems for chest pain since 1400 while at rest, pt sts taht increases wit hmovment and bumps in road and deep breath, and also at rest just hurts, no releif with asa and ntg that was given. Did become hypotensive with ntg from 116 to 88 palpated.

## 2011-07-11 NOTE — ED Notes (Signed)
Pt on stretcher, nad noted

## 2011-07-11 NOTE — ED Notes (Signed)
Patient transported to CT 

## 2011-07-11 NOTE — ED Notes (Signed)
Patient's spouse is Corinne Ports.  She can be reached at [phone].

## 2011-07-11 NOTE — ED Notes (Signed)
Note sent to pharmacy to send heparin.

## 2011-07-11 NOTE — ED Notes (Signed)
Called and gave report to [facility].  Advised patient's wife wants a call once the patient is "settled in."

## 2011-07-11 NOTE — Progress Notes (Signed)
ANTICOAGULATION CONSULT NOTE - Initial Consult  Pharmacy Consult for Heparin Indication: chest pain/ACS  Allergies  Allergen Reactions  . Ibuprofen     Stomach hurts    Patient Measurements: Height: 6' 3.2" (191 cm) Weight: 236 lb 15.9 oz (107.5 kg) IBW/kg (Calculated) : 84.95  Heparin Dosing Weight: 105 kg  Vital Signs: Temp: 98.3 F (36.8 C) (05/19 1920) Temp src: Oral (05/19 1615) BP: 98/60 mmHg (05/19 2155) Pulse Rate: 83  (05/19 2155)  Labs:  Basename 07/11/11 1639 07/11/11 1638  HGB -- 10.6*  HCT -- 35.0*  PLT -- 239  APTT -- --  LABPROT -- 15.7*  INR -- 1.22  HEPARINUNFRC -- --  CREATININE -- 1.29  CKTOTAL 55 --  CKMB 1.8 --  TROPONINI <0.30 --    Estimated Creatinine Clearance: 70.8 ml/min (by C-G formula based on Cr of 1.29).   Medical History: Past Medical History  Diagnosis Date  . Atrial fibrillation   . PE (pulmonary embolism)   . DVT (deep venous thrombosis)   . Meningioma   . BPH (benign prostatic hyperplasia)   . Personal history of colonic polyps   . Venereal wart   . Depression   . Asthma   . Chickenpox   . Thrombophlebitis   . GERD (gastroesophageal reflux disease)   . Parkinson disease   . Dementia   . CAD (coronary artery disease)   . Hyperlipidemia   . Angina   . Stricture and stenosis of esophagus   . Hiatal hernia   . Diverticulosis of colon (without mention of hemorrhage)     Medications: TBD  Assessment: CP with extensive cardiac history including CABG 7/11 with MV repair, PAF,h/o DVT/PE (20 years ago), HLD. Anemia noted with Hgb 10.36. Platelets ok. Very elevated Ddimer 2.42. Baseline INR also elevated at 1.22. Not on anticoag PTA (determined not to be a Coumadin candidate in the past) and LFT's WNL.  Goal of Therapy:  Heparin level 0.3-0.7 units/ml Monitor platelets by anticoagulation protocol: Yes   Plan:  Heparin 4000 unit IV bolus. Infusion 1350 units/hr (13 units/kg/hr). Check heparin level and CBC  daily.  Misty Stanley Stillinger 07/11/2011,9:56 PM

## 2011-07-12 ENCOUNTER — Telehealth: Payer: Self-pay | Admitting: Family Medicine

## 2011-07-12 ENCOUNTER — Encounter (HOSPITAL_COMMUNITY): Payer: Self-pay | Admitting: Physician Assistant

## 2011-07-12 DIAGNOSIS — R079 Chest pain, unspecified: Secondary | ICD-10-CM

## 2011-07-12 DIAGNOSIS — I059 Rheumatic mitral valve disease, unspecified: Secondary | ICD-10-CM

## 2011-07-12 LAB — CBC
HCT: 30.3 % — ABNORMAL LOW (ref 39.0–52.0)
Hemoglobin: 9.4 g/dL — ABNORMAL LOW (ref 13.0–17.0)
MCHC: 31 g/dL (ref 30.0–36.0)
MCHC: 30.8 g/dL (ref 30.0–36.0)
Platelets: 234 10*3/uL (ref 150–400)
RDW: 17 % — ABNORMAL HIGH (ref 11.5–15.5)
WBC: 8.7 10*3/uL (ref 4.0–10.5)

## 2011-07-12 LAB — CARDIAC PANEL(CRET KIN+CKTOT+MB+TROPI)
CK, MB: 1.7 ng/mL (ref 0.3–4.0)
CK, MB: 1.9 ng/mL (ref 0.3–4.0)
Relative Index: INVALID (ref 0.0–2.5)
Total CK: 48 U/L (ref 7–232)
Troponin I: <0.3 ng/mL (ref <0.30)
Troponin I: <0.3 ng/mL (ref <0.30)
Troponin I: <0.3 ng/mL (ref <0.30)

## 2011-07-12 LAB — DIFFERENTIAL
Basophils Absolute: 0 10*3/uL (ref 0.0–0.1)
Basophils Relative: 0 % (ref 0–1)
Lymphocytes Relative: 21 % (ref 12–46)
Monocytes Relative: 10 % (ref 3–12)
Neutro Abs: 5.7 10*3/uL (ref 1.7–7.7)
Neutrophils Relative %: 66 % (ref 43–77)

## 2011-07-12 LAB — COMPREHENSIVE METABOLIC PANEL
ALT: 10 U/L (ref 0–53)
BUN: 12 mg/dL (ref 6–23)
CO2: 23 mEq/L (ref 19–32)
Calcium: 8.5 mg/dL (ref 8.4–10.5)
Creatinine, Ser: 1.29 mg/dL (ref 0.50–1.35)
GFR calc Af Amer: 63 mL/min — ABNORMAL LOW (ref >90)
GFR calc non Af Amer: 55 mL/min — ABNORMAL LOW (ref >90)
Glucose, Bld: 91 mg/dL (ref 70–99)
Sodium: 137 mEq/L (ref 135–145)
Total Protein: 6.2 g/dL (ref 6.0–8.3)

## 2011-07-12 LAB — LIPID PANEL
HDL: 45 mg/dL (ref >39)
LDL Cholesterol: 65 mg/dL (ref 0–99)
Total CHOL/HDL Ratio: 2.8 RATIO
Triglycerides: 77 mg/dL (ref <150)

## 2011-07-12 LAB — HEPARIN LEVEL (UNFRACTIONATED): Heparin Unfractionated: 0.35 IU/mL (ref 0.30–0.70)

## 2011-07-12 MED ORDER — HEPARIN SODIUM (PORCINE) 5000 UNIT/ML IJ SOLN
5000.0000 [IU] | Freq: Three times a day (TID) | INTRAMUSCULAR | Status: DC
  Filled 2011-07-12 (×2): qty 1

## 2011-07-12 MED ORDER — CHLORHEXIDINE GLUCONATE CLOTH 2 % EX PADS
6.0000 | MEDICATED_PAD | Freq: Every day | CUTANEOUS | Status: DC
  Administered 2011-07-12: 6 via TOPICAL

## 2011-07-12 MED ORDER — FUROSEMIDE 40 MG PO TABS
40.0000 mg | ORAL_TABLET | Freq: Two times a day (BID) | ORAL | Status: DC
  Administered 2011-07-12 (×2): 40 mg via ORAL
  Filled 2011-07-12 (×3): qty 1

## 2011-07-12 MED ORDER — MUPIROCIN 2 % EX OINT
1.0000 "application " | TOPICAL_OINTMENT | Freq: Two times a day (BID) | CUTANEOUS | Status: DC
  Administered 2011-07-12: 1 via NASAL
  Filled 2011-07-12: qty 22

## 2011-07-12 MED ORDER — ASPIRIN 81 MG PO CHEW: 81.0000 mg | CHEWABLE_TABLET | Freq: Every day | ORAL | Status: DC

## 2011-07-12 MED ORDER — POLYETHYLENE GLYCOL 3350 17 G PO PACK
17.0000 g | PACK | Freq: Every day | ORAL | Status: DC
  Administered 2011-07-12: 17 g via ORAL
  Filled 2011-07-12: qty 1

## 2011-07-12 NOTE — Progress Notes (Signed)
Cardiology Progress Note Patient Name: JOWEL WALTNER Date of Encounter: 07/12/2011, 6:46 AM     Subjective  No overnight events. Patient denies palpitations, chest pain, sob, numbness or weakness.  He reports having sudden onset of total body numbness, vision loss, palpitations and chest pain yesterday. He thinks he was in atrial fibrillation and reports having these symptoms in the past when in a.fib. Evaluation for similar symptoms earlier 06/25/11, 04/2011 and 10/2010 included MRI brain without acute infarct (chronic microvascular ischemia) and ruled out for MI. He denies slurred speech or focal weakness. Symptoms resolved before arrival to ED when he was found in NSR.   Objective   Telemetry: NSR 70s-80s, occ PVCs  Medications: . amiodarone  200 mg Oral Daily  . aspirin  325 mg Oral Daily  . carbidopa-levodopa  1 tablet Oral BID  . Chlorhexidine Gluconate Cloth  6 each Topical Q0600  . furosemide  40 mg Intravenous BID  . heparin      . mulitivitamin with minerals  1 tablet Oral Daily  . mupirocin ointment  1 application Nasal BID  . nitroGLYCERIN  1 inch Topical Q6H  . pantoprazole  40 mg Oral Q1200  . QUEtiapine  50 mg Oral QHS  . sertraline  100 mg Oral Daily  . simvastatin  20 mg Oral q1800  . sodium chloride  3 mL Intravenous Q12H  . Tamsulosin HCl  0.4 mg Oral QPC breakfast   . heparin 1,350 Units/hr (07/11/11 2247)    Physical Exam: Temp:  [98.1 F (36.7 C)-98.9 F (37.2 C)] 98.9 F (37.2 C) (05/20 0641) Pulse Rate:  [73-87] 87  (05/20 0641) Resp:  [16-28] 16  (05/20 0641) BP: (90-116)/(54-72) 101/61 mmHg (05/20 0641) SpO2:  [18 %-100 %] 99 % (05/20 0641) Weight:  [225 lb 15.5 oz (102.5 kg)-236 lb 15.9 oz (107.5 kg)] 225 lb 15.5 oz (102.5 kg) (05/19 2327)  General: Overweight elderly white male, in no acute distress. Head: Normocephalic, atraumatic, sclera non-icteric, nares are without discharge.  Neck: Supple. Negative for carotid bruits or JVD Lungs:  Clear bilaterally to auscultation without wheezes, rales, or rhonchi. Breathing is unlabored. Heart: RRR S1 S2. 3/6 systolic murmur. No rubs or gallops.  Abdomen: Soft, non-tender, non-distended with normoactive bowel sounds. No rebound/guarding. No obvious abdominal masses. Msk:  Strength and tone appear normal for age. Extremities: Trace BLE edema. No clubbing or cyanosis. Distal pedal pulses are intact and equal bilaterally. Neuro: Alert and oriented X 3. Moves all extremities spontaneously. Psych:  Responds to questions appropriately with a normal affect.  No intake or output data in the 24 hours ending 07/12/11 0646  Labs:  [facility] 07/12/11 0046 07/11/11 1638  NA 137 137  K 4.0 4.5  CL 103 102  CO2 23 25  GLUCOSE 91 96  BUN 12 12  CREATININE 1.29 1.29  CALCIUM 8.5 9.1   Basename 07/12/11 0046 07/11/11 1638  AST 10 15  ALT 10 8  ALKPHOS 60 71  BILITOT 0.4 0.4  PROT 6.2 7.4  ALBUMIN 3.2* 3.9   Basename 07/12/11 0527 07/12/11 0046 07/11/11 1638  WBC 8.4 8.7 --  NEUTROABS -- 5.7 7.9*  HGB 10.0* 9.4* --  HCT 32.5* 30.3* --  MCV 80.4 80.4 --  PLT 234 218 --   Basename 07/12/11 0045 07/11/11 1639  CKTOTAL 45 55  CKMB 2.0 1.8  TROPONINI <0.30 <0.30   Basename 07/11/11 1638  DDIMER 2.42*     07/12/2011 00:45  Pro B Natriuretic peptide (BNP) 526.9 (H)   Radiology/Studies:   07/11/2011 - Chest 2 View Findings: Cardiomegaly, CABG and cardiac valve replacement is again noted. Pulmonary vascular congestion is identified with mild increased interstitial opacities suggestive of edema. Bilateral lower lung atelectasis/airspace disease noted. Small bilateral pleural effusions are present. There is no evidence of pneumothorax.  IMPRESSION: Cardiomegaly with pulmonary vascular congestion and probable mild interstitial pulmonary edema.  Bilateral lower lung atelectasis/airspace disease and small bilateral pleural effusions.    07/11/2011 - Ct Angio Chest  Findings: There are no  pulmonary emboli.  There is cardiomegaly. Evidence of previous mitral valve replacement.  CABG.  Moderate bilateral pleural effusions.  Focal areas of atelectasis in both lungs most prominent at the right lung base posteriorly. No significant hilar or mediastinal adenopathy.  No significant abnormality of the upper abdomen.  No acute osseous abnormality.  IMPRESSION:  1.  No pulmonary emboli. 2.  Moderate bilateral pleural effusions. 3.  Multiple areas of atelectasis bilaterally, most prominent in the right lower lobe.      Assessment and Plan  71 y.o. 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  1. Chest Pain: Presented after experiencing episode of numbness all over his body, loss of vision, and chest tightness in the setting of palpitations and patient reported episode of atrial fibrillation. Chest pain, numbness, and vision loss resolved after resolution of palpitations. Similar presentation multiple times in the last year and ruled out for MI, PE, and acute neuro event. NSR without acute ischemic changes on arrival to ED. DDimer elevated, CTA chest w/o evidence of PE. Cardiac enzymes normal x2. No recurrent chest pain overnight. No focal neuro deficits. Remained in NSR. No evidence for ACS. Chest pain likely 2/2 paroxysmal atrial fibrillation w/ RVR. Discontinue heparin drip if last set of cardiac enzymes normal. He has had multiple ER visits/hospitalizations for chest pain with MI rule outs. Consider outpatient myoview for more definitive ischemic evaluation.  2. CAD: s/p CABG '11. No objective evidence for ischemia. Cont ASA and statin.   3. Paroxysmal Atrial Fibrillation: Patient reported episode of atrial fibrillation  w/ RVR at home, NSR at time of presentation. Remains in NSR on amiodarone. Not an anticoagulation candidate 2/2 gait instability. Consider outpatient event monitor to assess atrial fibrillation burden.  4. Acute on Chronic Diastolic CHF: Echo 04/15/11 mod LVH, EF 60%, no WMAs, mild MR w/ valve area 2.22cm2, severe LAE, incomplete RV assessment. No c/o worsened sob/doe/orthopnea/PND/edema. Mild volume overload on CXR and exam. BNP mildly elevated at 526. Likely related to a.fib w/ RVR. Given IV lasix last night. No I/Os documented. Patient reports urinating a lot. Documented weight 236 --> 225lbs, likely inaccurate. Euvolemic on exam this morning.  Change lasix to po. Echo ordered to assess diastolic parameters, RV function and mitral valve; pending.   Signed, HOPE, JESSICA PA-C  Patient seen and examined.  Agree with findings of J hope.  CP:  CP resolved as well as numbness.  Enzymes so far negative. Continue monitoring for now.  If enzymes remain neg recomm outpt event monitor as well as myoview.  Diastolic CHF.  Volume is not bad.  Got lasix  Will check echo   MR:  Echo.

## 2011-07-12 NOTE — Telephone Encounter (Signed)
I don't know where this would be stored. Please find his DNR document

## 2011-07-12 NOTE — Progress Notes (Signed)
Utilization review completed.  

## 2011-07-12 NOTE — Progress Notes (Signed)
  Echocardiogram 2D Echocardiogram has been performed.  Sharlee Rufino L 07/12/2011, 11:36 AM

## 2011-07-12 NOTE — Discharge Summary (Signed)
Discharge Summary   Patient ID: [name] MRN: [id number], DOB/AGE: Apr 25, 1940 71 y.o. Admit date: 07/11/2011 D/C date:     07/12/2011   Primary Discharge Diagnoses:  1. Chest pain, felt secondary to atrial fib with RVR 2. Acute on chronic diastolic CHF 3. PAF - not Coumadin candidate 2/2 Parkinson's/gait instability 4. Anemia - instructed to f/u PCP  Secondary Discharge Diagnoses:  1. CAD s/p CABG x 2 08/2009 (LIMA->LAD, SVG-OM) 2. Mitral regurgitation s/p MV repair 22mm Edwards ring and oversewing of LA appendage in 07/11 3. 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 Meningioma 5. BPH 6. Venereal wart 7. Personal hx of colonic polyps 8. Depression 9. Asthma 10. Chickenpox 11. Thrombophlebitis 12. GERD 13. Parkinson's disease 14. Dementia 15. Hyperlipidemia 16. Stricture and stenosis of esophagus 17. Hiatal hernia 18. Diverticulosis of colon   Hospital Course: 71 y/o M with hx of Parkinson's dz, CAD, MV repair, PAF, Parkinson's disease presented to [facility] with complaints of CP. He was in his usual state of health yesterday afternoon when he felt as though he had gone into afib. He reported palpitations as well as being numb all over with weakness. There was nonradiating CP associated with SOB. He came to the ER, however when he arrived his atrial fib stopped. When it did so he had no further CP. In the ED, he was given aspirin, nitroglycerin paste, and started on heparin. He recently had meds adjusted due to increasing anxiety. Initial cardiac markers were negative. EKG showed NSR without evidence for ischemia. His d-dimer was elevated but CT angio showed no pulmonary emboli. It did show moderate bilateral pleural effusions. He was treated with IV Lasix in the ER for concern for acute on chronic diastolic CHF (had JVD). This was suspected to be related to his atrial fibrillation. He improved with the Lasix regimen and today his volume status was felt to be stable. Dr. Tenny Craw does  not feel that he will require Lasix at home for now, but she would like the patient to have an outpatient Lexiscan Myoview to assess for ischemia and event monitor to assess for arrhythmia (either PAF, or an alternative rhythm which may have contributed to his symptoms). Per her recommendation, he should also have a BNP and BMET on the day of the Myoview. Troponins remained negative the rest of his admission. We will hold off on adding NTG given borderline low blood pressures. His 2D echo this admissiondemonstrated EF 60-65%, mild MR, severely dilated LA. There was no evidence for RV dysfunction as previously questioned on prior echo. Of note he is also mildly anemic but these values are in line with prior labs. He will be instructed to f/u PCP for this. Dr. Tenny Craw has seen and examined him and feels he is stable for discharge.  Discharge Vitals: Blood pressure 106/72, pulse 73, temperature 98.1 F (36.7 C), temperature source Oral, resp. rate 22, height 6' 3.2" (1.91 m), weight 225 lb 15.5 oz (102.5 kg), SpO2 97.00%. Verified by nursing that pt's O2 sat was 97% on RA at time of dc  Labs: Lab Results  Component Value Date   WBC 8.4 07/12/2011   HGB 10.0* 07/12/2011   HCT 32.5* 07/12/2011   MCV 80.4 07/12/2011   PLT 234 07/12/2011     Lab 07/12/11 0046  NA 137  K 4.0  CL 103  CO2 23  BUN 12  CREATININE 1.29  CALCIUM 8.5  PROT 6.2  BILITOT 0.4  ALKPHOS 60  ALT 10  AST 10  GLUCOSE 91    Basename 07/12/11 1145 07/12/11 0527 07/12/11 0045 07/11/11 1639  CKTOTAL 48 49 45 55  CKMB 1.9 1.7 2.0 1.8  TROPONINI <0.30 <0.30 <0.30 <0.30   Lab Results  Component Value Date   CHOL 125 07/12/2011   HDL 45 07/12/2011   LDLCALC 65 07/12/2011   TRIG 77 07/12/2011   Lab Results  Component Value Date   DDIMER 2.42* 07/11/2011    Diagnostic Studies/Procedures   1. Chest 2 View 07/11/2011  *RADIOLOGY REPORT*  Clinical Data: Chest pain and shortness of breath.  Mitral valve repair.  CHEST - 2 VIEW   Comparison: 07/04/2011  Findings: Cardiomegaly, CABG and cardiac valve replacement is again noted. Pulmonary vascular congestion is identified with mild increased interstitial opacities suggestive of edema. Bilateral lower lung atelectasis/airspace disease noted. Small bilateral pleural effusions are present. There is no evidence of pneumothorax.  IMPRESSION: Cardiomegaly with pulmonary vascular congestion and probable mild interstitial pulmonary edema.  Bilateral lower lung atelectasis/airspace disease and small bilateral pleural effusions.  Original Report Authenticated By: Rosendo Gros, M.D.   2. Ct Angio Chest W/cm &/or Wo Cm 07/11/2011  *RADIOLOGY REPORT*  Clinical Data: Chest pain.  CT ANGIOGRAPHY CHEST  Technique:  Multidetector CT imaging of the chest using the standard protocol during bolus administration of intravenous contrast. Multiplanar reconstructed images including MIPs were obtained and reviewed to evaluate the vascular anatomy.  Contrast: OMNIPAQUE IOHEXOL 350 MG/ML SOLN  Comparison: Chest x-ray dated 07/11/2011 and chest CT dated 09/10/2009  Findings: There are no pulmonary emboli.  There is cardiomegaly. Evidence of previous mitral valve replacement.  CABG.  Moderate bilateral pleural effusions.  Focal areas of atelectasis in both lungs most prominent at the right lung base posteriorly. No significant hilar or mediastinal adenopathy.  No significant abnormality of the upper abdomen.  No acute osseous abnormality.  IMPRESSION:  1.  No pulmonary emboli. 2.  Moderate bilateral pleural effusions. 3.  Multiple areas of atelectasis bilaterally, most prominent in the right lower lobe.  Original Report Authenticated By: Gwynn Burly, M.D.   Discharge Medications   Medication List  As of 07/12/2011  5:20 PM   TAKE these medications         amiodarone 200 MG tablet   Commonly known as: PACERONE   Take 1 tablet (200 mg total) by mouth daily.      aspirin EC 81 MG tablet   Take 81 mg  by mouth daily.      carbidopa-levodopa 25-100 MG per tablet   Commonly known as: SINEMET IR   Take 1 tablet by mouth 2 (two) times daily. 1 tablet at breakfast and lunch      clonazePAM 1 MG tablet   Commonly known as: KLONOPIN   Take 1 tablet (1 mg total) by mouth every 6 (six) hours as needed for anxiety.      mulitivitamin with minerals Tabs   Take 1 tablet by mouth daily.      polyethylene glycol packet   Commonly known as: MIRALAX / GLYCOLAX   Take 17 g by mouth daily as needed. For constipation        pravastatin 40 MG tablet   Commonly known as: PRAVACHOL   Take 40 mg by mouth daily.      promethazine 25 MG tablet   Commonly known as: PHENERGAN   Take 25 mg by mouth every 6 (six) hours as needed. For nausea      PROTONIX  40 MG tablet   Generic drug: pantoprazole   TAKE 1 TABLET TWICE A DAY      QUEtiapine 50 MG tablet   Commonly known as: SEROQUEL   Take 1 tablet (50 mg total) by mouth at bedtime.      sertraline 100 MG tablet   Commonly known as: ZOLOFT   Take 100 mg by mouth daily.      Tamsulosin HCl 0.4 MG Caps   Commonly known as: FLOMAX   Take 0.4 mg by mouth daily after breakfast.      traZODone 150 MG tablet   Commonly known as: DESYREL   Take 150 mg by mouth at bedtime. Patient is using 75 milligrams of this medication at bedtime.              Disposition   The patient will be discharged in stable condition to home. Discharge Orders    Future Appointments: Provider: Department: Dept Phone: Center:   08/11/2011 2:15 PM Lewayne Bunting, MD [facility] [phone] [id number]     Future Orders Please Complete By Expires   Diet - low sodium heart healthy      Increase activity slowly        Follow-up Information    Follow up with [facility]. (Dr. Ludwig Clarks office will be calling you to set up a) a time to pickup your heart monitor, and b) your appointment for your stress test. If you have not heard back within 2 days,  please call our office.)    Contact information:   [address] [place] [address] [phone]      Follow up with Primary Care Doctor. (Your blood counts were slightly decreased indicating that you have anemia. This is stable from previous labs. Please follow up with your primary care doctor for monitoring and evaluation.)            Duration of Discharge Encounter: Greater than 30 minutes including physician and PA time.  Signed, Ronie Spies PA-C 07/12/2011, 5:20 PM

## 2011-07-12 NOTE — Progress Notes (Signed)
ANTICOAGULATION CONSULT NOTE - Follow Up Consult  Enzymes negative, IV heparin discontinued.  Heparin SQ for VTE prophylaxis to begin.  Plan:  Heparin 5000 units SQ q8hrs to begin tonight.           Pharmacy to sign off.  Dennie Fetters, RPh Pager: [phone] 07/12/2011,12:35 PM

## 2011-07-12 NOTE — Discharge Instructions (Signed)
   You will have a Stress Test scheduled at [facility] in [place]. Your doctor has ordered this test to get a better idea of how your heart works.  Please arrive 15 minutes early for paperwork.   Location: [address] [place], [place] [address] [phone]  Instructions:  No food/drink after midnight the night before.   No caffeine/decaf products 24 hours before, including medicines such as Excedrin or Goody Powders. Call if there are any questions.   Wear comfortable clothes and shoes.   It is OK to take your morning meds with a sip of water EXCEPT for those types of medicines listed below or otherwise instructed.  Special Medication Instructions:  Beta blockers such as metoprolol (Lopressor/Toprol XL), atenolol (Tenormin), carvedilol (Coreg), nebivolol (Bystolic), propranolol (Inderal) should not be taken for 24 hours before the test.  Calcium channel blockers such as diltiazem (Cardizem) or verapmil (Calan) should not be taken for 24 hours before the test.  Remove nitroglycerin patches and do not take nitrate preparations such as Imdur/isosorbide the day of your test.  No Persantine/Theophylline or Aggrenox medicines should be used within 24 hours of the test.   What To Expect: The whole test will take several hours. When you arrive in the lab, the technician will inject a small amount of radioactive tracer into your arm through an IV while you are resting quietly. This helps [place] to form pictures of your heart. You will likely only feel a sting from the IV. After a waiting period, resting pictures will be obtained under a big camera. These are the "before" pictures.  Next, you will be prepped for the stress portion of the test. This may include either walking on a treadmill or receiving a medicine that helps to dilate blood vessels in your heart to simulate the effect of exercise on your heart. If you are walking on a treadmill, you will walk at different  paces to try to get your heart rate to a goal number that is based on your age. If your doctor has chosen the pharmacologic test, then you will receive a medicine through your IV that may cause temporary nausea, flushing, shortness of breath and sometimes chest discomfort or vomiting. This is typically short-lived and usually resolves quickly. Your blood pressure and heart rate will be monitored, and we will be watching your EKG on a computer screen for any changes. During this portion of the test, the radiologist will inject another small amount of radioactive tracer into your IV. After a waiting period, you will undergo a second set of pictures. These are the "after" pictures.  The doctor reading the test will compare the before-and-after images to look for evidence of heart blockages or heart weakness. In certain instances, this test is done over 2 days but usually only takes 1 day to complete.

## 2011-07-12 NOTE — Progress Notes (Signed)
ANTICOAGULATION CONSULT NOTE   Pharmacy Consult for Heparin Indication: chest pain/ACS  Allergies  Allergen Reactions  . Ibuprofen     Stomach hurts    Patient Measurements: Height: 6' 3.2" (191 cm) Weight: 225 lb 15.5 oz (102.5 kg) IBW/kg (Calculated) : 84.95  Heparin Dosing Weight: 105 kg  Vital Signs: Temp: 98.9 F (37.2 C) (05/20 0641) BP: 101/61 mmHg (05/20 0641) Pulse Rate: 87  (05/20 0641)  Labs:  Alvira Philips 07/12/11 0527 07/12/11 0046 07/12/11 0045 07/11/11 1639 07/11/11 1638  HGB 10.0* 9.4* -- -- --  HCT 32.5* 30.3* -- -- 35.0*  PLT 234 218 -- -- 239  APTT -- -- -- -- --  LABPROT -- -- -- -- 15.7*  INR -- -- -- -- 1.22  HEPARINUNFRC 0.35 -- -- -- --  CREATININE -- 1.29 -- -- 1.29  CKTOTAL PENDING -- 45 55 --  CKMB 1.7 -- 2.0 1.8 --  TROPONINI <0.30 -- <0.30 <0.30 --    Estimated Creatinine Clearance: 69.3 ml/min (by C-G formula based on Cr of 1.29).  Assessment: 71 yo male with Afib for Heparin  Goal of Therapy:  Heparin level 0.3-0.7 units/ml Monitor platelets by anticoagulation protocol: Yes   Plan:  Continue Heparin at current rate  F/U plan  Nyjai Graff, Gary Fleet 07/12/2011,6:51 AM

## 2011-07-12 NOTE — Telephone Encounter (Signed)
Pt is in [facility] room # [id number]. Pt has a DNR in his chart, but it has expired. Need a new one, faxed over to the hospital at the nurses station on 3rd floor. Pts wife is going to call back with info and she is also going to check to see if they could get this form from hospital.

## 2011-07-14 ENCOUNTER — Telehealth: Payer: Self-pay | Admitting: Cardiology

## 2011-07-14 ENCOUNTER — Encounter: Payer: Self-pay | Admitting: Cardiology

## 2011-07-14 ENCOUNTER — Telehealth: Payer: Self-pay | Admitting: Family Medicine

## 2011-07-14 NOTE — Telephone Encounter (Signed)
New msg Pt's wife wanted to talk to you again about his meds. Please call

## 2011-07-14 NOTE — Telephone Encounter (Signed)
Pts spouse called and said that pt has decided to use Hospice. Pt doesn't want to take any more meds because they are not work. Pt wants to know what they needs to know in order to get hospice for in home visits. Pts wife is req call back from nurse asap.

## 2011-07-14 NOTE — Telephone Encounter (Signed)
I do not think he qualifies for Hospice since he is not terminal at this time. I believe he has more than 6 months to live. We could ask Home Health to visit ands assess for their services if they wish

## 2011-07-14 NOTE — Telephone Encounter (Signed)
Spoke with pt wife, she reports the pt is refusing to take any of his meds. He took his aspirin this morning but refuses to take anything else today. She also requested DNR paperwork. Paperwork placed at the front desk for pick up. Dr Jens Som made aware.

## 2011-07-14 NOTE — Telephone Encounter (Signed)
I spoke with pt and she would like [place] to call [facility] in [place] at [phone].

## 2011-07-14 NOTE — Progress Notes (Signed)
Patient's wife presented today requesting no CODE BLUE status. She states her husband is now refusing to take all of his medications. She states he is "tired of being sick". She states he feels like he does not want to live anymore. She feels much of this is related to his Parkinson's. She also feels that he is capable of making his own decisions. We therefore provided no CODE BLUE status including no intubation, defibrillation or CPR. I offered for him to be seen in the office if she felt necessary. She will contact us if we can provide assistance. She is also contacting neurology and primary care. Olga Millers

## 2011-07-14 NOTE — Telephone Encounter (Signed)
Pt's wife is calling back to state that pt still has dry mouth and was put on a new heart medication called Imdur.  Pt's wife states pt told her he is frustrated and tired of taking all of his medication.  Per pt's wife pt states he is physically at the pint where he is going to stop all medication and pt's wife states she is concerned.  Pt's wife states pt is in the process of switching to a new pcp in [place].  Pt's wife states the DNR was asked for in the hospital but was never received.  Pls advise.

## 2011-07-14 NOTE — Telephone Encounter (Signed)
We were able to pull a copy of his DNR from the computer, and she will pick this up. It is his right to refuse his medications but I urge him not to do so. Hopefully he can see his new PCP quickly.

## 2011-07-14 NOTE — Telephone Encounter (Signed)
New msg Pt's wife wants to talk to you. He was in hospital and came home on Monday. She said he doesn't want to take his meds. She is also asking to have DNR completed. Please call

## 2011-07-15 ENCOUNTER — Telehealth: Payer: Self-pay | Admitting: Cardiology

## 2011-07-15 NOTE — Telephone Encounter (Signed)
I am waiting on a fax from home health. Dr. [name] will fill out referral and they will get in touch with pt. Call pt when this is done.

## 2011-07-15 NOTE — Telephone Encounter (Signed)
Pt wife is callback again concerning seroquel and clonazepam

## 2011-07-15 NOTE — Telephone Encounter (Signed)
Wife is calling back about the meds, and wanting to taper husband off them. She is getting ready to give him the a.m. Meds, and wants to know what to do. Please call her ASAP at [phone]

## 2011-07-15 NOTE — Telephone Encounter (Signed)
Pts wife called and pt wants to know how he can come off off the Seroquell and Clonazepam.

## 2011-07-15 NOTE — Telephone Encounter (Signed)
Spoke with pt wife, questions regarding stopping cardiac meds answered

## 2011-07-15 NOTE — Telephone Encounter (Signed)
Wife informed it will be 5-24 before message is answered- dr fry has gone for the day

## 2011-07-15 NOTE — Telephone Encounter (Signed)
Spoke with pt wife, questions answered. 

## 2011-07-15 NOTE — Telephone Encounter (Signed)
New problem:  Patient wife calling, put up DNR form on yesterday, has some question.

## 2011-07-16 NOTE — Telephone Encounter (Signed)
I spoke with pt's wife and gave below information.

## 2011-07-16 NOTE — Telephone Encounter (Signed)
As for the seroquel, just stop it immediately. As for the clonazepam decrease this to twice a day for one week, then stop it

## 2011-07-20 ENCOUNTER — Other Ambulatory Visit: Payer: Medicare Other

## 2011-07-20 ENCOUNTER — Telehealth: Payer: Self-pay | Admitting: Family Medicine

## 2011-07-20 NOTE — Telephone Encounter (Signed)
Nursing is going out to the house to check BP, etc. Medical social worker is also working with them. However, Heather Roberts from [facility] states that pt does NOT want a home health aid at the house, helping with baths, etc. She feels he would benefit from in-home OT, especially if he is refusing an aid. She just wants you to be aware that she has gone out and assessed pt. Requesting both PT & OT for eval & treat. Thanks! Cathy's #: [phone]

## 2011-07-21 ENCOUNTER — Telehealth: Payer: Self-pay | Admitting: Family Medicine

## 2011-07-21 NOTE — Telephone Encounter (Signed)
[name] from [facility] [facility] called to see if Dr. [name] has approved the PT/OT consult for the patient. Please advise.

## 2011-07-22 ENCOUNTER — Other Ambulatory Visit (HOSPITAL_COMMUNITY): Payer: Medicare Other

## 2011-07-22 NOTE — Telephone Encounter (Signed)
I tried to call pt at home, no answer.

## 2011-07-23 NOTE — Telephone Encounter (Signed)
noted 

## 2011-07-23 NOTE — Telephone Encounter (Signed)
I spoke with pt's wife and pt does not want to do the therapy now.

## 2011-08-04 DIAGNOSIS — G20 Parkinson's disease: Secondary | ICD-10-CM

## 2011-08-09 ENCOUNTER — Other Ambulatory Visit: Payer: Self-pay | Admitting: Internal Medicine

## 2011-08-11 ENCOUNTER — Encounter: Payer: Self-pay | Admitting: Cardiology

## 2011-08-11 ENCOUNTER — Ambulatory Visit (INDEPENDENT_AMBULATORY_CARE_PROVIDER_SITE_OTHER): Payer: Medicare Other | Admitting: Cardiology

## 2011-08-11 VITALS — BP 100/60 | HR 74 | Ht 75.0 in | Wt 232.0 lb

## 2011-08-11 DIAGNOSIS — E785 Hyperlipidemia, unspecified: Secondary | ICD-10-CM

## 2011-08-11 DIAGNOSIS — I2510 Atherosclerotic heart disease of native coronary artery without angina pectoris: Secondary | ICD-10-CM

## 2011-08-11 DIAGNOSIS — I4891 Unspecified atrial fibrillation: Secondary | ICD-10-CM

## 2011-08-11 DIAGNOSIS — Z9889 Other specified postprocedural states: Secondary | ICD-10-CM

## 2011-08-11 DIAGNOSIS — I951 Orthostatic hypotension: Secondary | ICD-10-CM

## 2011-08-11 MED ORDER — PRAVASTATIN SODIUM 40 MG PO TABS: 40.0000 mg | ORAL_TABLET | Freq: Every evening | ORAL | Status: DC

## 2011-08-11 NOTE — Assessment & Plan Note (Addendum)
Continued amiodarone. Continue aspirin. Given history of falls and Parkinson's I do not think he is a good candidate for Coumadin. Check liver functions, TSH and chest x-ray in 6 weeks.

## 2011-08-11 NOTE — Patient Instructions (Addendum)
Your physician wants you to follow-up in: 6 MONTHS WITH DR Jens Som IN [place] You will receive a reminder letter in the mail two months in advance. If you don't receive a letter, please call our office to schedule the follow-up appointment.   START PRAVASTATIN 40 MG ONCE DAILY AT BEDTIME  A chest x-ray takes a picture of the organs and structures inside the chest, including the heart, lungs, and blood vessels. This test can show several things, including, whether the heart is enlarges; whether fluid is building up in the lungs; and whether pacemaker / defibrillator leads are still in place. IN [place] OFFICE IN 6 WEEKS  Your physician recommends that you return for lab work in: 6 WEEKS IN [place]=DO NOT EAT PRIOR TO LAB WORK

## 2011-08-11 NOTE — Assessment & Plan Note (Signed)
No recent symptoms

## 2011-08-11 NOTE — Progress Notes (Signed)
HPI: Pleasant male with past medical history of coronary artery disease and atrial fibrillation for followup. Patient admitted in July of 2011 with chest pain. Cardiac catheterization revealed an ejection fraction of 55-60% and significant mitral regurgitation. The right coronary artery had no significant stenosis. There was a 40% circumflex. The proximal portion of the left anterior descending artery has about 90% narrowing. Echocardiogram showed normal LV function and severe mitral regurgitation. On July 12 of 2011 the patient underwent two-vessel coronary artery bypass grafting (left internal mammary artery to LAD, saphenous vein graft to obtuse marginal) and MV repair with a 22-mm Edwards ring and oversewing of left atrial appendage. Patient also with orthostatic hypotension. carotid Dopplers in April of 2013 showed 0-39% bilateral stenosis and followup recommended in one year. Echocardiogram repeated in May 2013. This revealed an ejection fraction of 60-65%. There was mitral valve repair. Mild mitral regurgitation. The left atrium was severely dilated. Patient also with history of paroxysmal atrial fibrillation. Patient apparently decided in may that he did not want to live and stopped taking all of his medicines. He was made a no CODE BLUE after discussions with his wife. Since that time, he apparently has improved. He denies dyspnea, chest pain, palpitations, syncope or pedal edema. He does have occasional weakness.   Current Outpatient Prescriptions  Medication Sig Dispense Refill  . amiodarone (PACERONE) 200 MG tablet Take 1 tablet (200 mg total) by mouth daily.      Marland Kitchen aspirin EC 81 MG tablet Take 81 mg by mouth daily.      . carbidopa-levodopa (SINEMET) 25-100 MG per tablet Take 1 tablet by mouth 3 (three) times daily. 1 tablet at breakfast and lunch      . clonazePAM (KLONOPIN) 1 MG tablet Take 1 mg by mouth every 6 (six) hours.      . Multiple Vitamin (MULITIVITAMIN WITH MINERALS) TABS Take  1 tablet by mouth daily.      . nitroGLYCERIN (NITROSTAT) 0.4 MG SL tablet Place 0.4 mg under the tongue every 5 (five) minutes as needed.      . polyethylene glycol (MIRALAX / GLYCOLAX) packet Take 17 g by mouth daily as needed. For constipation       . promethazine (PHENERGAN) 25 MG tablet Take 25 mg by mouth every 6 (six) hours as needed. For nausea        . PROTONIX 40 MG tablet TAKE 1 TABLET TWICE A DAY  60 each  11  . sertraline (ZOLOFT) 100 MG tablet Take 50 mg by mouth at bedtime.       . Tamsulosin HCl (FLOMAX) 0.4 MG CAPS Take 0.4 mg by mouth daily after breakfast.      . QUEtiapine (SEROQUEL) 50 MG tablet Take 1 tablet (50 mg total) by mouth at bedtime.  30 tablet  2     Past Medical History  Diagnosis Date  . Atrial fibrillation     Paroxysmal, not Coumadin candidate 2/2 Parkinson's/gait instability  . PE (pulmonary embolism)     >20 years ago  . DVT (deep venous thrombosis)   . Meningioma   . BPH (benign prostatic hyperplasia)   . Personal history of colonic polyps   . Venereal wart   . Depression   . Asthma   . Chickenpox   . Thrombophlebitis   . GERD (gastroesophageal reflux disease)   . Parkinson disease   . Dementia   . CAD (coronary artery disease)     s/p CABG x 2 08/2009 (LIMA->LAD,  SVG-OM)  . Hyperlipidemia   . Stricture and stenosis of esophagus   . Hiatal hernia   . Diverticulosis of colon (without mention of hemorrhage)   . Mitral regurgitation     s/p MV repair 22mm Edwards ring and oversewing of LA appendage in 07/11    Past Surgical History  Procedure Date  . Inguinal hernia repair   . Tonsilectomy, adenoidectomy, bilateral myringotomy and tubes   . Varicose vein surgery     left leg  . Back surgery     herniated disc & remove bone spurs  . Coronary artery bypass graft   . Mitral valve repair   . Umbilical hernia repair   . External ear surgery     History   Social History  . Marital Status: Married    Spouse Name: N/A    Number of  Children: 2  . Years of Education: N/A   Occupational History  .      Retired   Social History Main Topics  . Smoking status: Former Games developer  . Smokeless tobacco: Never Used  . Alcohol Use: No  . Drug Use: No  . Sexually Active: No   Other Topics Concern  . Not on file   Social History Narrative  . No narrative on file    ROS: insomnia and weakness but no fevers or chills, productive cough, hemoptysis, dysphasia, odynophagia, melena, hematochezia, dysuria, hematuria, rash, seizure activity, orthopnea, PND, pedal edema, claudication. Remaining systems are negative.  Physical Exam: Well-developed well-nourished in no acute distress.  Skin is warm and dry.  HEENT is normal.  Neck is supple.  Chest is clear to auscultation with normal expansion.  Cardiovascular exam is regular rate and rhythm. 2/6 systolic murmur left sternal border Abdominal exam nontender or distended. No masses palpated. Extremities show no edema. neuro consistent with Parkinson's  ECG sinus rhythm at a rate of 74. No ST changes. Prolonged QT interval.

## 2011-08-11 NOTE — Assessment & Plan Note (Signed)
Continue SBE prophylaxis. 

## 2011-08-11 NOTE — Assessment & Plan Note (Signed)
Resume Pravachol 40 mg daily. Check lipids and liver in 6 weeks. 

## 2011-08-11 NOTE — Assessment & Plan Note (Signed)
Continue aspirin. Resume Pravachol 40 mg daily. Check lipids and liver in 6 weeks. Given recent admissions with chest pain arrange [facility] for risk stratification.

## 2011-08-14 ENCOUNTER — Other Ambulatory Visit: Payer: Self-pay | Admitting: Internal Medicine

## 2011-08-16 NOTE — Telephone Encounter (Signed)
Please advise if this is your patient.\SLS Thanks.

## 2011-08-17 NOTE — Telephone Encounter (Signed)
Call in one year supply  

## 2011-08-19 ENCOUNTER — Telehealth: Payer: Self-pay | Admitting: Family Medicine

## 2011-08-19 NOTE — Telephone Encounter (Signed)
Pts spouse called and said that they would like to come back to Dr [name] because they are not satisfied with other physician. Pls advise if ok to re-est.

## 2011-08-20 NOTE — Telephone Encounter (Signed)
Called pts spouse and notified them that Dr [name] has agreed to have them re-est with him. Pts spouse will call back and sch ov with Dr [name] after next wk, when pcp returns back to office.

## 2011-08-20 NOTE — Telephone Encounter (Signed)
Yes I would be happy to see them again

## 2011-09-01 ENCOUNTER — Telehealth: Payer: Self-pay | Admitting: Family Medicine

## 2011-09-01 NOTE — Telephone Encounter (Signed)
Can you call pt and give this info.

## 2011-09-01 NOTE — Telephone Encounter (Signed)
Called pts wife and notified her that Dr [name] said that he would take care of jury letter when pt comes in for ov on 09/20/11.

## 2011-09-01 NOTE — Telephone Encounter (Signed)
We can do this on the day of his appt

## 2011-09-01 NOTE — Telephone Encounter (Signed)
Pts wife called and said that her spouse needs to get a note to be excused from jury duty due to medical condition. Pls mail note to pts home address or pt has appt on 09/20/11 to see Dr [name] and can pick up note then.

## 2011-09-14 IMAGING — CR DG CHEST 1V PORT
1 series · 1 of 1 positions shown · non-contrast
Comparison: 09/02/2009.

CLINICAL DATA: Status post CABG.

PORTABLE CHEST - 1 VIEW

[AP]
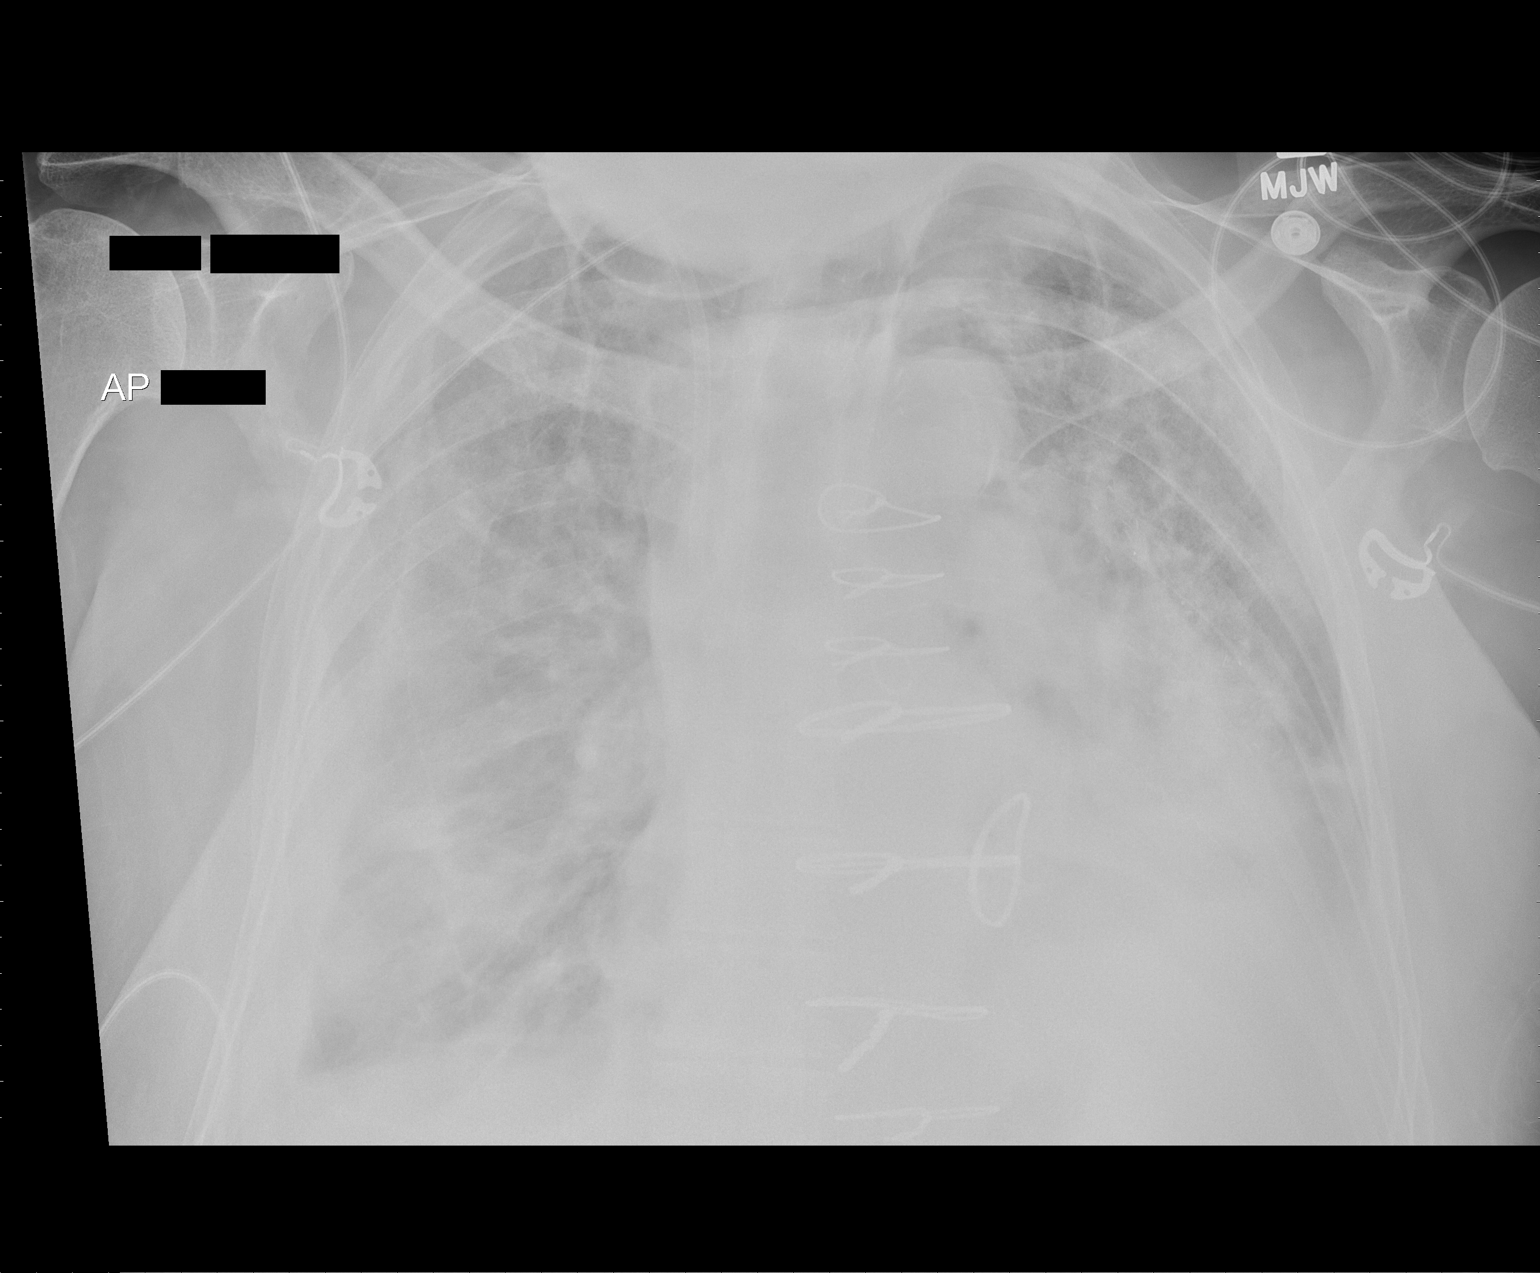

[1 of 1 positions shown; findings below may reference images not displayed]

FINDINGS: A right IJ sheath remains in place following removal of a
Swan-Ganz catheter.  The mitral valve annulus is in place.  A left-
sided chest tube has been removed.  There is no pneumothorax.
Bilateral pleural effusions and edema persist, worse on the left
than previously seen.  Bilateral lower lobe airspace disease is
also present, worse on the left.  Moderate edema has increased.
IMPRESSION: 1.  Interval increase in interstitial edema and bilateral pleural
effusions.
2.  Increasing airspace disease, worse on the left.  While this is
likely atelectasis associated with the edema and effusions,
infection is not excluded.
3.  Interval removal of left-sided chest tube without pneumothorax.
4.  Interval removal of Swan-Ganz catheter.  The right IJ sheath
remains

## 2011-09-16 IMAGING — CR DG CHEST 1V PORT
1 series · 1 of 1 positions shown · non-contrast
Comparison: Portable chest x-ray of 09/04/2009

CLINICAL DATA: CABG, follow-up

PORTABLE CHEST - 1 VIEW

[AP]
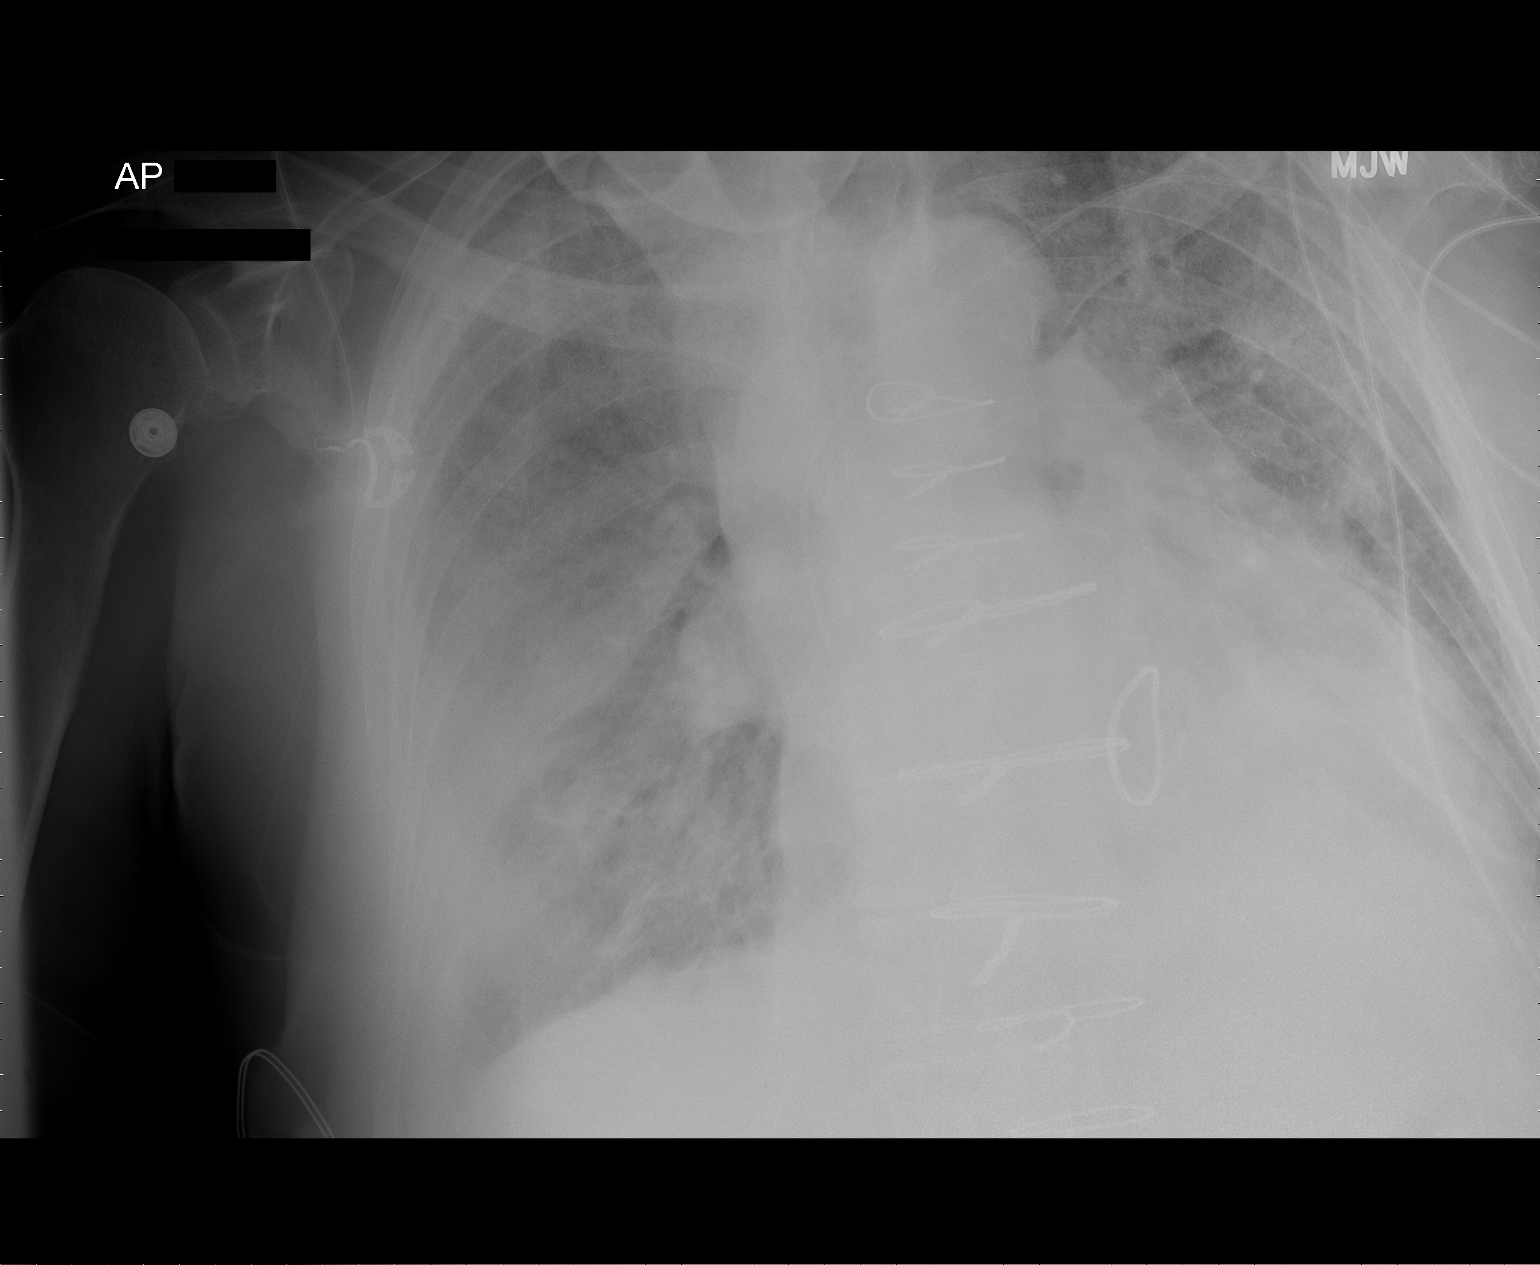

[1 of 1 positions shown; findings below may reference images not displayed]

FINDINGS: Diffuse airspace disease again is noted most consistent
with edema.  Cardiomegaly is stable.  Median sternotomy sutures are
noted.
IMPRESSION: No significant change in cardiomegaly and diffuse airspace disease
most consistent with edema.

## 2011-09-20 ENCOUNTER — Encounter: Payer: Self-pay | Admitting: Family Medicine

## 2011-09-20 ENCOUNTER — Ambulatory Visit (INDEPENDENT_AMBULATORY_CARE_PROVIDER_SITE_OTHER): Payer: Medicare Other | Admitting: Family Medicine

## 2011-09-20 VITALS — BP 108/60 | HR 84 | Temp 98.0°F | Wt 236.0 lb

## 2011-09-20 DIAGNOSIS — G20 Parkinson's disease: Secondary | ICD-10-CM

## 2011-09-20 DIAGNOSIS — F341 Dysthymic disorder: Secondary | ICD-10-CM

## 2011-09-20 DIAGNOSIS — F418 Other specified anxiety disorders: Secondary | ICD-10-CM

## 2011-09-20 DIAGNOSIS — F0390 Unspecified dementia without behavioral disturbance: Secondary | ICD-10-CM

## 2011-09-20 MED ORDER — DESVENLAFAXINE SUCCINATE ER 50 MG PO TB24: 50.0000 mg | ORAL_TABLET | Freq: Every day | ORAL | Status: DC

## 2011-09-20 NOTE — Progress Notes (Signed)
  Subjective:    Patient ID: [name], male    DOB: 03/04/1940, 71 y.o.   MRN: [id number]  HPI Here with his wife to re-establish and to discuss depression. He saw an [place] doctor once but decided to come back to see [place]. He has been doing quite well physically, and he had recent good reports from Dr. Corky Crafts and Dr. Jens Som. He is up walking around with a cane and not using his wheelchair. He has been off Zoloft and Seroquel for about 3 months now, however, and his depression is coming back. He feels sad and gets tearful at times. He would like to try something different than before.    Review of Systems  Constitutional: Negative.   Respiratory: Negative.   Cardiovascular: Negative.   Psychiatric/Behavioral: Positive for dysphoric mood and decreased concentration. Negative for suicidal ideas, hallucinations, confusion and agitation. The patient is nervous/anxious.        Objective:   Physical Exam  Constitutional: He is oriented to person, place, and time. He appears well-developed and well-nourished.  Neurological: He is alert and oriented to person, place, and time.  Psychiatric: He has a normal mood and affect. His behavior is normal. Judgment and thought content normal.          Assessment & Plan:  For his depression I gave him samples to try Pristiq 50 mg a day. He will follow up in one month.

## 2011-09-28 ENCOUNTER — Telehealth: Payer: Self-pay | Admitting: Cardiology

## 2011-09-28 NOTE — Telephone Encounter (Signed)
Spoke with pt wife, questions regarding labs and cxr in [place] office. Lab orders mailed to pt.

## 2011-09-28 NOTE — Telephone Encounter (Signed)
Please return call to patient [phone] regarding patient care (Chest X Ray)

## 2011-09-29 ENCOUNTER — Telehealth: Payer: Self-pay | Admitting: Cardiology

## 2011-09-29 NOTE — Telephone Encounter (Signed)
PT IN A-FIB PER PT'S WIFE, SPRAYED NITRO IN MOUTH 15 MINUTES AGO, STILL HAS SOME PAIN PLS CALL ASAP

## 2011-09-29 NOTE — Telephone Encounter (Signed)
Spoke with pt' wife who reports pt went into atrial fib about 2:30 this afternoon after showering. He is now lying down and complains of numbness all over, inability to see and chest pain.  Has used NTG spray twice.  I instructed wife to call 911 to activate EMS. She is agreeable with this plan.

## 2011-10-04 ENCOUNTER — Inpatient Hospital Stay (HOSPITAL_COMMUNITY)
Admission: EM | Admit: 2011-10-04 | Discharge: 2011-10-08 | DRG: 287 | Disposition: A | Discharge status: Home / Self Care | Payer: Medicare Other | Attending: Cardiology | Admitting: Cardiology

## 2011-10-04 ENCOUNTER — Encounter (HOSPITAL_COMMUNITY): Payer: Self-pay | Admitting: Emergency Medicine

## 2011-10-04 ENCOUNTER — Emergency Department (HOSPITAL_COMMUNITY): Payer: Medicare Other

## 2011-10-04 DIAGNOSIS — Z86718 Personal history of other venous thrombosis and embolism: Secondary | ICD-10-CM

## 2011-10-04 DIAGNOSIS — D649 Anemia, unspecified: Secondary | ICD-10-CM | POA: Diagnosis present

## 2011-10-04 DIAGNOSIS — F068 Other specified mental disorders due to known physiological condition: Secondary | ICD-10-CM | POA: Diagnosis present

## 2011-10-04 DIAGNOSIS — F3289 Other specified depressive episodes: Secondary | ICD-10-CM | POA: Diagnosis present

## 2011-10-04 DIAGNOSIS — I10 Essential (primary) hypertension: Secondary | ICD-10-CM | POA: Diagnosis present

## 2011-10-04 DIAGNOSIS — I4891 Unspecified atrial fibrillation: Secondary | ICD-10-CM | POA: Diagnosis present

## 2011-10-04 DIAGNOSIS — Z87891 Personal history of nicotine dependence: Secondary | ICD-10-CM

## 2011-10-04 DIAGNOSIS — Z860100 Personal history of colon polyps, unspecified: Secondary | ICD-10-CM

## 2011-10-04 DIAGNOSIS — J45909 Unspecified asthma, uncomplicated: Secondary | ICD-10-CM | POA: Diagnosis present

## 2011-10-04 DIAGNOSIS — Z951 Presence of aortocoronary bypass graft: Secondary | ICD-10-CM

## 2011-10-04 DIAGNOSIS — I2510 Atherosclerotic heart disease of native coronary artery without angina pectoris: Principal | ICD-10-CM | POA: Diagnosis present

## 2011-10-04 DIAGNOSIS — I513 Intracardiac thrombosis, not elsewhere classified: Secondary | ICD-10-CM

## 2011-10-04 DIAGNOSIS — K219 Gastro-esophageal reflux disease without esophagitis: Secondary | ICD-10-CM | POA: Diagnosis present

## 2011-10-04 DIAGNOSIS — Z87898 Personal history of other specified conditions: Secondary | ICD-10-CM | POA: Diagnosis present

## 2011-10-04 DIAGNOSIS — E785 Hyperlipidemia, unspecified: Secondary | ICD-10-CM | POA: Diagnosis present

## 2011-10-04 DIAGNOSIS — K449 Diaphragmatic hernia without obstruction or gangrene: Secondary | ICD-10-CM | POA: Diagnosis present

## 2011-10-04 DIAGNOSIS — Z9889 Other specified postprocedural states: Secondary | ICD-10-CM

## 2011-10-04 DIAGNOSIS — F0390 Unspecified dementia without behavioral disturbance: Secondary | ICD-10-CM | POA: Diagnosis present

## 2011-10-04 DIAGNOSIS — F329 Major depressive disorder, single episode, unspecified: Secondary | ICD-10-CM | POA: Diagnosis present

## 2011-10-04 DIAGNOSIS — G20 Parkinson's disease: Secondary | ICD-10-CM | POA: Diagnosis present

## 2011-10-04 DIAGNOSIS — G20A1 Parkinson's disease without dyskinesia, without mention of fluctuations: Secondary | ICD-10-CM | POA: Diagnosis present

## 2011-10-04 DIAGNOSIS — I200 Unstable angina: Secondary | ICD-10-CM

## 2011-10-04 HISTORY — DX: Intracardiac thrombosis, not elsewhere classified: I51.3

## 2011-10-04 LAB — BASIC METABOLIC PANEL
CO2: 24 mEq/L (ref 19–32)
Chloride: 105 mEq/L (ref 96–112)
Creatinine, Ser: 1.35 mg/dL (ref 0.50–1.35)
GFR calc Af Amer: 60 mL/min — ABNORMAL LOW (ref >90)
Potassium: 4.6 mEq/L (ref 3.5–5.1)
Sodium: 138 mEq/L (ref 135–145)

## 2011-10-04 LAB — URINALYSIS, ROUTINE W REFLEX MICROSCOPIC
Glucose, UA: NEGATIVE mg/dL
Hgb urine dipstick: NEGATIVE
Ketones, ur: NEGATIVE mg/dL
Leukocytes, UA: NEGATIVE
pH: 7 (ref 5.0–8.0)

## 2011-10-04 LAB — PRO B NATRIURETIC PEPTIDE: Pro B Natriuretic peptide (BNP): 656.8 pg/mL — ABNORMAL HIGH (ref 0–125)

## 2011-10-04 LAB — POCT I-STAT TROPONIN I: Troponin i, poc: 0.01 ng/mL (ref 0.00–0.08)

## 2011-10-04 LAB — CBC
Platelets: 221 10*3/uL (ref 150–400)
RBC: 4.15 MIL/uL — ABNORMAL LOW (ref 4.22–5.81)
RDW: 16.9 % — ABNORMAL HIGH (ref 11.5–15.5)
WBC: 6.9 10*3/uL (ref 4.0–10.5)

## 2011-10-04 LAB — CARDIAC PANEL(CRET KIN+CKTOT+MB+TROPI)
Relative Index: INVALID (ref 0.0–2.5)
Total CK: 54 U/L (ref 7–232)

## 2011-10-04 MED ORDER — ASPIRIN 81 MG PO CHEW
324.0000 mg | CHEWABLE_TABLET | ORAL | Status: AC
  Administered 2011-10-05: 324 mg via ORAL
  Filled 2011-10-04: qty 4

## 2011-10-04 MED ORDER — AMIODARONE HCL 200 MG PO TABS
200.0000 mg | ORAL_TABLET | Freq: Every day | ORAL | Status: DC
  Administered 2011-10-05 – 2011-10-08 (×4): 200 mg via ORAL
  Filled 2011-10-04 (×4): qty 1

## 2011-10-04 MED ORDER — LORAZEPAM 2 MG/ML IJ SOLN
1.0000 mg | Freq: Once | INTRAMUSCULAR | Status: AC
  Administered 2011-10-04: 1 mg via INTRAVENOUS
  Filled 2011-10-04: qty 1

## 2011-10-04 MED ORDER — NITROGLYCERIN 2 % TD OINT
1.0000 [in_us] | TOPICAL_OINTMENT | Freq: Four times a day (QID) | TRANSDERMAL | Status: DC
  Administered 2011-10-05 – 2011-10-08 (×14): 1 [in_us] via TOPICAL
  Filled 2011-10-04: qty 30

## 2011-10-04 MED ORDER — ONDANSETRON HCL 4 MG/2ML IJ SOLN: 4.0000 mg | Freq: Four times a day (QID) | INTRAMUSCULAR | Status: DC | PRN

## 2011-10-04 MED ORDER — SODIUM CHLORIDE 0.9 % IJ SOLN
3.0000 mL | Freq: Two times a day (BID) | INTRAMUSCULAR | Status: DC
  Administered 2011-10-05 – 2011-10-08 (×7): 3 mL via INTRAVENOUS

## 2011-10-04 MED ORDER — PROMETHAZINE HCL 25 MG PO TABS: 25.0000 mg | ORAL_TABLET | Freq: Four times a day (QID) | ORAL | Status: DC | PRN

## 2011-10-04 MED ORDER — HEPARIN BOLUS VIA INFUSION
4000.0000 [IU] | Freq: Once | INTRAVENOUS | Status: AC
  Administered 2011-10-04: 4000 [IU] via INTRAVENOUS

## 2011-10-04 MED ORDER — SODIUM CHLORIDE 0.9 % IJ SOLN: 3.0000 mL | INTRAMUSCULAR | Status: DC | PRN

## 2011-10-04 MED ORDER — SIMVASTATIN 20 MG PO TABS
20.0000 mg | ORAL_TABLET | Freq: Every day | ORAL | Status: DC
  Administered 2011-10-06 – 2011-10-07 (×2): 20 mg via ORAL
  Filled 2011-10-04 (×4): qty 1

## 2011-10-04 MED ORDER — VENLAFAXINE HCL ER 75 MG PO CP24
75.0000 mg | ORAL_CAPSULE | Freq: Every day | ORAL | Status: DC
  Administered 2011-10-05 – 2011-10-08 (×4): 75 mg via ORAL
  Filled 2011-10-04 (×6): qty 1

## 2011-10-04 MED ORDER — CLONAZEPAM 1 MG PO TABS
1.0000 mg | ORAL_TABLET | Freq: Four times a day (QID) | ORAL | Status: DC
  Administered 2011-10-05 – 2011-10-08 (×13): 1 mg via ORAL
  Filled 2011-10-04 (×13): qty 1

## 2011-10-04 MED ORDER — CARBIDOPA-LEVODOPA 25-100 MG PO TABS
1.0000 | ORAL_TABLET | Freq: Three times a day (TID) | ORAL | Status: DC
  Administered 2011-10-05 – 2011-10-08 (×10): 1 via ORAL
  Filled 2011-10-04 (×13): qty 1

## 2011-10-04 MED ORDER — SODIUM CHLORIDE 0.9 % IV SOLN
1.0000 mL/kg/h | INTRAVENOUS | Status: DC
  Administered 2011-10-05: 0.994 mL/kg/h via INTRAVENOUS

## 2011-10-04 MED ORDER — NITROGLYCERIN 0.4 MG SL SUBL
0.4000 mg | SUBLINGUAL_TABLET | SUBLINGUAL | Status: DC | PRN
  Filled 2011-10-04: qty 75

## 2011-10-04 MED ORDER — HEPARIN (PORCINE) IN NACL 100-0.45 UNIT/ML-% IJ SOLN
1700.0000 [IU]/h | INTRAMUSCULAR | Status: DC
  Administered 2011-10-04: 1350 [IU]/h via INTRAVENOUS
  Administered 2011-10-05: 1700 [IU]/h via INTRAVENOUS
  Administered 2011-10-05: 1350 [IU]/h via INTRAVENOUS
  Filled 2011-10-04 (×4): qty 250

## 2011-10-04 MED ORDER — POLYETHYLENE GLYCOL 3350 17 G PO PACK
17.0000 g | PACK | Freq: Every day | ORAL | Status: DC | PRN
  Filled 2011-10-04: qty 1

## 2011-10-04 MED ORDER — PANTOPRAZOLE SODIUM 40 MG PO TBEC
40.0000 mg | DELAYED_RELEASE_TABLET | Freq: Two times a day (BID) | ORAL | Status: DC
Start: 2011-10-04 — End: 2011-10-08
  Administered 2011-10-05 – 2011-10-08 (×7): 40 mg via ORAL
  Filled 2011-10-04 (×7): qty 1

## 2011-10-04 MED ORDER — TAMSULOSIN HCL 0.4 MG PO CAPS
0.4000 mg | ORAL_CAPSULE | Freq: Every day | ORAL | Status: DC
  Administered 2011-10-05 – 2011-10-08 (×4): 0.4 mg via ORAL
  Filled 2011-10-04 (×4): qty 1

## 2011-10-04 MED ORDER — ASPIRIN 81 MG PO CHEW
81.0000 mg | CHEWABLE_TABLET | Freq: Once | ORAL | Status: AC
  Administered 2011-10-04: 81 mg via ORAL
  Filled 2011-10-04: qty 1

## 2011-10-04 MED ORDER — ASPIRIN 81 MG PO CHEW
162.0000 mg | CHEWABLE_TABLET | Freq: Once | ORAL | Status: AC
  Administered 2011-10-04: 162 mg via ORAL
  Filled 2011-10-04: qty 2

## 2011-10-04 MED ORDER — SODIUM CHLORIDE 0.9 % IV SOLN
250.0000 mL | INTRAVENOUS | Status: DC | PRN
  Administered 2011-10-06: 10 mL/h via INTRAVENOUS

## 2011-10-04 MED ORDER — ADULT MULTIVITAMIN W/MINERALS CH
1.0000 | ORAL_TABLET | Freq: Every day | ORAL | Status: DC
  Administered 2011-10-05 – 2011-10-08 (×4): 1 via ORAL
  Filled 2011-10-04 (×4): qty 1

## 2011-10-04 MED ORDER — ACETAMINOPHEN 325 MG PO TABS
650.0000 mg | ORAL_TABLET | ORAL | Status: DC | PRN
  Administered 2011-10-05: 650 mg via ORAL
  Filled 2011-10-04 (×2): qty 2

## 2011-10-04 MED ORDER — ASPIRIN EC 81 MG PO TBEC
81.0000 mg | DELAYED_RELEASE_TABLET | Freq: Every day | ORAL | Status: DC
  Administered 2011-10-06 – 2011-10-08 (×3): 81 mg via ORAL
  Filled 2011-10-04 (×4): qty 1

## 2011-10-04 NOTE — Progress Notes (Signed)
p ANTICOAGULATION CONSULT NOTE - Initial Consult  Pharmacy Consult for Heparin Indication: chest pain/ACS  Allergies  Allergen Reactions  . Ibuprofen     Stomach hurts    Patient Measurements: Height: 6\' 3"  (190.5 cm) Weight: 235 lb (106.595 kg) IBW/kg (Calculated) : 84.5  Heparin Dosing Weight: 106 kg  Vital Signs: Temp: 97.8 F (36.6 C) (08/12 1855) Temp src: Oral (08/12 1855) BP: 133/79 mmHg (08/12 1855) Pulse Rate: 73  (08/12 1855)  Labs:  Basename 10/04/11 1637  HGB 10.3*  HCT 33.6*  PLT 221  APTT --  LABPROT --  INR --  HEPARINUNFRC --  CREATININE 1.35  CKTOTAL --  CKMB --  TROPONINI --    Estimated Creatinine Clearance: 67.2 ml/min (by C-G formula based on Cr of 1.35).   Medical History: Past Medical History  Diagnosis Date  . Atrial fibrillation     a. Paroxysmal, not Coumadin candidate 2/2 Parkinson's/gait instability  . PE (pulmonary embolism)     a. >20 years ago;  b. nl V:Q scan 10/2010  . DVT (deep venous thrombosis)   . Meningioma   . BPH (benign prostatic hyperplasia)   . Personal history of colonic polyps   . Venereal wart   . Depression   . Asthma   . Chickenpox   . Thrombophlebitis   . GERD (gastroesophageal reflux disease)   . Parkinson disease   . Dementia   . CAD (coronary artery disease)     a. 08/2009 s/p CABG x 2 (LIMA->LAD, SVG-OM)  . Hyperlipidemia   . Stricture and stenosis of esophagus   . Hiatal hernia   . Diverticulosis of colon (without mention of hemorrhage)   . Mitral regurgitation     a. 08/2009 s/p MV repair 22mm Edwards ring and oversewing of LA appendage @ time of CABG;  b. 06/2011 Echo: EF 60-65% Mild MR.    Medications:  Infusions:    . heparin      Assessment: Chest Pain:  To continue anticoagulation with Heparin.  His current rate of 1000 units/hr is only ~9 units/kg/hr which is likely to be subtherapeutic.  In May 2013 he had a therapeutic Heparin level on 1350 units/hr.  Goal of Therapy:  Heparin  level 0.3-0.7 units/ml Monitor platelets by anticoagulation protocol: Yes   Plan:  Increase Heparin to 1350 units/hr Check Heparin level 8 hours after starting Heparin Daily Heparin level and CBC  Estella Husk, Pharm.D., BCPS Clinical Pharmacist  Phone [phone] Pager [phone] 10/04/2011, 7:16 PM

## 2011-10-04 NOTE — ED Notes (Signed)
Brought in by EMS for non radiating CP that started at home around 1400. Pt has SL nitro spray x2 prior to EMS arrival. Pt states he takes ASA daily. PMH of afib, pt describes the CP as "how it feels when my heart goes into atrial fibrillation" EMS gave SL nitro x1 prior to ED arrival.

## 2011-10-04 NOTE — ED Provider Notes (Signed)
History     CSN: [id number]  Arrival date & time 10/04/11  1611   First MD Initiated Contact with Patient 10/04/11 1636      Chief Complaint  Patient presents with  . Chest Pain    (Consider location/radiation/quality/duration/timing/severity/associated sxs/prior treatment) Patient is a 71 y.o. male presenting with chest pain. The history is provided by the patient.  Chest Pain The chest pain began 3 - 5 hours ago. Chest pain occurs intermittently (1 episode yesterday). The chest pain is improving. The pain is associated with exertion. At its most intense, the pain is at 10/10. The pain is currently at 1/10. The quality of the pain is described as pressure-like. The pain does not radiate. Primary symptoms include shortness of breath and cough. Pertinent negatives for primary symptoms include no fever, no syncope, no wheezing, no palpitations, no abdominal pain, no nausea, no vomiting and no dizziness.  The shortness of breath began today. The shortness of breath developed gradually. The shortness of breath is moderate.  The cough began 2 days ago. The cough is non-productive.  Pertinent negatives for associated symptoms include no lower extremity edema, no numbness, no orthopnea, no paroxysmal nocturnal dyspnea and no weakness. He tried nitroglycerin (Nitro SL x2) for the symptoms.  His past medical history is significant for CAD, DVT, hyperlipidemia, hypertension and PE.     Past Medical History  Diagnosis Date  . Atrial fibrillation     a. Paroxysmal, not Coumadin candidate 2/2 Parkinson's/gait instability  . PE (pulmonary embolism)     a. >20 years ago;  b. nl V:Q scan 10/2010  . DVT (deep venous thrombosis)   . Meningioma   . BPH (benign prostatic hyperplasia)   . Personal history of colonic polyps   . Venereal wart   . Depression   . Asthma   . Chickenpox   . Thrombophlebitis   . GERD (gastroesophageal reflux disease)   . Parkinson disease   . Dementia   . CAD (coronary  artery disease)     a. 08/2009 s/p CABG x 2 (LIMA->LAD, SVG-OM)  . Hyperlipidemia   . Stricture and stenosis of esophagus   . Hiatal hernia   . Diverticulosis of colon (without mention of hemorrhage)   . Mitral regurgitation     a. 08/2009 s/p MV repair 22mm Edwards ring and oversewing of LA appendage @ time of CABG;  b. 06/2011 Echo: EF 60-65% Mild MR.    Past Surgical History  Procedure Date  . Inguinal hernia repair   . Tonsilectomy, adenoidectomy, bilateral myringotomy and tubes   . Varicose vein surgery     left leg  . Back surgery     herniated disc & remove bone spurs  . Coronary artery bypass graft   . Mitral valve repair   . Umbilical hernia repair   . External ear surgery     Family History  Problem Relation Age of Onset  . Arthritis    . Diabetes    . Stroke    . Heart disease Mother     History  Substance Use Topics  . Smoking status: Former Games developer  . Smokeless tobacco: Never Used  . Alcohol Use: No      Review of Systems  Constitutional: Negative for fever and chills.  HENT: Negative.   Respiratory: Positive for cough and shortness of breath. Negative for wheezing.   Cardiovascular: Positive for chest pain. Negative for palpitations, orthopnea, leg swelling and syncope.  Gastrointestinal: Negative for nausea, vomiting,  abdominal pain and diarrhea.  Genitourinary: Negative.   Musculoskeletal: Negative.   Skin: Negative.   Neurological: Negative for dizziness, syncope, weakness, light-headedness and numbness.  All other systems reviewed and are negative.    Allergies  Ibuprofen  Home Medications   No current outpatient prescriptions on file.  BP 148/97  Pulse 77  Temp 97.8 F (36.6 C) (Oral)  Resp 14  Ht 6\' 3"  (1.905 m)  Wt 235 lb (106.595 kg)  BMI 29.37 kg/m2  SpO2 100%  Physical Exam  Nursing note and vitals reviewed. Constitutional: He is oriented to person, place, and time. He appears well-developed and well-nourished. No distress.   HENT:  Head: Normocephalic and atraumatic.  Eyes: Conjunctivae are normal.  Neck: Neck supple.  Cardiovascular: Normal rate, regular rhythm, normal heart sounds and intact distal pulses.   Pulmonary/Chest: Effort normal and breath sounds normal. He has no wheezes. He has no rales.  Abdominal: Soft. He exhibits no distension. There is no tenderness.  Musculoskeletal: Normal range of motion. He exhibits no edema and no tenderness.  Neurological: He is alert and oriented to person, place, and time.  Skin: Skin is warm and dry.    ED Course  Procedures (including critical care time)  Labs Reviewed  CBC - Abnormal; Notable for the following:    RBC 4.15 (*)     Hemoglobin 10.3 (*)     HCT 33.6 (*)     MCH 24.8 (*)     RDW 16.9 (*)     All other components within normal limits  BASIC METABOLIC PANEL - Abnormal; Notable for the following:    GFR calc non Af Amer 52 (*)     GFR calc Af Amer 60 (*)     All other components within normal limits  PRO B NATRIURETIC PEPTIDE - Abnormal; Notable for the following:    Pro B Natriuretic peptide (BNP) 656.8 (*)     All other components within normal limits  POCT I-STAT TROPONIN I  URINALYSIS, ROUTINE W REFLEX MICROSCOPIC  URINE CULTURE  HEPARIN LEVEL (UNFRACTIONATED)  CBC  CARDIAC PANEL(CRET KIN+CKTOT+MB+TROPI)  CARDIAC PANEL(CRET KIN+CKTOT+MB+TROPI)  CARDIAC PANEL(CRET KIN+CKTOT+MB+TROPI)  PRO B NATRIURETIC PEPTIDE  LIPID PANEL  BASIC METABOLIC PANEL  MRSA PCR SCREENING   Chest Portable 1 View  10/04/2011  *RADIOLOGY REPORT*  Clinical Data: Chest pain and pressure, shortness of breath  PORTABLE CHEST - 1 VIEW  Comparison: Chest x-ray of 07/11/2011 and CT angio chest of 07/11/2011  Findings: Cardiomegaly is stable.  There is prominence to the right hilum, which compared to the prior CT most likely is due to prominent pulmonary artery segment.  Mild volume loss is noted in the right mid lung.  No effusion is seen.  Median sternotomy  sutures are noted.  IMPRESSION: Moderate cardiomegaly.  Probable volume loss in the right midlung with prominent pulmonary artery segments most likely accounting for the prominence of the right hilum.  Original Report Authenticated By: Juline Patch, M.D.     1. Unstable angina       MDM  71 yo male with PMHx of CAD s/p CABG, PE, DVT, HTN, HLD who presents with 3 hour history of shortness of breath and chest pressure.  Sx improved after 2 nitro.  No LE edema or tenderness.  No orthopnea, PND.  Reports some increased cough for the past 2-3 days.  AF, VSS, NAD.  Presentation concerning for ACS or unstable angina.  EKG: rate 74, NSR, axis nml, QTc 501  otherwise nml intervals, no hypertrophy.  No ST or T-wave changes, similar to EKG from 07/11/11.  Initial troponin negative.  BNP elevated at 656.  In consultation with Cardiology will start pt on heparin.  Will admit to Cardiology for further evaluation.          Cherre Robins, MD 10/04/11 [phone]

## 2011-10-04 NOTE — H&P (Signed)
[name] is an 71 y.o. male.    Primary Cardiologist: Dr. Jens [name] PCP: Dr. Clent [name] Chief Complaint: Chest pain HPI: This 71 year old man presents to the ER with pattern of crescendo angina. He has known CAD and had CABG x2  And mitral valve repair in July 2011.  Patient has past history of paroxysmal atrial fib but not on coumadin because of fall risk from his parkinson's disease.  On 09/29/11 patient called office complaining of chest pain unrelieved by NTG and was advised to go to ER but did not.  Yesterday patient had chest pain and called 911 but then refused to go with EMS to ER.  This am awoke with chest discomfort which has waxed and waned and agreed to come to ER.  Discomfort is substernal and associated with shortness of breath.  When he has his chest pain he also complains of feeling numb all over.  Patient has history of significant depression and his PCP recently switched him to Pristiq. His Parkinson's is followed by Dr. Vickey [name].  Past Medical History  Diagnosis Date  . Atrial fibrillation     a. Paroxysmal, not Coumadin candidate 2/2 Parkinson's/gait instability  . PE (pulmonary embolism)     a. >20 years ago;  b. nl V:Q scan 10/2010  . DVT (deep venous thrombosis)   . Meningioma   . BPH (benign prostatic hyperplasia)   . Personal history of colonic polyps   . Venereal wart   . Depression   . Asthma   . Chickenpox   . Thrombophlebitis   . GERD (gastroesophageal reflux disease)   . Parkinson disease   . Dementia   . CAD (coronary artery disease)     a. 08/2009 s/p CABG x 2 (LIMA->LAD, SVG-OM)  . Hyperlipidemia   . Stricture and stenosis of esophagus   . Hiatal hernia   . Diverticulosis of colon (without mention of hemorrhage)   . Mitral regurgitation     a. 08/2009 s/p MV repair 22mm Edwards ring and oversewing of LA appendage @ time of CABG;  b. 06/2011 Echo: EF 60-65% Mild MR.    Past Surgical History  Procedure Date  . Inguinal hernia repair   . Tonsilectomy,  adenoidectomy, bilateral myringotomy and tubes   . Varicose vein surgery     left leg  . Back surgery     herniated disc & remove bone spurs  . Coronary artery bypass graft   . Mitral valve repair   . Umbilical hernia repair   . External ear surgery     Family History  Problem Relation Age of Onset  . Arthritis    . Diabetes    . Stroke    . Heart disease Mother    Social History:  reports that he has quit smoking. He has never used smokeless tobacco. He reports that he does not drink alcohol or use illicit drugs.  Allergies:  Allergies  Allergen Reactions  . Ibuprofen     Stomach hurts       Results for orders placed during the hospital encounter of 10/04/11 (from the past 48 hour(s))  CBC     Status: Abnormal   Collection Time   10/04/11  4:37 PM      Component Value Range Comment   WBC 6.9  4.0 - 10.5 K/uL    RBC 4.15 (*) 4.22 - 5.81 MIL/uL    Hemoglobin 10.3 (*) 13.0 - 17.0 g/dL    HCT 16.1 (*) 09.6 -  52.0 %    MCV 81.0  78.0 - 100.0 fL    MCH 24.8 (*) 26.0 - 34.0 pg    MCHC 30.7  30.0 - 36.0 g/dL    RDW 40.9 (*) 81.1 - 15.5 %    Platelets 221  150 - 400 K/uL   BASIC METABOLIC PANEL     Status: Abnormal   Collection Time   10/04/11  4:37 PM      Component Value Range Comment   Sodium 138  135 - 145 mEq/L    Potassium 4.6  3.5 - 5.1 mEq/L    Chloride 105  96 - 112 mEq/L    CO2 24  19 - 32 mEq/L    Glucose, Bld 96  70 - 99 mg/dL    BUN 12  6 - 23 mg/dL    Creatinine, Ser 9.14  0.50 - 1.35 mg/dL    Calcium 8.6  8.4 - 78.2 mg/dL    GFR calc non Af Amer 52 (*) >90 mL/min    GFR calc Af Amer 60 (*) >90 mL/min   PRO B NATRIURETIC PEPTIDE     Status: Abnormal   Collection Time   10/04/11  4:47 PM      Component Value Range Comment   Pro B Natriuretic peptide (BNP) 656.8 (*) 0 - 125 pg/mL   POCT I-STAT TROPONIN I     Status: Normal   Collection Time   10/04/11  5:00 PM      Component Value Range Comment   Troponin i, poc 0.01  0.00 - 0.08 ng/mL    Comment 3              Chest Portable 1 View  10/04/2011  *RADIOLOGY REPORT*  Clinical Data: Chest pain and pressure, shortness of breath  PORTABLE CHEST - 1 VIEW  Comparison: Chest x-ray of 07/11/2011 and CT angio chest of 07/11/2011  Findings: Cardiomegaly is stable.  There is prominence to the right hilum, which compared to the prior CT most likely is due to prominent pulmonary artery segment.  Mild volume loss is noted in the right mid lung.  No effusion is seen.  Median sternotomy sutures are noted.  IMPRESSION: Moderate cardiomegaly.  Probable volume loss in the right midlung with prominent pulmonary artery segments most likely accounting for the prominence of the right hilum.  Original Report Authenticated By: Juline Patch, M.D.    ROS: Patient has had recent dysuria.  Blood pressure 124/79, pulse 74, temperature 97.8 F (36.6 C), temperature source Oral, resp. rate 18, height 6\' 3"  (1.905 m), weight 235 lb (106.595 kg), SpO2 100.00%. The patient appears to be in no distress. He has a very bland affect. Facial features of Parkinson's.  Head and neck exam reveals that the pupils are equal and reactive.  The extraocular movements are full.  There is no scleral icterus.  Mouth and pharynx are benign.  No lymphadenopathy.  No carotid bruits.  The jugular venous pressure is normal.  Thyroid is not enlarged or tender.  Chest is clear to percussion and auscultation. Poor inspiratory effort. No rales or rhonchi.  Expansion of the chest is symmetrical.  Heart reveals no abnormal lift or heave.  First and second heart sounds are normal.  There is no gallop rub or click. Grade 2/6 holosystolic apical murmur.  The abdomen is soft and nontender. Old cholecystectomy scar. Bowel sounds are normoactive.  There is no hepatosplenomegaly or mass.  There are no abdominal bruits.  Extremities reveal no phlebitis. There is mild edema.  Pedal pulses are good.  There is no cyanosis or clubbing.  Neurologic exam is normal  strength and no lateralizing weakness.  No sensory deficits.  Integument reveals no rash  EKG reviewed by me shows no acute ischemic changes.  Assessment/Plan 1. Acute coronary syndrome with crescendo angina. 2. History of paroxysmal atrial fibrillation, presently in NSR. 3. 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 4. Possible UTI.  Hx of BPH 5. Parkinson's disease 6. Depression 7. Hyperlipidemia.  Plan: Admit to monitored bed. Serial enzymes. IV heparin. ASA, No beta blocker because of resting bradycardia. Statins. Plan cath/PCI in am  Cassell Clement 10/04/2011, 6:42 PM

## 2011-10-04 NOTE — ED Notes (Signed)
Pts wife Pleas Koch contact # is [phone]

## 2011-10-05 ENCOUNTER — Inpatient Hospital Stay (HOSPITAL_COMMUNITY): Payer: Medicare Other

## 2011-10-05 ENCOUNTER — Encounter (HOSPITAL_COMMUNITY)
Admission: EM | Disposition: A | Discharge status: Home / Self Care | Payer: Self-pay | Source: Home / Self Care | Attending: Cardiology

## 2011-10-05 DIAGNOSIS — I059 Rheumatic mitral valve disease, unspecified: Secondary | ICD-10-CM

## 2011-10-05 DIAGNOSIS — I2510 Atherosclerotic heart disease of native coronary artery without angina pectoris: Secondary | ICD-10-CM

## 2011-10-05 HISTORY — PX: LEFT HEART CATHETERIZATION WITH CORONARY/GRAFT ANGIOGRAM: SHX5450

## 2011-10-05 LAB — PROTIME-INR: Prothrombin Time: 16.7 seconds — ABNORMAL HIGH (ref 11.6–15.2)

## 2011-10-05 LAB — CARDIAC PANEL(CRET KIN+CKTOT+MB+TROPI)
CK, MB: 2 ng/mL (ref 0.3–4.0)
Relative Index: INVALID (ref 0.0–2.5)
Relative Index: INVALID (ref 0.0–2.5)
Total CK: 49 U/L (ref 7–232)
Total CK: 56 U/L (ref 7–232)
Troponin I: <0.3 ng/mL (ref <0.30)

## 2011-10-05 LAB — BASIC METABOLIC PANEL
CO2: 25 mEq/L (ref 19–32)
Calcium: 8.6 mg/dL (ref 8.4–10.5)
Creatinine, Ser: 1.28 mg/dL (ref 0.50–1.35)
GFR calc non Af Amer: 55 mL/min — ABNORMAL LOW (ref >90)
Sodium: 139 mEq/L (ref 135–145)

## 2011-10-05 LAB — LIPID PANEL
HDL: 50 mg/dL (ref >39)
LDL Cholesterol: 71 mg/dL (ref 0–99)
Total CHOL/HDL Ratio: 2.7 RATIO
Triglycerides: 62 mg/dL (ref <150)
VLDL: 12 mg/dL (ref 0–40)

## 2011-10-05 LAB — URINE CULTURE: Colony Count: 60000

## 2011-10-05 LAB — CBC
HCT: 31.3 % — ABNORMAL LOW (ref 39.0–52.0)
HCT: 33.4 % — ABNORMAL LOW (ref 39.0–52.0)
Hemoglobin: 9.7 g/dL — ABNORMAL LOW (ref 13.0–17.0)
MCH: 24.6 pg — ABNORMAL LOW (ref 26.0–34.0)
MCHC: 31 g/dL (ref 30.0–36.0)
MCHC: 31.1 g/dL (ref 30.0–36.0)
MCV: 79.2 fL (ref 78.0–100.0)
MCV: 80.9 fL (ref 78.0–100.0)
Platelets: 208 10*3/uL (ref 150–400)
RBC: 3.95 MIL/uL — ABNORMAL LOW (ref 4.22–5.81)
RDW: 16.7 % — ABNORMAL HIGH (ref 11.5–15.5)
WBC: 6.7 10*3/uL (ref 4.0–10.5)

## 2011-10-05 LAB — MRSA PCR SCREENING: MRSA by PCR: POSITIVE — AB

## 2011-10-05 LAB — CREATININE, SERUM: GFR calc Af Amer: 68 mL/min — ABNORMAL LOW (ref >90)

## 2011-10-05 LAB — HEPARIN LEVEL (UNFRACTIONATED): Heparin Unfractionated: 0.16 IU/mL — ABNORMAL LOW (ref 0.30–0.70)

## 2011-10-05 SURGERY — LEFT HEART CATHETERIZATION WITH CORONARY/GRAFT ANGIOGRAM
Anesthesia: LOCAL

## 2011-10-05 MED ORDER — LORAZEPAM 2 MG/ML IJ SOLN: 1.0000 mg | Freq: Four times a day (QID) | INTRAMUSCULAR | Status: DC

## 2011-10-05 MED ORDER — CHLORHEXIDINE GLUCONATE CLOTH 2 % EX PADS
6.0000 | MEDICATED_PAD | Freq: Every day | CUTANEOUS | Status: DC
  Administered 2011-10-05 – 2011-10-08 (×4): 6 via TOPICAL

## 2011-10-05 MED ORDER — ONDANSETRON HCL 4 MG/2ML IJ SOLN: 4.0000 mg | Freq: Four times a day (QID) | INTRAMUSCULAR | Status: DC | PRN

## 2011-10-05 MED ORDER — LORAZEPAM 2 MG/ML IJ SOLN
0.5000 mg | Freq: Once | INTRAMUSCULAR | Status: AC
  Administered 2011-10-05: 0.5 mg via INTRAVENOUS

## 2011-10-05 MED ORDER — NITROGLYCERIN 0.2 MG/ML ON CALL CATH LAB
INTRAVENOUS | Status: AC
  Filled 2011-10-05: qty 1

## 2011-10-05 MED ORDER — LORAZEPAM 2 MG/ML IJ SOLN
INTRAMUSCULAR | Status: AC
  Administered 2011-10-05: 02:00:00
  Filled 2011-10-05: qty 1

## 2011-10-05 MED ORDER — FENTANYL CITRATE 0.05 MG/ML IJ SOLN
INTRAMUSCULAR | Status: AC
  Filled 2011-10-05: qty 2

## 2011-10-05 MED ORDER — MIDAZOLAM HCL 2 MG/2ML IJ SOLN
INTRAMUSCULAR | Status: AC
  Filled 2011-10-05: qty 2

## 2011-10-05 MED ORDER — HEPARIN (PORCINE) IN NACL 2-0.9 UNIT/ML-% IJ SOLN
INTRAMUSCULAR | Status: AC
  Filled 2011-10-05: qty 2000

## 2011-10-05 MED ORDER — LIDOCAINE HCL (PF) 1 % IJ SOLN
INTRAMUSCULAR | Status: AC
  Filled 2011-10-05: qty 30

## 2011-10-05 MED ORDER — IOHEXOL 350 MG/ML SOLN
80.0000 mL | Freq: Once | INTRAVENOUS | Status: AC | PRN
  Administered 2011-10-05: 80 mL via INTRAVENOUS

## 2011-10-05 MED ORDER — ACETAMINOPHEN 325 MG PO TABS: 650.0000 mg | ORAL_TABLET | ORAL | Status: DC | PRN

## 2011-10-05 MED ORDER — SODIUM CHLORIDE 0.9 % IV SOLN
INTRAVENOUS | Status: DC
  Administered 2011-10-05: 20:00:00 via INTRAVENOUS

## 2011-10-05 MED ORDER — HEPARIN SODIUM (PORCINE) 5000 UNIT/ML IJ SOLN
5000.0000 [IU] | Freq: Three times a day (TID) | INTRAMUSCULAR | Status: DC
  Administered 2011-10-05 – 2011-10-08 (×7): 5000 [IU] via SUBCUTANEOUS
  Filled 2011-10-05 (×11): qty 1

## 2011-10-05 MED ORDER — LORAZEPAM 2 MG/ML IJ SOLN
1.0000 mg | Freq: Four times a day (QID) | INTRAMUSCULAR | Status: DC | PRN
  Administered 2011-10-05: 1 mg via INTRAVENOUS
  Filled 2011-10-05 (×2): qty 1

## 2011-10-05 MED ORDER — HEPARIN BOLUS VIA INFUSION
2000.0000 [IU] | Freq: Once | INTRAVENOUS | Status: AC
  Administered 2011-10-05: 2000 [IU] via INTRAVENOUS
  Filled 2011-10-05: qty 2000

## 2011-10-05 MED ORDER — MUPIROCIN 2 % EX OINT
1.0000 | TOPICAL_OINTMENT | Freq: Two times a day (BID) | CUTANEOUS | Status: DC
Start: 2011-10-05 — End: 2011-10-08
  Administered 2011-10-05 – 2011-10-08 (×7): 1 via NASAL
  Filled 2011-10-05: qty 22

## 2011-10-05 NOTE — Progress Notes (Signed)
  Echocardiogram 2D Echocardiogram has been performed.  Georgian Co 10/05/2011, 4:48 PM

## 2011-10-05 NOTE — Progress Notes (Signed)
ANTICOAGULATION CONSULT NOTE   Pharmacy Consult for Heparin Indication: chest pain/ACS  Allergies  Allergen Reactions  . Ibuprofen     Stomach hurts    Patient Measurements: Height: 6\' 3"  (190.5 cm) Weight: 235 lb 7.2 oz (106.8 kg) IBW/kg (Calculated) : 84.5  Heparin Dosing Weight: 106 kg  Vital Signs: Temp: 97.4 F (36.3 C) (08/13 0427) Temp src: Oral (08/13 0427) BP: 98/63 mmHg (08/13 0300) Pulse Rate: 78  (08/13 0300)  Labs:  Basename 10/05/11 0439 10/04/11 2249 10/04/11 1637  HGB 9.7* -- 10.3*  HCT 31.3* -- 33.6*  PLT 200 -- 221  APTT -- -- --  LABPROT -- -- --  INR -- -- --  HEPARINUNFRC 0.16* -- --  CREATININE -- -- 1.35  CKTOTAL -- 54 --  CKMB -- 2.5 --  TROPONINI -- <0.30 --    Estimated Creatinine Clearance: 67.3 ml/min (by C-G formula based on Cr of 1.35).  Assessment: 71 yo male with chest pain for Heparin  Goal of Therapy:  Heparin level 0.3-0.7 units/ml Monitor platelets by anticoagulation protocol: Yes   Plan:  Heparin 2000 units IV bolus, then increase heparin 1700 units/hr F/U after cath  Geannie Risen, PharmD, BCPS  10/05/2011, 5:18 AM

## 2011-10-05 NOTE — Progress Notes (Signed)
Nutrition Brief Note  Patient identified on the Nutrition Risk Report for problems chewing or swallowing. Pt reports problems chewing and swallowing r/t parkinson's. Pt mostly eats soft vegetables, does not eat meat or bread. Pt does drink a protein supplements usually mixed with ice cream for kcal and protein needs. RD encouraged pt to order foods he knows he tolerates when diet advanced. Encouraged good protein intake for healing.    Body mass index is 29.43 kg/(m^2). Pt meets criteria for overweight based on current BMI.   Current diet order is NPO for cardiac cath today. Labs and medications reviewed.   No nutrition interventions warranted at this time. If nutrition issues arise, please consult RD.   Clarene Duke RD, LDN Pager [phone] After Hours pager [phone]

## 2011-10-05 NOTE — Consult Note (Addendum)
CARDIOTHORACIC SURGERY CONSULTATION REPORT  PCP is Nelwyn Salisbury, MD Referring Provider is [name], Bevelyn Buckles, MD Primary Cardiologist is Olga Millers, MD   Reason for consultation:  Possible aortic dissection  HPI:  Patient is a 71 year old male with complex medical history including coronary artery disease, mitral regurgitation, paroxysmal atrial fibrillation, long-standing Parkinson's disease, dementia, and depression.  2 years ago the patient presented with chest pain and was found to have ostial stenosis of the left anterior descending coronary artery as well as mitral valve prolapse with severe mitral regurgitation. The patient underwent coronary artery bypass grafting x2 and mitral valve repair by Dr. Donata Clay on 09/01/2009.  Grafts placed at the time of surgery included left internal mammary artery to the distal left anterior descending coronary artery and saphenous vein graft to the obtuse marginal branch. The mitral valve was repaired using complex valvuloplasty with  32 mm Edwards Physio II ring annuloplasty. The left atrial appendage was oversewn at the time of surgery.  [name] postoperative convalescence was somewhat slow due to his underlying Parkinson's disease and to the development of wound infection involving his saphenous vein harvest incision, but the patient ultimately recovered successfully.  The patient has been followed carefully since then by Dr. Jens Som who saw him most recently in the office 08/11/2011. The patient has known history of paroxysmal atrial fibrillation but is felt to be relatively poor candidate for Coumadin therapy due to to his risk of fall.  At some point the patient had decided that he didn't not to wish to keep living and he didn't want to keep taking all of his medications, and subsequently the patient was made a no CODE BLUE. However, since then the patient has gone back to taking some of his medications, and his current wishes  regarding his long-term treatment remain unclear.      The patient reports having intermittent symptoms of severe substernal chest pain associated with shortness of breath off and on for last several months the symptoms apparently have greatly accelerated over the last few weeks. Ultimately he developed severe chest pain on August 7 and contacted Dr. Ludwig Clarks office. The patient was advised to the emergency room but did not. On August 11 the patient again had recurrent chest pain and called 911 but subsequently refused go to the emergency room with the EMS when they arrived. Yesterday morning the patient awoke with resting chest pain and shortness of breath and ultimately agreed to come to the emergency room.  On arrival to the emergency room EKG revealed no acute ischemic changes.  Cardiac enzymes have remained negative since admission.  Pro BNP levels are mildly elevated. Chest x-ray revealed mild cardiomegaly without pulmonary edema.   The patient was brought to the cardiac Cath Lab this afternoon by Dr. Gala Romney for diagnostic cardiac catheterization.  Cardiac catheterization revealed persistent unusual 90% ostial stenosis of left anterior descending coronary artery. There was otherwise no significant coronary artery disease. The saphenous vein graft to the obtuse marginal branch was atretic. The left internal mammary artery was not visualized. On injection of the left subclavian there was suggestion of possible aortic dissection. On injection of left coronary there was an unusual area in contrast laterally there was also felt to be concerning for possible acute aortic dissection. Emergent cardiac surgical consultation was requested in the cath lab. I had the opportunity to evaluate the patient briefly and the cath lab and now returns for further definitive consultation following  CT angiogram performed this evening.  The patient denies any ongoing symptoms of chest pain or shortness of breath. He is  been having intermittent episodes of chest pain associated with shortness of breath at rest off and on for at least a few weeks with what appears to be increasing frequency and severity. Symptoms are suspicious for unstable angina pectoris. Patient denies resting shortness of breath, PND, orthopnea, palpitations, or syncope.  Past Medical History  Diagnosis Date  . Atrial fibrillation     a. Paroxysmal, not Coumadin candidate 2/2 Parkinson's/gait instability  . PE (pulmonary embolism)     a. >20 years ago;  b. nl V:Q scan 10/2010  . DVT (deep venous thrombosis)   . Meningioma   . BPH (benign prostatic hyperplasia)   . Personal history of colonic polyps   . Venereal wart   . Depression   . Asthma   . Chickenpox   . Thrombophlebitis   . GERD (gastroesophageal reflux disease)   . Parkinson disease   . Dementia   . CAD (coronary artery disease)     a. 08/2009 s/p CABG x 2 (LIMA->LAD, SVG-OM)  . Hyperlipidemia   . Stricture and stenosis of esophagus   . Hiatal hernia   . Diverticulosis of colon (without mention of hemorrhage)   . Mitral regurgitation     a. 08/2009 s/p MV repair 22mm Edwards ring and oversewing of LA appendage @ time of CABG;  b. 06/2011 Echo: EF 60-65% Mild MR.    Past Surgical History  Procedure Date  . Inguinal hernia repair   . Tonsilectomy, adenoidectomy, bilateral myringotomy and tubes   . Varicose vein surgery     left leg  . Back surgery     herniated disc & remove bone spurs  . Coronary artery bypass graft   . Mitral valve repair   . Umbilical hernia repair   . External ear surgery     Family History  Problem Relation Age of Onset  . Arthritis    . Diabetes    . Stroke    . Heart disease Mother     Social History History  Substance Use Topics  . Smoking status: Former Games developer  . Smokeless tobacco: Never Used  . Alcohol Use: No    Prior to Admission medications   Medication Sig Start Date End Date Taking? Authorizing Provider  amiodarone  (PACERONE) 200 MG tablet Take 1 tablet (200 mg total) by mouth daily. 05/12/11  Yes Lewayne Bunting, MD  aspirin EC 81 MG tablet Take 81 mg by mouth daily. 04/15/11  Yes [name] G Barrett, PA  carbidopa-levodopa (SINEMET) 25-100 MG per tablet Take 1 tablet by mouth 3 (three) times daily. 1 tablet at breakfast and lunch 03/05/11  Yes Nelwyn Salisbury, MD  clonazePAM (KLONOPIN) 1 MG tablet Take 1 mg by mouth every 6 (six) hours.   Yes Historical Provider, MD  desvenlafaxine (PRISTIQ) 50 MG 24 hr tablet Take 50 mg by mouth at bedtime. 09/20/11 09/19/12 Yes Nelwyn Salisbury, MD  Multiple Vitamin (MULITIVITAMIN WITH MINERALS) TABS Take 1 tablet by mouth daily.   Yes Historical Provider, MD  pantoprazole (PROTONIX) 40 MG tablet Take 40 mg by mouth 2 (two) times daily.   Yes Historical Provider, MD  polyethylene glycol (MIRALAX / GLYCOLAX) packet Take 17 g by mouth daily as needed. For constipation   Yes Historical Provider, MD  pravastatin (PRAVACHOL) 40 MG tablet Take 1 tablet (40 mg total) by mouth every evening. 08/11/11  08/10/12 Yes Lewayne Bunting, MD  promethazine (PHENERGAN) 25 MG tablet Take 25 mg by mouth every 6 (six) hours as needed. For nausea   Yes Historical Provider, MD  Tamsulosin HCl (FLOMAX) 0.4 MG CAPS Take 0.4 mg by mouth daily.   Yes Historical Provider, MD    Current Facility-Administered Medications  Medication Dose Route Frequency Provider Last Rate Last Dose  . 0.9 %  sodium chloride infusion  250 mL Intravenous PRN Ok Anis, NP      . 0.9 %  sodium chloride infusion   Intravenous Continuous Dolores Patty, MD      . acetaminophen (TYLENOL) tablet 650 mg  650 mg Oral Q4H PRN Dolores Patty, MD      . amiodarone (PACERONE) tablet 200 mg  200 mg Oral Daily Ok Anis, NP   200 mg at 10/05/11 0954  . aspirin chewable tablet 324 mg  324 mg Oral Pre-Cath Ok Anis, NP   324 mg at 10/05/11 0540  . aspirin EC tablet 81 mg  81 mg Oral Daily Ok Anis, NP      . carbidopa-levodopa (SINEMET IR) 25-100 MG per tablet immediate release 1 tablet  1 tablet Oral TID Ok Anis, NP   1 tablet at 10/05/11 0953  . Chlorhexidine Gluconate Cloth 2 % PADS 6 each  6 each Topical Q0600 Cassell Clement, MD   6 each at 10/05/11 [phone]  . clonazePAM (KLONOPIN) tablet 1 mg  1 mg Oral Q6H Ok Anis, NP   1 mg at 10/05/11 1219  . fentaNYL (SUBLIMAZE) 0.05 MG/ML injection           . heparin 2-0.9 UNIT/ML-% infusion           . heparin bolus via infusion 2,000 Units  2,000 Units Intravenous Once Cassell Clement, MD   2,000 Units at 10/05/11 0540  . heparin bolus via infusion 4,000 Units  4,000 Units Intravenous Once Cherre Robins, MD   4,000 Units at 10/04/11 1933  . heparin injection 5,000 Units  5,000 Units Subcutaneous Q8H Bevelyn Buckles Bensimhon, MD      . iohexol (OMNIPAQUE) 350 MG/ML injection 80 mL  80 mL Intravenous Once PRN Medication Radiologist, MD   80 mL at 10/05/11 1744  . lidocaine (XYLOCAINE) 1 % injection           . LORazepam (ATIVAN) 2 MG/ML injection           . LORazepam (ATIVAN) injection 0.5 mg  0.5 mg Intravenous Once Ardis Rowan, MD   0.5 mg at 10/05/11 0223  . LORazepam (ATIVAN) injection 1 mg  1 mg Intravenous Once Geoffery Lyons, MD   1 mg at 10/04/11 1930  . LORazepam (ATIVAN) injection 1 mg  1 mg Intravenous Q6H PRN Rollene Rotunda, MD      . midazolam (VERSED) 2 MG/2ML injection           . multivitamin with minerals tablet 1 tablet  1 tablet Oral Daily Ok Anis, NP      . mupirocin ointment (BACTROBAN) 2 % 1 application  1 application Nasal BID Cassell Clement, MD   1 application at 10/05/11 0957  . nitroGLYCERIN (NITROGLYN) 2 % ointment 1 inch  1 inch Topical Q6H Ok Anis, NP   1 inch at 10/05/11 1220  . nitroGLYCERIN (NITROSTAT) SL tablet 0.4 mg  0.4 mg Sublingual Q5 min PRN Geoffery Lyons, MD      . nitroGLYCERIN (  NTG ON-CALL) 0.2 mg/mL injection           . ondansetron (ZOFRAN) injection  4 mg  4 mg Intravenous Q6H PRN Dolores Patty, MD      . pantoprazole (PROTONIX) EC tablet 40 mg  40 mg Oral BID Ok Anis, NP   40 mg at 10/05/11 0032  . polyethylene glycol (MIRALAX / GLYCOLAX) packet 17 g  17 g Oral Daily PRN Ok Anis, NP      . promethazine (PHENERGAN) tablet 25 mg  25 mg Oral Q6H PRN Ok Anis, NP      . simvastatin (ZOCOR) tablet 20 mg  20 mg Oral q1800 Ok Anis, NP      . sodium chloride 0.9 % injection 3 mL  3 mL Intravenous Q12H Ok Anis, NP      . sodium chloride 0.9 % injection 3 mL  3 mL Intravenous PRN Ok Anis, NP      . Tamsulosin HCl (FLOMAX) capsule 0.4 mg  0.4 mg Oral Daily Ok Anis, NP      . venlafaxine XR (EFFEXOR-XR) 24 hr capsule 75 mg  75 mg Oral Q breakfast Ok Anis, NP   75 mg at 10/05/11 0955  . DISCONTD: 0.9 %  sodium chloride infusion  1 mL/kg/hr Intravenous Continuous Ok Anis, NP 106 mL/hr at 10/05/11 0602 0.994 mL/kg/hr at 10/05/11 0602  . DISCONTD: acetaminophen (TYLENOL) tablet 650 mg  650 mg Oral Q4H PRN Ok Anis, NP   650 mg at 10/05/11 1417  . DISCONTD: heparin ADULT infusion 100 units/mL (25000 units/250 mL)  1,700 Units/hr Intravenous Continuous Cassell Clement, MD 17 mL/hr at 10/05/11 0537 1,700 Units/hr at 10/05/11 0537  . DISCONTD: LORazepam (ATIVAN) injection 1 mg  1 mg Intravenous Q6H Rollene Rotunda, MD      . DISCONTD: ondansetron (ZOFRAN) injection 4 mg  4 mg Intravenous Q6H PRN Ok Anis, NP        Allergies  Allergen Reactions  . Ibuprofen     Stomach hurts    Review of Systems:  General:  normal appetite, decreased energy   Respiratory:  no cough, no wheezing, no hemoptysis, no pain with inspiration or cough, + shortness of breath   Cardiac:  + chest pain or tightness, + exertional SOB, no resting SOB, no PND, no orthopnea, no LE edema, no palpitations, no syncope  GI:   no difficulty swallowing, +  hematochezia, no hematemesis, no melena, no constipation, no diarrhea   GU:   no dysuria, no urgency, no frequency   Musculoskeletal: no arthritis, no arthralgia, severely limited mobility due to weakness and tremor  Vascular:  no pain suggestive of claudication   Neuro:   no symptoms suggestive of TIA's, no seizures, no headaches, no peripheral neuropathy, + severe generalized weakness with chronic tremor  Endocrine:  Negative   HEENT:  no loose teeth or painful teeth,  no recent vision changes  Psych:   + anxiety, + depression    Physical Exam:   BP 138/88  Pulse 62  Temp 98 F (36.7 C) (Oral)  Resp 11  Ht 6\' 3"  (1.905 m)  Wt 106.8 kg (235 lb 7.2 oz)  BMI 29.43 kg/m2  SpO2 100%  General:  Elderly and frail-appearing  HEENT:  Unremarkable   Neck:   no JVD, no bruits, no adenopathy   Chest:   clear to auscultation, symmetrical breath sounds, no wheezes, no rhonchi  CV:   RRR, grade III/VI systolic murmur   Abdomen:  soft, non-tender, no masses   Extremities:  warm, well-perfused, pulses palpable  Rectal/GU  Deferred  Neuro:   Grossly non-focal and symmetrical throughout but severe tremor and weakness  Skin:   Clean and dry, no rashes, no breakdown  Diagnostic Tests:  Cardiac Cath Procedure Note:  Indication: Chest pain  Procedures performed:  1) Selective coronary angiography  2) Left heart catheterization  3) Left ventriculogram  4) SVG angio  5) L subclavian angio  Description of procedure:   The risks and indication of the procedure were explained. Consent was signed and placed on the chart. An appropriate timeout was taken prior to the procedure. The right groin was prepped and draped in the routine sterile fashion and anesthetized with 1% local lidocaine.  A 5 FR arterial sheath was placed in the right femoral artery using a modified Seldinger technique. Standard catheters including a JL4, JR4 and angled pigtail were used. All catheter exchanges were made over a  wire.  Complications: None apparent  Findings:  Ao Pressure: 138/84 (109)  Left main: Absent. LAD and LCX have separate ostia.  LAD: There is a 90% cleft-like ostial lesion. The mid to distal LAD is not well opacified but there are no obvious high-grade lesions. I do not see competitive flow from the LIMA.  LCX: Normal  RCA: Large dominant vessel. Tandem 20% lesions in midsection  SVG-OM: Atretic  L subclavian angio: There appears to a flap int he proximal portion. Unable to advance a wire past the area in question. The LIMA is not well opacified. Appears atretic but cannot evaluate definitively.  LV-gram: Not performed  On injecting the L left coronary syndrome there is an area on the lateral aspect of the heart that fills with contrast. Initially I felt this was an aortic dissection but on closer review it appears that the L atrial appendage may be filling. Stat 2-D echo in the cath lab did not show any evidence of a aortic root dissection. 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  Assessment:  1. 1V CAD as describes above with high-grade LAD disease  2. Atretic SVG to OM  3. Possible flap in proximal left subclavian - unable to image LIMA well  4. Area of dye staining lateral to the heart (see description above)  Plan/Discussion:  Case reviewed with Drs. Eden Emms and [employer]. Will proceed with stat chest CT to exclude dissection. If this is negative will need TEE tomorrow to further evaluate. If LIMA is compromised via subclavian may be having ischemia to LAD territory.  [name]  4:30 PM   Echocardiography  Patient: [name], [name] MR #: [id number] Study Date: 10/05/2011 Gender: M Age: 24 Height: Weight: BSA: Pt. Status: Room: 2923  ATTENDING Cassell Clement PERFORMING [place] SONOGRAPHER [place] Co, RDCS, CCT ORDERING [name],  Wayne cc:  ------------------------------------------------------------  ------------------------------------------------------------ Indications: Chest pain 786.51.  ------------------------------------------------------------ History: PMH: Suspected Aortic dissection.  ------------------------------------------------------------ Study Conclusions  - Left ventricle: Limited views. No LVH overall EF appears in normal range - Aorta: Ascneding aorta not well visualized but no obvious disection and only mild dilatation - Mitral valve: S/P repair with annuloplasty ring and mild residual MR Mild regurgitation. - Left atrium: The atrium was mildly dilated. Echocardiography. M-mode, limited 2D, limited spectral Doppler, and color Doppler. Patient status: Inpatient. Location: Catheterization laboratory.  ------------------------------------------------------------  ------------------------------------------------------------ Left ventricle: Limited views. No LVH overall EF appears  in normal range  ------------------------------------------------------------ Aortic valve: Mildly calcified leaflets.  ------------------------------------------------------------ Aorta: Ascneding aorta not well visualized but no obvious disection and only mild dilatation Ascending aorta: The ascending aorta was mildly dilated.  ------------------------------------------------------------ Mitral valve: S/P repair with annuloplasty ring and mild residual MR Doppler: Mild regurgitation.  ------------------------------------------------------------ Left atrium: The atrium was mildly dilated.  ------------------------------------------------------------ Atrial septum: Poorly visualized.  ------------------------------------------------------------ Right ventricle: The cavity size was normal. Wall thickness was normal. Systolic function was  normal.  ------------------------------------------------------------ Pulmonic valve: The valve appears to be grossly normal.  ------------------------------------------------------------ Tricuspid valve: Doppler: Mild regurgitation.  ------------------------------------------------------------ Right atrium: The atrium was normal in size.  ------------------------------------------------------------ Pericardium: The pericardium was normal in appearance.  ------------------------------------------------------------ Post procedure conclusions Ascending Aorta:  - Ascneding aorta not well visualized but no obvious disection and only mild dilatation  ------------------------------------------------------------ Prepared and Electronically Authenticated by  Charlton Haws 2013-08-13T17:07:32.020   CT ANGIOGRAPHY CHEST, ABDOMEN AND PELVIS  Technique: Multidetector CT imaging through the chest, abdomen and  pelvis was performed using the standard protocol during bolus  administration of intravenous contrast. Multiplanar reconstructed  images including MIPs were obtained and reviewed to evaluate the  vascular anatomy.  Contrast: 80mL OMNIPAQUE IOHEXOL 350 MG/ML SOLN  Comparison: None.  CTA CHEST  Findings: The thoracic aorta is normally patent and shows no  evidence of dissection or aneurysmal disease. Proximal great  vessels are tortuous and demonstrate no evidence of dissection,  significant plaque or stenoses. No focal aneurysms are identified.  In particular, the proximal left subclavian artery is tortuous but  shows no focal dissection. The left vertebral artery is well  delineated proximally and normally patent. The proximal left  internal mammary artery is patent and small in caliber. Distal  delineation of LIMA graft patency is not accurately assessed by  current CTA. A saphenous vein graft is also not well delineated by  CTA.  The small right pleural effusion present  with right lower lobe  atelectasis. Trace amount of left pleural fluid present. No overt  pulmonary edema. Scattered areas of parenchymal scarring present  bilaterally. No evidence of enlarged lymph nodes. No pericardial  effusion.  Review of the MIP images confirms the above findings.  IMPRESSION:  There is no evidence by CTA of thoracic aortic dissection or  dissection of the proximal great vessels. All the great vessels  show moderate tortuosity. The proximal aspect of the left internal  mammary artery is demonstrated to be patent but very small in  caliber. Distal LIMA patency is not as well assessed by CTA.  CTA ABDOMEN AND PELVIS  Findings: The abdominal aorta is of normal caliber and shows no  evidence of aneurysmal disease or dissection. The celiac axis,  superior mesenteric artery, bilateral paired renal arteries and the  inferior mesenteric arteries show no evidence of significant  disease. Iliac and visualized femoral arteries show normal  patency.  Nonvascular evaluation shows no solid organ abnormalities. There  is a right inguinal hernia containing fat. Right-sided femoral  arterial sheath present. Degenerative changes are present in the  lower lumbar spine.  Review of the MIP images confirms the above findings.  IMPRESSION:  Normal caliber abdominal aorta and widely patent visceral arteries.  No evidence of aortic dissection or significant arterial occlusive  disease in the abdomen or pelvis.  Original Report Authenticated By: Reola Calkins, M.D.   Impression:  There is no evidence for the presence of aortic dissection. The patient describes symptoms of chest pain with shortness of  breath suspicious for unstable angina pectoris. He has well documented high-grade ostial stenosis of left anterior descending coronary artery and underwent coronary artery bypass grafting x2  in 2011 using left internal mammary artery to the distal left anterior descending coronary  artery and saphenous vein graft to the obtuse marginal branch.  The saphenous vein graft is atretic and nonfunctional. Left internal mammary artery graft was not visualized at catheterization today. On CT angiogram the left internal mammary artery can be visualized filling with contrast but cannot be followed to the surface of the left ventricle. It is possible that this graft is also atretic and nonfunctional, but this probably should be further assessed with repeat catheterization if aggressive treatment is to be contemplated.  The patient has a prominent systolic murmur on physical exam but only mild mitral regurgitation on transthoracic echocardiogram. There is unusual appearance of the annuloplasty ring and mitral inflow on transthoracic echocardiogram. Transesophageal echocardiogram might be helpful. There appears to be some contrast getting into the left atrial appendage with likely thrombus within the left atrial appendage, despite the fact that the patient had oversewing of left atrial appendage the time of the surgery 2 years ago. Finally, it remains very unclear what the patient and his family desires regarding his long-term care. He would be a very poor candidate for repeat cardiac surgery under any circumstances, and at least intermittently he has expressed desires to be made NO CODE BLUE in the past.  When I asked the patient and his family how he feels about this presently and how aggressive he wants to be treated, the patient answers inconsistently and remains fairly confused.   Plan:  We will continue to follow along.  If further aggressive diagnostic testing and treatment are to be contemplated I would favor proceeding with transesophageal echocardiogram as well as repeat catheterization of the left internal mammary artery graft.     Salvatore Decent. Cornelius Moras, MD 10/05/2011 7:28 PM

## 2011-10-05 NOTE — ED Provider Notes (Signed)
I saw and evaluated the patient, reviewed Dr. Daleen Bo note and I agree with the findings and plan.  The patient presents to the ED complaining of chest pain starting yesterday.  He has a significant pmh including cabg 10 years ago.  He states that these symptoms feel like his prior cardiac symptoms.  On exam, the vitals are stable and the patient is afebrile.  The heart and lung exam are unremarkable.  The ekg does not show any acute abnormalities and the labs returned negative for mi.  Due to the nature of the patient's complaints and the significant cardiac history, cardiology will be consulted for admission for what appears to be an accelerating anginal pattern.    Geoffery Lyons, MD 10/05/11 [phone]

## 2011-10-05 NOTE — CV Procedure (Signed)
Cardiac Cath Procedure Note:  Indication: Chest pain  Procedures performed:  1) Selective coronary angiography 2) Left heart catheterization 3) Left ventriculogram 4) SVG angio 5) L subclavian angio  Description of procedure:   The risks and indication of the procedure were explained. Consent was signed and placed on the chart. An appropriate timeout was taken prior to the procedure. The right groin was prepped and draped in the routine sterile fashion and anesthetized with 1% local lidocaine.   A 5 FR arterial sheath was placed in the right femoral artery using a modified Seldinger technique. Standard catheters including a JL4, JR4 and angled pigtail were used. All catheter exchanges were made over a wire.  Complications:  None apparent  Findings:  Ao Pressure: 138/84 (109)  Left main: Absent. LAD and LCX have separate ostia.  LAD: There is a 90% cleft-like ostial lesion. The mid to distal LAD is not well opacified but there are no obvious high-grade lesions. I do not see competitive flow from the LIMA.  LCX: Normal  RCA: Large dominant vessel. Tandem 20% lesions in midsection  SVG-OM: Atretic  L subclavian angio: There appears to a flap int he proximal portion. Unable to advance a wire past the area in question. The LIMA is not well opacified. Appears atretic but cannot evaluate definitively.  LV-gram: Not performed   On injecting the L left coronary syndrome there is an area on the lateral aspect of the heart that fills with contrast. Initially I felt this was an aortic dissection but on closer review it appears that the L atrial appendage may be filling. Stat 2-D echo in the cath lab did not show any evidence of a aortic root dissection. 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   Assessment: 1. 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 Possible flap in proximal left subclavian - unable to  image LIMA well 4. Area of dye staining lateral to the heart (see description above)  Plan/Discussion:  Case reviewed with Drs. Eden Emms and [employer]. Will proceed with stat chest CT to exclude dissection. If this is negative will need TEE tomorrow to further evaluate. If LIMA is compromised via subclavian may be having ischemia to LAD territory.   [name] 4:30 PM

## 2011-10-05 NOTE — Progress Notes (Signed)
 SUBJECTIVE:  He was reported to have episodes of "panic" as described by nursing.  No arrhythmias at that time.  Enzymes negative.     PHYSICAL EXAM Filed Vitals:   10/05/11 0427 10/05/11 0500 10/05/11 0600 10/05/11 0700  BP:   120/72 111/70  Pulse:  72 74 73  Temp: 97.4 F (36.3 C)   97.8 F (36.6 C)  TempSrc: Oral   Oral  Resp:  13 12 9  Height:      Weight:      SpO2:  99% 99% 99%   General:  Anxious.  Mildly confused Lungs:  Clear Heart:  RRR Abdomen:  Positive bowel sounds, no rebound no guarding Extremities:  No edema.  LABS: Lab Results  Component Value Date   CKTOTAL 49 10/05/2011   CKMB 2.0 10/05/2011   TROPONINI <0.30 10/05/2011   Results for orders placed during the hospital encounter of 10/04/11 (from the past 24 hour(s))  CBC     Status: Abnormal   Collection Time   10/04/11  4:37 PM      Component Value Range   WBC 6.9  4.0 - 10.5 K/uL   RBC 4.15 (*) 4.22 - 5.81 MIL/uL   Hemoglobin 10.3 (*) 13.0 - 17.0 g/dL   HCT 33.6 (*) 39.0 - 52.0 %   MCV 81.0  78.0 - 100.0 fL   MCH 24.8 (*) 26.0 - 34.0 pg   MCHC 30.7  30.0 - 36.0 g/dL   RDW 16.9 (*) 11.5 - 15.5 %   Platelets 221  150 - 400 K/uL  BASIC METABOLIC PANEL     Status: Abnormal   Collection Time   10/04/11  4:37 PM      Component Value Range   Sodium 138  135 - 145 mEq/L   Potassium 4.6  3.5 - 5.1 mEq/L   Chloride 105  96 - 112 mEq/L   CO2 24  19 - 32 mEq/L   Glucose, Bld 96  70 - 99 mg/dL   BUN 12  6 - 23 mg/dL   Creatinine, Ser 1.35  0.50 - 1.35 mg/dL   Calcium 8.6  8.4 - 10.5 mg/dL   GFR calc non Af Amer 52 (*) >90 mL/min   GFR calc Af Amer 60 (*) >90 mL/min  PRO B NATRIURETIC PEPTIDE     Status: Abnormal   Collection Time   10/04/11  4:47 PM      Component Value Range   Pro B Natriuretic peptide (BNP) 656.8 (*) 0 - 125 pg/mL  POCT I-STAT TROPONIN I     Status: Normal   Collection Time   10/04/11  5:00 PM      Component Value Range   Troponin i, poc 0.01  0.00 - 0.08 ng/mL   Comment 3            URINALYSIS, ROUTINE W REFLEX MICROSCOPIC     Status: Normal   Collection Time   10/04/11  7:03 PM      Component Value Range   Color, Urine YELLOW  YELLOW   APPearance CLEAR  CLEAR   Specific Gravity, Urine 1.012  1.005 - 1.030   pH 7.0  5.0 - 8.0   Glucose, UA NEGATIVE  NEGATIVE mg/dL   Hgb urine dipstick NEGATIVE  NEGATIVE   Bilirubin Urine NEGATIVE  NEGATIVE   Ketones, ur NEGATIVE  NEGATIVE mg/dL   Protein, ur NEGATIVE  NEGATIVE mg/dL   Urobilinogen, UA 0.2  0.0 - 1.0 mg/dL     Nitrite NEGATIVE  NEGATIVE   Leukocytes, UA NEGATIVE  NEGATIVE  MRSA PCR SCREENING     Status: Abnormal   Collection Time   10/04/11 10:38 PM      Component Value Range   MRSA by PCR POSITIVE (*) NEGATIVE  CARDIAC PANEL(CRET KIN+CKTOT+MB+TROPI)     Status: Normal   Collection Time   10/04/11 10:49 PM      Component Value Range   Total CK 54  7 - 232 U/L   CK, MB 2.5  0.3 - 4.0 ng/mL   Troponin I <0.30  <0.30 ng/mL   Relative Index RELATIVE INDEX IS INVALID  0.0 - 2.5  HEPARIN LEVEL (UNFRACTIONATED)     Status: Abnormal   Collection Time   10/05/11  4:39 AM      Component Value Range   Heparin Unfractionated 0.16 (*) 0.30 - 0.70 IU/mL  CBC     Status: Abnormal   Collection Time   10/05/11  4:39 AM      Component Value Range   WBC 7.0  4.0 - 10.5 K/uL   RBC 3.95 (*) 4.22 - 5.81 MIL/uL   Hemoglobin 9.7 (*) 13.0 - 17.0 g/dL   HCT 31.3 (*) 39.0 - 52.0 %   MCV 79.2  78.0 - 100.0 fL   MCH 24.6 (*) 26.0 - 34.0 pg   MCHC 31.0  30.0 - 36.0 g/dL   RDW 16.5 (*) 11.5 - 15.5 %   Platelets 200  150 - 400 K/uL  BASIC METABOLIC PANEL     Status: Abnormal   Collection Time   10/05/11  4:39 AM      Component Value Range   Sodium 139  135 - 145 mEq/L   Potassium 4.1  3.5 - 5.1 mEq/L   Chloride 106  96 - 112 mEq/L   CO2 25  19 - 32 mEq/L   Glucose, Bld 106 (*) 70 - 99 mg/dL   BUN 11  6 - 23 mg/dL   Creatinine, Ser 1.28  0.50 - 1.35 mg/dL   Calcium 8.6  8.4 - 10.5 mg/dL   GFR calc non Af Amer 55 (*) >90  mL/min   GFR calc Af Amer 64 (*) >90 mL/min  CARDIAC PANEL(CRET KIN+CKTOT+MB+TROPI)     Status: Normal   Collection Time   10/05/11  5:08 AM      Component Value Range   Total CK 49  7 - 232 U/L   CK, MB 2.0  0.3 - 4.0 ng/mL   Troponin I <0.30  <0.30 ng/mL   Relative Index RELATIVE INDEX IS INVALID  0.0 - 2.5  PRO B NATRIURETIC PEPTIDE     Status: Abnormal   Collection Time   10/05/11  5:08 AM      Component Value Range   Pro B Natriuretic peptide (BNP) 534.4 (*) 0 - 125 pg/mL  LIPID PANEL     Status: Normal   Collection Time   10/05/11  5:08 AM      Component Value Range   Cholesterol 133  0 - 200 mg/dL   Triglycerides 62  <150 mg/dL   HDL 50  >39 mg/dL   Total CHOL/HDL Ratio 2.7     VLDL 12  0 - 40 mg/dL   LDL Cholesterol 71  0 - 99 mg/dL  PROTIME-INR     Status: Abnormal   Collection Time   10/05/11  5:55 AM      Component Value Range   Prothrombin Time 16.7 (*)   11.6 - 15.2 seconds   INR 1.33  0.00 - 1.49    Intake/Output Summary (Last 24 hours) at 10/05/11 0841 Last data filed at 10/05/11 0700  Gross per 24 hour  Intake 298.49 ml  Output    950 ml  Net -651.51 ml    EKG:  NSR, rate 72, PVCs.  No acute ST T wave changes.  10/05/2011   ASSESSMENT AND PLAN:  1. Acute coronary syndrome with crescendo angina.   For cardiac cath today.   2. History of paroxysmal atrial fibrillation:  Maintaining NSR on amiodarone.  3. 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   4. Hyperlipidemia.  LDL 65.  Continue current therapy.  5. Anemia.  This seems to be long standing.  There is no evidence of active bleeding.  I will guaiac stools x 2.    [name] 10/05/2011 8:41 AM   

## 2011-10-05 NOTE — H&P (View-Only) (Signed)
SUBJECTIVE:  He was reported to have episodes of "panic" as described by nursing.  No arrhythmias at that time.  Enzymes negative.     PHYSICAL EXAM Filed Vitals:   10/05/11 0427 10/05/11 0500 10/05/11 0600 10/05/11 0700  BP:   120/72 111/70  Pulse:  72 74 73  Temp: 97.4 F (36.3 C)   97.8 F (36.6 C)  TempSrc: Oral   Oral  Resp:  13 12 9   Height:      Weight:      SpO2:  99% 99% 99%   General:  Anxious.  Mildly confused Lungs:  Clear Heart:  RRR Abdomen:  Positive bowel sounds, no rebound no guarding Extremities:  No edema.  LABS: Lab Results  Component Value Date   CKTOTAL 49 10/05/2011   CKMB 2.0 10/05/2011   TROPONINI <0.30 10/05/2011   Results for orders placed during the hospital encounter of 10/04/11 (from the past 24 hour(s))  CBC     Status: Abnormal   Collection Time   10/04/11  4:37 PM      Component Value Range   WBC 6.9  4.0 - 10.5 K/uL   RBC 4.15 (*) 4.22 - 5.81 MIL/uL   Hemoglobin 10.3 (*) 13.0 - 17.0 g/dL   HCT 16.1 (*) 09.6 - 04.5 %   MCV 81.0  78.0 - 100.0 fL   MCH 24.8 (*) 26.0 - 34.0 pg   MCHC 30.7  30.0 - 36.0 g/dL   RDW 40.9 (*) 81.1 - 91.4 %   Platelets 221  150 - 400 K/uL  BASIC METABOLIC PANEL     Status: Abnormal   Collection Time   10/04/11  4:37 PM      Component Value Range   Sodium 138  135 - 145 mEq/L   Potassium 4.6  3.5 - 5.1 mEq/L   Chloride 105  96 - 112 mEq/L   CO2 24  19 - 32 mEq/L   Glucose, Bld 96  70 - 99 mg/dL   BUN 12  6 - 23 mg/dL   Creatinine, Ser 7.82  0.50 - 1.35 mg/dL   Calcium 8.6  8.4 - 95.6 mg/dL   GFR calc non Af Amer 52 (*) >90 mL/min   GFR calc Af Amer 60 (*) >90 mL/min  PRO B NATRIURETIC PEPTIDE     Status: Abnormal   Collection Time   10/04/11  4:47 PM      Component Value Range   Pro B Natriuretic peptide (BNP) 656.8 (*) 0 - 125 pg/mL  POCT I-STAT TROPONIN I     Status: Normal   Collection Time   10/04/11  5:00 PM      Component Value Range   Troponin i, poc 0.01  0.00 - 0.08 ng/mL   Comment 3            URINALYSIS, ROUTINE W REFLEX MICROSCOPIC     Status: Normal   Collection Time   10/04/11  7:03 PM      Component Value Range   Color, Urine YELLOW  YELLOW   APPearance CLEAR  CLEAR   Specific Gravity, Urine 1.012  1.005 - 1.030   pH 7.0  5.0 - 8.0   Glucose, UA NEGATIVE  NEGATIVE mg/dL   Hgb urine dipstick NEGATIVE  NEGATIVE   Bilirubin Urine NEGATIVE  NEGATIVE   Ketones, ur NEGATIVE  NEGATIVE mg/dL   Protein, ur NEGATIVE  NEGATIVE mg/dL   Urobilinogen, UA 0.2  0.0 - 1.0 mg/dL  Nitrite NEGATIVE  NEGATIVE   Leukocytes, UA NEGATIVE  NEGATIVE  MRSA PCR SCREENING     Status: Abnormal   Collection Time   10/04/11 10:38 PM      Component Value Range   MRSA by PCR POSITIVE (*) NEGATIVE  CARDIAC PANEL(CRET KIN+CKTOT+MB+TROPI)     Status: Normal   Collection Time   10/04/11 10:49 PM      Component Value Range   Total CK 54  7 - 232 U/L   CK, MB 2.5  0.3 - 4.0 ng/mL   Troponin I <0.30  <0.30 ng/mL   Relative Index RELATIVE INDEX IS INVALID  0.0 - 2.5  HEPARIN LEVEL (UNFRACTIONATED)     Status: Abnormal   Collection Time   10/05/11  4:39 AM      Component Value Range   Heparin Unfractionated 0.16 (*) 0.30 - 0.70 IU/mL  CBC     Status: Abnormal   Collection Time   10/05/11  4:39 AM      Component Value Range   WBC 7.0  4.0 - 10.5 K/uL   RBC 3.95 (*) 4.22 - 5.81 MIL/uL   Hemoglobin 9.7 (*) 13.0 - 17.0 g/dL   HCT 46.9 (*) 62.9 - 52.8 %   MCV 79.2  78.0 - 100.0 fL   MCH 24.6 (*) 26.0 - 34.0 pg   MCHC 31.0  30.0 - 36.0 g/dL   RDW 41.3 (*) 24.4 - 01.0 %   Platelets 200  150 - 400 K/uL  BASIC METABOLIC PANEL     Status: Abnormal   Collection Time   10/05/11  4:39 AM      Component Value Range   Sodium 139  135 - 145 mEq/L   Potassium 4.1  3.5 - 5.1 mEq/L   Chloride 106  96 - 112 mEq/L   CO2 25  19 - 32 mEq/L   Glucose, Bld 106 (*) 70 - 99 mg/dL   BUN 11  6 - 23 mg/dL   Creatinine, Ser 2.72  0.50 - 1.35 mg/dL   Calcium 8.6  8.4 - 53.6 mg/dL   GFR calc non Af Amer 55 (*) >90  mL/min   GFR calc Af Amer 64 (*) >90 mL/min  CARDIAC PANEL(CRET KIN+CKTOT+MB+TROPI)     Status: Normal   Collection Time   10/05/11  5:08 AM      Component Value Range   Total CK 49  7 - 232 U/L   CK, MB 2.0  0.3 - 4.0 ng/mL   Troponin I <0.30  <0.30 ng/mL   Relative Index RELATIVE INDEX IS INVALID  0.0 - 2.5  PRO B NATRIURETIC PEPTIDE     Status: Abnormal   Collection Time   10/05/11  5:08 AM      Component Value Range   Pro B Natriuretic peptide (BNP) 534.4 (*) 0 - 125 pg/mL  LIPID PANEL     Status: Normal   Collection Time   10/05/11  5:08 AM      Component Value Range   Cholesterol 133  0 - 200 mg/dL   Triglycerides 62  <644 mg/dL   HDL 50  >03 mg/dL   Total CHOL/HDL Ratio 2.7     VLDL 12  0 - 40 mg/dL   LDL Cholesterol 71  0 - 99 mg/dL  PROTIME-INR     Status: Abnormal   Collection Time   10/05/11  5:55 AM      Component Value Range   Prothrombin Time 16.7 (*)  11.6 - 15.2 seconds   INR 1.33  0.00 - 1.49    Intake/Output Summary (Last 24 hours) at 10/05/11 0841 Last data filed at 10/05/11 0700  Gross per 24 hour  Intake 298.49 ml  Output    950 ml  Net -651.51 ml    EKG:  NSR, rate 72, PVCs.  No acute ST T wave changes.  10/05/2011   ASSESSMENT AND PLAN:  1. Acute coronary syndrome with crescendo angina.   For cardiac cath today.   2. History of paroxysmal atrial fibrillation:  Maintaining NSR on amiodarone.  3. 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   4. Hyperlipidemia.  LDL 65.  Continue current therapy.  5. Anemia.  This seems to be long standing.  There is no evidence of active bleeding.  I will guaiac stools x 2.    Fayrene Fearing [facility] 10/05/2011 8:41 AM

## 2011-10-05 NOTE — Interval H&P Note (Signed)
History and Physical Interval Note:  10/05/2011 3:09 PM  [name]  has presented today for surgery, with the diagnosis of cp  The various methods of treatment have been discussed with the patient and family. After consideration of risks, benefits and other options for treatment, the patient has consented to  Procedure(s) (LRB): LEFT HEART CATHETERIZATION WITH CORONARY/GRAFT ANGIOGRAM (N/A) as a surgical intervention .  [name] history has been reviewed, patient examined, no change in status, stable for surgery.  I have reviewed the patient's chart and labs.  Questions were answered to the patient's satisfaction.     Daniel Bensimhon

## 2011-10-05 NOTE — Progress Notes (Signed)
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 However, it is oversewn and anticoagulation not needed. Bjorn Loser Barrett 10/05/2011

## 2011-10-06 ENCOUNTER — Encounter (HOSPITAL_COMMUNITY): Payer: Self-pay | Admitting: *Deleted

## 2011-10-06 ENCOUNTER — Encounter (HOSPITAL_COMMUNITY)
Admission: EM | Disposition: A | Discharge status: Home / Self Care | Payer: Self-pay | Source: Home / Self Care | Attending: Cardiology

## 2011-10-06 DIAGNOSIS — I200 Unstable angina: Secondary | ICD-10-CM

## 2011-10-06 HISTORY — PX: TEE WITHOUT CARDIOVERSION: SHX5443

## 2011-10-06 SURGERY — ECHOCARDIOGRAM, TRANSESOPHAGEAL
Anesthesia: Moderate Sedation

## 2011-10-06 MED ORDER — DIPHENHYDRAMINE HCL 50 MG/ML IJ SOLN
INTRAMUSCULAR | Status: AC
  Filled 2011-10-06: qty 1

## 2011-10-06 MED ORDER — FENTANYL CITRATE 0.05 MG/ML IJ SOLN
INTRAMUSCULAR | Status: AC
  Filled 2011-10-06: qty 4

## 2011-10-06 MED ORDER — FENTANYL CITRATE 0.05 MG/ML IJ SOLN
INTRAMUSCULAR | Status: DC | PRN
  Administered 2011-10-06 (×2): 25 ug via INTRAVENOUS

## 2011-10-06 MED ORDER — MIDAZOLAM HCL 5 MG/ML IJ SOLN
INTRAMUSCULAR | Status: AC
  Filled 2011-10-06: qty 2

## 2011-10-06 MED ORDER — MIDAZOLAM HCL 10 MG/2ML IJ SOLN
INTRAMUSCULAR | Status: DC | PRN
  Administered 2011-10-06: 2 mg via INTRAVENOUS
  Administered 2011-10-06: 1 mg via INTRAVENOUS
  Administered 2011-10-06: 2 mg via INTRAVENOUS

## 2011-10-06 MED ORDER — BUTAMBEN-TETRACAINE-BENZOCAINE 2-2-14 % EX AERO
INHALATION_SPRAY | CUTANEOUS | Status: DC | PRN
  Administered 2011-10-06: 2 via TOPICAL

## 2011-10-06 MED ORDER — SODIUM CHLORIDE 0.9 % IV SOLN: INTRAVENOUS | Status: DC

## 2011-10-06 NOTE — Progress Notes (Signed)
SUBJECTIVE:  No chest pain or "panic" reported last night.     PHYSICAL EXAM Filed Vitals:   10/05/11 2358 10/06/11 0300 10/06/11 0328 10/06/11 0500  BP:  108/62    Pulse:      Temp: 97.8 F (36.6 C)  99 F (37.2 C)   TempSrc: Oral  Oral   Resp:      Height:      Weight:    234 lb 5.6 oz (106.3 kg)  SpO2:       General:  No distress Lungs:  Clear Heart:  RRR Abdomen:  Positive bowel sounds, no rebound no guarding Extremities:  No edema, right groin without bruising or bleeding.    LABS: Lab Results  Component Value Date   CKTOTAL 56 10/05/2011   CKMB 2.0 10/05/2011   TROPONINI <0.30 10/05/2011   Results for orders placed during the hospital encounter of 10/04/11 (from the past 24 hour(s))  CARDIAC PANEL(CRET KIN+CKTOT+MB+TROPI)     Status: Normal   Collection Time   10/05/11 10:55 AM      Component Value Range   Total CK 56  7 - 232 U/L   CK, MB 2.0  0.3 - 4.0 ng/mL   Troponin I <0.30  <0.30 ng/mL   Relative Index RELATIVE INDEX IS INVALID  0.0 - 2.5  POCT ACTIVATED CLOTTING TIME     Status: Normal   Collection Time   10/05/11  3:51 PM      Component Value Range   Activated Clotting Time 109    CBC     Status: Abnormal   Collection Time   10/05/11  7:30 PM      Component Value Range   WBC 6.7  4.0 - 10.5 K/uL   RBC 4.13 (*) 4.22 - 5.81 MIL/uL   Hemoglobin 10.4 (*) 13.0 - 17.0 g/dL   HCT 16.1 (*) 09.6 - 04.5 %   MCV 80.9  78.0 - 100.0 fL   MCH 25.2 (*) 26.0 - 34.0 pg   MCHC 31.1  30.0 - 36.0 g/dL   RDW 40.9 (*) 81.1 - 91.4 %   Platelets 208  150 - 400 K/uL  CREATININE, SERUM     Status: Abnormal   Collection Time   10/05/11  7:30 PM      Component Value Range   Creatinine, Ser 1.21  0.50 - 1.35 mg/dL   GFR calc non Af Amer 59 (*) >90 mL/min   GFR calc Af Amer 68 (*) >90 mL/min    Intake/Output Summary (Last 24 hours) at 10/06/11 0649 Last data filed at 10/06/11 0200  Gross per 24 hour  Intake 1091.9 ml  Output   1450 ml  Net -358.1 ml    EKG:   NSR, rate 72, PVCs.  No acute ST T wave changes.  10/06/2011   ASSESSMENT AND PLAN:  1. Acute coronary syndrome with crescendo angina.   Cath yesterday with revealed a filling defect compromising the filling of the LIMA.  There is an ostial LAD stenosis as well as contrast filling an area near the LA upon injection of the LM.  CT was negative for any evidence of dissection.  Plan for TEE today.     2. History of paroxysmal atrial fibrillation:  Maintaining NSR on amiodarone.  He has not been on warfarin with fall risk thought we might need to reconsider this if there is LA thrombus as the CT suggests.  3. S/P CABG x2 and Mitral valve repair July  2011.   4. Hyperlipidemia.  LDL 65.  Continue current therapy.  5. Anemia.  This is long standing.  There is no evidence of active bleeding.  Stoll guaiac stools x 2 ordered.  Hbg stable today.    Fayrene Fearing [facility] 10/06/2011 6:49 AM

## 2011-10-06 NOTE — Progress Notes (Signed)
Foot pumps are unavailable at this time due to demand. Order obtained for SCD's instead.

## 2011-10-06 NOTE — H&P (View-Only) (Signed)
 SUBJECTIVE:  No chest pain or "panic" reported last night.     PHYSICAL EXAM Filed Vitals:   10/05/11 2358 10/06/11 0300 10/06/11 0328 10/06/11 0500  BP:  108/62    Pulse:      Temp: 97.8 F (36.6 C)  99 F (37.2 C)   TempSrc: Oral  Oral   Resp:      Height:      Weight:    234 lb 5.6 oz (106.3 kg)  SpO2:       General:  No distress Lungs:  Clear Heart:  RRR Abdomen:  Positive bowel sounds, no rebound no guarding Extremities:  No edema, right groin without bruising or bleeding.    LABS: Lab Results  Component Value Date   CKTOTAL 56 10/05/2011   CKMB 2.0 10/05/2011   TROPONINI <0.30 10/05/2011   Results for orders placed during the hospital encounter of 10/04/11 (from the past 24 hour(s))  CARDIAC PANEL(CRET KIN+CKTOT+MB+TROPI)     Status: Normal   Collection Time   10/05/11 10:55 AM      Component Value Range   Total CK 56  7 - 232 U/L   CK, MB 2.0  0.3 - 4.0 ng/mL   Troponin I <0.30  <0.30 ng/mL   Relative Index RELATIVE INDEX IS INVALID  0.0 - 2.5  POCT ACTIVATED CLOTTING TIME     Status: Normal   Collection Time   10/05/11  3:51 PM      Component Value Range   Activated Clotting Time 109    CBC     Status: Abnormal   Collection Time   10/05/11  7:30 PM      Component Value Range   WBC 6.7  4.0 - 10.5 K/uL   RBC 4.13 (*) 4.22 - 5.81 MIL/uL   Hemoglobin 10.4 (*) 13.0 - 17.0 g/dL   HCT 33.4 (*) 39.0 - 52.0 %   MCV 80.9  78.0 - 100.0 fL   MCH 25.2 (*) 26.0 - 34.0 pg   MCHC 31.1  30.0 - 36.0 g/dL   RDW 16.7 (*) 11.5 - 15.5 %   Platelets 208  150 - 400 K/uL  CREATININE, SERUM     Status: Abnormal   Collection Time   10/05/11  7:30 PM      Component Value Range   Creatinine, Ser 1.21  0.50 - 1.35 mg/dL   GFR calc non Af Amer 59 (*) >90 mL/min   GFR calc Af Amer 68 (*) >90 mL/min    Intake/Output Summary (Last 24 hours) at 10/06/11 0649 Last data filed at 10/06/11 0200  Gross per 24 hour  Intake 1091.9 ml  Output   1450 ml  Net -358.1 ml    EKG:   NSR, rate 72, PVCs.  No acute ST T wave changes.  10/06/2011   ASSESSMENT AND PLAN:  1. Acute coronary syndrome with crescendo angina.   Cath yesterday with revealed a filling defect compromising the filling of the LIMA.  There is an ostial LAD stenosis as well as contrast filling an area near the LA upon injection of the LM.  CT was negative for any evidence of dissection.  Plan for TEE today.     2. History of paroxysmal atrial fibrillation:  Maintaining NSR on amiodarone.  He has not been on warfarin with fall risk thought we might need to reconsider this if there is LA thrombus as the CT suggests.  3. S/P CABG x2 and Mitral valve repair July   2011.   4. Hyperlipidemia.  LDL 65.  Continue current therapy.  5. Anemia.  This is long standing.  There is no evidence of active bleeding.  Stoll guaiac stools x 2 ordered.  Hbg stable today.    [name] 10/06/2011 6:49 AM   

## 2011-10-06 NOTE — CV Procedure (Signed)
    Transesophageal Echocardiogram Note  MOROCCO GIPE [id number] 09-11-1940  Procedure: Transesophageal Echocardiogram Indications: periatrial mass identified at cardiac cath  Procedure Details Consent: Obtained Time Out: Verified patient identification, verified procedure, site/side was marked, verified correct patient position, special equipment/implants available, Radiology Safety Procedures followed,  medications/allergies/relevent history reviewed, required imaging and test results available.  Performed  Medications: Fentanyl: 50 mcg IV Versed: 5 mg  Left Ventrical:  Normal LV function  Mitral Valve: s/p MV repair.  Mild-moderate MR   Aortic Valve: normal  Tricuspid Valve: normal  Pulmonic Valve: not well visualized  Left Atrium/ Left atrial appendage: not seen - likely obliterated at the time of MV surgery.  Atrial septum: no ASD by color doppler   Aorta: the ascending aorta is mildly dilated (3.9 cm). The arch and descending aorta appear normal.  No evidence of dissection.  The is a mass adjacent to the MV and left atrium.  It appears to be filled with thrombus.  There is no significant flow by color doppler.  I suspect this is a hematoma from his MV repair 2 years ago.  It is adjacent to the MV repair and may have originated from localized trauma.  There is no significant flow within the hematoma.  I have reviewed the cardiac cath.  There is a "blush" of contrast with injection of the left main.  I suspected that he has developed neovascularization of this hematoma but there no continuity between the hematoma and LA or aorta.   Complications: No apparent complications Patient did tolerate procedure well.   Vesta Mixer, Montez Hageman., MD, [facility] 10/06/2011, 2:44 PM

## 2011-10-06 NOTE — Progress Notes (Signed)
  Echocardiogram Echocardiogram Transesophageal has been performed.  [name] [name] 10/06/2011, 2:48 PM

## 2011-10-06 NOTE — Interval H&P Note (Signed)
History and Physical Interval Note:  10/06/2011 2:24 PM  [name]  has presented today for surgery, with the diagnosis of look at valve and arch  The various methods of treatment have been discussed with the patient and family. After consideration of risks, benefits and other options for treatment, the patient has consented to  Procedure(s) (LRB): TRANSESOPHAGEAL ECHOCARDIOGRAM (TEE) (N/A) as a surgical intervention .  [name] history has been reviewed, patient examined, no change in status, stable for surgery.  I have reviewed the patient's chart and labs.  Questions were answered to the patient's satisfaction.     Elyn Aquas.

## 2011-10-06 NOTE — Plan of Care (Signed)
Problem: Phase II Progression Outcomes Goal: Other Phase II Outcomes/Goals Outcome: Completed/Met Date Met:  10/06/11 Tee/education done

## 2011-10-07 ENCOUNTER — Encounter (HOSPITAL_COMMUNITY): Payer: Self-pay | Admitting: Cardiovascular Disease

## 2011-10-07 ENCOUNTER — Inpatient Hospital Stay (HOSPITAL_COMMUNITY): Payer: Medicare Other

## 2011-10-07 DIAGNOSIS — I2510 Atherosclerotic heart disease of native coronary artery without angina pectoris: Secondary | ICD-10-CM

## 2011-10-07 DIAGNOSIS — I517 Cardiomegaly: Secondary | ICD-10-CM

## 2011-10-07 LAB — BASIC METABOLIC PANEL
Chloride: 105 mEq/L (ref 96–112)
GFR calc Af Amer: 63 mL/min — ABNORMAL LOW (ref >90)
GFR calc non Af Amer: 55 mL/min — ABNORMAL LOW (ref >90)
Potassium: 3.7 mEq/L (ref 3.5–5.1)
Sodium: 140 mEq/L (ref 135–145)

## 2011-10-07 MED ORDER — GADOBENATE DIMEGLUMINE 529 MG/ML IV SOLN
35.0000 mL | Freq: Once | INTRAVENOUS | Status: AC
  Administered 2011-10-07: 35 mL via INTRAVENOUS

## 2011-10-07 MED ORDER — HYDROCORTISONE 1 % EX CREA
1.0000 "application " | TOPICAL_CREAM | Freq: Three times a day (TID) | CUTANEOUS | Status: DC | PRN
  Administered 2011-10-07: 1 via TOPICAL
  Filled 2011-10-07: qty 28

## 2011-10-07 NOTE — Progress Notes (Signed)
May come off of telemetry to go for mri.  Per dr. Antoine Poche.

## 2011-10-07 NOTE — Progress Notes (Signed)
Patient ID: [name], male   DOB: Jun 14, 1940, 71 y.o.   MRN: [id number] Asked by Dr Antoine Poche to do MRI on patient. Reviewed Cath, TTE, TEE and CT.   Was in room with Dr Teressa Lower during case.    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  It remains surgically isolated as evidenced By no contrast in LA during cath  and no flow in LA by TEE.  It is enlarged, filled with thrombos and  Enhances on CT due to fitula flow.  Fistula flow seen clearly from small vessels coming  Off circumflex during cath.  I had Dr Michaele Offer ammend CT report as the LAA was not  Commented on during initial reading.    Theoretically fistulous flow from native circumflex to LAA could cause angina since OM  Graft is atretic.  Fistula can be congenital or from previous EPS ablative procedure but patient  Has had neither .  OperativeNote indicates LAA oversewn and no Cox/Maze done.  Suture damage From annuloplasty ring and mitral repair less likely  Will proceed with cardiac MRI but suspect the more important issue will be proving the LIMA Is patent to the LAD.  Since LAA is excluded still there should be no thrombo-embolic risk from  Large clot burden in the appendage  [employer]

## 2011-10-07 NOTE — Progress Notes (Signed)
TCTS BRIEF PROGRESS NOTE   I have reviewed Mr Milam' TEE and completely agree with Dr Fabio Bering assessment.  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  This was also clearly demonstrated on the patient's CTA 2 days ago.  There is no reason to perform an MRI.  There is a small leak around the mitral annuloplasty ring as well as a mild central jet of MR, both of which are likely subclinical and unrelated to the patient's presenting chest pain.  There does not appear to be any thrombus protruding into the left atrium.  The primary question is whether or not to repeat catheterization of the LIMA graft.  If this graft is truly non-functional then would he be considered candidate for PCI of LAD?  I would be reluctant to consider redo CABG.  Please call if we can be of further assistance.  OWEN,CLARENCE H 10/07/2011 2:03 PM

## 2011-10-07 NOTE — Progress Notes (Signed)
   SUBJECTIVE:  Denies chest pain or SOB.   PHYSICAL EXAM Filed Vitals:   10/07/11 0700 10/07/11 0800 10/07/11 0833 10/07/11 0835  BP:    96/54  Pulse:   82 81  Temp:    98.4 F (36.9 C)  TempSrc:   Oral Oral  Resp: 14 16  20   Height:      Weight:      SpO2:    95%   General:  No distress HEENT:  PERRL Lungs:  Clear Heart:  RRR, systolic murmur Abdomen:  Positive bowel sounds, no rebound no guarding Extremities:  No edema, right groin without bruising or bleeding.   NEURO:  Nonfocal.  LABS: Lab Results  Component Value Date   CKTOTAL 56 10/05/2011   CKMB 2.0 10/05/2011   TROPONINI <0.30 10/05/2011   Results for orders placed during the hospital encounter of 10/04/11 (from the past 24 hour(s))  BASIC METABOLIC PANEL     Status: Abnormal   Collection Time   10/07/11  6:30 AM      Component Value Range   Sodium 140  135 - 145 mEq/L   Potassium 3.7  3.5 - 5.1 mEq/L   Chloride 105  96 - 112 mEq/L   CO2 23  19 - 32 mEq/L   Glucose, Bld 92  70 - 99 mg/dL   BUN 9  6 - 23 mg/dL   Creatinine, Ser 1.61  0.50 - 1.35 mg/dL   Calcium 8.8  8.4 - 09.6 mg/dL   GFR calc non Af Amer 55 (*) >90 mL/min   GFR calc Af Amer 63 (*) >90 mL/min    Intake/Output Summary (Last 24 hours) at 10/07/11 1027 Last data filed at 10/07/11 0900  Gross per 24 hour  Intake    600 ml  Output    900 ml  Net   -300 ml    ASSESSMENT AND PLAN:  1. Acute coronary syndrome with crescendo angina.   Cath two days ago revealed a filling defect compromising the filling of the LIMA.  There is an ostial LAD stenosis as well as contrast filling an area near the LA upon injection of the LM.  CT was negative for any evidence of dissection.  TEE demonstrated a thrombotic area external to the LA.  There does appear to be flow via small vessels from the LM into this area.  The plan today is an MRI.  I discussed this with the patient and family at length.       2. History of paroxysmal atrial fibrillation:  Maintaining  NSR on amiodarone.  He has not been on warfarin with fall risk.  Of note there is no evidence of LA thrombus.  3. 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   4. Hyperlipidemia.  LDL 65.  Continue current therapy.  5. Anemia.  This is long standing.  There is no evidence of active bleeding.  Stoll guaiac stools x 2 ordered.  Follow CBC in the am.    Rollene Rotunda 10/07/2011 10:27 AM

## 2011-10-08 ENCOUNTER — Encounter (HOSPITAL_COMMUNITY): Payer: Self-pay | Admitting: Physician Assistant

## 2011-10-08 DIAGNOSIS — I513 Intracardiac thrombosis, not elsewhere classified: Secondary | ICD-10-CM

## 2011-10-08 LAB — CBC
HCT: 31.2 % — ABNORMAL LOW (ref 39.0–52.0)
Hemoglobin: 9.7 g/dL — ABNORMAL LOW (ref 13.0–17.0)
WBC: 6 10*3/uL (ref 4.0–10.5)

## 2011-10-08 MED ORDER — NITROGLYCERIN 0.4 MG SL SUBL: 0.4000 mg | SUBLINGUAL_TABLET | SUBLINGUAL | Status: DC | PRN

## 2011-10-08 NOTE — Discharge Summary (Signed)
Discharge Summary   Patient ID: [name] MRN: [id number], DOB/AGE: 06-23-1940 71 y.o. Admit date: 10/04/2011 D/C date:     10/08/2011  Primary Cardiologist: Jens Som  Primary Discharge Diagnoses:  1. 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 H/o mitral regurgitation  - cardiac MRI this admission showing some disruption of lateral annuloplasty ring with "perivalvular" MR and some central MR, felt subclinical - 08/2009 s/p MV repair 22mm Edwards ring and oversewing of LA appendage at time of CABG 3. 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 PAF, not Coumadin candidate 2/2 Parkinson's/gait instability 5. HL 6. Chronic anemia 7. Parkison's disease  Secondary Discharge Diagnoses:  1. Hx of PE >20 years ago (nl VQ 10/2010) 2. Dementia 3. H/o DVT 4. Meningioma 5. BPH 6. Veneral wart 7. Depression 8. Asthma 9. Chickenpox 10. Thrombophlebitis  11. GERD (gastroesophageal reflux disease)  12.Personal history of colonic polyps  13. Diverticulosis of colon (without mention of hemorrhage) 14. Stricture and stenosis of esophagus  15. Hiatal hernia   Hospital Course: [name] is a 71 y/o M with hx of CAD s/p CABG, MV repair, PAF, not on Coumadin due to fall risk from Parkinson's disease. He presented to [facility] with complaints of chest pain. On 09/29/11 patient called office complaining of chest pain unrelieved by NTG and was advised to go to ER but did not. On 10/03/11 he again had chest pain and call 911 but refused to go with EMS to the ER. On day of admission 10/04/11, he awoke with chest discomfort that waxed and waned, substernally,  associated with SOB so presented to the ER. Initial troponin was negative at POC. EKG demonstrated no acute changes. He was admitted to the hospital with concern for crescendo angina. He was placed on IV heparin and ASA, statin. He was not placed on beta blocker because of resting bradycardia. He reported episodes of "panic" as described by nursing while in the hospital with no associated arrhythmia at that time. He was noted to be anemic but this was longstanding without evidence of overt bleeding while in the hospital. He ruled out for MI. Cardiac cath was performed 10/05/11 demonstrating: 1. 1V CAD with high-grade LAD disease  2. Atretic SVG to OM  3. Possible flap in proximal left subclavian - unable to image LIMA well  4. Area of dye staining lateral to the heart Dr. Gala Romney reviewed the caths with Dr. Eden Emms and Dr. Cornelius Moras. A stat CT was obtained which excluded thoracic aortic dissection or dissection of the proximal great vessels. The proximal aspect of the left internal mammary artery was demonstrated to be patent but very small in caliber. Imaging of the abdomen showed normal caliber abdominal aorta, no evidence of aortic dissection. However, there appeared to be a filling defect within the left atrial appendage with avid enhancement around the periphery of the left atrial appendage. 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 Although there did appear to be a large filling defect (  presumably thrombus) within the left atrial appendage, given the prior history of atrial appendage occlusion, the clinical relevance of this thrombus was doubtful per radiology's report. These findings were discussed with Dr. Eden Emms. 2D echo was done showing overall normal  EF, limited views, ascending aorta not well visualized but no obvious disection and only mild dilatation, s/p MV repair with annuloplasty ring and mild residual MR. Dr. Cornelius Moras with [facility] consulted on the patient for concern for aortic dissection. He felt there was no evidence to support the presence of aortic dissection. Given unusual appearance of annuloplasty ring and inflow on 2D echo, he did recommend TEE. He also felt there was likely some contrast getting into the left atrial appendage with likely thrombus within the left atrial appendage despite oversewing. Dr. Cornelius Moras noted he would be a very poor candidate for cardiac surgery. TEE was performed 10/06/11 - see report below. Dr. Eden Emms reviewed the studies and felt the "mass" seen on TEE was a thrombosed LAA. Cardiac MRI was then undertaken demonstrating large LAA filled with thrombus but isolated from LA, no other pericardiac mass, measures 4.9cm by 2.1 cm, moderate LA enlargement, no RWMA, normal EF, no aortic dissection ascending aorta 3.3 cm with widely patent left subclavian artery, S/P mitral valve repair with annuloplasty ring with some disruption of lateral annuloplasty ring with "perivalvular" MR and some central MR, bilateral pleural effusions and moderately dilated MPA 3.5cm. This LAA thrombus was felt surgically isolated as evidenced by no contrast in the LA during cath and no flow in the LA by the TEE. Dr. Eden Emms noted since LAA is excluded there should be no thromboembolic risk from large clot burden in the appendage. The question remained about the functionality of his LIMA graft and ostial LAD disease. Ultimately the patient elected not to proceed with further cardiac catheterization to define nor intervene his anatomy. Medical therapy was recommended. The patient was without chest pain the last several days. Dr. Antoine Poche has seen and examined him today and feels he is stable for discharge. Of note the patient also had question of IV infiltration  from cardiac MRI yesterday and was evaluated by radiology prior to discharge who also felt he was stable to go home. Dr. Jens Som does not have any availability for appointments until mid September so we will have him follow up in office with an extender on a day when Dr. Jens Som is in the office all day.  Discharge Vitals: Blood pressure 125/72, pulse 78, temperature 98.1 F (36.7 C), temperature source Oral, resp. rate 13, height 6\' 3"  (1.905 m), weight 236 lb 15.9 oz (107.5 kg), SpO2 97.00%.  Labs: Lab Results  Component Value Date   WBC 6.0 10/08/2011   HGB 9.7* 10/08/2011   HCT 31.2* 10/08/2011   MCV 80.6 10/08/2011   PLT 200 10/08/2011     Lab 10/07/11 0630  NA 140  K 3.7  CL 105  CO2 23  BUN 9  CREATININE 1.29  CALCIUM 8.8  PROT --  BILITOT --  ALKPHOS --  ALT --  AST --  GLUCOSE 92    Lab Results  Component Value Date   CHOL 133 10/05/2011   HDL 50 10/05/2011   LDLCALC 71 10/05/2011   TRIG 62 10/05/2011     Diagnostic Studies/Procedures   1. 2D Echo Study Conclusions - Left ventricle: Limited views. No LVH overall EF appears in normal range - Aorta: Ascneding aorta not well visualized but no obvious disection and only mild dilatation - Mitral  valve: S/P repair with annuloplasty ring and mild residual MR Mild regurgitation  2. Mr Maxine Glenn Chest W Contrast 10/07/2011  Cardiac MRI:  Indication: Cardiac "Mass" Assess LA appendage  Comparison: Reviewed Cath 10/04/11, TTE 10/05/11, TEE 10/06/11 and CT done 10/04/11  Protocol:  The patient was scanned on a 1.5 Tesla GE magnet.  A dedicated cardiac coil was used.  Functional imaging was done using Fiesta sequences.  The LAA was visualized in the lateral, axial and basal short axis views.  The patient received 35 cc of Multihance. After 10 minutes inversion recovery sequences were done with long TI to assess for thrombus.  Quantitative EF was calculated from the cine short axis stack using modified Simpson's method on a dedicated Circle  work station.  Findings:  There were bilateral pleural effusions right greater than left. There was no pericardial effusion.  There was moderate LAE.  The RA/RV were normal.  The LV was normal with no discrete RWMA.  EF was 68% ( EDV 121cc, ESV 38cc, SV 83 cc)  The LAA was oversewn with no flow from LA.  It was enlarged measuring 4.9 cm in the AP direction and 2.1 cm in the cranial caudal direction from saggital slices.  It was filled with thrombus.  No flow could be seen within the thrombus.  There was no other pericardiac mass or hematoma.  The patient is S/P mitral valve repair.  There appears to be some disruption of the lateral annuloplasty ring with perivalvular MR as well as an area of central MR through the repair.  The LA itself is moderately enlarged with no thrombus or spontaneous contrast seen.  The AV is trileaflet and normal.  The PV is normal.  There is moderate dilatation of the MPA measuring 3.5 cm.  The TV is normal.  TRICKS imaging had poor spatial and temporal resolution and could not identify fistulous flow.  3D reconstruction of the ascending aorta showed no dissection with a widely patent but tortuous left subclavian artery.  Impression:     1)    Large LAA filled with thrombus but isolated from LA.  No       other pericardiac mass measures 4.9cm by 2.1 cm Moderate LA       enlargement        2)    Normal LV no RWMA;s EF 68% 3)    No aortic dissection ascending aorta 3.3 cm  with widely patent left subclavian artery 4)    S/P mitral valve repair with annuloplasty ring.  Some disruption of lateral annuloplasty ring with "perivalvular" MR and some central MR 5)    Bilateral pleural effusions 6)    Moderately dilated MPA 3.5 cm  Charlton Haws MD [facility]  Original Report Authenticated By: Marita Snellen   3. Chest Portable 1 View  10/04/2011  *RADIOLOGY REPORT*  Clinical Data: Chest pain and pressure, shortness of breath  PORTABLE CHEST - 1 VIEW  Comparison: Chest x-ray of 07/11/2011 and CT angio chest  of 07/11/2011  Findings: Cardiomegaly is stable.  There is prominence to the right hilum, which compared to the prior CT most likely is due to prominent pulmonary artery segment.  Mild volume loss is noted in the right mid lung.  No effusion is seen.  Median sternotomy sutures are noted.  IMPRESSION: Moderate cardiomegaly.  Probable volume loss in the right midlung with prominent pulmonary artery segments most likely accounting for the prominence of the right hilum.  Original Report Authenticated By: Colon Flattery.  Gery Pray, M.D.   4. Ct Angio Chest Aortic Dissect W &/or W/o 10/07/2011  **ADDENDUM** CREATED: 10/07/2011 11:57:00  Upon further review there appears to be a filling defect within the left atrial appendage.  The left atrial appendage is rather large (particularly given the reported history of left atrial appendage occlusion at prior surgery).  Although there is a central filling defect within the left atrial appendage, there is avid enhancement around the periphery of the left atrial appendage.  These findings correspond with unusual findings noted a recent cardiac catheterization.  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 Although there does appear to be a large filling defect (presumably thrombus) within the left atrial appendage, given the prior history of atrial appendage occlusion, the clinical relevance of this thrombus is doubtful, as this is a not likely to serve as an embolic source.  These findings were discussed with Dr. Eden Emms on 10/07/2011.  **END ADDENDUM** SIGNED BY: Florencia Reasons, M.D.   10/05/2011  **ADDENDUM** CREATED: 10/05/2011 18:31:26  Review of the chest CTA study demonstrates evidence of thrombus in the left atrial appendage.  No other  cardiac chamber thrombus is identified.  The right heart is not opacified well with contrast at the time of imaging.  **END ADDENDUM** SIGNED BY: Sherrine Maples T. Fredia Sorrow, M.D.   10/05/2011  *RADIOLOGY REPORT*  Clinical Data:  Status post prior CABG and mitral valve replacement.  Coronary arteriography today demonstrated possible dissection flap in the proximal left subclavian artery and poor opacification of a LIMA graft.  CT ANGIOGRAPHY CHEST, ABDOMEN AND PELVIS  Technique:  Multidetector CT imaging through the chest, abdomen and pelvis was performed using the standard protocol during bolus administration of intravenous contrast.  Multiplanar reconstructed images including MIPs were obtained and reviewed to evaluate the vascular anatomy.  Contrast: 80mL OMNIPAQUE IOHEXOL 350 MG/ML SOLN  Comparison:   None.  CTA CHEST  Findings:  The thoracic aorta is normally patent and shows no evidence of dissection or aneurysmal disease.  Proximal great vessels are tortuous and demonstrate no evidence of dissection, significant plaque or stenoses.  No focal aneurysms are identified. In particular, the proximal left subclavian artery is tortuous but shows no focal dissection.  The left vertebral artery is well delineated proximally and normally patent.  The proximal left internal mammary artery is patent and small in caliber.  Distal delineation of LIMA graft patency is not accurately assessed by current CTA.  A saphenous vein graft is also not well delineated by CTA.  The small right pleural effusion present with right lower lobe atelectasis.  Trace amount of left pleural fluid present.  No overt pulmonary edema.  Scattered areas of parenchymal scarring present bilaterally.  No evidence of enlarged lymph nodes.  No pericardial effusion.   Review of the MIP images confirms the above findings.  IMPRESSION: There is no evidence by CTA of thoracic aortic dissection or dissection of the proximal great vessels.  All the great vessels show  moderate tortuosity.  The proximal aspect of the left internal mammary artery is demonstrated to be patent but very small in caliber.  Distal LIMA patency is not as well assessed by CTA.  CTA ABDOMEN AND PELVIS  Findings:  The abdominal aorta is of normal caliber and shows no evidence of aneurysmal  disease or dissection.  The celiac axis, superior mesenteric artery, bilateral paired renal arteries and the inferior mesenteric arteries show no evidence of significant disease.  Iliac and visualized femoral arteries show normal patency.  Nonvascular evaluation shows no solid organ abnormalities.  There is a right inguinal hernia containing fat.  Right-sided femoral arterial sheath present.  Degenerative changes are present in the lower lumbar spine.   Review of the MIP images confirms the above findings.  IMPRESSION: Normal caliber abdominal aorta and widely patent visceral arteries. No evidence of aortic dissection or significant arterial occlusive disease in the abdomen or pelvis.  Original Report Authenticated By: Florencia Reasons, M.D.   5. Ct Angio Abd/pel W/ And/or W/o 10/07/2011  **ADDENDUM** CREATED: 10/07/2011 11:57:00  Upon further review there appears to be a filling defect within the left atrial appendage.  The left atrial appendage is rather large (particularly given the reported history of left atrial appendage occlusion at prior surgery).  Although there is a central filling defect within the left atrial appendage, there is avid enhancement around the periphery of the left atrial appendage.  These findings correspond with unusual findings noted a recent cardiac catheterization.  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 Although there does appear to be a  large filling defect (presumably thrombus) within the left atrial appendage, given the prior history of atrial appendage occlusion, the clinical relevance of this thrombus is doubtful, as this is a not likely to serve as an embolic source.  These findings were discussed with Dr. Eden Emms on 10/07/2011.  **END ADDENDUM** SIGNED BY: Florencia Reasons, M.D.   10/05/2011  **ADDENDUM** CREATED: 10/05/2011 18:31:26  Review of the chest CTA study demonstrates evidence of thrombus in the left atrial appendage.  No other cardiac chamber thrombus is identified.  The right heart is not opacified well with contrast at the time of imaging.  **END ADDENDUM** SIGNED BY: Sherrine Maples T. Fredia Sorrow, M.D.   10/05/2011  *RADIOLOGY REPORT*  Clinical Data:  Status post prior CABG and mitral valve replacement.  Coronary arteriography today demonstrated possible dissection flap in the proximal left subclavian artery and poor opacification of a LIMA graft.  CT ANGIOGRAPHY CHEST, ABDOMEN AND PELVIS  Technique:  Multidetector CT imaging through the chest, abdomen and pelvis was performed using the standard protocol during bolus administration of intravenous contrast.  Multiplanar reconstructed images including MIPs were obtained and reviewed to evaluate the vascular anatomy.  Contrast: 80mL OMNIPAQUE IOHEXOL 350 MG/ML SOLN  Comparison:   None.  CTA CHEST  Findings:  The thoracic aorta is normally patent and shows no evidence of dissection or aneurysmal disease.  Proximal great vessels are tortuous and demonstrate no evidence of dissection, significant plaque or stenoses.  No focal aneurysms are identified. In particular, the proximal left subclavian artery is tortuous but shows no focal dissection.  The left vertebral artery is well delineated proximally and normally patent.  The proximal left internal mammary artery is patent and small in caliber.  Distal delineation of LIMA graft patency is not accurately assessed by current CTA.  A saphenous vein  graft is also not well delineated by CTA.  The small right pleural effusion present with right lower lobe atelectasis.  Trace amount of left pleural fluid present.  No overt pulmonary edema.  Scattered areas of parenchymal scarring present bilaterally.  No evidence of enlarged lymph nodes.  No pericardial effusion.   Review of the MIP images confirms the above findings.  IMPRESSION: There is no evidence by CTA of thoracic aortic dissection or dissection of the proximal great vessels.  All the great vessels show moderate tortuosity.  The proximal aspect of the left internal mammary artery is demonstrated to be patent but very small in caliber.  Distal LIMA patency is not as well assessed by CTA.  CTA ABDOMEN AND PELVIS  Findings:  The abdominal aorta is of normal caliber and shows no evidence of aneurysmal disease or dissection.  The celiac axis, superior mesenteric artery, bilateral paired renal arteries and the inferior mesenteric arteries show no evidence of significant disease.  Iliac and visualized femoral arteries show normal patency.  Nonvascular evaluation shows no solid organ abnormalities.  There is a right inguinal hernia containing fat.  Right-sided femoral arterial sheath present.  Degenerative changes are present in the lower lumbar spine.   Review of the MIP images confirms the above findings.  IMPRESSION: Normal caliber abdominal aorta and widely patent visceral arteries. No evidence of aortic dissection or significant arterial occlusive disease in the abdomen or pelvis.  Original Report Authenticated By: Florencia Reasons, M.D.   6. Cardiac catheterization this admission, please see full report and above for summary.  Discharge Medications   Medication List  As of 10/08/2011 11:39 AM   TAKE these medications         amiodarone 200 MG tablet   Commonly known as: PACERONE   Take 1 tablet (200 mg total) by mouth daily.      aspirin EC 81 MG tablet   Take 81 mg by mouth daily.       carbidopa-levodopa 25-100 MG per tablet   Commonly known as: SINEMET IR   Take 1 tablet by mouth 3 (three) times daily. 1 tablet at breakfast and lunch      clonazePAM 1 MG tablet   Commonly known as: KLONOPIN   Take 1 mg by mouth every 6 (six) hours.      desvenlafaxine 50 MG 24 hr tablet   Commonly known as: PRISTIQ   Take 50 mg by mouth at bedtime.      multivitamin with minerals Tabs   Take 1 tablet by mouth daily.      nitroGLYCERIN 0.4 MG SL tablet   Commonly known as: NITROSTAT   Place 1 tablet (0.4 mg total) under the tongue every 5 (five) minutes as needed for chest pain (up to 3 doses).      pantoprazole 40 MG tablet   Commonly known as: PROTONIX   Take 40 mg by mouth 2 (two) times daily.      polyethylene glycol packet   Commonly known as: MIRALAX / GLYCOLAX   Take 17 g by mouth daily as needed. For constipation      pravastatin 40 MG tablet   Commonly known as: PRAVACHOL   Take 1 tablet (40 mg total) by mouth every evening.      promethazine 25 MG tablet   Commonly known as: PHENERGAN   Take 25 mg by mouth every 6 (six) hours as needed. For nausea      Tamsulosin HCl 0.4 MG Caps   Commonly known as: FLOMAX   Take 0.4 mg by mouth daily.            Disposition   The patient will be discharged in  stable condition to home. Discharge Orders    Future Appointments: Provider: Department: Dept Phone: Center:   10/28/2011 8:50 AM Beatrice Lecher, PA [facility] [place] [phone] [id number]   01/05/2012 2:00 PM Lewayne Bunting, MD [facility] [place] [phone] [id number]     Future Orders Please Complete By Expires   Diet - low sodium heart healthy      Increase activity slowly      Comments:   No lifting over 5 lbs for 5 days. No sexual activity for 5 days. No driving unless cleared by your doctor. Keep procedure site clean & dry. If you notice increased pain, swelling, bleeding or pus, call/return!  You may shower, but no soaking baths/hot  tubs/pools for 1 week.     Follow-up Information    Follow up with Tereso Newcomer, PA. (10/28/11 at 8:50am with Tereso Newcomer PA-C at [facility] Office))    Contact information:   [address] [place] [address] [phone] [facility] Office            Duration of Discharge Encounter: Greater than 30 minutes including physician and PA time.  Signed, Ronie Spies PA-C 10/08/2011, 11:39 AM   Patient seen and examined.  Plan as discussed in my rounding note for today and outlined above. Fayrene Fearing Onie Kasparek  10/08/2011  1:21 PM

## 2011-10-08 NOTE — Progress Notes (Signed)
Patient ID: [name], male   DOB: [birth year], 71 y.o.   MRN: [id number] Called to see pt regarding possible contrast extravasation to left hand/wrist region during MRI chest 8/15. According to pt , left hand IV site began to hurt during contrasted study but he did not make tech aware. He received a total of 35 cc's gadolinium with 20 cc  NS flush via IV. On today's exam his left hand is mild-mod edematous but with good sens/motor fxn and intact radial pulse. There is no skin breakdown , blistering or erythema. The pt did receive an adequate contrasted study per the technonlogist implying that most of the gadolinium was intravenous instead of SQ. Recommendations at this point would be to periodically elevate left arm when possible and apply cool compresses to left hand. The pt is scheduled for discharge today and our service will contact him at home on 8/17 to recheck status.

## 2011-10-08 NOTE — Progress Notes (Signed)
    SUBJECTIVE:  He has had no chest pain for several days.  No SOB   PHYSICAL EXAM Filed Vitals:   10/07/11 2354 10/08/11 0354 10/08/11 0355 10/08/11 0500  BP:   100/62   Pulse:      Temp: 97.5 F (36.4 C) 98.2 F (36.8 C)    TempSrc: Oral Oral    Resp:   12 13  Height:      Weight:    236 lb 15.9 oz (107.5 kg)  SpO2: 97% 95%     General:  No distress Lungs:  Clear Heart:  RRR, systolic murmur Abdomen:  Positive bowel sounds, no rebound no guarding Extremities:  No edema, right groin without bruising or bleeding.    LABS: Lab Results  Component Value Date   CKTOTAL 56 10/05/2011   CKMB 2.0 10/05/2011   TROPONINI <0.30 10/05/2011   Results for orders placed during the hospital encounter of 10/04/11 (from the past 24 hour(s))  CBC     Status: Abnormal   Collection Time   10/08/11  5:43 AM      Component Value Range   WBC 6.0  4.0 - 10.5 K/uL   RBC 3.87 (*) 4.22 - 5.81 MIL/uL   Hemoglobin 9.7 (*) 13.0 - 17.0 g/dL   HCT 16.1 (*) 09.6 - 04.5 %   MCV 80.6  78.0 - 100.0 fL   MCH 25.1 (*) 26.0 - 34.0 pg   MCHC 31.1  30.0 - 36.0 g/dL   RDW 40.9 (*) 81.1 - 91.4 %   Platelets 200  150 - 400 K/uL    Intake/Output Summary (Last 24 hours) at 10/08/11 0746 Last data filed at 10/08/11 0600  Gross per 24 hour  Intake    720 ml  Output   1050 ml  Net   -330 ml   MRI:  1) Large LAA filled with thrombus but isolated from LA. No  other pericardiac mass measures 4.9cm by 2.1 cm Moderate LA  enlargement  2) Normal LV no RWMA;s EF 68%  3) No aortic dissection ascending aorta 3.3 cm with widely  patent left subclavian artery  4) S/P mitral valve repair with annuloplasty ring. Some  disruption of lateral annuloplasty ring with "perivalvular" MR and  some central MR  5) Bilateral pleural effusions  6) Moderately dilated MPA 3.5 cm   ASSESSMENT AND PLAN:  1. Acute coronary syndrome with crescendo angina.   Cath two days ago revealed a filling defect compromising the filling of  the LIMA.  There is an ostial LAD stenosis.  MRI findings as above.  He does not want further cardiac cath to further define his LIMA and subclavian anatomy.  Continue medical management.   2. History of paroxysmal atrial fibrillation:  Maintaining NSR on amiodarone.  He has not been on warfarin with fall risk.  Of note there is no evidence of LAA thrombus and LAA thrombus is excluded from the LA.  3. Hyperlipidemia.  LDL 65.  Continue current therapy.  4. Anemia.  This is long standing.  There is no evidence of active bleeding.  Stoll guaiac stools x 2 ordered (still not sent to the lab).  Home today.  Rollene Rotunda 10/08/2011 7:46 AM

## 2011-10-11 ENCOUNTER — Ambulatory Visit (INDEPENDENT_AMBULATORY_CARE_PROVIDER_SITE_OTHER): Payer: Medicare Other | Admitting: Cardiothoracic Surgery

## 2011-10-11 VITALS — BP 106/78 | HR 78 | Resp 16 | Ht 75.0 in | Wt 235.0 lb

## 2011-10-11 DIAGNOSIS — I4891 Unspecified atrial fibrillation: Secondary | ICD-10-CM

## 2011-10-11 DIAGNOSIS — I482 Chronic atrial fibrillation, unspecified: Secondary | ICD-10-CM

## 2011-10-11 DIAGNOSIS — I2510 Atherosclerotic heart disease of native coronary artery without angina pectoris: Secondary | ICD-10-CM

## 2011-10-11 DIAGNOSIS — I059 Rheumatic mitral valve disease, unspecified: Secondary | ICD-10-CM

## 2011-10-11 DIAGNOSIS — Z09 Encounter for follow-up examination after completed treatment for conditions other than malignant neoplasm: Secondary | ICD-10-CM

## 2011-10-11 DIAGNOSIS — I340 Nonrheumatic mitral (valve) insufficiency: Secondary | ICD-10-CM

## 2011-10-11 NOTE — Progress Notes (Signed)
PCP is Nelwyn Salisbury, MD Referring Provider is Nelwyn Salisbury, MD  Chief Complaint  Patient presents with  . Chest Pain    follow up to hospitalization to discuss options...refused recommended cath    HPI: Patient presents today to review the events of his recent hospitalization when he presented with chest pain and negative cardiac enzymes. Cardiac catheterization was performed showing a patent but atretic vein graft to the circumflex due to competitive flow, the left IMA graft to the LAD was not demonstrated due to inability to engage the vessel although the anterior wall is moving well and there is no collateralization to the distal LAD from the large patent RCA which would be expected with a 90% proximal LAD stenosis Transthoracic echo showed mild mitral regurgitation and a thrombus filled left atrial appendage Transesophageal echo showed the mitral regurgitation be mild to moderate There is no evidence of aortic dissection or hematoma on CTA and cardiac MRI All of these findings were discussed with the patient and wife. Because the left IMA graft was not visualized if the patient continues to have chest pain consideration of a stent to the LAD would be reasonable as the patient is not a candidate for redo surgery due to his severe comorbid disease from Parkinson's and his inability to walk well  Past Medical History  Diagnosis Date  . Atrial fibrillation     a. Paroxysmal, not Coumadin candidate 2/2 Parkinson's/gait instability  . PE (pulmonary embolism)     a. >20 years ago;  b. nl V:Q scan 10/2010  . DVT (deep venous thrombosis)   . Meningioma   . BPH (benign prostatic hyperplasia)   . Personal history of colonic polyps   . Venereal wart   . Depression   . Asthma   . Chickenpox   . Thrombophlebitis   . GERD (gastroesophageal reflux disease)   . Parkinson disease   . Dementia   . CAD (coronary artery disease)     a. 08/2009 s/p CABG x 2 (LIMA->LAD, SVG-OM). b. Adm w/ CP 09/2011 -  questionable functionality of LIMA graft but patient declined further catheterization (cath 10/05/11 -- ostial LAD disease, possible flap in prox L subclavian/unable to image LIMA well, atretic SVG-OM)  . Hyperlipidemia   . Stricture and stenosis of esophagus   . Hiatal hernia   . Diverticulosis of colon (without mention of hemorrhage)   . Mitral regurgitation     a. 08/2009 s/p MV repair 22mm Edwards ring and oversewing of LA appendage @ time of CABG;  b. cardiac MRI 09/2011 showing some disruption of lateral annuloplasty ring with "perivalvular" MR and some central MR   . Thrombus of left atrial appendage     a. Abnormal dye staining on cath 09/2011 - imaged by CT/MRI/TEE - felt to be surgically isolated from the LA    Past Surgical History  Procedure Date  . Inguinal hernia repair   . Tonsilectomy, adenoidectomy, bilateral myringotomy and tubes   . Varicose vein surgery     left leg  . Back surgery     herniated disc & remove bone spurs  . Coronary artery bypass graft   . Mitral valve repair   . Umbilical hernia repair   . External ear surgery   . Tee without cardioversion 10/06/2011    Procedure: TRANSESOPHAGEAL ECHOCARDIOGRAM (TEE);  Surgeon: Vesta Mixer, MD;  Location: [facility] ENDOSCOPY;  Service: Cardiovascular;  Laterality: N/A;    Family History  Problem Relation Age of Onset  .  Arthritis    . Diabetes    . Stroke    . Heart disease Mother     Social History History  Substance Use Topics  . Smoking status: Former Games developer  . Smokeless tobacco: Never Used  . Alcohol Use: No    Current Outpatient Prescriptions  Medication Sig Dispense Refill  . amiodarone (PACERONE) 200 MG tablet Take 1 tablet (200 mg total) by mouth daily.      Marland Kitchen aspirin EC 81 MG tablet Take 81 mg by mouth daily.      . carbidopa-levodopa (SINEMET) 25-100 MG per tablet Take 1 tablet by mouth 3 (three) times daily. 1 tablet at breakfast and lunch      . clonazePAM (KLONOPIN) 1 MG tablet Take 1 mg by  mouth every 6 (six) hours.      Marland Kitchen desvenlafaxine (PRISTIQ) 50 MG 24 hr tablet Take 50 mg by mouth at bedtime.      . Multiple Vitamin (MULITIVITAMIN WITH MINERALS) TABS Take 1 tablet by mouth daily.      . nitroGLYCERIN (NITROSTAT) 0.4 MG SL tablet Place 1 tablet (0.4 mg total) under the tongue every 5 (five) minutes as needed for chest pain (up to 3 doses).  25 tablet  4  . pantoprazole (PROTONIX) 40 MG tablet Take 40 mg by mouth 2 (two) times daily.      . polyethylene glycol (MIRALAX / GLYCOLAX) packet Take 17 g by mouth daily as needed. For constipation      . pravastatin (PRAVACHOL) 40 MG tablet Take 1 tablet (40 mg total) by mouth every evening.  30 tablet  12  . promethazine (PHENERGAN) 25 MG tablet Take 25 mg by mouth every 6 (six) hours as needed. For nausea      . Tamsulosin HCl (FLOMAX) 0.4 MG CAPS Take 0.4 mg by mouth daily.      Marland Kitchen DISCONTD: desvenlafaxine (PRISTIQ) 50 MG 24 hr tablet Take 1 tablet (50 mg total) by mouth daily.  60 tablet  0    Allergies  Allergen Reactions  . Ibuprofen     Stomach hurts    Review of Systems weight gain generalized weakness and tremor worse, inability to swallow solid food is worse  BP 106/78  Pulse 78  Resp 16  Ht 6\' 3"  (1.905 m)  Wt 235 lb (106.595 kg)  BMI 29.37 kg/m2  SpO2 96% Physical Exam Generally weak and wheelchair with tremor and masked facies Lungs clear 2/6 systolic murmur with some radiation to the left axilla Regular rhythm Minimal edema peripherally  Diagnostic Tests: 2-D echo, TEE, cardiac arteriogram, CTA, and report of cardiac MRI are reviewed with patient and wife  Impression: Chest pain which led to hospitalization of unclear etiology with several potential causative factors including anxiety, GERD, and knon history coronary disease \ Mitral regurgitation should be followed with echocardiogram very 1-2 years and blood pressure control  Plan: Patient is not a candidate for redo heart surgery due to his severe  comorbid disease. Ifchest pain consistent with angina  recurs repeat cardiac catheterization to further demonstrate the left IMA would be reasonable with a PCI of the LAD the best choice if an intervention as needed.

## 2011-10-11 NOTE — Patient Instructions (Signed)
Your heart pumping action is normal Your cardiac catheterization showed the vein bypass to the left side of the heart has become atretic or small do to competitive flow from the native vessel The mammary artery bypass to the front of the heart is not well-demonstrated by the cath but is probably patent The mitral valve repair now has mild regurgitation The left atrial appendage is filled with a thrombus or clot which should not put you at risk for stroke as it was previously oversewn at surgery in 2011  I would recommend continuing your current medications and if chest pain continues a stent to the LAD may be helpful in the future I'll recommend a 2-D echocardiogram of her heart every one to 2 years during your follow up with Dr. Jens Som

## 2011-10-19 ENCOUNTER — Telehealth: Payer: Self-pay | Admitting: Family Medicine

## 2011-10-19 NOTE — Telephone Encounter (Signed)
[address] [place], [place] [address] p. [phone] f. [phone] To: [facility] (After Hours Triage) Fax: [phone] From: [facility] Date/ Time: 10/18/2011 5:37 PM Taken By: Ree Shay, CSR Caller: Pamelia Hoit Facility: not collected Patient: [name] DOB: 11-Nov-1940 Phone: [phone] Reason for Call: Attn Dr [name]: Pt is currently taking prestiq. Pt is having trouble sleeping and isn't sure if medication needs to be changed. Please call with instruction on how to proceed. Instructed spouse to call back and verify message was received.

## 2011-10-19 NOTE — Telephone Encounter (Signed)
Caller: [name]/Spouse; Patient Name: [name]; PCP: Gershon Crane [facility]); Best Callback Phone Number: [phone].  Calling regarding Patient not sleeping well, chronic problem due to Parkinson per Wife.  MD gave Pristiq 1 month ago, Patient still not sleeping well at night. Patient is naping during the day.  Patient hospitalized for A Fib week of 8-12.  All emergent symptoms ruled out per Sleep Disorders Protocol, see in 72 hours.  Wife would like to discuss Pristig with MD, if he feels like it will take longer to help Patient sleep or is it possibly not working since it's been almost 1 month since starting.  Please follow up with Wife.

## 2011-10-20 ENCOUNTER — Telehealth: Payer: Self-pay | Admitting: Cardiology

## 2011-10-20 ENCOUNTER — Inpatient Hospital Stay (HOSPITAL_COMMUNITY)
Admission: EM | Admit: 2011-10-20 | Discharge: 2011-10-22 | DRG: 287 | Disposition: A | Discharge status: Home / Self Care | Payer: Medicare Other | Attending: Internal Medicine | Admitting: Internal Medicine

## 2011-10-20 ENCOUNTER — Encounter (HOSPITAL_COMMUNITY): Payer: Self-pay | Admitting: Emergency Medicine

## 2011-10-20 ENCOUNTER — Emergency Department (HOSPITAL_COMMUNITY): Payer: Medicare Other

## 2011-10-20 DIAGNOSIS — Z886 Allergy status to analgesic agent status: Secondary | ICD-10-CM

## 2011-10-20 DIAGNOSIS — Z9889 Other specified postprocedural states: Secondary | ICD-10-CM

## 2011-10-20 DIAGNOSIS — I2510 Atherosclerotic heart disease of native coronary artery without angina pectoris: Principal | ICD-10-CM | POA: Diagnosis present

## 2011-10-20 DIAGNOSIS — K219 Gastro-esophageal reflux disease without esophagitis: Secondary | ICD-10-CM | POA: Diagnosis present

## 2011-10-20 DIAGNOSIS — Z87891 Personal history of nicotine dependence: Secondary | ICD-10-CM

## 2011-10-20 DIAGNOSIS — I4891 Unspecified atrial fibrillation: Secondary | ICD-10-CM | POA: Diagnosis present

## 2011-10-20 DIAGNOSIS — Z7982 Long term (current) use of aspirin: Secondary | ICD-10-CM

## 2011-10-20 DIAGNOSIS — J45909 Unspecified asthma, uncomplicated: Secondary | ICD-10-CM | POA: Diagnosis present

## 2011-10-20 DIAGNOSIS — F411 Generalized anxiety disorder: Secondary | ICD-10-CM | POA: Diagnosis present

## 2011-10-20 DIAGNOSIS — E785 Hyperlipidemia, unspecified: Secondary | ICD-10-CM | POA: Diagnosis present

## 2011-10-20 DIAGNOSIS — Z8249 Family history of ischemic heart disease and other diseases of the circulatory system: Secondary | ICD-10-CM

## 2011-10-20 DIAGNOSIS — Z860100 Personal history of colon polyps, unspecified: Secondary | ICD-10-CM

## 2011-10-20 DIAGNOSIS — F329 Major depressive disorder, single episode, unspecified: Secondary | ICD-10-CM | POA: Diagnosis present

## 2011-10-20 DIAGNOSIS — Z86711 Personal history of pulmonary embolism: Secondary | ICD-10-CM

## 2011-10-20 DIAGNOSIS — F3289 Other specified depressive episodes: Secondary | ICD-10-CM | POA: Diagnosis present

## 2011-10-20 DIAGNOSIS — K5730 Diverticulosis of large intestine without perforation or abscess without bleeding: Secondary | ICD-10-CM | POA: Diagnosis present

## 2011-10-20 DIAGNOSIS — I10 Essential (primary) hypertension: Secondary | ICD-10-CM | POA: Diagnosis present

## 2011-10-20 DIAGNOSIS — I513 Intracardiac thrombosis, not elsewhere classified: Secondary | ICD-10-CM

## 2011-10-20 DIAGNOSIS — Z951 Presence of aortocoronary bypass graft: Secondary | ICD-10-CM

## 2011-10-20 DIAGNOSIS — F0280 Dementia in other diseases classified elsewhere without behavioral disturbance: Secondary | ICD-10-CM | POA: Diagnosis present

## 2011-10-20 DIAGNOSIS — Z79899 Other long term (current) drug therapy: Secondary | ICD-10-CM

## 2011-10-20 DIAGNOSIS — Z86718 Personal history of other venous thrombosis and embolism: Secondary | ICD-10-CM

## 2011-10-20 DIAGNOSIS — R002 Palpitations: Secondary | ICD-10-CM

## 2011-10-20 DIAGNOSIS — I209 Angina pectoris, unspecified: Secondary | ICD-10-CM | POA: Diagnosis present

## 2011-10-20 DIAGNOSIS — I200 Unstable angina: Secondary | ICD-10-CM

## 2011-10-20 DIAGNOSIS — K449 Diaphragmatic hernia without obstruction or gangrene: Secondary | ICD-10-CM | POA: Diagnosis present

## 2011-10-20 DIAGNOSIS — N400 Enlarged prostate without lower urinary tract symptoms: Secondary | ICD-10-CM | POA: Diagnosis present

## 2011-10-20 DIAGNOSIS — N289 Disorder of kidney and ureter, unspecified: Secondary | ICD-10-CM | POA: Diagnosis present

## 2011-10-20 HISTORY — DX: Unspecified osteoarthritis, unspecified site: M19.90

## 2011-10-20 HISTORY — DX: Anxiety disorder, unspecified: F41.9

## 2011-10-20 LAB — CBC WITH DIFFERENTIAL/PLATELET
Eosinophils Absolute: 0.5 10*3/uL (ref 0.0–0.7)
Eosinophils Relative: 5 % (ref 0–5)
Hemoglobin: 10 g/dL — ABNORMAL LOW (ref 13.0–17.0)
Lymphocytes Relative: 15 % (ref 12–46)
Lymphs Abs: 1.3 10*3/uL (ref 0.7–4.0)
MCH: 24.8 pg — ABNORMAL LOW (ref 26.0–34.0)
MCV: 81.1 fL (ref 78.0–100.0)
Monocytes Relative: 9 % (ref 3–12)
Neutrophils Relative %: 71 % (ref 43–77)
RBC: 4.03 MIL/uL — ABNORMAL LOW (ref 4.22–5.81)
WBC: 9 10*3/uL (ref 4.0–10.5)

## 2011-10-20 LAB — COMPREHENSIVE METABOLIC PANEL
ALT: 5 U/L (ref 0–53)
Alkaline Phosphatase: 57 U/L (ref 39–117)
BUN: 16 mg/dL (ref 6–23)
CO2: 22 mEq/L (ref 19–32)
GFR calc Af Amer: 52 mL/min — ABNORMAL LOW (ref >90)
GFR calc non Af Amer: 45 mL/min — ABNORMAL LOW (ref >90)
Glucose, Bld: 120 mg/dL — ABNORMAL HIGH (ref 70–99)
Potassium: 4.5 mEq/L (ref 3.5–5.1)
Sodium: 136 mEq/L (ref 135–145)
Total Bilirubin: 0.3 mg/dL (ref 0.3–1.2)
Total Protein: 6.7 g/dL (ref 6.0–8.3)

## 2011-10-20 LAB — APTT: aPTT: 29 seconds (ref 24–37)

## 2011-10-20 LAB — TROPONIN I: Troponin I: <0.3 ng/mL (ref <0.30)

## 2011-10-20 MED ORDER — ONDANSETRON HCL 4 MG/2ML IJ SOLN: 4.0000 mg | Freq: Four times a day (QID) | INTRAMUSCULAR | Status: DC | PRN

## 2011-10-20 MED ORDER — CLONAZEPAM 1 MG PO TABS
1.0000 mg | ORAL_TABLET | Freq: Four times a day (QID) | ORAL | Status: DC
  Administered 2011-10-21 – 2011-10-22 (×8): 1 mg via ORAL
  Filled 2011-10-20 (×8): qty 1

## 2011-10-20 MED ORDER — ACETAMINOPHEN 325 MG PO TABS: 650.0000 mg | ORAL_TABLET | ORAL | Status: DC | PRN

## 2011-10-20 MED ORDER — ASPIRIN EC 325 MG PO TBEC
325.0000 mg | DELAYED_RELEASE_TABLET | Freq: Every day | ORAL | Status: DC
  Administered 2011-10-21 – 2011-10-22 (×2): 325 mg via ORAL
  Filled 2011-10-20 (×2): qty 1

## 2011-10-20 MED ORDER — CARBIDOPA-LEVODOPA 25-100 MG PO TABS
1.0000 | ORAL_TABLET | Freq: Three times a day (TID) | ORAL | Status: DC
  Administered 2011-10-21 – 2011-10-22 (×5): 1 via ORAL
  Filled 2011-10-20 (×7): qty 1

## 2011-10-20 MED ORDER — PANTOPRAZOLE SODIUM 40 MG PO TBEC
40.0000 mg | DELAYED_RELEASE_TABLET | Freq: Two times a day (BID) | ORAL | Status: DC
  Administered 2011-10-21 – 2011-10-22 (×4): 40 mg via ORAL
  Filled 2011-10-20 (×5): qty 1

## 2011-10-20 MED ORDER — NITROGLYCERIN IN D5W 200-5 MCG/ML-% IV SOLN
5.0000 ug/min | INTRAVENOUS | Status: DC
  Administered 2011-10-20: 5 ug/min via INTRAVENOUS
  Filled 2011-10-20: qty 250

## 2011-10-20 MED ORDER — TAMSULOSIN HCL 0.4 MG PO CAPS
0.4000 mg | ORAL_CAPSULE | Freq: Every day | ORAL | Status: DC
  Administered 2011-10-21 – 2011-10-22 (×2): 0.4 mg via ORAL
  Filled 2011-10-20 (×2): qty 1

## 2011-10-20 MED ORDER — HEPARIN BOLUS VIA INFUSION
4000.0000 [IU] | Freq: Once | INTRAVENOUS | Status: AC
  Administered 2011-10-20: 4000 [IU] via INTRAVENOUS

## 2011-10-20 MED ORDER — NITROGLYCERIN 0.4 MG SL SUBL: 0.4000 mg | SUBLINGUAL_TABLET | SUBLINGUAL | Status: DC | PRN

## 2011-10-20 MED ORDER — AMIODARONE HCL 200 MG PO TABS
200.0000 mg | ORAL_TABLET | Freq: Every day | ORAL | Status: DC
  Administered 2011-10-21 – 2011-10-22 (×2): 200 mg via ORAL
  Filled 2011-10-20 (×2): qty 1

## 2011-10-20 MED ORDER — CLOPIDOGREL BISULFATE 300 MG PO TABS
600.0000 mg | ORAL_TABLET | Freq: Once | ORAL | Status: DC
  Filled 2011-10-20: qty 2

## 2011-10-20 MED ORDER — SODIUM CHLORIDE 0.9 % IV SOLN: INTRAVENOUS | Status: DC

## 2011-10-20 MED ORDER — SIMVASTATIN 20 MG PO TABS
20.0000 mg | ORAL_TABLET | Freq: Every day | ORAL | Status: DC
  Administered 2011-10-21 – 2011-10-22 (×2): 20 mg via ORAL
  Filled 2011-10-20 (×2): qty 1

## 2011-10-20 MED ORDER — POLYETHYLENE GLYCOL 3350 17 G PO PACK
17.0000 g | PACK | Freq: Every day | ORAL | Status: DC | PRN
  Filled 2011-10-20: qty 1

## 2011-10-20 MED ORDER — HEPARIN (PORCINE) IN NACL 100-0.45 UNIT/ML-% IJ SOLN
1450.0000 [IU]/h | INTRAMUSCULAR | Status: DC
  Administered 2011-10-20: 1300 [IU]/h via INTRAVENOUS
  Administered 2011-10-21: 1450 [IU]/h via INTRAVENOUS
  Filled 2011-10-20 (×3): qty 250

## 2011-10-20 MED ORDER — CLOPIDOGREL BISULFATE 75 MG PO TABS: 75.0000 mg | ORAL_TABLET | Freq: Every day | ORAL | Status: DC

## 2011-10-20 NOTE — ED Notes (Signed)
Pt reports increased CP.

## 2011-10-20 NOTE — Progress Notes (Signed)
ANTICOAGULATION CONSULT NOTE - Initial Consult  Pharmacy Consult for Heparin Indication: chest pain/ACS  Allergies  Allergen Reactions  . Ibuprofen     Stomach hurts    Patient Measurements: Height: 6\' 3"  (190.5 cm) Weight: 235 lb (106.595 kg) IBW/kg (Calculated) : 84.5  Heparin Dosing Weight: 106 Kg  Vital Signs: Temp: 97.6 F (36.4 C) (08/28 1836) Temp src: Oral (08/28 1836) BP: 112/70 mmHg (08/28 2003) Pulse Rate: 74  (08/28 2000)  Labs:  Basename 10/20/11 1924 10/20/11 1923  HGB -- 10.0*  HCT -- 32.7*  PLT -- 240  APTT -- 29  LABPROT -- 15.4*  INR -- 1.19  HEPARINUNFRC -- --  CREATININE -- 1.52*  CKTOTAL -- --  CKMB -- --  TROPONINI <0.30 --    The CrCl is unknown because both a height and weight (above a minimum accepted value) are required for this calculation.   Medical History: Past Medical History  Diagnosis Date  . Atrial fibrillation     a. Paroxysmal, not Coumadin candidate 2/2 Parkinson's/gait instability  . PE (pulmonary embolism)     a. >20 years ago;  b. nl V:Q scan 10/2010  . DVT (deep venous thrombosis)   . Meningioma   . BPH (benign prostatic hyperplasia)   . Personal history of colonic polyps   . Venereal wart   . Depression   . Asthma   . Chickenpox   . Thrombophlebitis   . GERD (gastroesophageal reflux disease)   . Parkinson disease   . Dementia   . CAD (coronary artery disease)     a. 08/2009 s/p CABG x 2 (LIMA->LAD, SVG-OM). b. Adm w/ CP 09/2011 - questionable functionality of LIMA graft but patient declined further catheterization (cath 10/05/11 -- ostial LAD disease, possible flap in prox L subclavian/unable to image LIMA well, atretic SVG-OM)  . Hyperlipidemia   . Stricture and stenosis of esophagus   . Hiatal hernia   . Diverticulosis of colon (without mention of hemorrhage)   . Mitral regurgitation     a. 08/2009 s/p MV repair 22mm Edwards ring and oversewing of LA appendage @ time of CABG;  b. cardiac MRI 09/2011 showing  some disruption of lateral annuloplasty ring with "perivalvular" MR and some central MR   . Thrombus of left atrial appendage     a. Abnormal dye staining on cath 09/2011 - imaged by CT/MRI/TEE - felt to be surgically isolated from the LA    Medications:  Amiodarone, asa, sinemet, clonazepam, pristiq, MVI, ntg, protonix, miralax, pravachol, promethazine, tamsulosin  Assessment: Patient with known CAD admitted with chest pain. Noted not on anticoagulants PTA, baseline labs are WNL for plts and INR, H/H slightly low at baseline and SCr elevated.  Goal of Therapy:  Heparin level 0.3-0.7 units/ml Monitor platelets by anticoagulation protocol: Yes   Plan:  Give 4000 units bolus x 1 Start heparin infusion at 1300 units/hr Check anti-Xa level in 8 hours and daily while on heparin Continue to monitor H&H and platelets  Thanks, Mayzee Reichenbach K. Allena Katz, PharmD, BCPS.  Clinical Pharmacist Pager [phone]. 10/20/2011 9:31 PM

## 2011-10-20 NOTE — ED Notes (Signed)
Pt c/o CP 4/10. Pt reports he has been getting these episodes of generalized numbness that last about 45 minutes along with his CP, he has had 2 episodes today. Pt reports he pain decreased after the medication EMS administered, and now he feels he is having more of chest pressure than actual pain. Pt laying in bed, belching and taking deep breaths.

## 2011-10-20 NOTE — ED Provider Notes (Addendum)
History     CSN: [id number]  Arrival date & time 10/20/11  1826   First MD Initiated Contact with Patient 10/20/11 1849      Chief Complaint  Patient presents with  . Chest Pain    (Consider location/radiation/quality/duration/timing/severity/associated sxs/prior treatment) Patient is a 71 y.o. male presenting with chest pain. The history is provided by the patient.  Chest Pain The chest pain began 1 - 2 hours ago. Duration of episode(s) is 1 hour. Chest pain occurs constantly. The chest pain is improving. Associated with: started while eating tonight. At its most intense, the pain is at 10/10. The pain is currently at 2/10. The severity of the pain is severe. The quality of the pain is described as aching, heavy and pressure-like. The pain does not radiate. Primary symptoms include shortness of breath. Pertinent negatives for primary symptoms include no fever, no cough, no nausea and no vomiting.  Associated symptoms include diaphoresis. He tried nitroglycerin for the symptoms. Risk factors: multiple risk factors.  His past medical history is significant for CAD, hyperlipidemia, hypertension and MI.  Pertinent negatives for past medical history include no aortic dissection.  His family medical history is significant for CAD in family.  Procedure history is positive for cardiac catheterization.     Past Medical History  Diagnosis Date  . Atrial fibrillation     a. Paroxysmal, not Coumadin candidate 2/2 Parkinson's/gait instability  . PE (pulmonary embolism)     a. >20 years ago;  b. nl V:Q scan 10/2010  . DVT (deep venous thrombosis)   . Meningioma   . BPH (benign prostatic hyperplasia)   . Personal history of colonic polyps   . Venereal wart   . Depression   . Asthma   . Chickenpox   . Thrombophlebitis   . GERD (gastroesophageal reflux disease)   . Parkinson disease   . Dementia   . CAD (coronary artery disease)     a. 08/2009 s/p CABG x 2 (LIMA->LAD, SVG-OM). b. Adm w/ CP  09/2011 - questionable functionality of LIMA graft but patient declined further catheterization (cath 10/05/11 -- ostial LAD disease, possible flap in prox L subclavian/unable to image LIMA well, atretic SVG-OM)  . Hyperlipidemia   . Stricture and stenosis of esophagus   . Hiatal hernia   . Diverticulosis of colon (without mention of hemorrhage)   . Mitral regurgitation     a. 08/2009 s/p MV repair 22mm Edwards ring and oversewing of LA appendage @ time of CABG;  b. cardiac MRI 09/2011 showing some disruption of lateral annuloplasty ring with "perivalvular" MR and some central MR   . Thrombus of left atrial appendage     a. Abnormal dye staining on cath 09/2011 - imaged by CT/MRI/TEE - felt to be surgically isolated from the LA    Past Surgical History  Procedure Date  . Inguinal hernia repair   . Tonsilectomy, adenoidectomy, bilateral myringotomy and tubes   . Varicose vein surgery     left leg  . Back surgery     herniated disc & remove bone spurs  . Coronary artery bypass graft   . Mitral valve repair   . Umbilical hernia repair   . External ear surgery   . Tee without cardioversion 10/06/2011    Procedure: TRANSESOPHAGEAL ECHOCARDIOGRAM (TEE);  Surgeon: Vesta Mixer, MD;  Location: [facility] ENDOSCOPY;  Service: Cardiovascular;  Laterality: N/A;    Family History  Problem Relation Age of Onset  . Arthritis    .  Diabetes    . Stroke    . Heart disease Mother     History  Substance Use Topics  . Smoking status: Former Games developer  . Smokeless tobacco: Never Used  . Alcohol Use: No      Review of Systems  Constitutional: Positive for diaphoresis. Negative for fever.  Respiratory: Positive for shortness of breath. Negative for cough.   Cardiovascular: Positive for chest pain.  Gastrointestinal: Negative for nausea and vomiting.  All other systems reviewed and are negative.    Allergies  Ibuprofen  Home Medications   Current Outpatient Rx  Name Route Sig Dispense Refill  .  AMIODARONE HCL 200 MG PO TABS Oral Take 1 tablet (200 mg total) by mouth daily.    . ASPIRIN EC 81 MG PO TBEC Oral Take 81 mg by mouth daily.    Marland Kitchen CARBIDOPA-LEVODOPA 25-100 MG PO TABS Oral Take 1 tablet by mouth 3 (three) times daily. 1 tablet at breakfast and lunch    . CLONAZEPAM 1 MG PO TABS Oral Take 1 mg by mouth every 6 (six) hours.    . DESVENLAFAXINE SUCCINATE ER 50 MG PO TB24 Oral Take 50 mg by mouth at bedtime.    . ADULT MULTIVITAMIN W/MINERALS CH Oral Take 1 tablet by mouth daily.    Marland Kitchen NITROGLYCERIN 0.4 MG SL SUBL Sublingual Place 1 tablet (0.4 mg total) under the tongue every 5 (five) minutes as needed for chest pain (up to 3 doses). 25 tablet 4  . PANTOPRAZOLE SODIUM 40 MG PO TBEC Oral Take 40 mg by mouth 2 (two) times daily.    Marland Kitchen POLYETHYLENE GLYCOL 3350 PO PACK Oral Take 17 g by mouth daily as needed. For constipation    . PRAVASTATIN SODIUM 40 MG PO TABS Oral Take 1 tablet (40 mg total) by mouth every evening. 30 tablet 12  . PROMETHAZINE HCL 25 MG PO TABS Oral Take 25 mg by mouth every 6 (six) hours as needed. For nausea    . TAMSULOSIN HCL 0.4 MG PO CAPS Oral Take 0.4 mg by mouth daily.      BP 101/66  Pulse 81  Temp 97.6 F (36.4 C) (Oral)  Resp 18  SpO2 94%  Physical Exam  Nursing note and vitals reviewed. Constitutional: He is oriented to person, place, and time. He appears well-developed and well-nourished. No distress.  HENT:  Head: Normocephalic and atraumatic.  Mouth/Throat: Oropharynx is clear and moist.  Eyes: Conjunctivae and EOM are normal. Pupils are equal, round, and reactive to light.  Neck: Normal range of motion. Neck supple.  Cardiovascular: Normal rate, regular rhythm and intact distal pulses.   No murmur heard. Pulmonary/Chest: Effort normal and breath sounds normal. No respiratory distress. He has no wheezes. He has no rales.       Healed sternotomy scars  Abdominal: Soft. He exhibits no distension. There is no tenderness. There is no rebound  and no guarding.  Musculoskeletal: Normal range of motion. He exhibits no edema and no tenderness.  Neurological: He is alert and oriented to person, place, and time.  Skin: Skin is warm and dry. No rash noted. No erythema.  Psychiatric: He has a normal mood and affect. His behavior is normal.    ED Course  Procedures (including critical care time)  Labs Reviewed  CBC WITH DIFFERENTIAL - Abnormal; Notable for the following:    RBC 4.03 (*)     Hemoglobin 10.0 (*)     HCT 32.7 (*)  MCH 24.8 (*)     RDW 16.7 (*)     All other components within normal limits  COMPREHENSIVE METABOLIC PANEL - Abnormal; Notable for the following:    Glucose, Bld 120 (*)     Creatinine, Ser 1.52 (*)     GFR calc non Af Amer 45 (*)     GFR calc Af Amer 52 (*)     All other components within normal limits  PROTIME-INR - Abnormal; Notable for the following:    Prothrombin Time 15.4 (*)     All other components within normal limits  TROPONIN I  APTT  TROPONIN I  TROPONIN I   Dg Chest Port 1 View  10/20/2011  *RADIOLOGY REPORT*  Clinical Data: Chest pain.  PORTABLE CHEST - 1 VIEW  Comparison: CT chest 07/11/2011 and plain film chest 10/03/2012.  Findings: The patient is status post CABG.  There is marked cardiomegaly.  Small bilateral pleural effusions and basilar atelectasis appear greater on the right.  No pulmonary edema.  IMPRESSION:  1.  Small bilateral pleural effusions and basilar atelectasis, greater on the right. 2.  Cardiomegaly without edema.   Original Report Authenticated By: Bernadene Bell. Maricela Curet, M.D.      Date: 10/20/2011  Rate: 81  Rhythm: normal sinus rhythm  QRS Axis: right  Intervals: QT prolonged  ST/T Wave abnormalities: normal  Conduction Disutrbances:none  Narrative Interpretation:   Old EKG Reviewed: unchanged  CRITICAL CARE Performed by: Gwyneth Sprout   Total critical care time: 30  Critical care time was exclusive of separately billable procedures and treating  other patients.  Critical care was necessary to treat or prevent imminent or life-threatening deterioration.  Critical care was time spent personally by me on the following activities: development of treatment plan with patient and/or surrogate as well as nursing, discussions with consultants, evaluation of patient's response to treatment, examination of patient, obtaining history from patient or surrogate, ordering and performing treatments and interventions, ordering and review of laboratory studies, ordering and review of radiographic studies, pulse oximetry and re-evaluation of patient's condition.   1. Unstable angina       MDM   Patient with extensive cardiac history who is currently in sinus rhythm today but presents do to 2 episodes of chest pain the most recent starting at 5:30. Patient states he still having some tightness and shortness of breath. Patient was recently hospitalized on August 14 and at that time they felt he may need a stent in his LIMA however at that time he refused catheterization. However now since the patient had ongoing chest pain he is agreeable to have a catheterization. EKG is unchanged. Chest x-ray, CBC, CMP, troponin, coags pending.  Patient has already received aspirin and 3 nitroglycerin however he is having persistent pain so he was started on a nitro drip.  8:39 PM Labs are within normal limits. Patient is now feeling better. Spoke with cardiology and heparin drip started and patient will be admitted      Gwyneth Sprout, MD 10/20/11 2040  Gwyneth Sprout, MD 10/20/11 2040  Gwyneth Sprout, MD 10/20/11 2041

## 2011-10-20 NOTE — Telephone Encounter (Signed)
Spoke with pt wife, he had an episode yesterday of chest tightness that was relieved by 2 NTG. Today again he has developed chest tightness and he feels he is in atrial fib. He has currently had 2 NTG and cont to have discomfort. Pt wife wonders what to do. She reports this is the same thing that happened before. After his recent cath they are trying to decide whether or not to do a stent. Pt wife advised if cont to have pain he will need to go to the ER for eval. She voiced understanding.

## 2011-10-20 NOTE — Telephone Encounter (Signed)
First off , make sure he is taking the Pristiq in the morning not at night. If he already is, tell them to double this up to 2 tablets every morning (a total of 100 mg) and see how this works

## 2011-10-20 NOTE — ED Notes (Signed)
Pt's wife reports pt is feeling numb and has gone into a.fib. Pt NSR on EKG monitor. Pt reports he feels like he is in A. Fib, and has the numbness feeling.

## 2011-10-20 NOTE — H&P (Signed)
Cardiology H&P  Primary Care Povider: Nelwyn Salisbury, MD Primary Cardiologist: Jens Som   HPI: [name] is a 70 y.o.male with CAD s/p CABG, pAF, and Parkinson's who presents to the ED with chest pain.  He was recently in the hospital with similar symptoms and underwent LHC.  His SVG was atretic but his LIMA was not well visualized in the setting of an ostial LAD lesion.  He had a fairly complicated cath in which there was concern for aortic dissection leading to a stat CT chest.  There was no dissection and the LIMA was found to be very small but patent.  The CT suggested a filling defect within the LA appendage that led to TEE as well as cMRI evaluation.  Ultimately it was felt that the LA appendage thrombus that was actually within the oversewn portion of the LA appendage and is separated from the LA.  He was also found to have disruption of his mitral annuloplasty ring with mild to moderate MR.   (See d/c summary for details).  Before discharge he was encouraged to undergo repeat LHC in order to attempt to evaluate his LIMA; however, he declined.  Now he returns with two severe episodes of chest pain.  One yesterday was fairly self limited.  Today, his pain lasted for a few hours and was associated with SOB.  It was severe and occurred at rest.    Past Medical History  Diagnosis Date  . Atrial fibrillation     a. Paroxysmal, not Coumadin candidate 2/2 Parkinson's/gait instability  . PE (pulmonary embolism)     a. >20 years ago;  b. nl V:Q scan 10/2010  . DVT (deep venous thrombosis)   . Meningioma   . BPH (benign prostatic hyperplasia)   . Personal history of colonic polyps   . Venereal wart   . Depression   . Asthma   . Chickenpox   . Thrombophlebitis   . GERD (gastroesophageal reflux disease)   . Parkinson disease   . Dementia   . CAD (coronary artery disease)     a. 08/2009 s/p CABG x 2 (LIMA->LAD, SVG-OM). b. Adm w/ CP 09/2011 - questionable functionality of LIMA graft but patient  declined further catheterization (cath 10/05/11 -- ostial LAD disease, possible flap in prox L subclavian/unable to image LIMA well, atretic SVG-OM)  . Hyperlipidemia   . Stricture and stenosis of esophagus   . Hiatal hernia   . Diverticulosis of colon (without mention of hemorrhage)   . Mitral regurgitation     a. 08/2009 s/p MV repair 22mm Edwards ring and oversewing of LA appendage @ time of CABG;  b. cardiac MRI 09/2011 showing some disruption of lateral annuloplasty ring with "perivalvular" MR and some central MR   . Thrombus of left atrial appendage     a. Abnormal dye staining on cath 09/2011 - imaged by CT/MRI/TEE - felt to be surgically isolated from the LA  . Anginal pain   . Anxiety   . Shortness of breath   . Pneumonia   . Heart murmur   . Arthritis     Past Surgical History  Procedure Date  . Inguinal hernia repair   . Tonsilectomy, adenoidectomy, bilateral myringotomy and tubes   . Varicose vein surgery     left leg  . Back surgery     herniated disc & remove bone spurs  . Coronary artery bypass graft   . Mitral valve repair   . Umbilical hernia repair   .  External ear surgery   . Tee without cardioversion 10/06/2011    Procedure: TRANSESOPHAGEAL ECHOCARDIOGRAM (TEE);  Surgeon: Vesta Mixer, MD;  Location: [facility] ENDOSCOPY;  Service: Cardiovascular;  Laterality: N/A;  . Cardiac catheterization     Family History  Problem Relation Age of Onset  . Arthritis    . Diabetes    . Stroke    . Heart disease Mother     Social History:  reports that he has quit smoking. He has never used smokeless tobacco. He reports that he does not drink alcohol or use illicit drugs.  Allergies:  Allergies  Allergen Reactions  . Ibuprofen     Stomach hurts    Current Facility-Administered Medications  Medication Dose Route Frequency Provider Last Rate Last Dose  . acetaminophen (TYLENOL) tablet 650 mg  650 mg Oral Q4H PRN Meridee Score, MD      . amiodarone (PACERONE) tablet 200  mg  200 mg Oral Daily Meridee Score, MD      . aspirin EC tablet 325 mg  325 mg Oral Daily Meridee Score, MD      . carbidopa-levodopa (SINEMET IR) 25-100 MG per tablet immediate release 1 tablet  1 tablet Oral TID Meridee Score, MD      . clonazePAM [facility]) tablet 1 mg  1 mg Oral Q6H Meridee Score, MD      . clopidogrel (PLAVIX) tablet 600 mg  600 mg Oral Once Meridee Score, MD      . clopidogrel (PLAVIX) tablet 75 mg  75 mg Oral Q breakfast Meridee Score, MD      . heparin ADULT infusion 100 units/mL (25000 units/250 mL)  1,300 Units/hr Intravenous Continuous Christella Hartigan, PHARMD 13 mL/hr at 10/20/11 2141 1,300 Units/hr at 10/20/11 2141  . heparin bolus via infusion 4,000 Units  4,000 Units Intravenous Once Christella Hartigan, PHARMD   4,000 Units at 10/20/11 2148  . nitroGLYCERIN (NITROSTAT) SL tablet 0.4 mg  0.4 mg Sublingual Q5 Min x 3 PRN Meridee Score, MD      . nitroGLYCERIN 0.2 mg/mL in dextrose 5 % infusion  5 mcg/min Intravenous Titrated Gwyneth Sprout, MD   5 mcg/min at 10/20/11 2039  . ondansetron (ZOFRAN) injection 4 mg  4 mg Intravenous Q6H PRN Meridee Score, MD      . pantoprazole (PROTONIX) EC tablet 40 mg  40 mg Oral BID Meridee Score, MD      . polyethylene glycol [facility] / GLYCOLAX) packet 17 g  17 g Oral Daily PRN Meridee Score, MD      . simvastatin (ZOCOR) tablet 20 mg  20 mg Oral q1800 Meridee Score, MD      . Tamsulosin HCl [facility]) capsule 0.4 mg  0.4 mg Oral Daily Meridee Score, MD        ROS: A full review of systems is obtained and is negative except as noted in the HPI.  Physical Exam: Blood pressure 161/82, pulse 85, temperature 98 F (36.7 C), temperature source Oral, resp. rate 15, height 6\' 3"  (1.905 m), weight 107.2 kg (236 lb 5.3 oz), SpO2 96.00%.  GENERAL: no acute distress.  EYES: Extra ocular movements are intact. There is no lid lag. Sclera is anicteric.  ENT: Oropharynx is clear. Dentition is within normal limits.  NECK: Supple. The  thyroid is not enlarged.  LYMPH: There are no masses or lymphadenopathy present.  HEART: Regular rate and rhythm with 2/6 holosystolic murmur at the apex. Minimal JVD LUNGS: Clear to auscultation There are  no rales, rhonchi, or wheezes.  ABDOMEN: Soft, non-tender, and non-distended with normoactive bowel sounds. There is no hepatosplenomegaly.  EXTREMITIES: No clubbing, cyanosis, or edema.  PULSES:  Femoral pulses were +2 and equal bilaterally. DP/PT pulses were +2 and equal bilaterally.  SKIN: Warm, dry, and intact.  NEUROLOGIC: Tremor present  Results: Results for orders placed during the hospital encounter of 10/20/11 (from the past 24 hour(s))  CBC WITH DIFFERENTIAL     Status: Abnormal   Collection Time   10/20/11  7:23 PM      Component Value Range   WBC 9.0  4.0 - 10.5 K/uL   RBC 4.03 (*) 4.22 - 5.81 MIL/uL   Hemoglobin 10.0 (*) 13.0 - 17.0 g/dL   HCT 16.1 (*) 09.6 - 04.5 %   MCV 81.1  78.0 - 100.0 fL   MCH 24.8 (*) 26.0 - 34.0 pg   MCHC 30.6  30.0 - 36.0 g/dL   RDW 40.9 (*) 81.1 - 91.4 %   Platelets 240  150 - 400 K/uL   Neutrophils Relative 71  43 - 77 %   Neutro Abs 6.4  1.7 - 7.7 K/uL   Lymphocytes Relative 15  12 - 46 %   Lymphs Abs 1.3  0.7 - 4.0 K/uL   Monocytes Relative 9  3 - 12 %   Monocytes Absolute 0.8  0.1 - 1.0 K/uL   Eosinophils Relative 5  0 - 5 %   Eosinophils Absolute 0.5  0.0 - 0.7 K/uL   Basophils Relative 1  0 - 1 %   Basophils Absolute 0.1  0.0 - 0.1 K/uL  COMPREHENSIVE METABOLIC PANEL     Status: Abnormal   Collection Time   10/20/11  7:23 PM      Component Value Range   Sodium 136  135 - 145 mEq/L   Potassium 4.5  3.5 - 5.1 mEq/L   Chloride 103  96 - 112 mEq/L   CO2 22  19 - 32 mEq/L   Glucose, Bld 120 (*) 70 - 99 mg/dL   BUN 16  6 - 23 mg/dL   Creatinine, Ser 7.82 (*) 0.50 - 1.35 mg/dL   Calcium 8.9  8.4 - 95.6 mg/dL   Total Protein 6.7  6.0 - 8.3 g/dL   Albumin 3.5  3.5 - 5.2 g/dL   AST 12  0 - 37 U/L   ALT 5  0 - 53 U/L   Alkaline  Phosphatase 57  39 - 117 U/L   Total Bilirubin 0.3  0.3 - 1.2 mg/dL   GFR calc non Af Amer 45 (*) >90 mL/min   GFR calc Af Amer 52 (*) >90 mL/min  APTT     Status: Normal   Collection Time   10/20/11  7:23 PM      Component Value Range   aPTT 29  24 - 37 seconds  PROTIME-INR     Status: Abnormal   Collection Time   10/20/11  7:23 PM      Component Value Range   Prothrombin Time 15.4 (*) 11.6 - 15.2 seconds   INR 1.19  0.00 - 1.49  TROPONIN I     Status: Normal   Collection Time   10/20/11  7:24 PM      Component Value Range   Troponin I <0.30  <0.30 ng/mL    EKG: NSR without STT changes CXR: small right pleural effusion  Assessment/Plan: 71 yo WM with CAD and prior CABG as above as  well as atrial fibrillation and Parkinson's disease who presents with recurrent unstable angina.  He recently had a LHC but LIMA was unable to be adequately visualized.  Plans were made to repeat LHC if symptoms recurred. 1. Unstable angina: - ASA 325 - heparin ggt - nitro as needed - if enzymes elevate will load with plavix as he is not a surgical candidate - continue statin, bb, etc - tentatively plan for repeat LHC in AM after review of previous films 2. Paroxysmal Atrial fibrillation: not on coumadin due to fall risk - rate controlled 3. Parkinson's:  - continue home meds 4. Code Status: patient with portable DNR; however, after discussion of his clinical situation and the fact that ventricular arrythmias could be reversible in the setting of ACS, he would like to withdraw his DNR in the immediate period.  Will mark chart as Full Code.  [name] 10/20/2011, 11:35 PM

## 2011-10-20 NOTE — Telephone Encounter (Signed)
I spoke with pt and gave the below information. 

## 2011-10-20 NOTE — ED Notes (Signed)
Old and new EKG handed to Dr. Plunkett.  Extra copies of both placed in pt chart 

## 2011-10-20 NOTE — Telephone Encounter (Signed)
New problem:  Patient wife calling, C/O chest pain, Afib since 12:45.  2 nitro was given . Numb all over.  Pressure in chest. Recently d/C from hospital.. Schedule appt on 9/4. In [place].

## 2011-10-20 NOTE — ED Notes (Signed)
Per EMS - pt had CP last night then again today, EMS gave a total of 2 Nitro, 324 ASA, 6 Morphine. Initial CP 10/10, after medications 3/10. Hx of bypass sx. CP non-radiating.

## 2011-10-20 NOTE — ED Notes (Signed)
Pt denies CP, pt c/o of slight chest pressure.

## 2011-10-21 ENCOUNTER — Encounter (HOSPITAL_COMMUNITY)
Admission: EM | Disposition: A | Discharge status: Home / Self Care | Payer: Self-pay | Source: Home / Self Care | Attending: Internal Medicine

## 2011-10-21 ENCOUNTER — Inpatient Hospital Stay (HOSPITAL_COMMUNITY): Payer: Medicare Other

## 2011-10-21 DIAGNOSIS — I2510 Atherosclerotic heart disease of native coronary artery without angina pectoris: Secondary | ICD-10-CM

## 2011-10-21 DIAGNOSIS — I200 Unstable angina: Secondary | ICD-10-CM

## 2011-10-21 HISTORY — PX: LEFT HEART CATHETERIZATION WITH CORONARY/GRAFT ANGIOGRAM: SHX5450

## 2011-10-21 LAB — CBC
MCHC: 30.4 g/dL (ref 30.0–36.0)
Platelets: 223 10*3/uL (ref 150–400)
RDW: 16.8 % — ABNORMAL HIGH (ref 11.5–15.5)
WBC: 7.2 10*3/uL (ref 4.0–10.5)

## 2011-10-21 LAB — BASIC METABOLIC PANEL
Chloride: 108 mEq/L (ref 96–112)
Creatinine, Ser: 1.42 mg/dL — ABNORMAL HIGH (ref 0.50–1.35)
GFR calc Af Amer: 56 mL/min — ABNORMAL LOW (ref >90)
GFR calc non Af Amer: 49 mL/min — ABNORMAL LOW (ref >90)
Potassium: 4.4 mEq/L (ref 3.5–5.1)

## 2011-10-21 LAB — TROPONIN I: Troponin I: <0.3 ng/mL (ref <0.30)

## 2011-10-21 SURGERY — LEFT HEART CATHETERIZATION WITH CORONARY/GRAFT ANGIOGRAM

## 2011-10-21 MED ORDER — DIAZEPAM 2 MG PO TABS
2.0000 mg | ORAL_TABLET | ORAL | Status: AC
  Administered 2011-10-21: 2 mg via ORAL
  Filled 2011-10-21: qty 1

## 2011-10-21 MED ORDER — MORPHINE SULFATE 2 MG/ML IJ SOLN: 2.0000 mg | INTRAMUSCULAR | Status: DC | PRN

## 2011-10-21 MED ORDER — ACETAMINOPHEN 325 MG PO TABS: 650.0000 mg | ORAL_TABLET | ORAL | Status: DC | PRN

## 2011-10-21 MED ORDER — ASPIRIN 81 MG PO CHEW
324.0000 mg | CHEWABLE_TABLET | ORAL | Status: AC
Start: 2011-10-21 — End: 2011-10-21
  Administered 2011-10-21: 324 mg via ORAL
  Filled 2011-10-21: qty 4

## 2011-10-21 MED ORDER — BIVALIRUDIN 250 MG IV SOLR
INTRAVENOUS | Status: AC
  Filled 2011-10-21: qty 250

## 2011-10-21 MED ORDER — SODIUM CHLORIDE 0.9 % IJ SOLN: 3.0000 mL | INTRAMUSCULAR | Status: DC | PRN

## 2011-10-21 MED ORDER — LIDOCAINE HCL (PF) 1 % IJ SOLN
INTRAMUSCULAR | Status: AC
  Filled 2011-10-21: qty 30

## 2011-10-21 MED ORDER — SODIUM CHLORIDE 0.9 % IJ SOLN: 3.0000 mL | Freq: Two times a day (BID) | INTRAMUSCULAR | Status: DC

## 2011-10-21 MED ORDER — NITROGLYCERIN 0.2 MG/ML ON CALL CATH LAB
INTRAVENOUS | Status: AC
  Filled 2011-10-21: qty 1

## 2011-10-21 MED ORDER — HEPARIN (PORCINE) IN NACL 2-0.9 UNIT/ML-% IJ SOLN
INTRAMUSCULAR | Status: AC
  Filled 2011-10-21: qty 2000

## 2011-10-21 MED ORDER — SODIUM CHLORIDE 0.9 % IV SOLN
1.0000 mL/kg/h | INTRAVENOUS | Status: AC
  Administered 2011-10-21: 1 mL/kg/h via INTRAVENOUS

## 2011-10-21 MED ORDER — MIDAZOLAM HCL 2 MG/2ML IJ SOLN
INTRAMUSCULAR | Status: AC
  Filled 2011-10-21: qty 2

## 2011-10-21 MED ORDER — SODIUM CHLORIDE 0.9 % IV SOLN: 250.0000 mL | INTRAVENOUS | Status: DC

## 2011-10-21 MED ORDER — ONDANSETRON HCL 4 MG/2ML IJ SOLN: 4.0000 mg | Freq: Four times a day (QID) | INTRAMUSCULAR | Status: DC | PRN

## 2011-10-21 MED ORDER — FENTANYL CITRATE 0.05 MG/ML IJ SOLN
INTRAMUSCULAR | Status: AC
  Filled 2011-10-21: qty 2

## 2011-10-21 MED ORDER — SODIUM CHLORIDE 0.9 % IV SOLN: 250.0000 mL | INTRAVENOUS | Status: DC | PRN

## 2011-10-21 MED ORDER — SODIUM CHLORIDE 0.9 % IJ SOLN
3.0000 mL | Freq: Two times a day (BID) | INTRAMUSCULAR | Status: DC
  Administered 2011-10-22: 3 mL via INTRAVENOUS

## 2011-10-21 NOTE — Progress Notes (Signed)
ANTICOAGULATION CONSULT NOTE - Follow up Consult  Pharmacy Consult for Heparin Indication: chest pain/ACS  Allergies  Allergen Reactions  . Ibuprofen     Stomach hurts    Patient Measurements: Height: 6\' 3"  (190.5 cm) Weight: 212 lb 15.4 oz (96.6 kg) IBW/kg (Calculated) : 84.5  Heparin Dosing Weight: 106 Kg  Vital Signs: Temp: 97.7 F (36.5 C) (08/29 1200) Temp src: Oral (08/29 1200) BP: 127/77 mmHg (08/29 1200) Pulse Rate: 75  (08/29 1200)  Labs:  Basename 10/21/11 1018 10/21/11 0650 10/21/11 0030 10/20/11 1924 10/20/11 1923  HGB -- 9.2* -- -- 10.0*  HCT -- 30.3* -- -- 32.7*  PLT -- 223 -- -- 240  APTT -- -- -- -- 29  LABPROT -- -- -- -- 15.4*  INR -- -- -- -- 1.19  HEPARINUNFRC -- 0.16* -- -- --  CREATININE 1.42* -- -- -- 1.52*  CKTOTAL -- -- -- -- --  CKMB -- -- -- -- --  TROPONINI -- <0.30 <0.30 <0.30 --    Estimated Creatinine Clearance: 57.9 ml/min (by C-G formula based on Cr of 1.42).   Medical History: Past Medical History  Diagnosis Date  . Atrial fibrillation     a. Paroxysmal, not Coumadin candidate 2/2 Parkinson's/gait instability  . PE (pulmonary embolism)     a. >20 years ago;  b. nl V:Q scan 10/2010  . DVT (deep venous thrombosis)   . Meningioma   . BPH (benign prostatic hyperplasia)   . Personal history of colonic polyps   . Venereal wart   . Depression   . Asthma   . Chickenpox   . Thrombophlebitis   . GERD (gastroesophageal reflux disease)   . Parkinson disease   . Dementia   . CAD (coronary artery disease)     a. 08/2009 s/p CABG x 2 (LIMA->LAD, SVG-OM). b. Adm w/ CP 09/2011 - questionable functionality of LIMA graft but patient declined further catheterization (cath 10/05/11 -- ostial LAD disease, possible flap in prox L subclavian/unable to image LIMA well, atretic SVG-OM)  . Hyperlipidemia   . Stricture and stenosis of esophagus   . Hiatal hernia   . Diverticulosis of colon (without mention of hemorrhage)   . Mitral regurgitation       a. 08/2009 s/p MV repair 22mm Edwards ring and oversewing of LA appendage @ time of CABG;  b. cardiac MRI 09/2011 showing some disruption of lateral annuloplasty ring with "perivalvular" MR and some central MR   . Thrombus of left atrial appendage     a. Abnormal dye staining on cath 09/2011 - imaged by CT/MRI/TEE - felt to be surgically isolated from the LA  . Anginal pain   . Anxiety   . Shortness of breath   . Pneumonia   . Heart murmur   . Arthritis     Assessment: Patient with known CAD admitted with chest pain Tp negative.  Heparin drip 1300 uts/hr HL 0.16 < goal 0.3-0.7.  Slight drop from baseline h/h 9.2/30.3 PLTC stable will watch. No noted bleeding.    Goal of Therapy:  Heparin level 0.3-0.7 units/ml Monitor platelets by anticoagulation protocol: Yes   Plan:  Increase Heparin drip 1450 uts/hr Recheck HL after rate increase unless in cath lab  [employer] Pharm.D. CPP, BCPS Clinical Pharmacist [phone] 10/21/2011 1:17 PM

## 2011-10-21 NOTE — Interval H&P Note (Signed)
History and Physical Interval Note:  10/21/2011 4:09 PM  [name]  has presented today for surgery, with the diagnosis of cp  The various methods of treatment have been discussed with the patient and family. After consideration of risks, benefits and other options for treatment, the patient has consented to  Procedure(s) (LRB): LEFT HEART CATHETERIZATION WITH CORONARY/GRAFT ANGIOGRAM () as a surgical intervention .  [name] history has been reviewed, patient examined, no change in status, stable for surgery.  I have reviewed the patient's chart and labs.  Questions were answered to the patient's satisfaction.     [name]

## 2011-10-21 NOTE — Progress Notes (Signed)
   S: CTSP 2/2 c/o lower abdominal discomfort/burning.  Pt reports that this started about 35 mins ago.  He rates it 8/10 with mild tenderness.  Different from presenting c/p.     O:   Filed Vitals:   10/21/11 0800  BP: 135/80  Pulse: 73  Temp:   Resp: 14   Pleasant, nad, aaox3.  Resting tremor.  Lungs cta, Cor irreg, 2/6 sm @ apex.  abd soft, pt reports mild tenderness upon deep palpation along llq/rlq.  Bs+x4.  A/P: 1. Abd Pain:  C/o 8/10 pain.  No signif exam findings.  Will check KUB.  Last BM yesterday.  LFT's nl on admission.  Prn MSO4.  Nicolasa Ducking, NP

## 2011-10-21 NOTE — Care Management Note (Signed)
    Page 1 of 1   10/21/2011     8:58:22 AM   CARE MANAGEMENT NOTE 10/21/2011  Patient:  [name], [name]   Account Number:  [id number]  Date Initiated:  10/21/2011  Documentation initiated by:  Junius Creamer  Subjective/Objective Assessment:   adm w angina     Action/Plan:   lives w wife, pcp dr Jeannett Senior fry   Anticipated DC Date:     Anticipated DC Plan:        DC Planning Services  CM consult      Choice offered to / List presented to:             Status of service:   Medicare Important Message given?   (If response is "NO", the following Medicare IM given date fields will be blank) Date Medicare IM given:   Date Additional Medicare IM given:    Discharge Disposition:    Per UR Regulation:  Reviewed for med. necessity/level of care/duration of stay  If discussed at Long Length of Stay Meetings, dates discussed:    Comments:  8/29 9am debbie Khaza Blansett rn,bsn [phone]

## 2011-10-21 NOTE — Progress Notes (Addendum)
@   Subjective:  Denies CP or dyspnea   Objective:  Filed Vitals:   10/21/11 0344 10/21/11 0400 10/21/11 0500 10/21/11 0600  BP:  117/73 124/76 114/71  Pulse: 72 73 73 74  Temp: 97.9 F (36.6 C)     TempSrc: Oral     Resp: 16 16 14 12   Height:      Weight:      SpO2: 98% 98% 100% 96%    Intake/Output from previous day:  Intake/Output Summary (Last 24 hours) at 10/21/11 0741 Last data filed at 10/21/11 0700  Gross per 24 hour  Intake    207 ml  Output    600 ml  Net   -393 ml    Physical Exam: Physical exam: Well-developed well-nourished in no acute distress.  Skin is warm and dry.  HEENT is normal.  Neck is supple.  Chest is clear to auscultation with normal expansion.  Cardiovascular exam is regular rate and rhythm.  Abdominal exam nontender or distended. No masses palpated. Extremities show no edema. neuro cw Parkinson's    Lab Results: Basic Metabolic Panel:  [facility] 10/20/11 1923  NA 136  K 4.5  CL 103  CO2 22  GLUCOSE 120*  BUN 16  CREATININE 1.52*  CALCIUM 8.9  MG --  PHOS --   CBC:  Basename 10/20/11 1923  WBC 9.0  NEUTROABS 6.4  HGB 10.0*  HCT 32.7*  MCV 81.1  PLT 240   Cardiac Enzymes:  Basename 10/21/11 0030 10/20/11 1924  CKTOTAL -- --  CKMB -- --  CKMBINDEX -- --  TROPONINI <0.30 <0.30     Assessment/Plan:  1) CP - Chart reviewed; patient presents with recurrent CP; enzymes negative and patient is pain free; recent cath with proximal LAD lesion; LIMA not well visualized but questionably functional; will plan for repeat cath and if LIMA not functional; may need PCI of LAD; however, CR increased; repeat this AM prior to cath. Continue ASA, heparin, NTG and statin. 2) PAF - continue amiodarone; continue ASA; not a coumadin candidate given orthostasis, Parkinson's and history of fall. 3) Parkinsons 4) CAD - continue ASA and statin. 5) H/O MV repair - mild to moderate MR on recent TEE 6) Acute renal insufficiency - repeat BMET  this AM prior to cath; proceed if improved; hydrate; no Vgram.  Olga Millers 10/21/2011, 7:41 AM

## 2011-10-21 NOTE — CV Procedure (Signed)
   Cardiac Catheterization Procedure Note  Name: [name] MRN: [id number] DOB: 1941-01-02  Procedure: Selective coronary angiography, IVUS of the LAD, LIMA angiography, angioseal RFA  Indication: Chest pain, assess left subclavian/LIMA and ostial LAD. By angiography the ostium of the LAD (separate origin) appeared stenotic.   Procedural details: The right groin was prepped, draped, and anesthetized with 1% lidocaine. Using modified Seldinger technique, a 5 French sheath was introduced into the right femoral artery. A 3DRC catheter was used to engage the left subclavian. A wholey wire was used to advance the catheter into the distal left subclavian. This was changed out for a LIMA catheter. Selective LIMA angiography was performed. A pullback gradient across the subclavian was performed.   Attention was then turned to the LAD. The sheath was changed to 6 french. A 6 Fr XBLAD 3.5 cm guide was inserted. Bival was used for anticoagulation. A wire was passed into the LAD. IC NTG was administered. An IVUS cather was advance and mechanical pullback was performed. The patient tolerated the procedure well. The RFA was closed with an angioseal device.  Procedural Findings: Hemodynamics:  AO 148/87 Left Subclavian 143/78   LAD - eccentric-appearing lesion in the ostium of the LAD, with patency of the LAD to the LV apex.  IVUS - Wide patency of the LAD throughout. Minimal plaque and mild calcification of the proximal LAD. No significant stenosis.  LIMA - LAD - patent but atretic  Final Conclusions:   Patent LAD Patent but atretic LIMA-LAD  Recommendations: Medical management. I suspect the LIMA is atretic because there is not flow-obstructive disease in the native LAD.  Tonny Bollman 10/21/2011, 4:13 PM

## 2011-10-21 NOTE — H&P (View-Only) (Signed)
@   Subjective:  Denies CP or dyspnea   Objective:  Filed Vitals:   10/21/11 0344 10/21/11 0400 10/21/11 0500 10/21/11 0600  BP:  117/73 124/76 114/71  Pulse: 72 73 73 74  Temp: 97.9 F (36.6 C)     TempSrc: Oral     Resp: 16 16 14 12  Height:      Weight:      SpO2: 98% 98% 100% 96%    Intake/Output from previous day:  Intake/Output Summary (Last 24 hours) at 10/21/11 0741 Last data filed at 10/21/11 0700  Gross per 24 hour  Intake    207 ml  Output    600 ml  Net   -393 ml    Physical Exam: Physical exam: Well-developed well-nourished in no acute distress.  Skin is warm and dry.  HEENT is normal.  Neck is supple.  Chest is clear to auscultation with normal expansion.  Cardiovascular exam is regular rate and rhythm.  Abdominal exam nontender or distended. No masses palpated. Extremities show no edema. neuro cw Parkinson's    Lab Results: Basic Metabolic Panel:  Basename 10/20/11 1923  NA 136  K 4.5  CL 103  CO2 22  GLUCOSE 120*  BUN 16  CREATININE 1.52*  CALCIUM 8.9  MG --  PHOS --   CBC:  Basename 10/20/11 1923  WBC 9.0  NEUTROABS 6.4  HGB 10.0*  HCT 32.7*  MCV 81.1  PLT 240   Cardiac Enzymes:  Basename 10/21/11 0030 10/20/11 1924  CKTOTAL -- --  CKMB -- --  CKMBINDEX -- --  TROPONINI <0.30 <0.30     Assessment/Plan:  1) CP - Chart reviewed; patient presents with recurrent CP; enzymes negative and patient is pain free; recent cath with proximal LAD lesion; LIMA not well visualized but questionably functional; will plan for repeat cath and if LIMA not functional; may need PCI of LAD; however, CR increased; repeat this AM prior to cath. Continue ASA, heparin, NTG and statin. 2) PAF - continue amiodarone; continue ASA; not a coumadin candidate given orthostasis, Parkinson's and history of fall. 3) Parkinsons 4) CAD - continue ASA and statin. 5) H/O MV repair - mild to moderate MR on recent TEE 6) Acute renal insufficiency - repeat BMET  this AM prior to cath; proceed if improved; hydrate; no Vgram.  Brian Crenshaw 10/21/2011, 7:41 AM        

## 2011-10-22 DIAGNOSIS — I2510 Atherosclerotic heart disease of native coronary artery without angina pectoris: Secondary | ICD-10-CM

## 2011-10-22 DIAGNOSIS — I209 Angina pectoris, unspecified: Secondary | ICD-10-CM | POA: Diagnosis present

## 2011-10-22 LAB — BASIC METABOLIC PANEL
CO2: 23 mEq/L (ref 19–32)
Calcium: 8.9 mg/dL (ref 8.4–10.5)
Creatinine, Ser: 1.45 mg/dL — ABNORMAL HIGH (ref 0.50–1.35)
Glucose, Bld: 95 mg/dL (ref 70–99)

## 2011-10-22 MED ORDER — ASPIRIN EC 325 MG PO TBEC: 325.0000 mg | DELAYED_RELEASE_TABLET | Freq: Every day | ORAL | Status: DC

## 2011-10-22 MED ORDER — POLYETHYLENE GLYCOL 3350 17 G PO PACK: 17.0000 g | PACK | Freq: Every day | ORAL | Status: DC

## 2011-10-22 MED FILL — Dextrose Inj 5%: INTRAVENOUS | Qty: 50 | Status: AC

## 2011-10-22 NOTE — Progress Notes (Signed)
Patient Name: [name] Date of Encounter: 10/22/2011 Principal Problem:  *Anginal pain Active Problems:  HYPERTENSION  CAD  SUBJECTIVE: Had chest pain earlier - pt describes feeling his heart "pounding" but denies other types of chest pain today. Wife is concerned that he seems to have more pain (talking to her) than he admits to having (talking with staff). Also had occ numbness in hands, not associated with exertion.  OBJECTIVE Filed Vitals:   10/22/11 1000 10/22/11 1100 10/22/11 1132 10/22/11 1200  BP:   129/76   Pulse: 76 75 75 73  Temp:   97.8 F (36.6 C)   TempSrc:   Oral   Resp: 14 17  20   Height:      Weight:      SpO2: 100% 99% 99% 99%    Intake/Output Summary (Last 24 hours) at 10/22/11 1232 Last data filed at 10/22/11 1200  Gross per 24 hour  Intake 1577.79 ml  Output   1625 ml  Net -47.21 ml   Filed Weights   10/20/11 2100 10/20/11 2255 10/21/11 0800  Weight: 235 lb (106.595 kg) 236 lb 5.3 oz (107.2 kg) 212 lb 15.4 oz (96.6 kg)   PHYSICAL EXAM General: Well developed, well nourished, male in no acute distress. Head: Normocephalic, atraumatic.  Neck: Supple without bruits, JVD not elevated, diff to assess secondary to habitus. Lungs:  Resp regular and unlabored, decreased breath sounds bases, no crackles. Heart: RRR, S1, S2, no S3, S4, 2/6 murmur. Abdomen: Soft, non-tender, non-distended, BS + x 4.  Extremities: No clubbing, cyanosis, no edema.  Neuro: Alert and oriented X 3. Moves all extremities spontaneously. Psych: Normal affect.  LABS: CBC: Basename 10/21/11 0650 10/20/11 1923  WBC 7.2 9.0  NEUTROABS -- 6.4  HGB 9.2* 10.0*  HCT 30.3* 32.7*  MCV 80.4 81.1  PLT 223 240   INR: Basename 10/20/11 1923  INR 1.19   Basic Metabolic Panel: Basename 10/22/11 0500 10/21/11 1018  NA 140 141  K 4.0 4.4  CL 105 108  CO2 23 24  GLUCOSE 95 95  BUN 11 13  CREATININE 1.45* 1.42*  CALCIUM 8.9 8.7  MG -- --  PHOS -- --   Liver Function  Tests: [facility] 10/20/11 1923  AST 12  ALT 5  ALKPHOS 57  BILITOT 0.3  PROT 6.7  ALBUMIN 3.5   Cardiac Enzymes: Basename 10/21/11 0650 10/21/11 0030 10/20/11 1924  CKTOTAL -- -- --  CKMB -- -- --  CKMBINDEX -- -- --  TROPONINI <0.30 <0.30 <0.30   TELE: SR, no significant ectopy    ECG: SR, no acute ischemic changes.  Radiology/Studies: Dg Chest Port 1 View 10/20/2011  *RADIOLOGY REPORT*  Clinical Data: Chest pain.  PORTABLE CHEST - 1 VIEW  Comparison: CT chest 07/11/2011 and plain film chest 10/03/2012.  Findings: The patient is status post CABG.  There is marked cardiomegaly.  Small bilateral pleural effusions and basilar atelectasis appear greater on the right.  No pulmonary edema.  IMPRESSION:  1.  Small bilateral pleural effusions and basilar atelectasis, greater on the right. 2.  Cardiomegaly without edema.   Original Report Authenticated By: Bernadene Bell. Maricela Curet, M.D.    Dg Abd Portable 1v 10/21/2011  *RADIOLOGY REPORT*  Clinical Data: Mid/lower abdominal discomfort  PORTABLE ABDOMEN - 1 VIEW  Comparison: CT abdomen pelvis dated 10/05/2011  Findings: Nonobstructive bowel gas pattern.  Moderate stool in the right colon.  Mild degenerative changes of the visualized thoracolumbar spine.  IMPRESSION: No evidence of  bowel obstruction.  Moderate stool in the right colon.   Original Report Authenticated By: Charline Bills, M.D.     Current Medications:     . amiodarone  200 mg Oral Daily  . aspirin  324 mg Oral Pre-Cath  . aspirin EC  325 mg Oral Daily  . bivalirudin      . carbidopa-levodopa  1 tablet Oral TID  . clonazePAM  1 mg Oral Q6H  . diazepam  2 mg Oral On Call  . fentaNYL      . heparin      . lidocaine      . midazolam      . nitroGLYCERIN      . pantoprazole  40 mg Oral BID  . simvastatin  20 mg Oral q1800  . sodium chloride  3 mL Intravenous Q12H  . Tamsulosin HCl  0.4 mg Oral Daily  . DISCONTD: sodium chloride  3 mL Intravenous Q12H      . sodium  chloride 10 mL (10/21/11 0025)  . sodium chloride 1 mL/kg/hr (10/21/11 2234)  . sodium chloride Stopped (10/22/11 0800)  . nitroGLYCERIN Stopped (10/20/11 2127)  . DISCONTD: heparin 1,450 Units/hr (10/21/11 1407)    ASSESSMENT AND PLAN: Principal Problem:  *Anginal pain - S/P cath yest to assess LAD/LIMA:  Patent LAD  Patent but atretic LIMA-LAD  Recommendations: Medical management. I suspect the LIMA is atretic because there is not flow-obstructive disease in the native LAD.  Pt had some chest pain today, unclear if true angina or not. Pt has hx orthostatic hypotension per history list, MD advise on adding nitrates. Continue ASA. F/U in office.   Abdominal pain - stool in colon, no other acute abnormality on Xray. Pt feels better now, but has not had BM today. He needs to have bowel regimen in place so he does not strain. F/U with primary MD +/- GI if further eval needed.   Hand numbness - unclear if cardiac-related. Would have pt f/u with primary MD to see if further workup indicated.   HYPERTENSION - OK on current Rx   CAD - see above  Plan - ambulate (will need walker) and d/c home later today if does well. Keep f/u appt next week with [facility] in [place].  Signed, Theodore Demark , PA-C 12:32 PM 10/22/2011   History and all data above reviewed.  Patient examined.  I agree with the findings as above.  No further chest pain this am.  The patient exam reveals COR:RRR, soft apical murmur  ,  Lungs: Clear  ,  Abd: Positive bowel sounds, no rebound no guarding, Ext Right groin without drainage or bruit  .  All available labs, radiology testing, previous records reviewed. Agree with documented assessment and plan. OK to discharge today after he ambulates.  I would not add a nitrate at this point.  Fayrene Fearing Norman Bier  2:34 PM  10/22/2011

## 2011-10-22 NOTE — Discharge Summary (Signed)
CARDIOLOGY DISCHARGE SUMMARY   Patient ID: XIONG HAIDAR MRN: [id number] DOB/AGE: [birth year] 70 y.o.  Admit date: 10/20/2011 Discharge date: 10/22/2011  Primary Discharge Diagnosis:  Anginal chest pain Secondary Discharge Diagnosis:  Past Medical History  Diagnosis Date  . Atrial fibrillation     a. Paroxysmal, not Coumadin candidate 2/2 Parkinson's/gait instability  . PE (pulmonary embolism)     a. >20 years ago;  b. nl V:Q scan 10/2010  . DVT (deep venous thrombosis)   . Meningioma   . BPH (benign prostatic hyperplasia)   . Personal history of colonic polyps   . Venereal wart   . Depression   . Asthma   . Chickenpox   . Thrombophlebitis   . GERD (gastroesophageal reflux disease)   . Parkinson disease   . Dementia   . CAD (coronary artery disease)     a. 08/2009 s/p CABG x 2 (LIMA->LAD, SVG-OM). b. Adm w/ CP 09/2011 - questionable functionality of LIMA graft but patient declined further catheterization (cath 10/05/11 -- ostial LAD disease, possible flap in prox L subclavian/unable to image LIMA well, atretic SVG-OM)  . Hyperlipidemia   . Stricture and stenosis of esophagus   . Hiatal hernia   . Diverticulosis of colon (without mention of hemorrhage)   . Mitral regurgitation     a. 08/2009 s/p MV repair 22mm Edwards ring and oversewing of LA appendage @ time of CABG;  b. cardiac MRI 09/2011 showing some disruption of lateral annuloplasty ring with "perivalvular" MR and some central MR   . Thrombus of left atrial appendage     a. Abnormal dye staining on cath 09/2011 - imaged by CT/MRI/TEE - felt to be surgically isolated from the LA  . Anginal pain   . Anxiety   . Shortness of breath   . Pneumonia   . Heart murmur   . Arthritis    Procedures:Selective coronary angiography, IVUS of the LAD, LIMA angiography, angioseal RFA   Hospital Course: Mr Eichenberger is a 71 year old male with a history of CAD. He had recurrent chest pain and came to the hospital where he was admitted for  further evaluation and treatment.   His troponins were negative for MI. He had a recent cath with difficulty seeing the LAD/LIMA so a procedure was scheduled to do this. A heart cath performed on 8/29 did not show a critical lesion, and medical therapy was recommended. Full results are below. He had some recurrent chest pain, but it resolved without intervention. He is encouraged to report symptoms, but no med changes are made at this time.   He had some abdominal pain and an X-ray was performed, but no critical problem was seen. He is encouraged to avoid constipation and follow up with primary care as needed. He has a history of PAF, but is not a coumadin candidate. His aspirin was increased to 325 mg and he is to report problems or concerns.  On 10/22/2011, Mr Lindaman was ambulated. He tolerated this well and his other medical problems are at baseline. He was evaluated by Dr Antoine Poche and considered stable for discharge, to follow up as an outpatient.   Labs:  Lab Results  Component Value Date   WBC 7.2 10/21/2011   HGB 9.2* 10/21/2011   HCT 30.3* 10/21/2011   MCV 80.4 10/21/2011   PLT 223 10/21/2011    Lab 10/22/11 0500 10/20/11 1923  NA 140 --  K 4.0 --  CL 105 --  CO2 23 --  BUN 11 --  CREATININE 1.45* --  CALCIUM 8.9 --  PROT -- 6.7  BILITOT -- 0.3  ALKPHOS -- 57  ALT -- 5  AST -- 12  GLUCOSE 95 --    Basename 10/21/11 0650 10/21/11 0030 10/20/11 1924  CKTOTAL -- -- --  CKMB -- -- --  CKMBINDEX -- -- --  TROPONINI <0.30 <0.30 <0.30     Basename 10/20/11 1923  INR 1.19      Radiology: Mr Maxine Glenn Chest W Contrast 10/07/2011  Cardiac MRI:  Indication: Cardiac "Mass" Assess LA appendage  Comparison: Reviewed Cath 10/04/11, TTE 10/05/11, TEE 10/06/11 and CT done 10/04/11  Protocol:  The patient was scanned on a 1.5 Tesla GE magnet.  A dedicated cardiac coil was used.  Functional imaging was done using Fiesta sequences.  The LAA was visualized in the lateral, axial and basal short axis  views.  The patient received 35 cc of Multihance. After 10 minutes inversion recovery sequences were done with long TI to assess for thrombus.  Quantitative EF was calculated from the cine short axis stack using modified Simpson's method on a dedicated Circle work station.  Findings:  There were bilateral pleural effusions right greater than left. There was no pericardial effusion.  There was moderate LAE.  The RA/RV were normal.  The LV was normal with no discrete RWMA.  EF was 68% ( EDV 121cc, ESV 38cc, SV 83 cc)  The LAA was oversewn with no flow from LA.  It was enlarged measuring 4.9 cm in the AP direction and 2.1 cm in the cranial caudal direction from saggital slices.  It was filled with thrombus.  No flow could be seen within the thrombus.  There was no other pericardiac mass or hematoma.  The patient is S/P mitral valve repair.  There appears to be some disruption of the lateral annuloplasty ring with perivalvular MR as well as an area of central MR through the repair.  The LA itself is moderately enlarged with no thrombus or spontaneous contrast seen.  The AV is trileaflet and normal.  The PV is normal.  There is moderate dilatation of the MPA measuring 3.5 cm.  The TV is normal.  TRICKS imaging had poor spatial and temporal resolution and could not identify fistulous flow.  3D reconstruction of the ascending aorta showed no dissection with a widely patent but tortuous left subclavian artery.  Impression:     1)    Large LAA filled with thrombus but isolated from LA.  No       other pericardiac mass measures 4.9cm by 2.1 cm Moderate LA       enlargement        2)    Normal LV no RWMA;s EF 68% 3)    No aortic dissection ascending aorta 3.3 cm  with widely patent left subclavian artery 4)    S/P mitral valve repair with annuloplasty ring.  Some disruption of lateral annuloplasty ring with "perivalvular" MR and some central MR 5)    Bilateral pleural effusions 6)    Moderately dilated MPA 3.5 cm  Charlton Haws  MD [facility]  Original Report Authenticated By: Darcus Austin Chest Port 1 View 10/20/2011  *RADIOLOGY REPORT*  Clinical Data: Chest pain.  PORTABLE CHEST - 1 VIEW  Comparison: CT chest 07/11/2011 and plain film chest 10/03/2012.  Findings: The patient is status post CABG.  There is marked cardiomegaly.  Small bilateral pleural effusions and basilar atelectasis appear greater on the  right.  No pulmonary edema.  IMPRESSION:  1.  Small bilateral pleural effusions and basilar atelectasis, greater on the right. 2.  Cardiomegaly without edema.   Original Report Authenticated By: Bernadene Bell. Maricela Curet, M.D.    Dg Abd Portable 1v 10/21/2011  *RADIOLOGY REPORT*  Clinical Data: Mid/lower abdominal discomfort  PORTABLE ABDOMEN - 1 VIEW  Comparison: CT abdomen pelvis dated 10/05/2011  Findings: Nonobstructive bowel gas pattern.  Moderate stool in the right colon.  Mild degenerative changes of the visualized thoracolumbar spine.  IMPRESSION: No evidence of bowel obstruction.  Moderate stool in the right colon.   Original Report Authenticated By: Charline Bills, M.D.     Cardiac Cath: 10/21/2011 LAD - eccentric-appearing lesion in the ostium of the LAD, with patency of the LAD to the LV apex.  IVUS - Wide patency of the LAD throughout. Minimal plaque and mild calcification of the proximal LAD. No significant stenosis.  LIMA - LAD - patent but atretic  Final Conclusions:  Patent LAD  Patent but atretic LIMA-LAD  Recommendations: Medical management. I suspect the LIMA is atretic because there is not flow-obstructive disease in the native LAD.  EKG: 22-Oct-2011 12:53:45  Normal sinus rhythm Incomplete left bundle branch block Borderline ECG 68mm/s 37mm/mV 100Hz  8.0.1 12SL 241 HD CID: 0 Referred by: Unconfirmed Vent. rate 77 BPM PR interval 206 ms QRS duration 120 ms QT/QTc 436/493 ms P-R-T axes 36 2 56  FOLLOW UP PLANS AND APPOINTMENTS Allergies  Allergen Reactions  . Ibuprofen Other (See Comments)     Stomach hurts   Medication List  As of 10/22/2011  4:21 PM   TAKE these medications         amiodarone 200 MG tablet   Commonly known as: PACERONE   Take 1 tablet (200 mg total) by mouth daily.      aspirin EC 325 MG tablet   Take 1 tablet (325 mg total) by mouth daily.      carbidopa-levodopa 25-100 MG per tablet   Commonly known as: SINEMET IR   Take 1 tablet by mouth 3 (three) times daily. 1 tablet at breakfast and lunch      clonazePAM 1 MG tablet   Commonly known as: KLONOPIN   Take 1 mg by mouth every 6 (six) hours.      desvenlafaxine 50 MG 24 hr tablet   Commonly known as: PRISTIQ   Take 50 mg by mouth at bedtime.      hydrocortisone cream 1 %   Apply 1 application topically 2 (two) times daily.      multivitamin with minerals Tabs   Take 1 tablet by mouth daily.      nitroGLYCERIN 0.4 MG SL tablet   Commonly known as: NITROSTAT   Place 0.4 mg under the tongue every 5 (five) minutes as needed. For chest pain      pantoprazole 40 MG tablet   Commonly known as: PROTONIX   Take 40 mg by mouth 2 (two) times daily.      polyethylene glycol packet   Commonly known as: MIRALAX / GLYCOLAX   Take 17 g by mouth daily. Can use BID PRN for constipation      pravastatin 40 MG tablet   Commonly known as: PRAVACHOL   Take 40 mg by mouth daily.      promethazine 25 MG tablet   Commonly known as: PHENERGAN   Take 25 mg by mouth every 6 (six) hours as needed. For nausea  Tamsulosin HCl 0.4 MG Caps   Commonly known as: FLOMAX   Take 0.4 mg by mouth daily.            Discharge Orders    Future Appointments: Provider: Department: Dept Phone: Center:   10/27/2011 2:00 PM Lewayne Bunting, MD [facility] [phone] [id number]   01/05/2012 2:00 PM Lewayne Bunting, MD [facility] [phone] [id number]      BRING ALL MEDICATIONS WITH YOU TO FOLLOW UP APPOINTMENTS  Time spent with patient to include physician time: 42 min Signed: Theodore Demark 10/22/2011, 12:40 PM Co-Sign MD  Patient seen and examined.  Plan as discussed in my rounding note for today and outlined above. Rollene Rotunda  10/22/2011  4:59 PM

## 2011-10-22 NOTE — Progress Notes (Signed)
At 1245 wife relayed that patient was experiencing chest pain, but did not want staff to know as he wants to be discharged.  Stat EKG done and R. Barrett, PA notified and at bedside to assess patient.  Patient refused nitroglycerine.

## 2011-10-26 ENCOUNTER — Telehealth: Payer: Self-pay | Admitting: Cardiology

## 2011-10-26 NOTE — Telephone Encounter (Signed)
Received call from patient's wife stating patient is having a # 10 chest pressure,numbness all over,unable to walk.States she has gave patient 2 NTG with no relief.Wife advised to take patient to [facility] ER.States she will call [phone] since patient unable to walk.Rosann Auerbach was called.

## 2011-10-26 NOTE — Telephone Encounter (Signed)
Pt is having pressure in his chest,numbness all over his body, pt has taken 2 nitro and can not walk

## 2011-10-27 ENCOUNTER — Ambulatory Visit (INDEPENDENT_AMBULATORY_CARE_PROVIDER_SITE_OTHER): Payer: Medicare Other | Admitting: Cardiology

## 2011-10-27 ENCOUNTER — Telehealth: Payer: Self-pay | Admitting: Family Medicine

## 2011-10-27 ENCOUNTER — Encounter: Payer: Self-pay | Admitting: Cardiology

## 2011-10-27 VITALS — BP 90/59 | HR 76 | Wt 232.0 lb

## 2011-10-27 DIAGNOSIS — I2510 Atherosclerotic heart disease of native coronary artery without angina pectoris: Secondary | ICD-10-CM

## 2011-10-27 DIAGNOSIS — I209 Angina pectoris, unspecified: Secondary | ICD-10-CM

## 2011-10-27 DIAGNOSIS — I951 Orthostatic hypotension: Secondary | ICD-10-CM

## 2011-10-27 DIAGNOSIS — I4891 Unspecified atrial fibrillation: Secondary | ICD-10-CM

## 2011-10-27 DIAGNOSIS — Z9889 Other specified postprocedural states: Secondary | ICD-10-CM

## 2011-10-27 DIAGNOSIS — E785 Hyperlipidemia, unspecified: Secondary | ICD-10-CM

## 2011-10-27 NOTE — Assessment & Plan Note (Signed)
Patient remains in sinus rhythm. Continue amiodarone. Check TSH. Recent liver functions normal. Chest x-ray unremarkable. Continue aspirin. Not a Coumadin candidate given history of orthostasis, Parkinson's and recurrent falls.

## 2011-10-27 NOTE — Patient Instructions (Addendum)
Your physician recommends that you schedule a follow-up appointment in: 3 MONTHS WITH DR Jens Som IN [place]  Your physician recommends that you HAVE LAB WORK TODAY

## 2011-10-27 NOTE — Assessment & Plan Note (Signed)
Continue aspirin and statin. 

## 2011-10-27 NOTE — Telephone Encounter (Signed)
[name] spouse calling about Sleep issues. Recent Afib diagnosis, visit with cardiologist 10/27/11-no heart concerns at this time. Hospitalized 8/12-8/16,8/27-8/30 for chest pain. Patient requesting medication to help him sleep. Taking Pristiq x 6 weeks. Walking well. Feeling more controlled with depression and heart rate. 2 episodes of chest tightness over 2 days, takes 2hours 9/2, 45 mins 10/26/11 for Nitrostat to help. Per Bjorn Loser doing well 10/27/11 with health concerns, just agitated with sleep deprivation. Care advice given per Sleep Disorder protocol. CAN/ MKingRN

## 2011-10-27 NOTE — Assessment & Plan Note (Signed)
Continue SBE prophylaxis.TEE showed mild to moderate mitral regurgitation.

## 2011-10-27 NOTE — Progress Notes (Signed)
HPI: Pleasant male with past medical history of coronary artery disease and atrial fibrillation for followup. On July 12 of 2011 the patient underwent two-vessel coronary artery bypass grafting (left internal mammary artery to LAD, saphenous vein graft to obtuse marginal) and MV repair with a 22-mm Edwards ring and oversewing of left atrial appendage. Patient also with orthostatic hypotension. Carotid Dopplers in April of 2013 showed 0-39% bilateral stenosis and followup recommended in one year. Patient admitted in August of 2013 with chest pain. Patient ruled out for myocardial infarction with serial enzymes. Cardiac catheterization on 10/05/2011 showed high-grade LAD disease but no significant disease in the RCA or LCX, atretic saphenous vein graft to OM, possible flap in the proximal left subclavian and the LIMA was not visualized. There was concern for aortic dissection. A CTA however showed no evidence of this. TEE showed normal LV function, a thrombosed left atrial appendage, prior mitral valve repair with mild to moderate mitral regurgitation. Cardiac MRI showed a large left atrial appendage filled with thrombus. Ejection fraction was 68%. Moderate left atrial enlargement. Status post mitral valve repair with annuloplasty ring with some disruption of lateral ring with perivalvular MR. Patient readmitted with chest pain and had repeat cardiac catheterization as at time of previous cath LIMA was not well visualized. There was an eccentric appearing lesion in the ostium of the LAD but not obstructive. IVUS showed minimal plaque and mild calcification of the proximal LAD and no significant stenosis.The LIMA was atretic. Medical therapy recommended. Since then, he has had 2 episodes of chest pain. These are epigastric to substernal in location. No radiation. No associated symptoms. The pain is not pleuritic, positional or exertional. They can increase after eating.  Current Outpatient Prescriptions    Medication Sig Dispense Refill  . amiodarone (PACERONE) 200 MG tablet Take 1 tablet (200 mg total) by mouth daily.      Marland Kitchen aspirin EC 325 MG tablet Take 1 tablet (325 mg total) by mouth daily.      . carbidopa-levodopa (SINEMET) 25-100 MG per tablet Take 1 tablet by mouth 3 (three) times daily. 1 tablet at breakfast and lunch      . clonazePAM (KLONOPIN) 1 MG tablet Take 1 mg by mouth every 6 (six) hours.      Marland Kitchen desvenlafaxine (PRISTIQ) 50 MG 24 hr tablet Take 50 mg by mouth at bedtime.      . hydrocortisone cream 1 % Apply 1 application topically 2 (two) times daily.      . Multiple Vitamin (MULITIVITAMIN WITH MINERALS) TABS Take 1 tablet by mouth daily.      . nitroGLYCERIN (NITROSTAT) 0.4 MG SL tablet Place 0.4 mg under the tongue every 5 (five) minutes as needed. For chest pain      . pantoprazole (PROTONIX) 40 MG tablet Take 40 mg by mouth 2 (two) times daily.      . polyethylene glycol (MIRALAX / GLYCOLAX) packet Take 17 g by mouth daily. Can use BID PRN for constipation  30 each  3  . pravastatin (PRAVACHOL) 40 MG tablet Take 40 mg by mouth daily.      . promethazine (PHENERGAN) 25 MG tablet Take 25 mg by mouth every 6 (six) hours as needed. For nausea      . Tamsulosin HCl (FLOMAX) 0.4 MG CAPS Take 0.4 mg by mouth daily.      Marland Kitchen DISCONTD: desvenlafaxine (PRISTIQ) 50 MG 24 hr tablet Take 1 tablet (50 mg total) by mouth daily.  60 tablet  0     Past Medical History  Diagnosis Date  . Atrial fibrillation     a. Paroxysmal, not Coumadin candidate 2/2 Parkinson's/gait instability  . PE (pulmonary embolism)     a. >20 years ago;  b. nl V:Q scan 10/2010  . DVT (deep venous thrombosis)   . Meningioma   . BPH (benign prostatic hyperplasia)   . Personal history of colonic polyps   . Venereal wart   . Depression   . Asthma   . Chickenpox   . Thrombophlebitis   . GERD (gastroesophageal reflux disease)   . Parkinson disease   . Dementia   . CAD (coronary artery disease)     a. 08/2009  s/p CABG x 2 (LIMA->LAD, SVG-OM). b. Adm w/ CP 09/2011 - questionable functionality of LIMA graft but patient declined further catheterization (cath 10/05/11 -- ostial LAD disease, possible flap in prox L subclavian/unable to image LIMA well, atretic SVG-OM)  . Hyperlipidemia   . Stricture and stenosis of esophagus   . Hiatal hernia   . Diverticulosis of colon (without mention of hemorrhage)   . Mitral regurgitation     a. 08/2009 s/p MV repair 22mm Edwards ring and oversewing of LA appendage @ time of CABG;  b. cardiac MRI 09/2011 showing some disruption of lateral annuloplasty ring with "perivalvular" MR and some central MR   . Thrombus of left atrial appendage     a. Abnormal dye staining on cath 09/2011 - imaged by CT/MRI/TEE - felt to be surgically isolated from the LA  . Anginal pain   . Anxiety   . Shortness of breath   . Pneumonia   . Heart murmur   . Arthritis     Past Surgical History  Procedure Date  . Inguinal hernia repair   . Tonsilectomy, adenoidectomy, bilateral myringotomy and tubes   . Varicose vein surgery     left leg  . Back surgery     herniated disc & remove bone spurs  . Coronary artery bypass graft   . Mitral valve repair   . Umbilical hernia repair   . External ear surgery   . Tee without cardioversion 10/06/2011    Procedure: TRANSESOPHAGEAL ECHOCARDIOGRAM (TEE);  Surgeon: Vesta Mixer, MD;  Location: [facility] ENDOSCOPY;  Service: Cardiovascular;  Laterality: N/A;  . Cardiac catheterization     History   Social History  . Marital Status: Married    Spouse Name: N/A    Number of Children: 2  . Years of Education: N/A   Occupational History  .      Retired   Social History Main Topics  . Smoking status: Former Games developer  . Smokeless tobacco: Never Used  . Alcohol Use: No  . Drug Use: No  . Sexually Active: No   Other Topics Concern  . Not on file   Social History Narrative  . No narrative on file    ROS: no fevers or chills, productive cough,  hemoptysis, dysphasia, odynophagia, melena, hematochezia, dysuria, hematuria, rash, seizure activity, orthopnea, PND, pedal edema, claudication. Remaining systems are negative.  Physical Exam: Well-developed well-nourished in no acute distress.  Skin is warm and dry.  HEENT is normal.  Neck is supple.  Chest is clear to auscultation with normal expansion.  Cardiovascular exam is regular rate and rhythm. 2/6 systolic murmur apex. Abdominal exam nontender or distended. No masses palpated. Extremities show no edema. neuro consistent with Parkinson's  ECG sinus rhythm at a rate of 76. Occasional PVC.  No ST change. Prolonged QT.

## 2011-10-27 NOTE — Assessment & Plan Note (Signed)
Continue statin. 

## 2011-10-27 NOTE — Assessment & Plan Note (Addendum)
Patient continues to have occasional chest pain. He has had an extensive workup including chest CT that was negative for dissection, echocardiogram that showed normal LV function, and cardiac catheterization that did not demonstrate significant coronary disease. Continue Protonix. Followup with gastroenterology for possible GI etiology of pain. I will also check a d-dimer.

## 2011-10-27 NOTE — Assessment & Plan Note (Signed)
We have not added medicines for possible spasm as this would exacerbate his orthostatic hypotension.

## 2011-10-28 ENCOUNTER — Encounter: Payer: Medicare Other | Admitting: Physician Assistant

## 2011-10-28 LAB — BASIC METABOLIC PANEL WITH GFR
BUN: 13 mg/dL (ref 6–23)
Creat: 1.57 mg/dL — ABNORMAL HIGH (ref 0.50–1.35)
GFR, Est African American: 51 mL/min — ABNORMAL LOW
GFR, Est Non African American: 44 mL/min — ABNORMAL LOW
Glucose, Bld: 93 mg/dL (ref 70–99)

## 2011-10-28 NOTE — Telephone Encounter (Signed)
I spoke with pt's wife and he still has a few of these pills left and she will give him one tonight. She will call back when she needs refills.

## 2011-10-28 NOTE — Telephone Encounter (Signed)
Pls advise.  

## 2011-10-28 NOTE — Telephone Encounter (Signed)
Call in Temazepam 30 mg qhs #30 with 5 rf 

## 2011-10-29 ENCOUNTER — Other Ambulatory Visit: Payer: Self-pay | Admitting: *Deleted

## 2011-10-29 DIAGNOSIS — R7989 Other specified abnormal findings of blood chemistry: Secondary | ICD-10-CM

## 2011-11-01 ENCOUNTER — Encounter (HOSPITAL_COMMUNITY)
Admission: RE | Admit: 2011-11-01 | Discharge: 2011-11-01 | Disposition: A | Discharge status: Home / Self Care | Payer: Medicare Other | Source: Ambulatory Visit | Attending: Cardiology | Admitting: Cardiology

## 2011-11-01 ENCOUNTER — Other Ambulatory Visit: Payer: Self-pay | Admitting: Cardiology

## 2011-11-01 ENCOUNTER — Telehealth: Payer: Self-pay | Admitting: Cardiology

## 2011-11-01 ENCOUNTER — Ambulatory Visit (HOSPITAL_COMMUNITY)
Admission: RE | Admit: 2011-11-01 | Discharge: 2011-11-01 | Disposition: A | Discharge status: Home / Self Care | Payer: Medicare Other | Source: Ambulatory Visit | Attending: Cardiology | Admitting: Cardiology

## 2011-11-01 DIAGNOSIS — R7989 Other specified abnormal findings of blood chemistry: Secondary | ICD-10-CM

## 2011-11-01 DIAGNOSIS — R0602 Shortness of breath: Secondary | ICD-10-CM | POA: Insufficient documentation

## 2011-11-01 DIAGNOSIS — R5381 Other malaise: Secondary | ICD-10-CM | POA: Insufficient documentation

## 2011-11-01 MED ORDER — TECHNETIUM TO 99M ALBUMIN AGGREGATED
3.0000 | Freq: Once | INTRAVENOUS | Status: AC | PRN
  Administered 2011-11-01: 3 via INTRAVENOUS

## 2011-11-01 NOTE — Telephone Encounter (Signed)
Plz return call to patient wife Bjorn Loser after 2:30pm [phone] regarding medical questions

## 2011-11-01 NOTE — Telephone Encounter (Signed)
Spoke with wife, reviewed cxr/ lung scan and gave preliminary report, told them she will hear back with Dr Ronnald Nian review. All agreed to plan.

## 2011-11-02 ENCOUNTER — Telehealth: Payer: Self-pay | Admitting: Cardiology

## 2011-11-02 ENCOUNTER — Telehealth: Payer: Self-pay | Admitting: Family Medicine

## 2011-11-02 MED ORDER — LEVOTHYROXINE SODIUM 75 MCG PO TABS: 75.0000 ug | ORAL_TABLET | Freq: Every day | ORAL | Status: DC

## 2011-11-02 NOTE — Telephone Encounter (Signed)
I spoke with pt's wife and sent script e-scribe. 

## 2011-11-02 NOTE — Telephone Encounter (Signed)
Start on Synthroid 75 mcg daily. Call in 90 days worth and recheck a TSH in 90 days

## 2011-11-02 NOTE — Telephone Encounter (Signed)
Pt's wife called and she is requesting the lab results and CT scan. Also she was told that pt does have hypothyroidism and that Dr. [name] would handle that prescription.

## 2011-11-05 ENCOUNTER — Telehealth: Payer: Self-pay | Admitting: Family Medicine

## 2011-11-05 NOTE — Telephone Encounter (Signed)
Pt is sch for 11-08-2011 215pm

## 2011-11-05 NOTE — Telephone Encounter (Signed)
Caller: Ronda/Spouse; Patient Name: [name]; PCP: Gershon Crane [facility]); Best Callback Phone Number: [phone]; Reason for call: Wife is calling and would like to know if there is another medication that patient can take to help him sleep; taking Tamazapam 30mg  at bedtime for sleep; this was started in July; pt is not sleeping now; symptoms started "a long time;" symptoms are just getting worse in the last 2 weeks; Triaged Sleep Disorders Guideline; See in 72 hours due to known depressive disorder and sudden decreased need for sleep; pt would rather have something called in differently for sleep; wife states that he believes that MD will call in something else for him; unalbe to schedule with Dr [name] for Monday due to appointments full; OFFICE  PLEASE REVIEW AND CALL PT REGARDING POSSIBLE APPOINTMENT ON MONDAY WITH DR [name] AND POSSIBLE MEDICATION CHANGE PER PT REQUEST

## 2011-11-05 NOTE — Telephone Encounter (Signed)
Nettie Elm, Can you please speak to Dr. [name] about the refill part of this note?  Nelva Bush, will you please call pt and sched Monday appt. CAN said all appointments were full, but I see openings. Thanks!

## 2011-11-08 ENCOUNTER — Telehealth: Payer: Self-pay | Admitting: Family Medicine

## 2011-11-08 ENCOUNTER — Encounter: Payer: Self-pay | Admitting: Family Medicine

## 2011-11-08 ENCOUNTER — Ambulatory Visit (INDEPENDENT_AMBULATORY_CARE_PROVIDER_SITE_OTHER): Payer: Medicare Other | Admitting: Family Medicine

## 2011-11-08 VITALS — BP 118/70 | HR 84 | Temp 98.4°F | Wt 235.0 lb

## 2011-11-08 DIAGNOSIS — G4700 Insomnia, unspecified: Secondary | ICD-10-CM

## 2011-11-08 DIAGNOSIS — F329 Major depressive disorder, single episode, unspecified: Secondary | ICD-10-CM

## 2011-11-08 DIAGNOSIS — Z23 Encounter for immunization: Secondary | ICD-10-CM

## 2011-11-08 MED ORDER — RISPERIDONE 1 MG PO TABS: 1.0000 mg | ORAL_TABLET | Freq: Every day | ORAL | Status: DC

## 2011-11-08 NOTE — Telephone Encounter (Signed)
[facility] Triage Call Report Triage Record Num: [id number] Operator: Cornell Barman Patient Name: [name] Call Date & Time: 11/05/2011 5:23:46PM Patient Phone: [phone] PCP: Tera Mater. [name] Patient Gender: Male PCP Fax : [phone] Patient DOB: 02-Apr-1940 Practice Name: [facility] Reason for Call: Caller: [name]/Spouse; PCP: Gershon Crane [facility]); CB#: [phone]; Call regarding Requesting medication for sleep; She would like Dr. [name] to give her husband something for sleep until the office appointment on Monday. She states he is not resting at night . They have spoken to the office today and scheduled appointment but feel like he needs something for sleep until he is seen. Patient triaged earlier per sleep disorders protocol. Reviewed Epic a note has been sent to Dr. [name] to review. Updated wife . She expressed understanding. Advised I would forward message to office. They use Adult nurse in [place] [phone]. Protocol(s) Used: Office Note Recommended Outcome per Protocol: Information Noted and Sent to Office Reason for Outcome: Caller information to office Care Advice: ~ 09/

## 2011-11-08 NOTE — Telephone Encounter (Signed)
He will be seen today

## 2011-11-09 ENCOUNTER — Encounter: Payer: Self-pay | Admitting: Family Medicine

## 2011-11-09 ENCOUNTER — Telehealth: Payer: Self-pay | Admitting: Family Medicine

## 2011-11-09 NOTE — Telephone Encounter (Signed)
I think it is safe to take Temazepam and Risperdal together. This does not interfere with the Pristiq. Lets see how this works for him

## 2011-11-09 NOTE — Telephone Encounter (Signed)
Pt wife is calling back she is aware waiting on MD

## 2011-11-09 NOTE — Telephone Encounter (Signed)
I spoke with pt  

## 2011-11-09 NOTE — Telephone Encounter (Signed)
Wife called. Is concerned about the Temazepam and the risperidone. States he isn't sleeping well, having terrible dreams. She has a drug book and has been reading it. She is concerned about hese 2 drugs being in the same class, their interaction w/his Parkinsons meds, and the Pristiq. She has a lot of concerns and questions. Please advise, call her, or call her to have her come in.

## 2011-11-09 NOTE — Progress Notes (Signed)
  Subjective:    Patient ID: [name], male    DOB: [birth year], 71 y.o.   MRN: [id number]  HPI Here to follow up on depression and insomnia. He has been pleased with the Pristiq samples he has been taking, and his moods have been much lighter. Appetite is good. However he still struggles to sleep every night. He has tried Trazodone, Temazepam, Zolpidem, and various benzodiazepines with no results.    Review of Systems  Constitutional: Negative.   Respiratory: Negative.   Cardiovascular: Negative.   Psychiatric/Behavioral: Positive for disturbed wake/sleep cycle, dysphoric mood and decreased concentration. Negative for hallucinations, behavioral problems, confusion and agitation. The patient is nervous/anxious. The patient is not hyperactive.        Objective:   Physical Exam  Constitutional: He is oriented to person, place, and time. He appears well-developed and well-nourished.  Cardiovascular: Normal rate, normal heart sounds and intact distal pulses.        Irregular rhythm  Pulmonary/Chest: Effort normal and breath sounds normal.  Neurological: He is alert and oriented to person, place, and time.  Psychiatric: He has a normal mood and affect. His behavior is normal. Judgment and thought content normal.          Assessment & Plan:  His depression is stable. For sleep we will try Risperdal at bedtime, starting with 1 mg. We will titrate up after one week if needed.

## 2011-11-10 ENCOUNTER — Other Ambulatory Visit: Payer: Self-pay | Admitting: Family Medicine

## 2011-11-10 NOTE — Telephone Encounter (Signed)
Okay for 6 months 

## 2011-11-11 ENCOUNTER — Telehealth: Payer: Self-pay | Admitting: Family Medicine

## 2011-11-11 MED ORDER — TEMAZEPAM 30 MG PO CAPS: 30.0000 mg | ORAL_CAPSULE | Freq: Every evening | ORAL | Status: DC | PRN

## 2011-11-11 NOTE — Telephone Encounter (Signed)
Pt wife  Stated pharm never received request

## 2011-11-11 NOTE — Telephone Encounter (Signed)
We did this 3 days ago

## 2011-11-11 NOTE — Addendum Note (Signed)
Addended by: Alfred Levins D on: 11/11/2011 04:07 PM   Modules accepted: Orders

## 2011-11-11 NOTE — Telephone Encounter (Signed)
rx called in

## 2011-11-11 NOTE — Telephone Encounter (Signed)
Patient's wife called stating that he need a refill of his Temazepam. Please assist.   .

## 2011-11-12 NOTE — Telephone Encounter (Signed)
I spoke with pharmacy and gave another verbal order.

## 2011-11-13 ENCOUNTER — Emergency Department (HOSPITAL_COMMUNITY): Payer: Medicare Other

## 2011-11-13 ENCOUNTER — Inpatient Hospital Stay (HOSPITAL_COMMUNITY)
Admission: EM | Admit: 2011-11-13 | Discharge: 2011-11-16 | DRG: 392 | Disposition: A | Discharge status: Home / Self Care | Payer: Medicare Other | Attending: Cardiology | Admitting: Cardiology

## 2011-11-13 ENCOUNTER — Encounter (HOSPITAL_COMMUNITY): Payer: Self-pay | Admitting: Physical Medicine and Rehabilitation

## 2011-11-13 DIAGNOSIS — Z87891 Personal history of nicotine dependence: Secondary | ICD-10-CM

## 2011-11-13 DIAGNOSIS — I2510 Atherosclerotic heart disease of native coronary artery without angina pectoris: Secondary | ICD-10-CM | POA: Diagnosis present

## 2011-11-13 DIAGNOSIS — F411 Generalized anxiety disorder: Secondary | ICD-10-CM | POA: Diagnosis present

## 2011-11-13 DIAGNOSIS — R1319 Other dysphagia: Secondary | ICD-10-CM

## 2011-11-13 DIAGNOSIS — K219 Gastro-esophageal reflux disease without esophagitis: Secondary | ICD-10-CM | POA: Diagnosis present

## 2011-11-13 DIAGNOSIS — I10 Essential (primary) hypertension: Secondary | ICD-10-CM | POA: Diagnosis present

## 2011-11-13 DIAGNOSIS — J45909 Unspecified asthma, uncomplicated: Secondary | ICD-10-CM | POA: Diagnosis present

## 2011-11-13 DIAGNOSIS — Z79899 Other long term (current) drug therapy: Secondary | ICD-10-CM

## 2011-11-13 DIAGNOSIS — N183 Chronic kidney disease, stage 3 unspecified: Secondary | ICD-10-CM

## 2011-11-13 DIAGNOSIS — Z86711 Personal history of pulmonary embolism: Secondary | ICD-10-CM

## 2011-11-13 DIAGNOSIS — N390 Urinary tract infection, site not specified: Secondary | ICD-10-CM | POA: Diagnosis present

## 2011-11-13 DIAGNOSIS — Z9889 Other specified postprocedural states: Secondary | ICD-10-CM

## 2011-11-13 DIAGNOSIS — K224 Dyskinesia of esophagus: Principal | ICD-10-CM | POA: Diagnosis present

## 2011-11-13 DIAGNOSIS — G20 Parkinson's disease: Secondary | ICD-10-CM | POA: Diagnosis present

## 2011-11-13 DIAGNOSIS — R0789 Other chest pain: Secondary | ICD-10-CM

## 2011-11-13 DIAGNOSIS — D638 Anemia in other chronic diseases classified elsewhere: Secondary | ICD-10-CM | POA: Diagnosis present

## 2011-11-13 DIAGNOSIS — Q409 Congenital malformation of upper alimentary tract, unspecified: Secondary | ICD-10-CM

## 2011-11-13 DIAGNOSIS — K449 Diaphragmatic hernia without obstruction or gangrene: Secondary | ICD-10-CM | POA: Diagnosis present

## 2011-11-13 DIAGNOSIS — Z86718 Personal history of other venous thrombosis and embolism: Secondary | ICD-10-CM

## 2011-11-13 DIAGNOSIS — F329 Major depressive disorder, single episode, unspecified: Secondary | ICD-10-CM | POA: Diagnosis present

## 2011-11-13 DIAGNOSIS — R079 Chest pain, unspecified: Secondary | ICD-10-CM

## 2011-11-13 DIAGNOSIS — I4891 Unspecified atrial fibrillation: Secondary | ICD-10-CM | POA: Diagnosis present

## 2011-11-13 DIAGNOSIS — Z860100 Personal history of colon polyps, unspecified: Secondary | ICD-10-CM

## 2011-11-13 DIAGNOSIS — Z8719 Personal history of other diseases of the digestive system: Secondary | ICD-10-CM

## 2011-11-13 DIAGNOSIS — F0390 Unspecified dementia without behavioral disturbance: Secondary | ICD-10-CM | POA: Diagnosis present

## 2011-11-13 DIAGNOSIS — N400 Enlarged prostate without lower urinary tract symptoms: Secondary | ICD-10-CM | POA: Diagnosis present

## 2011-11-13 DIAGNOSIS — F3289 Other specified depressive episodes: Secondary | ICD-10-CM | POA: Diagnosis present

## 2011-11-13 DIAGNOSIS — I059 Rheumatic mitral valve disease, unspecified: Secondary | ICD-10-CM | POA: Diagnosis present

## 2011-11-13 DIAGNOSIS — K5900 Constipation, unspecified: Secondary | ICD-10-CM | POA: Diagnosis present

## 2011-11-13 DIAGNOSIS — Z886 Allergy status to analgesic agent status: Secondary | ICD-10-CM

## 2011-11-13 DIAGNOSIS — Z86011 Personal history of benign neoplasm of the brain: Secondary | ICD-10-CM

## 2011-11-13 DIAGNOSIS — E785 Hyperlipidemia, unspecified: Secondary | ICD-10-CM | POA: Diagnosis present

## 2011-11-13 DIAGNOSIS — Z7982 Long term (current) use of aspirin: Secondary | ICD-10-CM

## 2011-11-13 DIAGNOSIS — I201 Angina pectoris with documented spasm: Secondary | ICD-10-CM | POA: Diagnosis present

## 2011-11-13 DIAGNOSIS — G20A1 Parkinson's disease without dyskinesia, without mention of fluctuations: Secondary | ICD-10-CM | POA: Diagnosis present

## 2011-11-13 DIAGNOSIS — Z951 Presence of aortocoronary bypass graft: Secondary | ICD-10-CM

## 2011-11-13 DIAGNOSIS — I129 Hypertensive chronic kidney disease with stage 1 through stage 4 chronic kidney disease, or unspecified chronic kidney disease: Secondary | ICD-10-CM | POA: Diagnosis present

## 2011-11-13 DIAGNOSIS — R072 Precordial pain: Secondary | ICD-10-CM | POA: Diagnosis present

## 2011-11-13 LAB — COMPREHENSIVE METABOLIC PANEL
ALT: 9 U/L (ref 0–53)
AST: 13 U/L (ref 0–37)
Albumin: 4 g/dL (ref 3.5–5.2)
Albumin: 3.7 g/dL (ref 3.5–5.2)
Alkaline Phosphatase: 76 U/L (ref 39–117)
BUN: 12 mg/dL (ref 6–23)
CO2: 25 mEq/L (ref 19–32)
Calcium: 9.6 mg/dL (ref 8.4–10.5)
Calcium: 9.2 mg/dL (ref 8.4–10.5)
Creatinine, Ser: 1.36 mg/dL — ABNORMAL HIGH (ref 0.50–1.35)
GFR calc Af Amer: 61 mL/min — ABNORMAL LOW (ref >90)
GFR calc non Af Amer: 51 mL/min — ABNORMAL LOW (ref >90)
Glucose, Bld: 105 mg/dL — ABNORMAL HIGH (ref 70–99)
Potassium: 4.3 mEq/L (ref 3.5–5.1)
Sodium: 138 mEq/L (ref 135–145)
Total Protein: 7.5 g/dL (ref 6.0–8.3)
Total Protein: 6.8 g/dL (ref 6.0–8.3)

## 2011-11-13 LAB — CBC WITH DIFFERENTIAL/PLATELET
Basophils Absolute: 0.1 K/uL (ref 0.0–0.1)
Basophils Relative: 1 % (ref 0–1)
Eosinophils Absolute: 0.5 10*3/uL (ref 0.0–0.7)
Eosinophils Relative: 7 % — ABNORMAL HIGH (ref 0–5)
HCT: 34.8 % — ABNORMAL LOW (ref 39.0–52.0)
Hemoglobin: 10.6 g/dL — ABNORMAL LOW (ref 13.0–17.0)
Lymphocytes Relative: 14 % (ref 12–46)
Lymphs Abs: 1 K/uL (ref 0.7–4.0)
MCH: 24.7 pg — ABNORMAL LOW (ref 26.0–34.0)
MCHC: 30.5 g/dL (ref 30.0–36.0)
MCV: 80.9 fL (ref 78.0–100.0)
Monocytes Absolute: 0.6 K/uL (ref 0.1–1.0)
Monocytes Relative: 8 % (ref 3–12)
Neutro Abs: 5.3 K/uL (ref 1.7–7.7)
Neutrophils Relative %: 71 % (ref 43–77)
Platelets: 236 10*3/uL (ref 150–400)
RBC: 4.3 MIL/uL (ref 4.22–5.81)
RDW: 16.5 % — ABNORMAL HIGH (ref 11.5–15.5)
WBC: 7.5 K/uL (ref 4.0–10.5)

## 2011-11-13 LAB — CBC
HCT: 32.7 % — ABNORMAL LOW (ref 39.0–52.0)
Hemoglobin: 9.9 g/dL — ABNORMAL LOW (ref 13.0–17.0)
MCH: 24.4 pg — ABNORMAL LOW (ref 26.0–34.0)
MCHC: 30.3 g/dL (ref 30.0–36.0)
MCV: 80.5 fL (ref 78.0–100.0)
RBC: 4.06 MIL/uL — ABNORMAL LOW (ref 4.22–5.81)

## 2011-11-13 LAB — COMPREHENSIVE METABOLIC PANEL WITH GFR
ALT: 9 U/L (ref 0–53)
AST: 13 U/L (ref 0–37)
CO2: 23 meq/L (ref 19–32)
Chloride: 102 meq/L (ref 96–112)
Creatinine, Ser: 1.32 mg/dL (ref 0.50–1.35)
GFR calc non Af Amer: 53 mL/min — ABNORMAL LOW (ref >90)
Sodium: 136 meq/L (ref 135–145)
Total Bilirubin: 0.5 mg/dL (ref 0.3–1.2)

## 2011-11-13 LAB — POCT I-STAT TROPONIN I: Troponin i, poc: 0.02 ng/mL (ref 0.00–0.08)

## 2011-11-13 MED ORDER — RISPERIDONE 1 MG PO TABS
1.0000 mg | ORAL_TABLET | Freq: Every day | ORAL | Status: DC
  Administered 2011-11-13 – 2011-11-15 (×3): 1 mg via ORAL
  Filled 2011-11-13 (×3): qty 2
  Filled 2011-11-13: qty 1

## 2011-11-13 MED ORDER — NITROGLYCERIN 0.4 MG SL SUBL
0.4000 mg | SUBLINGUAL_TABLET | SUBLINGUAL | Status: DC | PRN
  Administered 2011-11-14: 0.4 mg via SUBLINGUAL
  Filled 2011-11-13: qty 25

## 2011-11-13 MED ORDER — NITROGLYCERIN 0.4 MG SL SUBL: 0.4000 mg | SUBLINGUAL_TABLET | SUBLINGUAL | Status: DC | PRN

## 2011-11-13 MED ORDER — SODIUM CHLORIDE 0.9 % IJ SOLN
3.0000 mL | Freq: Two times a day (BID) | INTRAMUSCULAR | Status: DC
  Administered 2011-11-13 – 2011-11-15 (×4): 3 mL via INTRAVENOUS

## 2011-11-13 MED ORDER — HYDROCORTISONE 1 % EX CREA
1.0000 "application " | TOPICAL_CREAM | Freq: Two times a day (BID) | CUTANEOUS | Status: DC
  Administered 2011-11-14 – 2011-11-15 (×3): 1 via TOPICAL
  Filled 2011-11-13: qty 28

## 2011-11-13 MED ORDER — SODIUM CHLORIDE 0.9 % IV SOLN: 250.0000 mL | INTRAVENOUS | Status: DC | PRN

## 2011-11-13 MED ORDER — CARBIDOPA-LEVODOPA 25-100 MG PO TABS
1.0000 | ORAL_TABLET | Freq: Three times a day (TID) | ORAL | Status: DC
  Administered 2011-11-13 – 2011-11-16 (×8): 1 via ORAL
  Filled 2011-11-13 (×12): qty 1

## 2011-11-13 MED ORDER — TEMAZEPAM 15 MG PO CAPS: 30.0000 mg | ORAL_CAPSULE | Freq: Every evening | ORAL | Status: DC | PRN

## 2011-11-13 MED ORDER — POLYETHYLENE GLYCOL 3350 17 G PO PACK
17.0000 g | PACK | Freq: Every day | ORAL | Status: DC
  Filled 2011-11-13 (×3): qty 1

## 2011-11-13 MED ORDER — SODIUM CHLORIDE 0.9 % IJ SOLN: 3.0000 mL | INTRAMUSCULAR | Status: DC | PRN

## 2011-11-13 MED ORDER — ACETAMINOPHEN 325 MG PO TABS: 650.0000 mg | ORAL_TABLET | ORAL | Status: DC | PRN

## 2011-11-13 MED ORDER — PANTOPRAZOLE SODIUM 40 MG PO TBEC
40.0000 mg | DELAYED_RELEASE_TABLET | Freq: Two times a day (BID) | ORAL | Status: DC
  Administered 2011-11-13 – 2011-11-16 (×6): 40 mg via ORAL
  Filled 2011-11-13 (×5): qty 1

## 2011-11-13 MED ORDER — NITROGLYCERIN 0.4 MG SL SUBL
0.4000 mg | SUBLINGUAL_TABLET | SUBLINGUAL | Status: AC | PRN
  Administered 2011-11-13 (×2): 0.4 mg via SUBLINGUAL
  Filled 2011-11-13: qty 25

## 2011-11-13 MED ORDER — LEVOTHYROXINE SODIUM 75 MCG PO TABS: 75.0000 ug | ORAL_TABLET | Freq: Every day | ORAL | Status: DC

## 2011-11-13 MED ORDER — RISPERIDONE 0.5 MG PO TABS: 1.0000 mg | ORAL_TABLET | Freq: Every day | ORAL | Status: DC

## 2011-11-13 MED ORDER — ENOXAPARIN SODIUM 40 MG/0.4ML ~~LOC~~ SOLN: 40.0000 mg | SUBCUTANEOUS | Status: DC

## 2011-11-13 MED ORDER — ONDANSETRON HCL 4 MG/2ML IJ SOLN: 4.0000 mg | Freq: Four times a day (QID) | INTRAMUSCULAR | Status: DC | PRN

## 2011-11-13 MED ORDER — ASPIRIN EC 325 MG PO TBEC: 325.0000 mg | DELAYED_RELEASE_TABLET | Freq: Every day | ORAL | Status: DC

## 2011-11-13 MED ORDER — AMIODARONE HCL 200 MG PO TABS
200.0000 mg | ORAL_TABLET | Freq: Every day | ORAL | Status: DC
  Administered 2011-11-14 – 2011-11-16 (×3): 200 mg via ORAL
  Filled 2011-11-13 (×3): qty 1

## 2011-11-13 MED ORDER — ASPIRIN EC 325 MG PO TBEC
325.0000 mg | DELAYED_RELEASE_TABLET | Freq: Every day | ORAL | Status: DC
  Filled 2011-11-13: qty 1

## 2011-11-13 MED ORDER — ADULT MULTIVITAMIN W/MINERALS CH
1.0000 | ORAL_TABLET | Freq: Every day | ORAL | Status: DC
  Administered 2011-11-14 – 2011-11-16 (×3): 1 via ORAL
  Filled 2011-11-13 (×3): qty 1

## 2011-11-13 MED ORDER — PROMETHAZINE HCL 25 MG PO TABS: 25.0000 mg | ORAL_TABLET | Freq: Four times a day (QID) | ORAL | Status: DC | PRN

## 2011-11-13 MED ORDER — TAMSULOSIN HCL 0.4 MG PO CAPS
0.4000 mg | ORAL_CAPSULE | Freq: Every day | ORAL | Status: DC
  Administered 2011-11-14 – 2011-11-16 (×3): 0.4 mg via ORAL
  Filled 2011-11-13 (×3): qty 1

## 2011-11-13 MED ORDER — POLYETHYLENE GLYCOL 3350 17 G PO PACK
17.0000 g | PACK | Freq: Two times a day (BID) | ORAL | Status: DC | PRN
  Filled 2011-11-13: qty 1

## 2011-11-13 MED ORDER — CLONAZEPAM 1 MG PO TABS
1.0000 mg | ORAL_TABLET | Freq: Four times a day (QID) | ORAL | Status: DC
  Administered 2011-11-13 – 2011-11-16 (×9): 1 mg via ORAL
  Filled 2011-11-13 (×3): qty 1
  Filled 2011-11-13: qty 2
  Filled 2011-11-13 (×2): qty 1
  Filled 2011-11-13 (×2): qty 2
  Filled 2011-11-13 (×2): qty 1

## 2011-11-13 MED ORDER — VENLAFAXINE HCL ER 75 MG PO CP24
75.0000 mg | ORAL_CAPSULE | Freq: Every day | ORAL | Status: DC
  Administered 2011-11-14 – 2011-11-16 (×3): 75 mg via ORAL
  Filled 2011-11-13 (×4): qty 1

## 2011-11-13 MED ORDER — HEPARIN (PORCINE) IN NACL 100-0.45 UNIT/ML-% IJ SOLN
1500.0000 [IU]/h | INTRAMUSCULAR | Status: DC
  Administered 2011-11-13: 1500 [IU]/h via INTRAVENOUS
  Filled 2011-11-13 (×2): qty 250

## 2011-11-13 MED ORDER — HEPARIN BOLUS VIA INFUSION
4000.0000 [IU] | Freq: Once | INTRAVENOUS | Status: AC
  Administered 2011-11-13: 4000 [IU] via INTRAVENOUS
  Filled 2011-11-13: qty 4000

## 2011-11-13 MED ORDER — LEVOTHYROXINE SODIUM 75 MCG PO TABS
75.0000 ug | ORAL_TABLET | Freq: Every day | ORAL | Status: DC
  Administered 2011-11-14: 07:00:00 via ORAL
  Administered 2011-11-15 – 2011-11-16 (×2): 75 ug via ORAL
  Filled 2011-11-13 (×4): qty 1

## 2011-11-13 NOTE — ED Provider Notes (Signed)
History     CSN: [id number]  Arrival date & time 11/13/11  1154   First MD Initiated Contact with Patient 11/13/11 1321      Chief Complaint  Patient presents with  . Chest Pain  . Numbness    (Consider location/radiation/quality/duration/timing/severity/associated sxs/prior treatment) HPI Comments: Pt with h/o CAD s/p CABG, also with Parkinson's and GERD on protonix BID, was in the hospital for CP's, had 2 cardiac cath's with Dr. Excell Seltzer, no new blockages found, was told to start taking NTG prn.  Pt usually doesn't have CP's, has had 3-4 severe episodes lasting about 30-40 minutes, improved with NTG.  Pain gets severe in midsternal area, no radiation to jaw, back or abd, associated with blurred vision and whole body gets numb like he cannot feel.  No new weakness.  Pt has had episodes of numbness for years per spouse, no clear cause.  Pt's last episode was about 10 AM today soon after breakfast.  No abd pain.  No diarrhea.  No fevers, cough or cold symptoms.  Has taken most of usual daily AM meds this far today.  Had 2 episodes yesterday and one the evening before.    The history is provided by the patient, the spouse and medical records.    Past Medical History  Diagnosis Date  . Atrial fibrillation     a. Paroxysmal, not Coumadin candidate 2/2 Parkinson's/gait instability  . PE (pulmonary embolism)     a. >20 years ago;  b. nl V:Q scan 10/2010  . DVT (deep venous thrombosis)   . Meningioma   . BPH (benign prostatic hyperplasia)   . Personal history of colonic polyps   . Venereal wart   . Depression   . Asthma   . Chickenpox   . Thrombophlebitis   . GERD (gastroesophageal reflux disease)   . Parkinson disease   . Dementia   . CAD (coronary artery disease)     a. 08/2009 s/p CABG x 2 (LIMA->LAD, SVG-OM). b. Adm w/ CP 09/2011 - questionable functionality of LIMA graft but patient declined further catheterization (cath 10/05/11 -- ostial LAD disease, possible flap in prox L  subclavian/unable to image LIMA well, atretic SVG-OM)  . Hyperlipidemia   . Stricture and stenosis of esophagus   . Hiatal hernia   . Diverticulosis of colon (without mention of hemorrhage)   . Mitral regurgitation     a. 08/2009 s/p MV repair 22mm Edwards ring and oversewing of LA appendage @ time of CABG;  b. cardiac MRI 09/2011 showing some disruption of lateral annuloplasty ring with "perivalvular" MR and some central MR   . Thrombus of left atrial appendage     a. Abnormal dye staining on cath 09/2011 - imaged by CT/MRI/TEE - felt to be surgically isolated from the LA  . Anginal pain   . Anxiety   . Shortness of breath   . Pneumonia   . Heart murmur   . Arthritis     Past Surgical History  Procedure Date  . Inguinal hernia repair   . Tonsilectomy, adenoidectomy, bilateral myringotomy and tubes   . Varicose vein surgery     left leg  . Back surgery     herniated disc & remove bone spurs  . Coronary artery bypass graft   . Mitral valve repair   . Umbilical hernia repair   . External ear surgery   . Tee without cardioversion 10/06/2011    Procedure: TRANSESOPHAGEAL ECHOCARDIOGRAM (TEE);  Surgeon: Vesta Mixer, MD;  Location: [facility] ENDOSCOPY;  Service: Cardiovascular;  Laterality: N/A;  . Cardiac catheterization     Family History  Problem Relation Age of Onset  . Arthritis    . Diabetes    . Stroke    . Heart disease Mother     History  Substance Use Topics  . Smoking status: Former Games developer  . Smokeless tobacco: Never Used  . Alcohol Use: No      Review of Systems  Constitutional: Negative for fever and chills.  HENT: Negative for congestion and rhinorrhea.   Respiratory: Positive for chest tightness and shortness of breath.   Cardiovascular: Positive for chest pain.  Gastrointestinal: Negative for nausea, vomiting, abdominal pain and diarrhea.  Musculoskeletal: Negative for back pain.  Skin: Negative for rash.  Neurological: Positive for numbness. Negative for  seizures, syncope and headaches.  All other systems reviewed and are negative.    Allergies  Ibuprofen  Home Medications   Current Outpatient Rx  Name Route Sig Dispense Refill  . AMIODARONE HCL 200 MG PO TABS Oral Take 1 tablet (200 mg total) by mouth daily.    . ASPIRIN EC 325 MG PO TBEC Oral Take 325 mg by mouth daily.    Marland Kitchen CARBIDOPA-LEVODOPA 25-100 MG PO TABS Oral Take 1 tablet by mouth 3 (three) times daily. 1 tablet at breakfast and lunch    . CLONAZEPAM 1 MG PO TABS Oral Take 1 mg by mouth every 6 (six) hours.    . DESVENLAFAXINE SUCCINATE ER 50 MG PO TB24 Oral Take 50 mg by mouth every morning.     Marland Kitchen HYDROCORTISONE 1 % EX CREA Topical Apply 1 application topically 2 (two) times daily.    Marland Kitchen LEVOTHYROXINE SODIUM 75 MCG PO TABS Oral Take 75 mcg by mouth daily.    . ADULT MULTIVITAMIN W/MINERALS CH Oral Take 1 tablet by mouth daily.    Marland Kitchen NITROGLYCERIN 0.4 MG SL SUBL Sublingual Place 0.4 mg under the tongue every 5 (five) minutes as needed. For chest pain    . PANTOPRAZOLE SODIUM 40 MG PO TBEC Oral Take 40 mg by mouth 2 (two) times daily.    Marland Kitchen POLYETHYLENE GLYCOL 3350 PO PACK Oral Take 17 g by mouth 2 (two) times daily as needed. Can use BID PRN for constipation    . PRAVASTATIN SODIUM 40 MG PO TABS Oral Take 40 mg by mouth daily.    Marland Kitchen PROMETHAZINE HCL 25 MG PO TABS Oral Take 25 mg by mouth every 6 (six) hours as needed. For nausea    . RISPERIDONE 1 MG PO TABS Oral Take 1 mg by mouth at bedtime.    . TAMSULOSIN HCL 0.4 MG PO CAPS Oral Take 0.4 mg by mouth daily.    Marland Kitchen TEMAZEPAM 30 MG PO CAPS Oral Take 30 mg by mouth at bedtime as needed. For sleep      BP 118/85  Pulse 76  Temp 98.2 F (36.8 C) (Oral)  Resp 20  Ht 6\' 3"  (1.905 m)  Wt 235 lb (106.595 kg)  BMI 29.37 kg/m2  SpO2 98%  Physical Exam  Nursing note and vitals reviewed. Constitutional: He appears well-developed and well-nourished.  HENT:  Head: Normocephalic and atraumatic.  Eyes: EOM are normal. Pupils are  equal, round, and reactive to light.  Neck: Normal range of motion. Neck supple.  Cardiovascular: Normal rate and regular rhythm.   Murmur heard. Pulmonary/Chest: Effort normal. No respiratory distress. He has no wheezes.  Abdominal: Soft. He exhibits no distension.  There is no tenderness. There is no rebound.  Musculoskeletal: He exhibits no edema.  Neurological: He is alert.  Skin: Skin is warm and dry. No rash noted. No pallor.  Psychiatric: He has a normal mood and affect.    ED Course  Procedures (including critical care time)  Labs Reviewed  CBC WITH DIFFERENTIAL - Abnormal; Notable for the following:    Hemoglobin 10.6 (*)     HCT 34.8 (*)     MCH 24.7 (*)     RDW 16.5 (*)     Eosinophils Relative 7 (*)     All other components within normal limits  COMPREHENSIVE METABOLIC PANEL - Abnormal; Notable for the following:    Glucose, Bld 105 (*)     GFR calc non Af Amer 53 (*)     GFR calc Af Amer 61 (*)     All other components within normal limits  POCT I-STAT TROPONIN I   Dg Chest 2 View  11/13/2011  *RADIOLOGY REPORT*  Clinical Data: Chest pain and numbness.  CHEST - 2 VIEW  Comparison: Chest x-ray 11/01/2011.  Findings: Chronic areas of pleural thickening and/or small bilateral pleural effusions are similar to the prior examination. There are some ill-defined opacities throughout the right mid and lower lung, and in the medial aspect of the left buttocks which are unchanged compared to priors, and appear to correspond to areas of scarring or developing rounded atelectasis based on comparison with prior CT of the chest 07/11/2011.  No definite new focal air space disease is identified to strongly suggest acute infection at this time.  Pulmonary vasculature is within normal limits.  Heart size is borderline enlarged with postoperative changes of median sternotomy for CABG and mitral annuloplasty.  IMPRESSION: 1.  The appearance of the chest is similar to prior studies, as above,  without definite radiographic evidence of acute cardiopulmonary disease.   Original Report Authenticated By: Florencia Reasons, M.D.      1. Chest pain     ECG at time 11:58 shows SR at rate 80, normal axis, no ST or T wave abn's.  Borderline prolonged QT at 491 ms.  On prior ECG from 10/22/11, QTC was 493.  No sig changes noted.    4:19 PM Pt had another episode of chest pain while in the ED.  No new change son ECG except PVC noted.  I spoke to Dr. Donnie Aho who will see pt in the ED.    MDM  Pt's symptoms may be from esophageal spasms.  Pt had complete work up a few months ago inpatient.  Pt has relief with NTG.  Will discuss with Dr. Donnie Aho and arrange follow up with [facility].  Pt's spouse has concerns regarding Parkinson's, however I think this can be seen as outpt and don't think Parkinson's has anything to do with what is occurring here with CP episodes.  No pain at present.  Troponin is neg.          [name]. Aryelle Figg, MD 11/13/11 0981

## 2011-11-13 NOTE — ED Notes (Signed)
Patient reports increased diff swallowing and coughing over the past week.  He has hx of parkinsons,  Not seen by neuro x 6 mths

## 2011-11-13 NOTE — Plan of Care (Signed)
Problem: Phase III Progression Outcomes Goal: Hemodynamically stable Outcome: Progressing BP wdl during shift. Will continue to monitor. Goal: No anginal pain Outcome: Completed/Met Date Met:  11/13/11 Denies chest pain or sob. Will continue to monitor. Goal: Cath/PCI Path as indicated Outcome: Progressing Pt remains pain free. Will wait for MD decision about doing a cath. Goal: Tolerating diet Outcome: Completed/Met Date Met:  11/13/11 Tolerates current diet without problems.

## 2011-11-13 NOTE — ED Notes (Signed)
Pt presents to department for evaluation of midsternal chest pressure and "numbness" feeling all over body. Onset yesterday. Pt states episode lasted x1 hour and then resolved after taking nitroglycerin. Also states SOB. Denies pain at the time. Able to move all extremities. No neurological deficits noted. Pt is conscious alert and oriented x4.

## 2011-11-13 NOTE — H&P (Signed)
History and Physical   Admit date: 11/13/2011 Name:  [name] Medical record number: [id number] DOB/Age:  10/02/1940  71 y.o. male  Referring Physician:   [facility]  Primary Cardiologist: Dr. Olga Millers Primary Physician: Dr. Gershon Crane Chief complaint/reason for admission:  Chest pain  HPI:  This 71 year old male had mitral valve repair and 2 vessel bypass in 2011. He presented recently with recurrent chest discomfort and after an extensive evaluation was found to have a patent LAD and a patent but H. reticulocyte mammary artery as well as an atretic vein graft to the marginal branch. He has parkinsonism and is somewhat limited. He walks with date of a cane or a walker. He presents to the emergency room today with a 3 day history of substernal and epigastric chest discomfort that occurs after eating. The first episode occurred when seen was relieved after about 2 hours with nitroglycerin. He uses nitroglycerin for these episodes and had been daily including 2 last night and his wife brought him to the emergency room after another episode this morning. Initial troponin and EKG are negative. He was recently started on Synthroid for hypothyroidism. He also has been started onPristique for depression and is quite inactive. He denies PND or orthopnea. He does have some mild edema of his ankles and his wife notes that his hands are swelling. He does not have any recent syncope.   Past Medical History  Diagnosis Date  . Atrial fibrillation     a. Paroxysmal, not Coumadin candidate 2/2 Parkinson's/gait instability  . PE (pulmonary embolism)     a. >20 years ago;  b. nl V:Q scan 10/2010  . DVT (deep venous thrombosis)   . Meningioma   . BPH (benign prostatic hyperplasia)   . Personal history of colonic polyps   . Venereal wart   . Depression   . Asthma   . Chickenpox   . Thrombophlebitis   . GERD (gastroesophageal reflux disease)   . Parkinson disease   . Dementia    . CAD (coronary artery disease)     a. 08/2009 s/p CABG x 2 (LIMA->LAD, SVG-OM). b. Adm w/ CP 09/2011 - questionable functionality of LIMA graft but patient declined further catheterization (cath 10/05/11 -- ostial LAD disease, possible flap in prox L subclavian/unable to image LIMA well, atretic SVG-OM)  . Hyperlipidemia   . Stricture and stenosis of esophagus   . Hiatal hernia   . Diverticulosis of colon (without mention of hemorrhage)   . Mitral regurgitation     a. 08/2009 s/p MV repair 22mm Edwards ring and oversewing of LA appendage @ time of CABG;  b. cardiac MRI 09/2011 showing some disruption of lateral annuloplasty ring with "perivalvular" MR and some central MR   . Thrombus of left atrial appendage     a. Abnormal dye staining on cath 09/2011 - imaged by CT/MRI/TEE - felt to be surgically isolated from the LA  . Anxiety   . Pneumonia   . Arthritis      Past Surgical History  Procedure Date  . Inguinal hernia repair   . Tonsilectomy, adenoidectomy, bilateral myringotomy and tubes   . Varicose vein surgery     left leg  . Back surgery     herniated disc & remove bone spurs  . Coronary artery bypass graft   . Mitral valve repair   . Umbilical hernia repair   . External ear surgery   . Tee without cardioversion 10/06/2011  Procedure: TRANSESOPHAGEAL ECHOCARDIOGRAM (TEE);  Surgeon: Vesta Mixer, MD;  Location: [facility];  Service: Cardiovascular;  Laterality: N/A;  . Cardiac catheterization    Allergies: is allergic to ibuprofen.   Medications: Prior to Admission medications   Medication Sig Start Date End Date Taking? Authorizing Provider  amiodarone (PACERONE) 200 MG tablet Take 1 tablet (200 mg total) by mouth daily. 05/12/11  Yes Lewayne Bunting, MD  aspirin EC 325 MG tablet Take 325 mg by mouth daily. 10/22/11  Yes [name] G Barrett, PA  carbidopa-levodopa (SINEMET) 25-100 MG per tablet Take 1 tablet by mouth 3 (three) times daily. 1 tablet at breakfast and lunch  03/05/11  Yes Nelwyn Salisbury, MD  clonazePAM (KLONOPIN) 1 MG tablet Take 1 mg by mouth every 6 (six) hours.   Yes Historical Provider, MD  desvenlafaxine (PRISTIQ) 50 MG 24 hr tablet Take 50 mg by mouth every morning.  09/20/11 09/19/12 Yes Nelwyn Salisbury, MD  hydrocortisone cream 1 % Apply 1 application topically 2 (two) times daily.   Yes Historical Provider, MD  levothyroxine (SYNTHROID, LEVOTHROID) 75 MCG tablet Take 75 mcg by mouth daily. 11/02/11 11/01/12 Yes Nelwyn Salisbury, MD  Multiple Vitamin (MULITIVITAMIN WITH MINERALS) TABS Take 1 tablet by mouth daily.   Yes Historical Provider, MD  nitroGLYCERIN (NITROSTAT) 0.4 MG SL tablet Place 0.4 mg under the tongue every 5 (five) minutes as needed. For chest pain 10/08/11 10/07/12 Yes Dayna N Dunn, PA  pantoprazole (PROTONIX) 40 MG tablet Take 40 mg by mouth 2 (two) times daily.   Yes Historical Provider, MD  polyethylene glycol (MIRALAX / GLYCOLAX) packet Take 17 g by mouth 2 (two) times daily as needed. Can use BID PRN for constipation 10/22/11  Yes [name] G Barrett, PA  pravastatin (PRAVACHOL) 40 MG tablet Take 40 mg by mouth daily.   Yes Historical Provider, MD  promethazine (PHENERGAN) 25 MG tablet Take 25 mg by mouth every 6 (six) hours as needed. For nausea   Yes Historical Provider, MD  risperiDONE (RISPERDAL) 1 MG tablet Take 1 mg by mouth at bedtime. 11/08/11  Yes Nelwyn Salisbury, MD  Tamsulosin HCl (FLOMAX) 0.4 MG CAPS Take 0.4 mg by mouth daily.   Yes Historical Provider, MD  temazepam (RESTORIL) 30 MG capsule Take 30 mg by mouth at bedtime as needed. For sleep 11/11/11  Yes Nelwyn Salisbury, MD    Family History:  No family status information on file.    Social History:   reports that he has quit smoking. He has never used smokeless tobacco. He reports that he does not drink alcohol or use illicit drugs.   History   Social History Narrative  . No narrative on file     Review of Systems: He has some constipation and uses MiraLAX. He does  not have severe arthritis. He has a history of BPH and has significant nocturia. He had an episode of nausea and thought that he might have to vomit last night. He has poor vision he also complains of numbness and tingling all over when he has these episodes. He does have a history of Parkinson's and has difficulties with his gait as well as with tremor. He denies any abnormal weight loss he also has some mild dyspnea. Other than as noted above, the remainder of the review of systems is normal  Physical Exam: BP 118/85  Pulse 76  Temp 98.2 F (36.8 C) (Oral)  Resp 20  Ht 6\' 3"  (1.905 m)  Wt 106.595 kg (235 lb)  BMI 29.37 kg/m2  SpO2 98% General appearance: alert, cooperative, appears older than stated age and no distress Head: Normocephalic, without obvious abnormality, atraumatic Eyes: conjunctivae/corneas clear. PERRL, EOM's intact. Fundi not examined.  Neck: no adenopathy, no carotid bruit, no JVD and supple, symmetrical, trachea midline Lungs: clear to auscultation bilaterally Heart: Regular rhythm, normal S1-S2, no S3. 2/6 systolic murmur blowing heard best at the apex, no diastolic murmur Abdomen: soft, non-tender; bowel sounds normal; no masses,  no organomegaly Rectal: deferred Extremities: extremities normal, atraumatic, no cyanosis or edema Pulses: 2+ and symmetric Skin: Skin color, texture, turgor normal. No rashes or lesions Neurologic: Resting tremor of both hands, somewhat flat affect and face these, sensory and strength are grossly normal. Motor was not formally tested.   Labs: CBC  Basename 11/13/11 1250  WBC 7.5  RBC 4.30  HGB 10.6*  HCT 34.8*  PLT 236  MCV 80.9  MCH 24.7*  MCHC 30.5  RDW 16.5*  LYMPHSABS 1.0  MONOABS 0.6  EOSABS 0.5  BASOSABS 0.1   CMP   Basename 11/13/11 1250  NA 136  K 4.3  CL 102  CO2 23  GLUCOSE 105*  BUN 12  CREATININE 1.32  CALCIUM 9.6  PROT 7.5  ALBUMIN 4.0  AST 13  ALT 9  ALKPHOS 76  BILITOT 0.5  GFRNONAA 53*    GFRAA 61*   BNP (last 3 results)  Basename 10/05/11 0508 10/04/11 1647 07/12/11 0045  PROBNP 534.4* 656.8* 526.9*   Cardiac Panel (last 3 results) No results found for this basename: CKTOTAL:3,CKMB:3,TROPONINI:3,RELINDX:3 in the last 72 hours Thyroid  Lab Results  Component Value Date   TSH 6.916* 10/27/2011    EKG: Normal sinus rhythm  Radiology: Reinhard size with vague atelectasis or opacities noted   IMPRESSIONS: 1. Recurrent chest pain relieved with nitroglycerin but with previous invasive evaluation showing nonobstructive disease in the LAD by IVUS 2. Previous mitral valve repair with residual mitral regurgitation due 2 disruption of the lateral annulus 3. History of atrial fibrillation currently in sinus rhythm not warfarin candidate 4. Parkinson's disease 5. Anxiety and depression 6. Hypertension 7. Hyperlipidemia 8. History of meningioma  PLAN: The patient has had a recent interventional evaluation including IVUS. I think he will need to have a GI evaluation and will also have to have serial cardiac enzymes. Check serial EKGs.  Signed: Darden Palmer MD [facility]  11/13/2011, 4:34 PM

## 2011-11-13 NOTE — Progress Notes (Signed)
ANTICOAGULATION CONSULT NOTE - Initial Consult  Pharmacy Consult for heparin Indication: chest pain/ACS  Allergies  Allergen Reactions  . Ibuprofen Other (See Comments)    Stomach hurts    Patient Measurements: Height: 6\' 3"  (190.5 cm) Weight: 235 lb (106.595 kg) IBW/kg (Calculated) : 84.5  Heparin Dosing Weight: 105.9kg  Vital Signs: Temp: 98.2 F (36.8 C) (09/21 1600) Temp src: Oral (09/21 1600) BP: 118/85 mmHg (09/21 1600) Pulse Rate: 76  (09/21 1600)  Labs:  Basename 11/13/11 1250  HGB 10.6*  HCT 34.8*  PLT 236  APTT --  LABPROT --  INR --  HEPARINUNFRC --  CREATININE 1.32  CKTOTAL --  CKMB --  TROPONINI --    Estimated Creatinine Clearance: 68.7 ml/min (by C-G formula based on Cr of 1.32).   Medical History: Past Medical History  Diagnosis Date  . Atrial fibrillation     a. Paroxysmal, not Coumadin candidate 2/2 Parkinson's/gait instability  . PE (pulmonary embolism)     a. >20 years ago;  b. nl V:Q scan 10/2010  . DVT (deep venous thrombosis)   . Meningioma   . BPH (benign prostatic hyperplasia)   . Personal history of colonic polyps   . Venereal wart   . Depression   . Asthma   . Chickenpox   . Thrombophlebitis   . GERD (gastroesophageal reflux disease)   . Parkinson disease   . Dementia   . CAD (coronary artery disease)     a. 08/2009 s/p CABG x 2 (LIMA->LAD, SVG-OM). b. Adm w/ CP 09/2011 - questionable functionality of LIMA graft but patient declined further catheterization (cath 10/05/11 -- ostial LAD disease, possible flap in prox L subclavian/unable to image LIMA well, atretic SVG-OM)  . Hyperlipidemia   . Stricture and stenosis of esophagus   . Hiatal hernia   . Diverticulosis of colon (without mention of hemorrhage)   . Mitral regurgitation     a. 08/2009 s/p MV repair 22mm Edwards ring and oversewing of LA appendage @ time of CABG;  b. cardiac MRI 09/2011 showing some disruption of lateral annuloplasty ring with "perivalvular" MR and some  central MR   . Thrombus of left atrial appendage     a. Abnormal dye staining on cath 09/2011 - imaged by CT/MRI/TEE - felt to be surgically isolated from the LA  . Anxiety   . Pneumonia   . Arthritis    Assessment: 82 yom with history of CAD w/ CABG to start on IV heparin for CP. Pt is slightly anemic. Pt is not on any anticoagulation PTA despite history of DVT/PE and afib. Pt is not a coumadin candidate per history.   Goal of Therapy:  Heparin level 0.3-0.7 units/ml Monitor platelets by anticoagulation protocol: Yes   Plan:  1. Heparin bolus 4000 units IV x 1 2. Heparin gtt 1500 units/hr 3. Check an 8 hour heparin level 4. Daily heparin level and CBC  Arian Murley, Drake Leach 11/13/2011,5:17 PM

## 2011-11-14 LAB — URINALYSIS, ROUTINE W REFLEX MICROSCOPIC
Bilirubin Urine: NEGATIVE
Glucose, UA: NEGATIVE mg/dL
Ketones, ur: NEGATIVE mg/dL
Nitrite: NEGATIVE
Specific Gravity, Urine: 1.012 (ref 1.005–1.030)
pH: 7.5 (ref 5.0–8.0)

## 2011-11-14 LAB — CBC
HCT: 31 % — ABNORMAL LOW (ref 39.0–52.0)
Hemoglobin: 9.6 g/dL — ABNORMAL LOW (ref 13.0–17.0)
MCHC: 31 g/dL (ref 30.0–36.0)
MCV: 80.9 fL (ref 78.0–100.0)
RDW: 16.4 % — ABNORMAL HIGH (ref 11.5–15.5)

## 2011-11-14 LAB — TROPONIN I: Troponin I: <0.3 ng/mL (ref <0.30)

## 2011-11-14 LAB — MRSA PCR SCREENING: MRSA by PCR: NEGATIVE

## 2011-11-14 LAB — URINE MICROSCOPIC-ADD ON

## 2011-11-14 MED ORDER — ASPIRIN EC 81 MG PO TBEC
81.0000 mg | DELAYED_RELEASE_TABLET | Freq: Every day | ORAL | Status: DC
  Administered 2011-11-14: 81 mg via ORAL
  Filled 2011-11-14 (×2): qty 1

## 2011-11-14 MED ORDER — ENOXAPARIN SODIUM 40 MG/0.4ML ~~LOC~~ SOLN
40.0000 mg | Freq: Every day | SUBCUTANEOUS | Status: DC
  Administered 2011-11-14: 40 mg via SUBCUTANEOUS
  Filled 2011-11-14 (×2): qty 0.4

## 2011-11-14 NOTE — Consult Note (Signed)
Consult for [facility]  Reason for Consult:Noncardiac chest pain Referring Physician: Cardiology  [name] HPI: This is a 71 year old male who is admitted for recurrent chest pain. The patient has a history of chest pain, but of late it seems to be worsening.  The pain will be to the point that he rates it as a 10/10.  At times it can be brought about with PO intake, but overall he reports that stressful situations will consistently bring about his symptoms.  The patient denies any significant stress at this time.  In the past he was evaluated by Dr. Jarold Motto with an EGD and he was identified to have a hiatal hernia.  He has been taking Protonix for quite some time and there is no change to his current symptoms.  He has a history of Parkinson's disease and it has resulted in dysphagia.  He cannot tolerate any significant solid foods and he states that his Neurologist ordered a manometry in the past.  Aside from remaining calm during the chest pain episodes taking SL NTG helps to resolve the symptoms.  No problems with nausea or vomiting and his cardiac work up of late cannot explain the source of the pain.  He is s/p cholecystectomy in the distant past and it was not for gallstones.  He cannot remember the reason for his cholecystectomy.  Past Medical History  Diagnosis Date  . Atrial fibrillation     a. Paroxysmal, not Coumadin candidate 2/2 Parkinson's/gait instability  . PE (pulmonary embolism)     a. >20 years ago;  b. nl V:Q scan 10/2010  . DVT (deep venous thrombosis)   . Meningioma   . BPH (benign prostatic hyperplasia)   . Personal history of colonic polyps   . Venereal wart   . Depression   . Asthma   . Chickenpox   . Thrombophlebitis   . GERD (gastroesophageal reflux disease)   . Parkinson disease   . Dementia   . CAD (coronary artery disease)     a. 08/2009 s/p CABG x 2 (LIMA->LAD, SVG-OM). b. Adm w/ CP 09/2011 - questionable functionality of LIMA graft but patient  declined further catheterization (cath 10/05/11 -- ostial LAD disease, possible flap in prox L subclavian/unable to image LIMA well, atretic SVG-OM)  . Hyperlipidemia   . Stricture and stenosis of esophagus   . Hiatal hernia   . Diverticulosis of colon (without mention of hemorrhage)   . Mitral regurgitation     a. 08/2009 s/p MV repair 22mm Edwards ring and oversewing of LA appendage @ time of CABG;  b. cardiac MRI 09/2011 showing some disruption of lateral annuloplasty ring with "perivalvular" MR and some central MR   . Thrombus of left atrial appendage     a. Abnormal dye staining on cath 09/2011 - imaged by CT/MRI/TEE - felt to be surgically isolated from the LA  . Anxiety   . Pneumonia   . Arthritis     Past Surgical History  Procedure Date  . Inguinal hernia repair   . Tonsilectomy, adenoidectomy, bilateral myringotomy and tubes   . Varicose vein surgery     left leg  . Back surgery     herniated disc & remove bone spurs  . Coronary artery bypass graft   . Mitral valve repair   . Umbilical hernia repair   . External ear surgery   . Tee without cardioversion 10/06/2011    Procedure: TRANSESOPHAGEAL ECHOCARDIOGRAM (TEE);  Surgeon: Deloris Ping Nahser,  MD;  Location: [facility] ENDOSCOPY;  Service: Cardiovascular;  Laterality: N/A;  . Cardiac catheterization     Family History  Problem Relation Age of Onset  . Arthritis    . Diabetes    . Stroke    . Heart disease Mother     Social History:  reports that he has quit smoking. He has never used smokeless tobacco. He reports that he does not drink alcohol or use illicit drugs.  Allergies:  Allergies  Allergen Reactions  . Ibuprofen Other (See Comments)    Stomach hurts    Medications:  Scheduled:   . amiodarone  200 mg Oral Daily  . aspirin EC  81 mg Oral Daily  . carbidopa-levodopa  1 tablet Oral TID WC  . clonazePAM  1 mg Oral Q6H  . enoxaparin (LOVENOX) injection  40 mg Subcutaneous Daily  . heparin  4,000 Units Intravenous  Once  . hydrocortisone cream  1 application Topical BID  . levothyroxine  75 mcg Oral Q breakfast  . multivitamin with minerals  1 tablet Oral Daily  . pantoprazole  40 mg Oral BID WC  . polyethylene glycol  17 g Oral Daily  . risperiDONE  1 mg Oral QHS  . sodium chloride  3 mL Intravenous Q12H  . Tamsulosin HCl  0.4 mg Oral Daily  . venlafaxine XR  75 mg Oral Q breakfast  . DISCONTD: aspirin EC  325 mg Oral Daily  . DISCONTD: aspirin EC  325 mg Oral Daily  . DISCONTD: enoxaparin (LOVENOX) injection  40 mg Subcutaneous Q24H  . DISCONTD: levothyroxine  75 mcg Oral Daily  . DISCONTD: risperiDONE  1 mg Oral QHS   Continuous:   . DISCONTD: heparin 1,500 Units/hr (11/13/11 1821)    Results for orders placed during the hospital encounter of 11/13/11 (from the past 24 hour(s))  CBC WITH DIFFERENTIAL     Status: Abnormal   Collection Time   11/13/11 12:50 PM      Component Value Range   WBC 7.5  4.0 - 10.5 K/uL   RBC 4.30  4.22 - 5.81 MIL/uL   Hemoglobin 10.6 (*) 13.0 - 17.0 g/dL   HCT 16.1 (*) 09.6 - 04.5 %   MCV 80.9  78.0 - 100.0 fL   MCH 24.7 (*) 26.0 - 34.0 pg   MCHC 30.5  30.0 - 36.0 g/dL   RDW 40.9 (*) 81.1 - 91.4 %   Platelets 236  150 - 400 K/uL   Neutrophils Relative 71  43 - 77 %   Neutro Abs 5.3  1.7 - 7.7 K/uL   Lymphocytes Relative 14  12 - 46 %   Lymphs Abs 1.0  0.7 - 4.0 K/uL   Monocytes Relative 8  3 - 12 %   Monocytes Absolute 0.6  0.1 - 1.0 K/uL   Eosinophils Relative 7 (*) 0 - 5 %   Eosinophils Absolute 0.5  0.0 - 0.7 K/uL   Basophils Relative 1  0 - 1 %   Basophils Absolute 0.1  0.0 - 0.1 K/uL  COMPREHENSIVE METABOLIC PANEL     Status: Abnormal   Collection Time   11/13/11 12:50 PM      Component Value Range   Sodium 136  135 - 145 mEq/L   Potassium 4.3  3.5 - 5.1 mEq/L   Chloride 102  96 - 112 mEq/L   CO2 23  19 - 32 mEq/L   Glucose, Bld 105 (*) 70 - 99 mg/dL  BUN 12  6 - 23 mg/dL   Creatinine, Ser 1.61  0.50 - 1.35 mg/dL   Calcium 9.6  8.4 - 09.6  mg/dL   Total Protein 7.5  6.0 - 8.3 g/dL   Albumin 4.0  3.5 - 5.2 g/dL   AST 13  0 - 37 U/L   ALT 9  0 - 53 U/L   Alkaline Phosphatase 76  39 - 117 U/L   Total Bilirubin 0.5  0.3 - 1.2 mg/dL   GFR calc non Af Amer 53 (*) >90 mL/min   GFR calc Af Amer 61 (*) >90 mL/min  POCT I-STAT TROPONIN I     Status: Normal   Collection Time   11/13/11 12:54 PM      Component Value Range   Troponin i, poc 0.02  0.00 - 0.08 ng/mL   Comment 3           COMPREHENSIVE METABOLIC PANEL     Status: Abnormal   Collection Time   11/13/11  4:53 PM      Component Value Range   Sodium 138  135 - 145 mEq/L   Potassium 4.3  3.5 - 5.1 mEq/L   Chloride 104  96 - 112 mEq/L   CO2 25  19 - 32 mEq/L   Glucose, Bld 96  70 - 99 mg/dL   BUN 12  6 - 23 mg/dL   Creatinine, Ser 0.45 (*) 0.50 - 1.35 mg/dL   Calcium 9.2  8.4 - 40.9 mg/dL   Total Protein 6.8  6.0 - 8.3 g/dL   Albumin 3.7  3.5 - 5.2 g/dL   AST 13  0 - 37 U/L   ALT 9  0 - 53 U/L   Alkaline Phosphatase 68  39 - 117 U/L   Total Bilirubin 0.4  0.3 - 1.2 mg/dL   GFR calc non Af Amer 51 (*) >90 mL/min   GFR calc Af Amer 59 (*) >90 mL/min  CBC     Status: Abnormal   Collection Time   11/13/11  4:53 PM      Component Value Range   WBC 6.8  4.0 - 10.5 K/uL   RBC 4.06 (*) 4.22 - 5.81 MIL/uL   Hemoglobin 9.9 (*) 13.0 - 17.0 g/dL   HCT 81.1 (*) 91.4 - 78.2 %   MCV 80.5  78.0 - 100.0 fL   MCH 24.4 (*) 26.0 - 34.0 pg   MCHC 30.3  30.0 - 36.0 g/dL   RDW 95.6 (*) 21.3 - 08.6 %   Platelets 226  150 - 400 K/uL  HEPARIN LEVEL (UNFRACTIONATED)     Status: Normal   Collection Time   11/14/11  2:00 AM      Component Value Range   Heparin Unfractionated 0.45  0.30 - 0.70 IU/mL  CBC     Status: Abnormal   Collection Time   11/14/11  5:30 AM      Component Value Range   WBC 6.6  4.0 - 10.5 K/uL   RBC 3.83 (*) 4.22 - 5.81 MIL/uL   Hemoglobin 9.6 (*) 13.0 - 17.0 g/dL   HCT 57.8 (*) 46.9 - 62.9 %   MCV 80.9  78.0 - 100.0 fL   MCH 25.1 (*) 26.0 - 34.0 pg   MCHC  31.0  30.0 - 36.0 g/dL   RDW 52.8 (*) 41.3 - 24.4 %   Platelets 221  150 - 400 K/uL     Dg Chest 2 View  11/13/2011  *  RADIOLOGY REPORT*  Clinical Data: Chest pain and numbness.  CHEST - 2 VIEW  Comparison: Chest x-ray 11/01/2011.  Findings: Chronic areas of pleural thickening and/or small bilateral pleural effusions are similar to the prior examination. There are some ill-defined opacities throughout the right mid and lower lung, and in the medial aspect of the left buttocks which are unchanged compared to priors, and appear to correspond to areas of scarring or developing rounded atelectasis based on comparison with prior CT of the chest 07/11/2011.  No definite new focal air space disease is identified to strongly suggest acute infection at this time.  Pulmonary vasculature is within normal limits.  Heart size is borderline enlarged with postoperative changes of median sternotomy for CABG and mitral annuloplasty.  IMPRESSION: 1.  The appearance of the chest is similar to prior studies, as above, without definite radiographic evidence of acute cardiopulmonary disease.   Original Report Authenticated By: Florencia Reasons, M.D.     ROS:  As stated above in the HPI otherwise negative.  Blood pressure 107/65, pulse 73, temperature 97.8 F (36.6 C), temperature source Oral, resp. rate 18, height 6\' 3"  (1.905 m), weight 106.595 kg (235 lb), SpO2 94.00%.    PE: Gen: NAD, Alert and Oriented HEENT:  [place]/AT, EOMI Neck: Supple, no LAD Lungs: CTA Bilaterally CV: RRR without M/G/R ABM: Soft, NTND, +BS Ext: No C/C/E  Assessment/Plan: 1) Noncardiac chest pain. 2) Parkinson's disease.   I think the patient has motility issues as the source of his chest pain.  It is mostly responsive to SL NTG and he may end up requiring the medication for his chest pain.  It has been some time since his last EGD and it is not unreasonable to repeat the procedure, however, I will discuss the issue with [facility] in the  AM.  Plan: 1) NPO after midnight for possible EGD.  [facility] will make the final decision.  Dwane Andres D 11/14/2011, 12:09 PM

## 2011-11-14 NOTE — Progress Notes (Signed)
Subjective:  No recurrent pain overnight on IV heparin.  Troponins not done, will reorder.  No EKG changes  Objective:  Vital Signs in the last 24 hours: BP 107/65  Pulse 73  Temp 97.8 F (36.6 C) (Oral)  Resp 18  Ht 6\' 3"  (1.905 m)  Wt 106.595 kg (235 lb)  BMI 29.37 kg/m2  SpO2 94%  Physical Exam: Pleasant WM in NAD Lungs:  Clear to A&P Cardiac:  Regular rhythm, normal S1 and S2, no S3 2/6 blowing systolic murmur Abdomen:  Soft, nontender, no masses Extremities:  No edema present  Weight Filed Weights   11/13/11 1202  Weight: 106.595 kg (235 lb)    Lab Results: Basic Metabolic Panel:  Basename 11/13/11 1653 11/13/11 1250  NA 138 136  K 4.3 4.3  CL 104 102  CO2 25 23  GLUCOSE 96 105*  BUN 12 12  CREATININE 1.36* 1.32   CBC:  Basename 11/14/11 0530 11/13/11 1653 11/13/11 1250  WBC 6.6 6.8 --  NEUTROABS -- -- 5.3  HGB 9.6* 9.9* --  HCT 31.0* 32.7* --  MCV 80.9 80.5 --  PLT 221 226 --   Telemetry: Sinus rhythm  Assessment/Plan:  1. Chest pain precipitated with food.  Recent invasive evaluation not revealing as to cause 2. CAD with prior CABG 3. Prior MV repair with residual leak 4. 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  Rec:  I will stop the heparin and ask for GI to see.  Walk in hall, check trop.     Darden Palmer  MD [facility]  11/14/2011, 9:39 AM

## 2011-11-14 NOTE — Progress Notes (Signed)
ANTICOAGULATION CONSULT NOTE - Follow Up Consult  Pharmacy Consult for heparin Indication: chest pain/ACS  Allergies  Allergen Reactions  . Ibuprofen Other (See Comments)    Stomach hurts    Patient Measurements: Height: 6\' 3"  (190.5 cm) Weight: 235 lb (106.595 kg) IBW/kg (Calculated) : 84.5  Heparin Dosing Weight: 105.9kg  Vital Signs: Temp: 98.4 F (36.9 C) (09/21 1945) Temp src: Oral (09/21 1945) BP: 126/63 mmHg (09/21 1945) Pulse Rate: 80  (09/21 1945)  Labs:  Basename 11/14/11 0200 11/13/11 1653 11/13/11 1250  HGB -- 9.9* 10.6*  HCT -- 32.7* 34.8*  PLT -- 226 236  APTT -- -- --  LABPROT -- -- --  INR -- -- --  HEPARINUNFRC 0.45 -- --  CREATININE -- 1.36* 1.32  CKTOTAL -- -- --  CKMB -- -- --  TROPONINI -- -- --    Estimated Creatinine Clearance: 66.7 ml/min (by C-G formula based on Cr of 1.36).   Medical History: Past Medical History  Diagnosis Date  . Atrial fibrillation     a. Paroxysmal, not Coumadin candidate 2/2 Parkinson's/gait instability  . PE (pulmonary embolism)     a. >20 years ago;  b. nl V:Q scan 10/2010  . DVT (deep venous thrombosis)   . Meningioma   . BPH (benign prostatic hyperplasia)   . Personal history of colonic polyps   . Venereal wart   . Depression   . Asthma   . Chickenpox   . Thrombophlebitis   . GERD (gastroesophageal reflux disease)   . Parkinson disease   . Dementia   . CAD (coronary artery disease)     a. 08/2009 s/p CABG x 2 (LIMA->LAD, SVG-OM). b. Adm w/ CP 09/2011 - questionable functionality of LIMA graft but patient declined further catheterization (cath 10/05/11 -- ostial LAD disease, possible flap in prox L subclavian/unable to image LIMA well, atretic SVG-OM)  . Hyperlipidemia   . Stricture and stenosis of esophagus   . Hiatal hernia   . Diverticulosis of colon (without mention of hemorrhage)   . Mitral regurgitation     a. 08/2009 s/p MV repair 22mm Edwards ring and oversewing of LA appendage @ time of CABG;   b. cardiac MRI 09/2011 showing some disruption of lateral annuloplasty ring with "perivalvular" MR and some central MR   . Thrombus of left atrial appendage     a. Abnormal dye staining on cath 09/2011 - imaged by CT/MRI/TEE - felt to be surgically isolated from the LA  . Anxiety   . Pneumonia   . Arthritis    Assessment: 70 yom with history of CAD w/ CABG on IV heparin for CP. Pt is slightly anemic. Pt is not on any anticoagulation PTA despite history of DVT/PE and afib. Pt is not a coumadin candidate per history.  Heparin level (0.45) is at-goal on 1500 units/hr.   Goal of Therapy:  Heparin level 0.3-0.7 units/ml Monitor platelets by anticoagulation protocol: Yes   Plan:  1. Continue IV heparin at 1500 units/hr. 2. Heparin level to confirm dosing in 8 hours.   [name] 11/14/2011,3:33 AM

## 2011-11-15 ENCOUNTER — Encounter (HOSPITAL_COMMUNITY): Payer: Self-pay | Admitting: Gastroenterology

## 2011-11-15 ENCOUNTER — Encounter (HOSPITAL_COMMUNITY)
Admission: EM | Disposition: A | Discharge status: Home / Self Care | Payer: Self-pay | Source: Home / Self Care | Attending: Cardiology

## 2011-11-15 DIAGNOSIS — R079 Chest pain, unspecified: Secondary | ICD-10-CM

## 2011-11-15 DIAGNOSIS — R072 Precordial pain: Secondary | ICD-10-CM

## 2011-11-15 DIAGNOSIS — R1319 Other dysphagia: Secondary | ICD-10-CM

## 2011-11-15 DIAGNOSIS — R0789 Other chest pain: Secondary | ICD-10-CM

## 2011-11-15 DIAGNOSIS — K219 Gastro-esophageal reflux disease without esophagitis: Secondary | ICD-10-CM

## 2011-11-15 HISTORY — PX: ESOPHAGOGASTRODUODENOSCOPY: SHX5428

## 2011-11-15 LAB — CBC
HCT: 29.9 % — ABNORMAL LOW (ref 39.0–52.0)
Hemoglobin: 9.1 g/dL — ABNORMAL LOW (ref 13.0–17.0)
MCHC: 30.4 g/dL (ref 30.0–36.0)
RDW: 16.3 % — ABNORMAL HIGH (ref 11.5–15.5)
WBC: 6.3 10*3/uL (ref 4.0–10.5)

## 2011-11-15 SURGERY — EGD (ESOPHAGOGASTRODUODENOSCOPY)
Anesthesia: Moderate Sedation

## 2011-11-15 MED ORDER — MIDAZOLAM HCL 5 MG/ML IJ SOLN
INTRAMUSCULAR | Status: AC
  Filled 2011-11-15: qty 2

## 2011-11-15 MED ORDER — MIDAZOLAM HCL 10 MG/2ML IJ SOLN
INTRAMUSCULAR | Status: DC | PRN
  Administered 2011-11-15 (×2): 2 mg via INTRAVENOUS

## 2011-11-15 MED ORDER — SODIUM CHLORIDE 0.9 % IV SOLN: INTRAVENOUS | Status: DC

## 2011-11-15 MED ORDER — FENTANYL CITRATE 0.05 MG/ML IJ SOLN
INTRAMUSCULAR | Status: AC
  Filled 2011-11-15: qty 2

## 2011-11-15 MED ORDER — BUTAMBEN-TETRACAINE-BENZOCAINE 2-2-14 % EX AERO
INHALATION_SPRAY | CUTANEOUS | Status: DC | PRN
  Administered 2011-11-15: 2 via TOPICAL

## 2011-11-15 MED ORDER — ISOSORBIDE MONONITRATE 15 MG HALF TABLET
15.0000 mg | ORAL_TABLET | Freq: Every day | ORAL | Status: DC
  Administered 2011-11-15 – 2011-11-16 (×2): 15 mg via ORAL
  Filled 2011-11-15 (×2): qty 1

## 2011-11-15 MED ORDER — FENTANYL CITRATE 0.05 MG/ML IJ SOLN
INTRAMUSCULAR | Status: DC | PRN
  Administered 2011-11-15 (×2): 25 ug via INTRAVENOUS

## 2011-11-15 NOTE — Progress Notes (Signed)
[facility] Progress Note  Subjective:  Feels good this AM.  One episode of CP last night that was relieved by one SL nitro.  No nausea, vomiting, or abdominal pain.  Objective:  Vital signs in last 24 hours: Temp:  [97.5 F (36.4 C)-98.3 F (36.8 C)] 98.3 F (36.8 C) (09/23 0457) Pulse Rate:  [74-76] 74  (09/23 0812) Resp:  [16-18] 18  (09/23 0812) BP: (90-121)/(51-68) 104/68 mmHg (09/23 0812) SpO2:  [95 %-99 %] 95 % (09/23 0457) Weight:  [231 lb 7.7 oz (105 kg)] 231 lb 7.7 oz (105 kg) (09/23 0500) Last BM Date: 11/13/11 General:   Alert, Well-developed, in NAD. Heart:  Regular rate and rhythm; murmur noted Pulm:  CTAB.  No W/R/R. Abdomen:  Soft, nontender and nondistended.  Normal bowel sounds, without guarding, and without rebound.   Extremities:  Without edema. Neurologic:  Alert and  oriented x4;  grossly normal neurologically. Psych:  Alert and cooperative. Normal mood and affect.  Intake/Output from previous day: 09/22 0701 - 09/23 0700 In: 480 [P.O.:480] Out: 450 [Urine:450]  Lab Results:  Basename 11/15/11 0550 11/14/11 0530 11/13/11 1653  WBC 6.3 6.6 6.8  HGB 9.1* 9.6* 9.9*  HCT 29.9* 31.0* 32.7*  PLT 216 221 226   Assessment / Plan: -Chest pain:  No discernable cardiac cause.  Rule out GI source.  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   -Dysphagia:  Secondary to Parkinson's and suspected motility disorder. -Parkinson's Disease -Anemia:  Chronic with stable Hgb.  Normal MCV.  Likely secondary to chronic disease.  Could check iron studies.  *Will plan for EGD later today.  ASA and enoxaparin have been held for now and will need to be restarted post procedure. *Continue BID PPI.    LOS: 2 days   ZEHR, JESSICA D.  11/15/2011, 8:48 AM  Pager number [phone]   I have taken an interval history, reviewed the chart and examined the patient. I agree with the extender's note, impression and recommendations. Chest pain-R/O esophagitis, ulcer, etc.  Dysphagia related to Parkinson's and suspected underlining motility disorder.  Further evaluation with EGD today.   Venita Lick. Russella Dar MD Clementeen Graham

## 2011-11-15 NOTE — Interval H&P Note (Signed)
History and Physical Interval Note:  11/15/2011 2:28 PM  [name]  has presented today for surgery, with the diagnosis of Noncardiac chest pain  The various methods of treatment have been discussed with the patient and family. After consideration of risks, benefits and other options for treatment, the patient has consented to  Procedure(s) (LRB) with comments: ESOPHAGOGASTRODUODENOSCOPY (EGD) (N/A) as a surgical intervention .  [name] history has been reviewed, patient examined, no change in status, stable for surgery.  I have reviewed the patient's chart and labs.  Questions were answered to the patient's satisfaction.     Venita Lick. Russella Dar MD Clement[facility] Graham

## 2011-11-15 NOTE — Care Management Note (Unsigned)
    Page 1 of 1   11/15/2011     1:25:47 PM   CARE MANAGEMENT NOTE 11/15/2011  Patient:  PAT, SIRES   Account Number:  [id number]  Date Initiated:  11/15/2011  Documentation initiated by:  SIMMONS,Delyla Sandeen  Subjective/Objective Assessment:   ADMITTED WITH CP; LIVES AT HOME WITH WIFE; WAS IPTA.     Action/Plan:   DISCHARGE PLANNING INITIATED.   Anticipated DC Date:  11/16/2011   Anticipated DC Plan:  HOME/SELF CARE      DC Planning Services  CM consult      Choice offered to / List presented to:             Status of service:  In process, will continue to follow Medicare Important Message given?   (If response is "NO", the following Medicare IM given date fields will be blank) Date Medicare IM given:   Date Additional Medicare IM given:    Discharge Disposition:    Per UR Regulation:  Reviewed for med. necessity/level of care/duration of stay  If discussed at Long Length of Stay Meetings, dates discussed:    Comments:  11/15/11  1325  Simrat Kendrick SIMMONS RN, BSN [phone] NCM WILL FOLLOW.

## 2011-11-15 NOTE — Progress Notes (Signed)
Patient Name: [name] Date of Encounter: 11/15/2011   Principal Problem:  *Chest pain, mid sternal Active Problems:  CAD  HYPERTENSION  History of mitral valve repair  HYPERLIPIDEMIA  GERD  CKD (chronic kidney disease), stage III   SUBJECTIVE  Episode of c/p last night around 7p -> relieved with 1 sl NTG.  NPO this AM awaiting GI decision re: EGD.  CURRENT MEDS    . amiodarone  200 mg Oral Daily  . carbidopa-levodopa  1 tablet Oral TID WC  . clonazePAM  1 mg Oral Q6H  . hydrocortisone cream  1 application Topical BID  . levothyroxine  75 mcg Oral Q breakfast  . multivitamin with minerals  1 tablet Oral Daily  . pantoprazole  40 mg Oral BID WC  . polyethylene glycol  17 g Oral Daily  . risperiDONE  1 mg Oral QHS  . sodium chloride  3 mL Intravenous Q12H  . Tamsulosin HCl  0.4 mg Oral Daily  . venlafaxine XR  75 mg Oral Q breakfast  . DISCONTD: aspirin EC  325 mg Oral Daily  . DISCONTD: aspirin EC  81 mg Oral Daily  . DISCONTD: enoxaparin (LOVENOX) injection  40 mg Subcutaneous Daily    OBJECTIVE  Filed Vitals:   11/15/11 0457 11/15/11 0500 11/15/11 0645 11/15/11 0812  BP: 90/51  121/51 104/68  Pulse: 74   74  Temp: 98.3 F (36.8 C)     TempSrc: Oral     Resp: 18   18  Height:      Weight:  231 lb 7.7 oz (105 kg)    SpO2: 95%       Intake/Output Summary (Last 24 hours) at 11/15/11 0923 Last data filed at 11/15/11 0300  Gross per 24 hour  Intake    240 ml  Output    450 ml  Net   -210 ml   Filed Weights   11/13/11 1202 11/15/11 0500  Weight: 235 lb (106.595 kg) 231 lb 7.7 oz (105 kg)    PHYSICAL EXAM  General: Pleasant, NAD.  Resting tremor. Neuro: Alert and oriented X 3. Moves all extremities spontaneously. Psych: Normal affect. HEENT:  Normal  Neck: Supple without bruits or JVD. Lungs:  Resp regular and unlabored, CTA. Heart: RRR no s3, s4.  2/6 syst murmur @ bilat usb & apex. Abdomen: Soft, non-tender, non-distended, BS + x 4.    Extremities: No clubbing, cyanosis or edema. DP/PT/Radials 2+ and equal bilaterally.  Accessory Clinical Findings  CBC  Basename 11/15/11 0550 11/14/11 0530 11/13/11 1250  WBC 6.3 6.6 --  NEUTROABS -- -- 5.3  HGB 9.1* 9.6* --  HCT 29.9* 31.0* --  MCV 79.5 80.9 --  PLT 216 221 --   Basic Metabolic Panel  Basename 11/13/11 1653 11/13/11 1250  NA 138 136  K 4.3 4.3  CL 104 102  CO2 25 23  GLUCOSE 96 105*  BUN 12 12  CREATININE 1.36* 1.32  CALCIUM 9.2 9.6  MG -- --  PHOS -- --   Liver Function Tests  Basename 11/13/11 1653 11/13/11 1250  AST 13 13  ALT 9 9  ALKPHOS 68 76  BILITOT 0.4 0.5  PROT 6.8 7.5  ALBUMIN 3.7 4.0   Cardiac Enzymes  Basename 11/14/11 1504 11/14/11 1001  CKTOTAL -- --  CKMB -- --  CKMBINDEX -- --  TROPONINI <0.30 <0.30   TELE  Rsr, 1st deg avb.  Radiology/Studies  Dg Chest 2 View  11/13/2011  *  RADIOLOGY REPORT*  Clinical Data: Chest pain and numbness.  CHEST - 2 VIEW  Comparison: Chest x-ray 11/01/2011.  Findings: Chronic areas of pleural thickening and/or small bilateral pleural effusions are similar to the prior examination. There are some ill-defined opacities throughout the right mid and lower lung, and in the medial aspect of the left buttocks which are unchanged compared to priors, and appear to correspond to areas of scarring or developing rounded atelectasis based on comparison with prior CT of the chest 07/11/2011.  No definite new focal air space disease is identified to strongly suggest acute infection at this time.  Pulmonary vasculature is within normal limits.  Heart size is borderline enlarged with postoperative changes of median sternotomy for CABG and mitral annuloplasty.  IMPRESSION: 1.  The appearance of the chest is similar to prior studies, as above, without definite radiographic evidence of acute cardiopulmonary disease.   Original Report Authenticated By: Florencia Reasons, M.D.    ASSESSMENT AND PLAN  1.  Midsternal  Chest Pain:  Nitrate responsive chest pain with normal Troponins.  Episode last night occurred approx 1 hr after eating dinner.  Recent caths in August showed medically manageable disease with atretic LIMA (patent LAD) and atretic VG-> OM with nl LCX and RCA flow.  He has been eval by GI and consideration is being given to EGD today.  It should be noted that on initial cath 8/12, pt was felt to have a 90% cleft-like ostial LAD stenosis however on f/u cath and IVUS on 8/28, the LAD was shown to be widely patent after IC ntg.  ? Role of coronary vasospasm as a possible contributor to his nitrate responsive chest pain.  Will add low-dose long-acting nitrate.  His pressure has been soft, in the 90's to low 100's.  2.  GERD:  Appreciate GI input.  Possible EGD today.  Cont PPI.  3.  HTN:  Stable.  4.  Parkinson's:  Cont home meds.  5.  ? UTI:  UA abnl.  Will repeat and check culture.  WBC nl, he has been afebrile and is w/o complaints/dysuria.  6.  Anemia:  Normocytic, hypochromic.  Stable.  7.  Stage III CKD:  Stable.  Signed, Nicolasa Ducking NP  Agree with assessment and plan as noted above.  His pain is low substernal, responds to SL NTG within about 5 minutes. Agree with trial of long-acting nitrate. Exam today reveals no chest wall pain or abdominal tenderness. GI plans to do upper endoscopy today.

## 2011-11-15 NOTE — Op Note (Signed)
[facility] [address] [place], [address]   ENDOSCOPY PROCEDURE REPORT  PATIENT: [name], [name]  MR#: [id number] BIRTHDATE: March 12, 1940 , 71  yrs. old GENDER: Male ENDOSCOPIST: Meryl Dare, MD, [facility]  PROCEDURE DATE:  11/15/2011 PROCEDURE:  EGD, diagnostic ASA CLASS:     Class III INDICATIONS:  chest pain. MEDICATIONS: These medications were titrated to patient response per physician's verbal order, Fentanyl-Detailed 50 mcg IV, and Versed 5 mg IV TOPICAL ANESTHETIC: Cetacaine Spray DESCRIPTION OF PROCEDURE: After the risks benefits and alternatives of the procedure were thoroughly explained, informed consent was obtained.  The Pentax Gastroscope [id number] endoscope was introduced through the mouth and advanced to the second portion of the duodenum. Without limitations.  The instrument was slowly withdrawn as the mucosa was fully examined.   ESOPHAGUS: The mucosa of the esophagus appeared normal.   The distal esophagus was mildly tortuous. STOMACH:  The gastric mucosa was normal with normal gastric folds.  DUODENUM: The duodenal mucosa showed no abnormalities in the bulb and second portion of the duodenum.  Retroflexed views revealed a 4 cm hiatal hernia.     The scope was then withdrawn from the patient and the procedure completed.  COMPLICATIONS: There were no complications.  ENDOSCOPIC IMPRESSION: 1.   EGD appeared normal 2.   Hiatal hernia, 4 cm   RECOMMENDATIONS: 1.  anti-reflux regimen 2.  continue PPI 3.  Office appointment with Dr.Patterson as needed if chest pain continues-consider esophageal manometry and barium esophagram    eSigned:  Meryl Dare, MD, [facility] 11/15/2011 3:03 PM   CC: Sheryn Bison, MD

## 2011-11-16 ENCOUNTER — Encounter (HOSPITAL_COMMUNITY): Payer: Self-pay

## 2011-11-16 ENCOUNTER — Encounter (HOSPITAL_COMMUNITY): Payer: Self-pay | Admitting: Gastroenterology

## 2011-11-16 LAB — CBC
HCT: 29.9 % — ABNORMAL LOW (ref 39.0–52.0)
MCH: 24.2 pg — ABNORMAL LOW (ref 26.0–34.0)
MCV: 80.4 fL (ref 78.0–100.0)
RDW: 16.4 % — ABNORMAL HIGH (ref 11.5–15.5)
WBC: 6 10*3/uL (ref 4.0–10.5)

## 2011-11-16 MED ORDER — ISOSORBIDE MONONITRATE 15 MG HALF TABLET: 15.0000 mg | ORAL_TABLET | Freq: Every day | ORAL | Status: DC

## 2011-11-16 MED ORDER — FERROUS SULFATE 325 (65 FE) MG PO TABS: 325.0000 mg | ORAL_TABLET | Freq: Every day | ORAL | Status: DC

## 2011-11-16 NOTE — Progress Notes (Signed)
Pt ambulated in hallway with wife using RW and RA.  Pt ambulated approximately 300 feet without any distress. Will continue to monitor. Thomas Hoff

## 2011-11-16 NOTE — Progress Notes (Addendum)
Patient Name: [name] Date of Encounter: 11/16/2011   Principal Problem:  *Chest pain, mid sternal Active Problems:  HYPERTENSION  CAD  GERD  History of mitral valve repair  HYPERLIPIDEMIA  CKD (chronic kidney disease), stage III  Other dysphagia   SUBJECTIVE  No further chest pain. EGD yesterday showed 4 cm hiatal hernia. Otherwise normal. Rhythm remains NSR on amiodarone.  CURRENT MEDS    . amiodarone  200 mg Oral Daily  . carbidopa-levodopa  1 tablet Oral TID WC  . clonazePAM  1 mg Oral Q6H  . ferrous sulfate  325 mg Oral Q breakfast  . hydrocortisone cream  1 application Topical BID  . isosorbide mononitrate  15 mg Oral Daily  . levothyroxine  75 mcg Oral Q breakfast  . multivitamin with minerals  1 tablet Oral Daily  . pantoprazole  40 mg Oral BID WC  . polyethylene glycol  17 g Oral Daily  . risperiDONE  1 mg Oral QHS  . sodium chloride  3 mL Intravenous Q12H  . Tamsulosin HCl  0.4 mg Oral Daily  . venlafaxine XR  75 mg Oral Q breakfast    OBJECTIVE  Filed Vitals:   11/15/11 1944 11/16/11 0429 11/16/11 0500 11/16/11 0830  BP: 115/75 98/56  103/62  Pulse: 81 80  81  Temp: 97.5 F (36.4 C) 98 F (36.7 C)  97.4 F (36.3 C)  TempSrc: Oral Oral  Oral  Resp: 18 18  20   Height:      Weight:  230 lb 6.1 oz (104.5 kg) 230 lb 6.1 oz (104.5 kg)   SpO2: 97% 97%  95%    Intake/Output Summary (Last 24 hours) at 11/16/11 1008 Last data filed at 11/16/11 0800  Gross per 24 hour  Intake    573 ml  Output    800 ml  Net   -227 ml   Filed Weights   11/15/11 0500 11/16/11 0429 11/16/11 0500  Weight: 231 lb 7.7 oz (105 kg) 230 lb 6.1 oz (104.5 kg) 230 lb 6.1 oz (104.5 kg)    PHYSICAL EXAM  General: Pleasant, NAD.  Resting tremor. Neuro: Alert and oriented X 3. Moves all extremities spontaneously. Psych: Normal affect. HEENT:  Normal  Neck: Supple without bruits or JVD. Lungs:  Resp regular and unlabored, CTA. Heart: RRR no s3, s4.  2/6 syst  murmur @ bilat usb & apex. Abdomen: Soft, non-tender, non-distended, BS + x 4.  Extremities: No clubbing, cyanosis or edema. DP/PT/Radials 2+ and equal bilaterally.  Accessory Clinical Findings  CBC  Basename 11/16/11 0520 11/15/11 0550 11/13/11 1250  WBC 6.0 6.3 --  NEUTROABS -- -- 5.3  HGB 9.0* 9.1* --  HCT 29.9* 29.9* --  MCV 80.4 79.5 --  PLT 222 216 --   Basic Metabolic Panel  Basename 11/13/11 1653 11/13/11 1250  NA 138 136  K 4.3 4.3  CL 104 102  CO2 25 23  GLUCOSE 96 105*  BUN 12 12  CREATININE 1.36* 1.32  CALCIUM 9.2 9.6  MG -- --  PHOS -- --   Liver Function Tests  Basename 11/13/11 1653 11/13/11 1250  AST 13 13  ALT 9 9  ALKPHOS 68 76  BILITOT 0.4 0.5  PROT 6.8 7.5  ALBUMIN 3.7 4.0   Cardiac Enzymes  Basename 11/14/11 1504 11/14/11 1001  CKTOTAL -- --  CKMB -- --  CKMBINDEX -- --  TROPONINI <0.30 <0.30   TELE  Rsr, 1st deg avb.  Radiology/Studies  Dg Chest 2 View  11/13/2011  *RADIOLOGY REPORT*  Clinical Data: Chest pain and numbness.  CHEST - 2 VIEW  Comparison: Chest x-ray 11/01/2011.  Findings: Chronic areas of pleural thickening and/or small bilateral pleural effusions are similar to the prior examination. There are some ill-defined opacities throughout the right mid and lower lung, and in the medial aspect of the left buttocks which are unchanged compared to priors, and appear to correspond to areas of scarring or developing rounded atelectasis based on comparison with prior CT of the chest 07/11/2011.  No definite new focal air space disease is identified to strongly suggest acute infection at this time.  Pulmonary vasculature is within normal limits.  Heart size is borderline enlarged with postoperative changes of median sternotomy for CABG and mitral annuloplasty.  IMPRESSION: 1.  The appearance of the chest is similar to prior studies, as above, without definite radiographic evidence of acute cardiopulmonary disease.   Original Report  Authenticated By: Florencia Reasons, M.D.    ASSESSMENT AND PLAN  1.  Midsternal Chest Pain:  Nitrate responsive chest pain with normal Troponins.  Episode last night occurred approx 1 hr after eating dinner.  Recent caths in August showed medically manageable disease with atretic LIMA (patent LAD) and atretic VG-> OM with nl LCX and RCA flow.  He has been eval by GI and consideration is being given to EGD today.  It should be noted that on initial cath 8/12, pt was felt to have a 90% cleft-like ostial LAD stenosis however on f/u cath and IVUS on 8/28, the LAD was shown to be widely patent after IC ntg.  ? Role of coronary vasospasm as a possible contributor to his nitrate responsive chest pain.  Tolerating low dose imdur.  2.  GERD:  EGD shows 4 cm hiatal hernia.  Cont PPI. If persistent symptoms Dr. Russella Dar rec that he see Dr. Sheryn Bison for further esophageal studies.  3.  HTN:  Stable.  4.  Parkinson's:  Cont home meds. Outpatient followup with Dr. Vickey Huger. 5.  ? UTI:  UA abnl.  Will repeat and check culture.  WBC nl, he has been afebrile and is w/o complaints/dysuria. Culture still pending.   6.  Anemia:  Normocytic, hypochromic.  Stable. Continue multivitamins.  Follow up with Dr. [name]. 7.  Stage III CKD:  Stable.  Plan home today.  Cardiology followup in 2 weeks with Dr. Jens Som.  Signed,  Cassell Clement MD

## 2011-11-16 NOTE — Discharge Summary (Signed)
Discharge Summary   Patient ID: [name] MRN: [id number], DOB/AGE: 1940-04-14 71 y.o.  Primary MD: Nelwyn Salisbury, MD Primary Cardiologist: Dr. Jens Som in [place] Admit date: 11/13/2011 D/C date:     11/16/2011      Primary Discharge Diagnoses:  1. Midsternal Chest Pain  - No objective evidence of ischemia  - ? GI etiology or coronary vasospasm component   - Initiated on Imdur  2. GERD  - EGD showed 4 cm hiatal hernia, not new  - ? Motility disorder; If cp continues f/u w/ Dr. Jarold Motto for further esophageal studies  3. ?UTI  - (+) leuks on UA, (-) nitrites; asymptomatic   - Awaiting UCx, will need to be followed up   Secondary Discharge Diagnoses:  . Atrial fibrillation     a. Paroxysmal, not Coumadin candidate 2/2 Parkinson's/gait instability  . PE (pulmonary embolism)     a. >20 years ago;  b. nl V:Q scan 10/2010  . DVT (deep venous thrombosis)   . Meningioma   . BPH (benign prostatic hyperplasia)   . Personal history of colonic polyps   . Venereal wart   . Depression   . Asthma   . Chickenpox   . Thrombophlebitis   . Parkinson disease   . Dementia   . CAD (coronary artery disease)     a. 08/2009 s/p CABG x 2 (LIMA->LAD, SVG-OM). b. Adm w/ CP 09/2011 - questionable functionality of LIMA graft but patient declined further catheterization (cath 10/05/11 -- ostial LAD disease, possible flap in prox L subclavian/unable to image LIMA well, atretic SVG-OM)  . Hyperlipidemia   . Stricture and stenosis of esophagus   . Hiatal hernia   . Diverticulosis of colon (without mention of hemorrhage)   . Mitral regurgitation     a. 08/2009 s/p MV repair 22mm Edwards ring and oversewing of LA appendage @ time of CABG;  b. cardiac MRI 09/2011 showing some disruption of lateral annuloplasty ring with "perivalvular" MR and some central MR   . Thrombus of left atrial appendage     a. Abnormal dye staining on cath 09/2011 - imaged by CT/MRI/TEE - felt to be surgically isolated  from the LA  . Anxiety   . Pneumonia   . Arthritis     Allergies Allergies  Allergen Reactions  . Ibuprofen Other (See Comments)    Stomach hurts    Diagnostic Studies/Procedures:   11/15/11 - EGD ESOPHAGUS: The mucosa of the esophagus appeared normal. The distal esophagus was mildly tortuous.  STOMACH: The gastric mucosa was normal with normal gastric folds.  DUODENUM: The duodenal mucosa showed no abnormalities in the bulb and second portion of the duodenum. Retroflexed views revealed a 4 cm hiatal hernia. The scope was then withdrawn from the patient and the procedure completed.  ENDOSCOPIC IMPRESSION:  1. EGD appeared normal  2. Hiatal hernia, 4 cm  RECOMMENDATIONS:  1. anti-reflux regimen  2. continue PPI  3. Office appointment with Dr.Patterson as needed if chest pain continues-consider esophageal manometry and barium esophagram   History of Present Illness: 71 y.o. male w/ the above medical problems who presented to [facility]11 with complaints of chest pain.  Hospital Course: In the ED, EKG revealed NSR with no acute ST/T changes. CXR was without acute cardiopulmonary abnormalities. Labs were significant for Hgb 10.6, otherwise unremarkable CBC/CMET . He was placed on IV heparin and admitted for further evaluation and treatment. Cardiac enzymes were cycled and remained negative. It was  felt his chest pain could be GI in etiology for which GI was consulted and performed EGD revealing hiatal hernia which was not new. GI noted his chest pain was likely secondary to a motility disorder and recommendations were made for follow up with Dr. Jarold Motto if chest pain continues for possible esophageal manometry and barium esophagram. Given that his chest pain was relieved with NTG it was also thought there could be a coronary vasospasm component for which he was placed on Imdur. TCTS evaluated him and recommended consideration for Ranexa if pain persists. He had no further  chest pain on day of discharge.  He was seen and evaluated by Dr. Patty Sermons who felt he was stable for discharge home with plans for follow up as scheduled below.  Discharge Vitals: Blood pressure 103/62, pulse 81, temperature 97.4 F (36.3 C), temperature source Oral, resp. rate 20, height 6\' 3"  (1.905 m), weight 230 lb 6.1 oz (104.5 kg), SpO2 95.00%.  Labs: Component Value Date   WBC 6.0 11/16/2011   HGB 9.0* 11/16/2011   HCT 29.9* 11/16/2011   MCV 80.4 11/16/2011   PLT 222 11/16/2011    Lab 11/13/11 1653  NA 138  K 4.3  CL 104  CO2 25  BUN 12  CREATININE 1.36*  CALCIUM 9.2  PROT 6.8  BILITOT 0.4  ALKPHOS 68  ALT 9  AST 13  GLUCOSE 96   Basename 11/14/11 1504 11/14/11 1001  TROPONINI <0.30 <0.30     11/14/2011 16:25  Color, Urine YELLOW  APPearance CLOUDY (A)  Specific Gravity, Urine 1.012  pH 7.5  Glucose NEGATIVE  Bilirubin Urine NEGATIVE  Ketones, ur NEGATIVE  Protein NEGATIVE  Urobilinogen, UA 0.2  Nitrite NEGATIVE  Leukocytes, UA LARGE (A)  Hgb urine dipstick MODERATE (A)  WBC, UA TOO NUMEROUS TO COUNT  RBC / HPF 3-6  Bacteria, UA FEW (A)     Discharge Medications     Medication List     As of 11/16/2011 12:48 PM    TAKE these medications         amiodarone 200 MG tablet   Commonly known as: PACERONE   Take 1 tablet (200 mg total) by mouth daily.      aspirin EC 325 MG tablet   Take 325 mg by mouth daily.      carbidopa-levodopa 25-100 MG per tablet   Commonly known as: SINEMET IR   Take 1 tablet by mouth 3 (three) times daily. 1 tablet at breakfast and lunch      clonazePAM 1 MG tablet   Commonly known as: KLONOPIN   Take 1 mg by mouth every 6 (six) hours.      desvenlafaxine 50 MG 24 hr tablet   Commonly known as: PRISTIQ   Take 50 mg by mouth every morning.      hydrocortisone cream 1 %   Apply 1 application topically 2 (two) times daily.      isosorbide mononitrate 15 mg Tb24   Commonly known as: IMDUR   Take 0.5 tablets (15 mg  total) by mouth daily.      levothyroxine 75 MCG tablet   Commonly known as: SYNTHROID, LEVOTHROID   Take 75 mcg by mouth daily.      multivitamin with minerals Tabs   Take 1 tablet by mouth daily.      nitroGLYCERIN 0.4 MG SL tablet   Commonly known as: NITROSTAT   Place 0.4 mg under the tongue every 5 (five) minutes as needed. For  chest pain      pantoprazole 40 MG tablet   Commonly known as: PROTONIX   Take 40 mg by mouth 2 (two) times daily.      polyethylene glycol packet   Commonly known as: MIRALAX / GLYCOLAX   Take 17 g by mouth 2 (two) times daily as needed. Can use BID PRN for constipation      pravastatin 40 MG tablet   Commonly known as: PRAVACHOL   Take 40 mg by mouth daily.      promethazine 25 MG tablet   Commonly known as: PHENERGAN   Take 25 mg by mouth every 6 (six) hours as needed. For nausea      risperiDONE 1 MG tablet   Commonly known as: RISPERDAL   Take 1 mg by mouth at bedtime.      Tamsulosin HCl 0.4 MG Caps   Commonly known as: FLOMAX   Take 0.4 mg by mouth daily.      temazepam 30 MG capsule   Commonly known as: RESTORIL   Take 30 mg by mouth at bedtime as needed. For sleep         Disposition   Discharge Orders    Future Appointments: Provider: Department: Dept Phone: Center:   11/24/2011 4:00 PM Lewayne Bunting, MD [facility] [place] [phone] [id number]   01/05/2012 2:00 PM Lewayne Bunting, MD [facility] [place] [phone] [facility]   02/02/2012 2:00 PM Lewayne Bunting, MD [facility] [place] [phone] [facility]     Future Orders Please Complete By Expires   Diet - low sodium heart healthy      Increase activity slowly      Discharge instructions        Follow-up Information    Follow up with Olga Millers, MD. On 11/24/2011. (4:00)    Contact information:   [facility] [address] [place] [address] [phone]      Follow up with Nelwyn Salisbury, MD.   Contact  information:   [address] [place] [address] [phone]           Outstanding Labs/Studies:  1. F/u UCx  Duration of Discharge Encounter: Greater than 30 minutes including physician and PA time.  Signed, Labarron Durnin PA-C 11/16/2011, 12:48 PM

## 2011-11-16 NOTE — Progress Notes (Signed)
Pt/family given discharge instructions, medication lists, follow up appointments, and when to call the doctor.  Pt/family verbalizes understanding. [name]    

## 2011-11-16 NOTE — Progress Notes (Signed)
I received a call from [employer] in [place] concerning script for IMDUR.  Confirmed dosage is 15 mg daily. Thomas Hoff

## 2011-11-16 NOTE — Progress Notes (Signed)
CT Surgery-  Chest pain after CABG, MV repair 2011  Prior cath, echo and EGD reviewed - doubt sig anatomic CAD and would inc dose of IMDUR, consider Ranexa. Sig Parkinsons  also may contribute to his sx's of numbness.

## 2011-11-17 ENCOUNTER — Telehealth: Payer: Self-pay | Admitting: Cardiology

## 2011-11-17 ENCOUNTER — Other Ambulatory Visit: Payer: Self-pay | Admitting: Cardiology

## 2011-11-17 LAB — URINE CULTURE: Colony Count: >=100000

## 2011-11-17 MED ORDER — SULFAMETHOXAZOLE-TRIMETHOPRIM 800-160 MG PO TABS: 1.0000 | ORAL_TABLET | Freq: Two times a day (BID) | ORAL | Status: AC

## 2011-11-17 NOTE — Telephone Encounter (Signed)
Followed up on urine culture which showed E.coli UTI. E-prescribed Bactrim 160/800mg  BID x 10 days and discussed with Mr. Canizalez.   [place], PA-C 11/17/2011 1:57 PM

## 2011-11-18 ENCOUNTER — Telehealth: Payer: Self-pay | Admitting: Cardiology

## 2011-11-18 NOTE — Telephone Encounter (Signed)
Spoke with pt wife, yesterday am she gave the pt the first dose out of hosp of isosorbide. She reports after dinner he began feeling very strange and his eyes were blinking rapidly. She gave him a sl NTG and in about 20 min the symptoms stopped. She has not given him the isosorbide today. She reports her "drug book" listed this as a side effect from isosorbide. She is asking what to do next. Will discuss with dr Jens Som.

## 2011-11-18 NOTE — Telephone Encounter (Signed)
New Problem:    Patient's wife called in because he has had an adverse reaction to the isosorbide mononitrate (IMDUR) 15 mg TB24.  Patient's eyes would blink uncontrollably.  Wife gave him a nitroGLYCERIN (NITROSTAT) 0.4 MG SL tablet and it subsided within .  Please call back.

## 2011-11-18 NOTE — Telephone Encounter (Signed)
Discussed with dr Jens Som, instructed wife not sure symptoms related to isosorbide. She will try giving the med again to the pt tonight. She will call with problems.

## 2011-11-20 ENCOUNTER — Emergency Department (HOSPITAL_COMMUNITY)
Admission: EM | Admit: 2011-11-20 | Discharge: 2011-11-20 | Disposition: A | Discharge status: Home / Self Care | Payer: Medicare Other | Source: Home / Self Care | Attending: Family Medicine | Admitting: Family Medicine

## 2011-11-20 ENCOUNTER — Encounter (HOSPITAL_COMMUNITY): Payer: Self-pay

## 2011-11-20 DIAGNOSIS — K5901 Slow transit constipation: Secondary | ICD-10-CM

## 2011-11-20 MED ORDER — BISACODYL 5 MG PO TBEC: 5.0000 mg | DELAYED_RELEASE_TABLET | Freq: Two times a day (BID) | ORAL | Status: DC

## 2011-11-20 NOTE — ED Notes (Signed)
States he has not had a BM since released from hospital on Monday , in spite of using Miralax, lots of fresh friut

## 2011-11-20 NOTE — ED Provider Notes (Signed)
History     CSN: [id number]  Arrival date & time 11/20/11  1033   First MD Initiated Contact with Patient 11/20/11 1054      Chief Complaint  Patient presents with  . Constipation    (Consider location/radiation/quality/duration/timing/severity/associated sxs/prior treatment) Patient is a 71 y.o. male presenting with constipation. The history is provided by the patient and the spouse.  Constipation  The current episode started 3 to 5 days ago (recent hosp d/c .no bm since mon.). The problem has been unchanged. The patient is experiencing no pain. Associated symptoms include abdominal pain. Pertinent negatives include no nausea and no vomiting.    Past Medical History  Diagnosis Date  . Atrial fibrillation     a. Paroxysmal, not Coumadin candidate 2/2 Parkinson's/gait instability  . PE (pulmonary embolism)     a. >20 years ago;  b. nl V:Q scan 10/2010  . DVT (deep venous thrombosis)   . Meningioma   . BPH (benign prostatic hyperplasia)   . Personal history of colonic polyps   . Venereal wart   . Depression   . Asthma   . Chickenpox   . Thrombophlebitis   . GERD (gastroesophageal reflux disease)   . Parkinson disease   . Dementia   . CAD (coronary artery disease)     a. 08/2009 s/p CABG x 2 (LIMA->LAD, SVG-OM). b. Adm w/ CP 09/2011 - questionable functionality of LIMA graft but patient declined further catheterization (cath 10/05/11 -- ostial LAD disease, possible flap in prox L subclavian/unable to image LIMA well, atretic SVG-OM)  . Hyperlipidemia   . Stricture and stenosis of esophagus   . Hiatal hernia   . Diverticulosis of colon (without mention of hemorrhage)   . Mitral regurgitation     a. 08/2009 s/p MV repair 22mm Edwards ring and oversewing of LA appendage @ time of CABG;  b. cardiac MRI 09/2011 showing some disruption of lateral annuloplasty ring with "perivalvular" MR and some central MR   . Thrombus of left atrial appendage     a. Abnormal dye staining on cath  09/2011 - imaged by CT/MRI/TEE - felt to be surgically isolated from the LA  . Anxiety   . Pneumonia   . Arthritis     Past Surgical History  Procedure Date  . Inguinal hernia repair   . Tonsilectomy, adenoidectomy, bilateral myringotomy and tubes   . Varicose vein surgery     left leg  . Back surgery     herniated disc & remove bone spurs  . Coronary artery bypass graft   . Mitral valve repair   . Umbilical hernia repair   . External ear surgery   . Tee without cardioversion 10/06/2011    Procedure: TRANSESOPHAGEAL ECHOCARDIOGRAM (TEE);  Surgeon: Vesta Mixer, MD;  Location: [facility] ENDOSCOPY;  Service: Cardiovascular;  Laterality: N/A;  . Cardiac catheterization   . Esophagogastroduodenoscopy 11/15/2011    Procedure: ESOPHAGOGASTRODUODENOSCOPY (EGD);  Surgeon: Meryl Dare, MD,FACG;  Location: [facility] ENDOSCOPY;  Service: Endoscopy;  Laterality: N/A;    Family History  Problem Relation Age of Onset  . Arthritis    . Diabetes    . Stroke    . Heart disease Mother     History  Substance Use Topics  . Smoking status: Former Games developer  . Smokeless tobacco: Never Used  . Alcohol Use: No      Review of Systems  Constitutional: Negative.   Gastrointestinal: Positive for abdominal pain, constipation, blood in stool and anal bleeding. Negative  for nausea and vomiting.       Blood noticed on tip of stool from last bm , none since.    Allergies  Ibuprofen  Home Medications   Current Outpatient Rx  Name Route Sig Dispense Refill  . AMIODARONE HCL 200 MG PO TABS Oral Take 1 tablet (200 mg total) by mouth daily.    . ASPIRIN EC 325 MG PO TBEC Oral Take 325 mg by mouth daily.    Marland Kitchen BISACODYL 5 MG PO TBEC Oral Take 1 tablet (5 mg total) by mouth 2 (two) times daily. 14 tablet 0  . CARBIDOPA-LEVODOPA 25-100 MG PO TABS Oral Take 1 tablet by mouth 3 (three) times daily. 1 tablet at breakfast and lunch    . CLONAZEPAM 1 MG PO TABS Oral Take 1 mg by mouth every 6 (six) hours.    .  DESVENLAFAXINE SUCCINATE ER 50 MG PO TB24 Oral Take 50 mg by mouth every morning.     Marland Kitchen HYDROCORTISONE 1 % EX CREA Topical Apply 1 application topically 2 (two) times daily.    . ISOSORBIDE MONONITRATE 15 MG HALF TABLET Oral Take 0.5 tablets (15 mg total) by mouth daily. 30 tablet 6  . LEVOTHYROXINE SODIUM 75 MCG PO TABS Oral Take 75 mcg by mouth daily.    . ADULT MULTIVITAMIN W/MINERALS CH Oral Take 1 tablet by mouth daily.    Marland Kitchen NITROGLYCERIN 0.4 MG SL SUBL Sublingual Place 0.4 mg under the tongue every 5 (five) minutes as needed. For chest pain    . PANTOPRAZOLE SODIUM 40 MG PO TBEC Oral Take 40 mg by mouth 2 (two) times daily.    Marland Kitchen POLYETHYLENE GLYCOL 3350 PO PACK Oral Take 17 g by mouth 2 (two) times daily as needed. Can use BID PRN for constipation    . PRAVASTATIN SODIUM 40 MG PO TABS Oral Take 40 mg by mouth daily.    Marland Kitchen PROMETHAZINE HCL 25 MG PO TABS Oral Take 25 mg by mouth every 6 (six) hours as needed. For nausea    . RISPERIDONE 1 MG PO TABS Oral Take 1 mg by mouth at bedtime.    . SULFAMETHOXAZOLE-TRIMETHOPRIM 800-160 MG PO TABS Oral Take 1 tablet by mouth 2 (two) times daily. 20 tablet 0  . TAMSULOSIN HCL 0.4 MG PO CAPS Oral Take 0.4 mg by mouth daily.    Marland Kitchen TEMAZEPAM 30 MG PO CAPS Oral Take 30 mg by mouth at bedtime as needed. For sleep      BP 115/66  Pulse 76  Temp 98.4 F (36.9 C) (Oral)  Resp 18  SpO2 99%  Physical Exam  Nursing note and vitals reviewed. Constitutional: He is oriented to person, place, and time. He appears well-developed and well-nourished.  Abdominal: Soft. Bowel sounds are normal. He exhibits no distension and no mass. There is no tenderness. There is no rebound and no guarding.  Genitourinary: Rectum normal and prostate normal. Guaiac negative stool.       No stool in rectum, prostate neg, no impaction.  Neurological: He is alert and oriented to person, place, and time.    ED Course  Procedures (including critical care time)  Labs Reviewed - No  data to display No results found.   1. Constipation, slow transit       MDM          Linna Hoff, MD 11/20/11 1114

## 2011-11-24 ENCOUNTER — Encounter: Payer: Medicare Other | Admitting: Cardiology

## 2011-11-26 ENCOUNTER — Encounter (HOSPITAL_COMMUNITY): Payer: Self-pay | Admitting: *Deleted

## 2011-11-26 ENCOUNTER — Emergency Department (HOSPITAL_COMMUNITY): Payer: Medicare Other

## 2011-11-26 ENCOUNTER — Emergency Department (HOSPITAL_COMMUNITY)
Admission: EM | Admit: 2011-11-26 | Discharge: 2011-11-27 | Disposition: A | Discharge status: Home / Self Care | Payer: Medicare Other | Attending: Emergency Medicine | Admitting: Emergency Medicine

## 2011-11-26 DIAGNOSIS — I2510 Atherosclerotic heart disease of native coronary artery without angina pectoris: Secondary | ICD-10-CM | POA: Insufficient documentation

## 2011-11-26 DIAGNOSIS — D649 Anemia, unspecified: Secondary | ICD-10-CM

## 2011-11-26 DIAGNOSIS — G20A1 Parkinson's disease without dyskinesia, without mention of fluctuations: Secondary | ICD-10-CM | POA: Insufficient documentation

## 2011-11-26 DIAGNOSIS — G20 Parkinson's disease: Secondary | ICD-10-CM | POA: Insufficient documentation

## 2011-11-26 DIAGNOSIS — K5900 Constipation, unspecified: Secondary | ICD-10-CM

## 2011-11-26 DIAGNOSIS — N289 Disorder of kidney and ureter, unspecified: Secondary | ICD-10-CM

## 2011-11-26 LAB — CBC WITH DIFFERENTIAL/PLATELET
Basophils Relative: 1 % (ref 0–1)
Eosinophils Absolute: 0.9 10*3/uL — ABNORMAL HIGH (ref 0.0–0.7)
HCT: 29.7 % — ABNORMAL LOW (ref 39.0–52.0)
Hemoglobin: 9.2 g/dL — ABNORMAL LOW (ref 13.0–17.0)
MCH: 24.5 pg — ABNORMAL LOW (ref 26.0–34.0)
MCHC: 31 g/dL (ref 30.0–36.0)
Monocytes Absolute: 0.7 10*3/uL (ref 0.1–1.0)
Monocytes Relative: 9 % (ref 3–12)

## 2011-11-26 LAB — URINALYSIS, ROUTINE W REFLEX MICROSCOPIC
Glucose, UA: NEGATIVE mg/dL
Leukocytes, UA: NEGATIVE
Protein, ur: NEGATIVE mg/dL
pH: 6.5 (ref 5.0–8.0)

## 2011-11-26 LAB — COMPREHENSIVE METABOLIC PANEL
Albumin: 3.6 g/dL (ref 3.5–5.2)
BUN: 10 mg/dL (ref 6–23)
Calcium: 9 mg/dL (ref 8.4–10.5)
Creatinine, Ser: 1.64 mg/dL — ABNORMAL HIGH (ref 0.50–1.35)
Total Protein: 6.7 g/dL (ref 6.0–8.3)

## 2011-11-26 MED ORDER — SODIUM CHLORIDE 0.9 % IV BOLUS (SEPSIS)
500.0000 mL | Freq: Once | INTRAVENOUS | Status: AC
  Administered 2011-11-26: 500 mL via INTRAVENOUS

## 2011-11-26 NOTE — ED Notes (Signed)
Pt in via EMS, per EMS- pt in c/o constipation over last few weeks, pt seen for same on 9/28 at urgent care and given enema and told to take miralax at home with no relief. Pt states his last BM was when patient was d/c'd from hospital on 9/24. Pt abd distended and c/o increased abd pain.

## 2011-11-26 NOTE — ED Notes (Signed)
Patient transported to X-ray 

## 2011-11-26 NOTE — ED Provider Notes (Signed)
History     CSN: [id number]  Arrival date & time 11/26/11  2006   First MD Initiated Contact with Patient 11/26/11 2314      Chief Complaint  Patient presents with  . Constipation  . Abdominal Pain    (Consider location/radiation/quality/duration/timing/severity/associated sxs/prior treatment) HPI Pt presents with c/o abdominal pain.  Pt has had constipation and decreased stool output over the past approx 1-2 weeks since recent hospitalization for chest pain.  Has been seen at urgent care as well as [facility] ED x 2.  Wife states he was started on dulcolax which produced no stool.  Then at [facility] was given enema which produced small amount of stool and patient felt somewhat improved several days ago.  Over the past 2-3 days he has had decreased appetite, no further stools- was seen at [facility] again today and given miralax- pt took several doses with gatorade this evening.  Periumbilical abdominal pain worsened tonight and patient had one episode of nonbilious/nonbloody emesis tonight.  No fever/chills.  No difficulty urinating or dysuria.  Currently states abdominal pain has improved, but wife states he was in significant pain earlier tonight.  Wife does state he passed a small amount of stool tonight.  Hx of appendectomy, no hx of constipation.  There are no other associated systemic symptoms, there are no other alleviating or modifying factors.  Past Medical History  Diagnosis Date  . Atrial fibrillation     a. Paroxysmal, not Coumadin candidate 2/2 Parkinson's/gait instability  . PE (pulmonary embolism)     a. >20 years ago;  b. nl V:Q scan 10/2010  . DVT (deep venous thrombosis)   . Meningioma   . BPH (benign prostatic hyperplasia)   . Personal history of colonic polyps   . Venereal wart   . Depression   . Asthma   . Chickenpox   . Thrombophlebitis   . GERD (gastroesophageal reflux disease)   . Parkinson disease   . Dementia   . CAD (coronary artery disease)    a. 08/2009 s/p CABG x 2 (LIMA->LAD, SVG-OM). b. Adm w/ CP 09/2011 - questionable functionality of LIMA graft but patient declined further catheterization (cath 10/05/11 -- ostial LAD disease, possible flap in prox L subclavian/unable to image LIMA well, atretic SVG-OM)  . Hyperlipidemia   . Stricture and stenosis of esophagus   . Hiatal hernia   . Diverticulosis of colon (without mention of hemorrhage)   . Mitral regurgitation     a. 08/2009 s/p MV repair 22mm Edwards ring and oversewing of LA appendage @ time of CABG;  b. cardiac MRI 09/2011 showing some disruption of lateral annuloplasty ring with "perivalvular" MR and some central MR   . Thrombus of left atrial appendage     a. Abnormal dye staining on cath 09/2011 - imaged by CT/MRI/TEE - felt to be surgically isolated from the LA  . Anxiety   . Pneumonia   . Arthritis     Past Surgical History  Procedure Date  . Inguinal hernia repair   . Tonsilectomy, adenoidectomy, bilateral myringotomy and tubes   . Varicose vein surgery     left leg  . Back surgery     herniated disc & remove bone spurs  . Coronary artery bypass graft   . Mitral valve repair   . Umbilical hernia repair   . External ear surgery   . Tee without cardioversion 10/06/2011    Procedure: TRANSESOPHAGEAL ECHOCARDIOGRAM (TEE);  Surgeon: Vesta Mixer, MD;  Location: [facility]  ENDOSCOPY;  Service: Cardiovascular;  Laterality: N/A;  . Cardiac catheterization   . Esophagogastroduodenoscopy 11/15/2011    Procedure: ESOPHAGOGASTRODUODENOSCOPY (EGD);  Surgeon: Meryl Dare, MD,FACG;  Location: [facility] ENDOSCOPY;  Service: Endoscopy;  Laterality: N/A;    Family History  Problem Relation Age of Onset  . Arthritis    . Diabetes    . Stroke    . Heart disease Mother     History  Substance Use Topics  . Smoking status: Former Games developer  . Smokeless tobacco: Never Used  . Alcohol Use: No      Review of Systems ROS reviewed and all otherwise negative except for mentioned in  HPI  Allergies  Ibuprofen  Home Medications   Current Outpatient Rx  Name Route Sig Dispense Refill  . AMIODARONE HCL 200 MG PO TABS Oral Take 1 tablet (200 mg total) by mouth daily.    . ASPIRIN EC 325 MG PO TBEC Oral Take 325 mg by mouth daily.    Marland Kitchen CARBIDOPA-LEVODOPA 25-100 MG PO TABS Oral Take 1 tablet by mouth 3 (three) times daily.     Marland Kitchen CLONAZEPAM 1 MG PO TABS Oral Take 1 mg by mouth every 6 (six) hours.    . DESVENLAFAXINE SUCCINATE ER 50 MG PO TB24 Oral Take 50 mg by mouth every morning.     Marland Kitchen HYDROCORTISONE 1 % EX CREA Topical Apply 1 application topically 2 (two) times daily as needed. For skin irritation when sweating    . ISOSORBIDE MONONITRATE 15 MG HALF TABLET Oral Take 7.5 mg by mouth daily.    Marland Kitchen LEVOTHYROXINE SODIUM 75 MCG PO TABS Oral Take 75 mcg by mouth daily.    . ADULT MULTIVITAMIN W/MINERALS CH Oral Take 1 tablet by mouth daily.    Marland Kitchen NITROGLYCERIN 0.4 MG SL SUBL Sublingual Place 0.4 mg under the tongue every 5 (five) minutes as needed. For chest pain    . PANTOPRAZOLE SODIUM 40 MG PO TBEC Oral Take 40 mg by mouth 2 (two) times daily.    Marland Kitchen POLYETHYLENE GLYCOL 3350 PO PACK Oral Take 17 g by mouth 4 (four) times daily.     Marland Kitchen PRAVASTATIN SODIUM 40 MG PO TABS Oral Take 40 mg by mouth at bedtime.     Marland Kitchen PROMETHAZINE HCL 25 MG PO TABS Oral Take 25 mg by mouth every 6 (six) hours as needed. For nausea    . RISPERIDONE 1 MG PO TABS Oral Take 1 mg by mouth at bedtime.    . SULFAMETHOXAZOLE-TRIMETHOPRIM 800-160 MG PO TABS Oral Take 1 tablet by mouth 2 (two) times daily. 20 tablet 0  . TAMSULOSIN HCL 0.4 MG PO CAPS Oral Take 0.4 mg by mouth every morning.     Marland Kitchen TEMAZEPAM 30 MG PO CAPS Oral Take 30 mg by mouth at bedtime as needed. For sleep      BP 141/83  Pulse 81  Temp 98.7 F (37.1 C) (Oral)  Resp 17  SpO2 96% Vitals reviewed Physical Exam Physical Examination: General appearance - alert, well appearing, and in no distress Mental status - alert, oriented to person,  place, and time Eyes - pupils equal and reactive, no conjunctival injection, no scleral icterus Mouth - mucous membranes moist, pharynx normal without lesions Chest - clear to auscultation, no wheezes, rales or rhonchi, symmetric air entry Heart - normal rate, regular rhythm, normal S1, S2, no murmurs, rubs, clicks or gallops Abdomen - soft, mildly distended, nontender, NABS, no masses or organomegaly Extremities - peripheral pulses  normal, no pedal edema, no clubbing or cyanosis Skin - normal coloration and turgor, no rashes  ED Course  Procedures (including critical care time)  3:07 AM  Pt has had multiple large BMS in ED.  CT scan shows no acute process- no signs of SBO.    Labs Reviewed  CBC WITH DIFFERENTIAL - Abnormal; Notable for the following:    RBC 3.76 (*)     Hemoglobin 9.2 (*)     HCT 29.7 (*)     MCH 24.5 (*)     RDW 16.7 (*)     Eosinophils Relative 11 (*)     Eosinophils Absolute 0.9 (*)     All other components within normal limits  COMPREHENSIVE METABOLIC PANEL - Abnormal; Notable for the following:    Glucose, Bld 112 (*)     Creatinine, Ser 1.64 (*)     Total Bilirubin 0.2 (*)     GFR calc non Af Amer 41 (*)     GFR calc Af Amer 47 (*)     All other components within normal limits  URINALYSIS, ROUTINE W REFLEX MICROSCOPIC - Abnormal; Notable for the following:    APPearance CLOUDY (*)     All other components within normal limits   Ct Abdomen Pelvis W Contrast  11/27/2011  *RADIOLOGY REPORT*  Clinical Data: Abdominal pain, constipation  CT ABDOMEN AND PELVIS WITH CONTRAST  Technique:  Multidetector CT imaging of the abdomen and pelvis was performed following the standard protocol during bolus administration of intravenous contrast.  Contrast: 80mL OMNIPAQUE IOHEXOL 300 MG/ML  SOLN  Comparison: 10/05/2011  Findings: Small bilateral pleural effusions, right greater than left.  Previous median sternotomy.  Some patchy consolidation / atelectasis posteriorly in the  visualized lung bases, right greater than left, stable.  Unremarkable liver, spleen.  Stable 11 mm left adrenal nodule.  Mild diffuse pancreatic parenchymal atrophy. Bilateral nephrolithiasis, largest on the left in the lower pole 4 mm, on the right in the upper pole 2 mm.  No hydronephrosis. Hyperdense partially exophytic lesion in the upper pole right kidney stable.  No hydronephrosis or ureterectasis.  Stomach is nondilated.  There is a small hiatal hernia.  Small bowel nondistended, with incomplete distal passage of the oral contrast material.  Normal appendix.  Fluid distends the colon proximally. There are scattered sigmoid diverticula without adjacent inflammatory/edematous change.  Urinary bladder incompletely distended.  There is marked prostatic enlargement with central coarse calcifications.  Right inguinal hernia contains only fat. No ascites.  No free air.  Patchy aortoiliac arterial calcifications.  Portal vein patent.  No adenopathy localized. Spondylitic changes in the lumbar spine.  IMPRESSION:  1. No acute abdominal process. 2.  Sigmoid diverticulosis. 3.  Stable small pleural effusions. 4.  Bilateral nephrolithiasis without hydronephrosis. 5.  Prostatic enlargement   Original Report Authenticated By: Osa Craver, M.D.    Dg Abd Acute W/chest  11/26/2011  *RADIOLOGY REPORT*  Clinical Data: Abdominal pain and constipation.  ACUTE ABDOMEN SERIES (ABDOMEN 2 VIEW & CHEST 1 VIEW)  Comparison: Previous examinations.  Findings: Poor inspiration with increased linear density at the right lung base and medial left lung base.  Borderline enlarged cardiac silhouette with stable median sternotomy wires, post CABG changes and prosthetic heart valve.  Paucity of intestinal gas.  Multiple air-fluid levels in normal- caliber bowel loops.  No free peritoneal air.  Mildly prominent stool in the left colon and sigmoid colon.  Unremarkable bones.  IMPRESSION:  1.  Multiple  air-fluid levels in  normal-caliber bowel loops.  This can be seen with gastroenteritis.  This could be an indication of early changes of ileus or bowel obstruction as well. 2.  Poor inspiration with mild bibasilar atelectasis.   Original Report Authenticated By: Darrol Angel, M.D.      1. Constipation   2. Renal insufficiency   3. Anemia       MDM  Pt presenting with c/o abdominal pain and constipation since recent hospitalization.  Acute abdominal series showed fluid levels, CT scan showed no acute abnormality- no signs of obstruction, volvulus or other emergent process.  While in ED patient passed multiple formed stools.  No abdominal pain, no vomiting in ED.  Creat slightly above baseline- will need to be rechecked with PMD, anemia at recent baseline.  Discharged with strict return precautions.  Pt agreeable with plan.        Ethelda Chick, MD 11/27/11 [phone]

## 2011-11-27 ENCOUNTER — Emergency Department (HOSPITAL_COMMUNITY): Payer: Medicare Other

## 2011-11-27 ENCOUNTER — Encounter (HOSPITAL_COMMUNITY): Payer: Self-pay | Admitting: Radiology

## 2011-11-27 MED ORDER — IOHEXOL 300 MG/ML  SOLN
20.0000 mL | INTRAMUSCULAR | Status: AC
  Administered 2011-11-27: 20 mL via ORAL

## 2011-11-27 MED ORDER — IOHEXOL 300 MG/ML  SOLN
80.0000 mL | Freq: Once | INTRAMUSCULAR | Status: AC | PRN
  Administered 2011-11-27: 80 mL via INTRAVENOUS

## 2011-11-27 NOTE — ED Notes (Signed)
Pt in Ct now

## 2011-11-27 NOTE — ED Notes (Signed)
Pt returned to room  

## 2011-12-07 ENCOUNTER — Telehealth: Payer: Self-pay | Admitting: Family Medicine

## 2011-12-07 NOTE — Telephone Encounter (Signed)
I spoke with pt and gave the below advise. Pt declined that, but he will schedule a office visit to come in and see Dr. [name].

## 2011-12-07 NOTE — Telephone Encounter (Signed)
I suggest they go to the ER

## 2011-12-07 NOTE — Telephone Encounter (Signed)
Caller: Ronda/Spouse; Patient Name: [name]; PCP: Gershon Crane [facility]); Best Callback Phone Number: [phone]; Reason for call: Constipation.  Pt has extensive history with constipation being treated at [facility] and [facility] ED.  Caller reports pt has been seen 5 times at one or the other within the past month. Pt was recently hospitalized for the constipation (10/4).   Caller has been giving Miralax to pt and pt  was able to have BM last night that was more like diarrhea.  Caller states, " Pt is weak and unable to walk without assistance now".    Emergent symptom of 'New onset of generalized weakness' identified in the Weakness or Paralysis Protocol.  No office visit found immediately with Dr [name].  OFFICE PLEASE FOLLOW UP IF PT CAN BE SEEN IMMEDIATELY IN THE OFFICE OR IF PT NEEDS TO GO ON TO ED.

## 2011-12-08 ENCOUNTER — Ambulatory Visit: Payer: Medicare Other | Admitting: Family Medicine

## 2011-12-09 ENCOUNTER — Telehealth: Payer: Self-pay | Admitting: Family Medicine

## 2011-12-09 NOTE — Telephone Encounter (Signed)
Caller: Ronda/Patient; Phone: [phone]; Reason for Call: They need a some type of order for therapist and RN to come to their house, needs to find out why he can not sleep, he does not even take a nap anymore, can you request info from the visit to [facility] ED visit .  .  .  [phone] .  .  .  Gotten wobbly again, getting worse instead of better, Spouse definetly needs some help

## 2011-12-10 ENCOUNTER — Telehealth: Payer: Self-pay | Admitting: Family Medicine

## 2011-12-10 NOTE — Telephone Encounter (Signed)
What he needs is a Therapist, sports to deal with his chronic depression and sleep issues. This is beyond what I can handle. No need for an RN visit to the home. He does not need a referral to see a Psychiatrist, they can set this up themselves. If he does not know of one, I can suggest a few who specialize in geriatric patients

## 2011-12-10 NOTE — Telephone Encounter (Signed)
I spoke with [place]

## 2011-12-10 NOTE — Telephone Encounter (Signed)
First tell them to talk to their pharmacist to see what of his meds he can crush to take with liquids or to sprinkle on food. Second, call Genevieve Norlander for a home visit to assess for help with bathing, dressing, medication management, etc.

## 2011-12-10 NOTE — Telephone Encounter (Signed)
Pts wife called and is req status of getting a referral for in home care. Pt is having problems swallowing meds. Pls call asap today.

## 2011-12-10 NOTE — Telephone Encounter (Signed)
[name] called re: order to home health care. Can [facility] start home visit on Tues  12/14/11? Pls call asap.

## 2011-12-10 NOTE — Telephone Encounter (Signed)
I spoke with pt's wife and went over the below information. I also spoke with [place] and faxed over the order to assess. Phone # [phone] and Fax # [phone].

## 2011-12-16 ENCOUNTER — Telehealth: Payer: Self-pay | Admitting: Family Medicine

## 2011-12-16 MED ORDER — ISOSORBIDE MONONITRATE 15 MG HALF TABLET: 7.5000 mg | ORAL_TABLET | Freq: Every day | ORAL | Status: DC

## 2011-12-16 MED ORDER — CLONAZEPAM 1 MG PO TABS: 1.0000 mg | ORAL_TABLET | Freq: Four times a day (QID) | ORAL | Status: DC

## 2011-12-16 NOTE — Telephone Encounter (Signed)
Call in Imdur to take 1/2 of tab daily, #30 with 11 rf. Also Clonazepam #120 with 5 rf

## 2011-12-16 NOTE — Telephone Encounter (Signed)
isosorbide mononitrate (IMDUR) 15 mg TB24 [[id number]] . Please assist as patient is out of these meds now please see previous message as well. These need to be sent to [facility] in [place]. Ph. [phone].

## 2011-12-16 NOTE — Telephone Encounter (Signed)
Patient's spouse called stating that she has been trying to get his meds refilled since Monday. Patient needs refills of Klonipin and

## 2011-12-16 NOTE — Telephone Encounter (Signed)
I called in both scripts and tried to reach pt, no answer.

## 2011-12-17 ENCOUNTER — Telehealth: Payer: Self-pay | Admitting: Family Medicine

## 2011-12-17 DIAGNOSIS — R531 Weakness: Secondary | ICD-10-CM

## 2011-12-17 DIAGNOSIS — G20 Parkinson's disease: Secondary | ICD-10-CM

## 2011-12-17 DIAGNOSIS — G20A1 Parkinson's disease without dyskinesia, without mention of fluctuations: Secondary | ICD-10-CM

## 2011-12-17 DIAGNOSIS — R2681 Unsteadiness on feet: Secondary | ICD-10-CM

## 2011-12-17 NOTE — Telephone Encounter (Signed)
Call [place] and order PT to come to the home to assist with weakness, unstable gait, and falling risk. Have the wife contact [facility] or another program for possible transportation help

## 2011-12-17 NOTE — Telephone Encounter (Signed)
Spoke with Bjorn Loser - gave dr. Claris Che instructions - order done for referral

## 2011-12-17 NOTE — Telephone Encounter (Signed)
Caller: Ronda/Spouse; Patient Name: [name]; PCP: Gershon Crane [facility]); Best Callback Phone Number: [phone]; Reason for call: referral request.  Pt has Parkinsons disease.  Caller states pt is having more difficulty walking with walker and getting up and down from chair.  Caller wants to know if Dr [name] will ok at Physical Therapist to come to the home and she is also requesting help with transportation to and from doctors appt.  Caller does report she has a home health nurse Genevieve Norlander) that comes to the home.  Caller declined symptoms to triage; she states she just needs some help caring for pt.  OFFICE PLEASE FOLLOW UP WITH PATIENT REGARDING PT REFERRAL AND TRANSPORTATION ASSISTANCE FOR PT.

## 2011-12-20 ENCOUNTER — Telehealth: Payer: Self-pay | Admitting: Family Medicine

## 2011-12-20 NOTE — Telephone Encounter (Signed)
[name] called and said that pt has been set up for services.

## 2011-12-21 ENCOUNTER — Telehealth: Payer: Self-pay | Admitting: Family Medicine

## 2011-12-21 NOTE — Telephone Encounter (Signed)
Patient needs a refill of Pristiq. Please advise.

## 2011-12-21 NOTE — Telephone Encounter (Signed)
Call in a one year supply  

## 2011-12-22 ENCOUNTER — Encounter (HOSPITAL_COMMUNITY): Payer: Self-pay

## 2011-12-22 ENCOUNTER — Observation Stay (HOSPITAL_COMMUNITY)
Admission: EM | Admit: 2011-12-22 | Discharge: 2011-12-24 | Disposition: A | Discharge status: Home / Self Care | Payer: Medicare Other | Attending: Family Medicine | Admitting: Family Medicine

## 2011-12-22 ENCOUNTER — Emergency Department (HOSPITAL_COMMUNITY): Payer: Medicare Other

## 2011-12-22 DIAGNOSIS — J189 Pneumonia, unspecified organism: Secondary | ICD-10-CM

## 2011-12-22 DIAGNOSIS — I5040 Unspecified combined systolic (congestive) and diastolic (congestive) heart failure: Secondary | ICD-10-CM | POA: Diagnosis present

## 2011-12-22 DIAGNOSIS — R1319 Other dysphagia: Secondary | ICD-10-CM

## 2011-12-22 DIAGNOSIS — I4891 Unspecified atrial fibrillation: Secondary | ICD-10-CM | POA: Diagnosis present

## 2011-12-22 DIAGNOSIS — Z860100 Personal history of colon polyps, unspecified: Secondary | ICD-10-CM

## 2011-12-22 DIAGNOSIS — R0789 Other chest pain: Secondary | ICD-10-CM

## 2011-12-22 DIAGNOSIS — I2510 Atherosclerotic heart disease of native coronary artery without angina pectoris: Secondary | ICD-10-CM | POA: Diagnosis present

## 2011-12-22 DIAGNOSIS — J45909 Unspecified asthma, uncomplicated: Secondary | ICD-10-CM | POA: Diagnosis present

## 2011-12-22 DIAGNOSIS — Z87898 Personal history of other specified conditions: Secondary | ICD-10-CM | POA: Diagnosis present

## 2011-12-22 DIAGNOSIS — I951 Orthostatic hypotension: Secondary | ICD-10-CM

## 2011-12-22 DIAGNOSIS — J309 Allergic rhinitis, unspecified: Secondary | ICD-10-CM

## 2011-12-22 DIAGNOSIS — K5900 Constipation, unspecified: Secondary | ICD-10-CM | POA: Insufficient documentation

## 2011-12-22 DIAGNOSIS — K219 Gastro-esophageal reflux disease without esophagitis: Secondary | ICD-10-CM

## 2011-12-22 DIAGNOSIS — F068 Other specified mental disorders due to known physiological condition: Secondary | ICD-10-CM | POA: Diagnosis present

## 2011-12-22 DIAGNOSIS — R531 Weakness: Secondary | ICD-10-CM

## 2011-12-22 DIAGNOSIS — Z86718 Personal history of other venous thrombosis and embolism: Secondary | ICD-10-CM | POA: Insufficient documentation

## 2011-12-22 DIAGNOSIS — N390 Urinary tract infection, site not specified: Secondary | ICD-10-CM

## 2011-12-22 DIAGNOSIS — R269 Unspecified abnormalities of gait and mobility: Secondary | ICD-10-CM | POA: Insufficient documentation

## 2011-12-22 DIAGNOSIS — F329 Major depressive disorder, single episode, unspecified: Secondary | ICD-10-CM | POA: Diagnosis present

## 2011-12-22 DIAGNOSIS — E039 Hypothyroidism, unspecified: Secondary | ICD-10-CM | POA: Insufficient documentation

## 2011-12-22 DIAGNOSIS — I10 Essential (primary) hypertension: Secondary | ICD-10-CM

## 2011-12-22 DIAGNOSIS — N183 Chronic kidney disease, stage 3 unspecified: Secondary | ICD-10-CM | POA: Insufficient documentation

## 2011-12-22 DIAGNOSIS — N400 Enlarged prostate without lower urinary tract symptoms: Secondary | ICD-10-CM | POA: Insufficient documentation

## 2011-12-22 DIAGNOSIS — I513 Intracardiac thrombosis, not elsewhere classified: Secondary | ICD-10-CM

## 2011-12-22 DIAGNOSIS — D320 Benign neoplasm of cerebral meninges: Secondary | ICD-10-CM

## 2011-12-22 DIAGNOSIS — I809 Phlebitis and thrombophlebitis of unspecified site: Secondary | ICD-10-CM

## 2011-12-22 DIAGNOSIS — I129 Hypertensive chronic kidney disease with stage 1 through stage 4 chronic kidney disease, or unspecified chronic kidney disease: Secondary | ICD-10-CM | POA: Insufficient documentation

## 2011-12-22 DIAGNOSIS — Z954 Presence of other heart-valve replacement: Secondary | ICD-10-CM | POA: Insufficient documentation

## 2011-12-22 DIAGNOSIS — I200 Unstable angina: Secondary | ICD-10-CM

## 2011-12-22 DIAGNOSIS — F3289 Other specified depressive episodes: Secondary | ICD-10-CM | POA: Diagnosis present

## 2011-12-22 DIAGNOSIS — R32 Unspecified urinary incontinence: Secondary | ICD-10-CM | POA: Insufficient documentation

## 2011-12-22 DIAGNOSIS — J690 Pneumonitis due to inhalation of food and vomit: Principal | ICD-10-CM

## 2011-12-22 DIAGNOSIS — Z79899 Other long term (current) drug therapy: Secondary | ICD-10-CM | POA: Insufficient documentation

## 2011-12-22 DIAGNOSIS — E785 Hyperlipidemia, unspecified: Secondary | ICD-10-CM | POA: Insufficient documentation

## 2011-12-22 DIAGNOSIS — Z9889 Other specified postprocedural states: Secondary | ICD-10-CM

## 2011-12-22 DIAGNOSIS — F0280 Dementia in other diseases classified elsewhere without behavioral disturbance: Secondary | ICD-10-CM | POA: Insufficient documentation

## 2011-12-22 DIAGNOSIS — I509 Heart failure, unspecified: Secondary | ICD-10-CM | POA: Insufficient documentation

## 2011-12-22 DIAGNOSIS — I82409 Acute embolism and thrombosis of unspecified deep veins of unspecified lower extremity: Secondary | ICD-10-CM | POA: Diagnosis present

## 2011-12-22 DIAGNOSIS — G20 Parkinson's disease: Secondary | ICD-10-CM

## 2011-12-22 HISTORY — DX: Cerebral infarction, unspecified: I63.9

## 2011-12-22 HISTORY — DX: Neoplasm of unspecified behavior of brain: D49.6

## 2011-12-22 HISTORY — DX: Pneumonitis due to inhalation of food and vomit: J69.0

## 2011-12-22 HISTORY — DX: Other pulmonary embolism without acute cor pulmonale: I26.99

## 2011-12-22 LAB — T4, FREE: Free T4: 1.64 ng/dL (ref 0.80–1.80)

## 2011-12-22 LAB — URINALYSIS, ROUTINE W REFLEX MICROSCOPIC
Glucose, UA: NEGATIVE mg/dL
Hgb urine dipstick: NEGATIVE
Specific Gravity, Urine: 1.023 (ref 1.005–1.030)
Urobilinogen, UA: 1 mg/dL (ref 0.0–1.0)

## 2011-12-22 LAB — CBC WITH DIFFERENTIAL/PLATELET
Basophils Absolute: 0.1 10*3/uL (ref 0.0–0.1)
Basophils Relative: 1 % (ref 0–1)
Eosinophils Relative: 3 % (ref 0–5)
HCT: 34 % — ABNORMAL LOW (ref 39.0–52.0)
MCH: 24 pg — ABNORMAL LOW (ref 26.0–34.0)
MCHC: 30.3 g/dL (ref 30.0–36.0)
MCV: 79.1 fL (ref 78.0–100.0)
Monocytes Absolute: 0.6 10*3/uL (ref 0.1–1.0)
RDW: 16.4 % — ABNORMAL HIGH (ref 11.5–15.5)

## 2011-12-22 LAB — CBC
HCT: 31.4 % — ABNORMAL LOW (ref 39.0–52.0)
MCH: 23.8 pg — ABNORMAL LOW (ref 26.0–34.0)
MCHC: 30.3 g/dL (ref 30.0–36.0)
MCV: 78.7 fL (ref 78.0–100.0)
RDW: 16.3 % — ABNORMAL HIGH (ref 11.5–15.5)

## 2011-12-22 LAB — TSH: TSH: 2.056 u[IU]/mL (ref 0.350–4.500)

## 2011-12-22 LAB — COMPREHENSIVE METABOLIC PANEL
AST: 13 U/L (ref 0–37)
Albumin: 3.7 g/dL (ref 3.5–5.2)
Calcium: 9.1 mg/dL (ref 8.4–10.5)
Creatinine, Ser: 1.38 mg/dL — ABNORMAL HIGH (ref 0.50–1.35)
GFR calc non Af Amer: 50 mL/min — ABNORMAL LOW (ref >90)

## 2011-12-22 LAB — URINE MICROSCOPIC-ADD ON

## 2011-12-22 MED ORDER — RISPERIDONE 1 MG PO TABS
1.0000 mg | ORAL_TABLET | Freq: Every day | ORAL | Status: DC
  Administered 2011-12-23: 1 mg via ORAL
  Filled 2011-12-22 (×3): qty 1

## 2011-12-22 MED ORDER — TEMAZEPAM 15 MG PO CAPS
30.0000 mg | ORAL_CAPSULE | Freq: Every day | ORAL | Status: DC
  Administered 2011-12-23: 30 mg via ORAL
  Filled 2011-12-22: qty 2

## 2011-12-22 MED ORDER — VANCOMYCIN HCL 1000 MG IV SOLR
1500.0000 mg | INTRAVENOUS | Status: AC
  Administered 2011-12-22: 1500 mg via INTRAVENOUS
  Filled 2011-12-22: qty 1500

## 2011-12-22 MED ORDER — ISOSORBIDE DINITRATE 10 MG PO TABS
10.0000 mg | ORAL_TABLET | Freq: Every day | ORAL | Status: DC
  Administered 2011-12-23 – 2011-12-24 (×2): 10 mg via ORAL
  Filled 2011-12-22 (×3): qty 1

## 2011-12-22 MED ORDER — PIPERACILLIN-TAZOBACTAM 3.375 G IVPB
3.3750 g | Freq: Once | INTRAVENOUS | Status: AC
  Administered 2011-12-22: 3.375 g via INTRAVENOUS
  Filled 2011-12-22: qty 50

## 2011-12-22 MED ORDER — VANCOMYCIN HCL IN DEXTROSE 1-5 GM/200ML-% IV SOLN
1000.0000 mg | Freq: Two times a day (BID) | INTRAVENOUS | Status: DC
  Administered 2011-12-23 (×2): 1000 mg via INTRAVENOUS
  Filled 2011-12-22 (×3): qty 200

## 2011-12-22 MED ORDER — LEVOTHYROXINE SODIUM 75 MCG PO TABS
75.0000 ug | ORAL_TABLET | Freq: Every day | ORAL | Status: DC
  Administered 2011-12-24: 75 ug via ORAL
  Filled 2011-12-22 (×3): qty 1

## 2011-12-22 MED ORDER — LEVOFLOXACIN IN D5W 750 MG/150ML IV SOLN
750.0000 mg | INTRAVENOUS | Status: DC
  Administered 2011-12-23: 750 mg via INTRAVENOUS
  Filled 2011-12-22 (×2): qty 150

## 2011-12-22 MED ORDER — PANTOPRAZOLE SODIUM 40 MG PO TBEC
40.0000 mg | DELAYED_RELEASE_TABLET | Freq: Two times a day (BID) | ORAL | Status: DC
  Administered 2011-12-23 – 2011-12-24 (×3): 40 mg via ORAL
  Filled 2011-12-22 (×3): qty 1

## 2011-12-22 MED ORDER — TAMSULOSIN HCL 0.4 MG PO CAPS
0.4000 mg | ORAL_CAPSULE | Freq: Every morning | ORAL | Status: DC
  Administered 2011-12-23 – 2011-12-24 (×2): 0.4 mg via ORAL
  Filled 2011-12-22 (×2): qty 1

## 2011-12-22 MED ORDER — LACTULOSE 10 GM/15ML PO SOLN
10.0000 g | Freq: Every day | ORAL | Status: DC
  Administered 2011-12-23: 10 g via ORAL
  Filled 2011-12-22 (×3): qty 15

## 2011-12-22 MED ORDER — FUROSEMIDE 10 MG/ML IJ SOLN
40.0000 mg | Freq: Once | INTRAMUSCULAR | Status: AC
  Administered 2011-12-23: 40 mg via INTRAVENOUS
  Filled 2011-12-22: qty 4

## 2011-12-22 MED ORDER — CEFEPIME HCL 1 G IJ SOLR
1.0000 g | Freq: Three times a day (TID) | INTRAMUSCULAR | Status: DC
  Administered 2011-12-22 – 2011-12-24 (×5): 1 g via INTRAVENOUS
  Filled 2011-12-22 (×7): qty 1

## 2011-12-22 MED ORDER — HEPARIN SODIUM (PORCINE) 5000 UNIT/ML IJ SOLN
5000.0000 [IU] | Freq: Three times a day (TID) | INTRAMUSCULAR | Status: DC
  Administered 2011-12-22 – 2011-12-24 (×5): 5000 [IU] via SUBCUTANEOUS
  Filled 2011-12-22 (×8): qty 1

## 2011-12-22 MED ORDER — DESVENLAFAXINE SUCCINATE ER 50 MG PO TB24: 50.0000 mg | ORAL_TABLET | Freq: Every morning | ORAL | Status: DC

## 2011-12-22 MED ORDER — POLYETHYLENE GLYCOL 3350 17 G PO PACK
17.0000 g | PACK | Freq: Four times a day (QID) | ORAL | Status: DC
  Administered 2011-12-23 – 2011-12-24 (×3): 17 g via ORAL
  Filled 2011-12-22 (×9): qty 1

## 2011-12-22 MED ORDER — SIMVASTATIN 10 MG PO TABS
10.0000 mg | ORAL_TABLET | Freq: Every day | ORAL | Status: DC
  Administered 2011-12-23: 10 mg via ORAL
  Filled 2011-12-22 (×2): qty 1

## 2011-12-22 MED ORDER — AMIODARONE HCL 200 MG PO TABS
200.0000 mg | ORAL_TABLET | Freq: Every day | ORAL | Status: DC
  Administered 2011-12-23 – 2011-12-24 (×2): 200 mg via ORAL
  Filled 2011-12-22 (×3): qty 1

## 2011-12-22 MED ORDER — ASPIRIN EC 325 MG PO TBEC
325.0000 mg | DELAYED_RELEASE_TABLET | Freq: Every day | ORAL | Status: DC
  Administered 2011-12-23 – 2011-12-24 (×2): 325 mg via ORAL
  Filled 2011-12-22 (×3): qty 1

## 2011-12-22 MED ORDER — BISACODYL 10 MG RE SUPP: 10.0000 mg | Freq: Every day | RECTAL | Status: DC | PRN

## 2011-12-22 MED ORDER — CLONAZEPAM 1 MG PO TABS
1.0000 mg | ORAL_TABLET | Freq: Four times a day (QID) | ORAL | Status: DC
  Administered 2011-12-23 – 2011-12-24 (×5): 1 mg via ORAL
  Filled 2011-12-22 (×5): qty 1

## 2011-12-22 MED ORDER — CARBIDOPA-LEVODOPA 25-100 MG PO TABS
1.0000 | ORAL_TABLET | Freq: Three times a day (TID) | ORAL | Status: DC
  Administered 2011-12-23 – 2011-12-24 (×4): 1 via ORAL
  Filled 2011-12-22 (×7): qty 1

## 2011-12-22 MED ORDER — ADULT MULTIVITAMIN W/MINERALS CH
1.0000 | ORAL_TABLET | Freq: Every day | ORAL | Status: DC
  Administered 2011-12-23 – 2011-12-24 (×2): 1 via ORAL
  Filled 2011-12-22 (×3): qty 1

## 2011-12-22 MED ORDER — SODIUM CHLORIDE 0.9 % IV BOLUS (SEPSIS)
500.0000 mL | Freq: Once | INTRAVENOUS | Status: AC
  Administered 2011-12-22: 500 mL via INTRAVENOUS

## 2011-12-22 NOTE — ED Notes (Signed)
Per family and patient request IV team has been paged and called for PIV.

## 2011-12-22 NOTE — Progress Notes (Signed)
ANTIBIOTIC CONSULT NOTE - INITIAL  Pharmacy Consult for Vancomycin Indication: rule out pneumonia  Allergies  Allergen Reactions  . Ibuprofen Other (See Comments)    Stomach hurts    Patient Measurements:   Vital Signs: Temp: 97.9 F (36.6 C) (10/30 1622) Temp src: Oral (10/30 1622) BP: 119/73 mmHg (10/30 2000) Pulse Rate: 75  (10/30 2000) Intake/Output from previous day:   Intake/Output from this shift:    Labs:  Basename 12/22/11 1655  WBC 7.3  HGB 10.3*  PLT 218  LABCREA --  CREATININE 1.38*   Estimated Creatinine Clearance: 65.4 ml/min (by C-G formula based on Cr of 1.38). No results found for this basename: VANCOTROUGH:2,VANCOPEAK:2,VANCORANDOM:2,GENTTROUGH:2,GENTPEAK:2,GENTRANDOM:2,TOBRATROUGH:2,TOBRAPEAK:2,TOBRARND:2,AMIKACINPEAK:2,AMIKACINTROU:2,AMIKACIN:2, in the last 72 hours   Microbiology: No results found for this or any previous visit (from the past 720 hour(s)).  Medical History: Past Medical History  Diagnosis Date  . Atrial fibrillation     a. Paroxysmal, not Coumadin candidate 2/2 Parkinson's/gait instability  . PE (pulmonary embolism)     a. >20 years ago;  b. nl V:Q scan 10/2010  . DVT (deep venous thrombosis)   . Meningioma   . BPH (benign prostatic hyperplasia)   . Personal history of colonic polyps   . Venereal wart   . Depression   . Asthma   . Chickenpox   . Thrombophlebitis   . GERD (gastroesophageal reflux disease)   . Parkinson disease   . Dementia   . CAD (coronary artery disease)     a. 08/2009 s/p CABG x 2 (LIMA->LAD, SVG-OM). b. Adm w/ CP 09/2011 - questionable functionality of LIMA graft but patient declined further catheterization (cath 10/05/11 -- ostial LAD disease, possible flap in prox L subclavian/unable to image LIMA well, atretic SVG-OM)  . Hyperlipidemia   . Stricture and stenosis of esophagus   . Hiatal hernia   . Diverticulosis of colon (without mention of hemorrhage)   . Mitral regurgitation     a. 08/2009 s/p  MV repair 22mm Edwards ring and oversewing of LA appendage @ time of CABG;  b. cardiac MRI 09/2011 showing some disruption of lateral annuloplasty ring with "perivalvular" MR and some central MR   . Thrombus of left atrial appendage     a. Abnormal dye staining on cath 09/2011 - imaged by CT/MRI/TEE - felt to be surgically isolated from the LA  . Anxiety   . Pneumonia   . Arthritis     Medications:  Scheduled:    . amiodarone  200 mg Oral Daily  . aspirin EC  325 mg Oral Daily  . carbidopa-levodopa  1 tablet Oral TID  . ceFEPime (MAXIPIME) IV  1 g Intravenous Q8H  . clonazePAM  1 mg Oral Q6H  . furosemide  40 mg Intravenous Once  . heparin  5,000 Units Subcutaneous Q8H  . isosorbide dinitrate  10 mg Oral Daily  . lactulose  10 g Oral Daily  . levofloxacin (LEVAQUIN) IV  750 mg Intravenous Q24H  . levothyroxine  75 mcg Oral QAC breakfast  . multivitamin with minerals  1 tablet Oral Daily  . pantoprazole  40 mg Oral BID  . piperacillin-tazobactam (ZOSYN)  IV  3.375 g Intravenous Once  . polyethylene glycol  17 g Oral QID  . risperiDONE  1 mg Oral QHS  . simvastatin  10 mg Oral q1800  . sodium chloride  500 mL Intravenous Once  . Tamsulosin HCl  0.4 mg Oral q morning - 10a  . temazepam  30 mg Oral QHS  .  vancomycin  1,500 mg Intravenous To ER   Assessment: 71 yr old male on cefepime and vancomycin for suspected pneumonia.    Goal of Therapy:  Vancomycin trough level 15-20 mcg/ml  Plan:  Continue Cefepime 1 gram IV q8hrs. Start Vancomycin 1000mg  IV q12hrs Follow up culture results  Wendie Simmer, PharmD, BCPS Clinical Pharmacist  Pager: [phone]

## 2011-12-22 NOTE — ED Notes (Signed)
MD at bedside.  Hospitalist in room with patient and wife.
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
## 2011-12-22 NOTE — ED Notes (Signed)
C/o generalized weakness x 1 week, worsened over the last two day.  Recently completed antibiotics for UTI.

## 2011-12-22 NOTE — ED Provider Notes (Signed)
History     CSN: [id number]  Arrival date & time      First MD Initiated Contact with Patient 12/22/11 1620      Chief Complaint  Patient presents with  . Fatigue    (Consider location/radiation/quality/duration/timing/severity/associated sxs/prior treatment) HPI Comments: Presents with one week of gradually worsening generalized weakness. Per EMS the spouse states that he has been much worse for the past 2 days. The patient states he is felt this way for 1 day. He also states he has had a poor appetite. Of note he was recently treated for urinary tract infection (cx c/w Ecoli 11/15/11). Denies any recent falls.  Patient is a 71 y.o. male presenting with weakness and general illness. The history is provided by the patient and the EMS personnel. No language interpreter was used.  Weakness Primary symptoms do not include headaches, syncope, loss of consciousness, altered mental status, seizures, dizziness, visual change, paresthesias, loss of sensation, speech change, memory loss, fever, nausea or vomiting. Focal weakness: generalized. The symptoms began 5 to 7 days ago. The episode lasted 7 days. The symptoms are worsening. The neurological symptoms are diffuse.  Additional symptoms include weakness. Additional symptoms do not include neck stiffness, pain or loss of balance. Medical issues also include hypertension. Medical issues do not include recent surgery.  Illness  The current episode started 5 to 7 days ago. The onset was gradual. The problem occurs continuously. The problem has been gradually worsening. The problem is moderate. Nothing relieves the symptoms. Nothing aggravates the symptoms. Pertinent negatives include no fever, no abdominal pain, no constipation, no diarrhea, no nausea, no vomiting, no headaches, no sore throat, no neck pain, no cough and no eye discharge.    Past Medical History  Diagnosis Date  . Atrial fibrillation     a. Paroxysmal, not Coumadin candidate 2/2  Parkinson's/gait instability  . PE (pulmonary embolism)     a. >20 years ago;  b. nl V:Q scan 10/2010  . DVT (deep venous thrombosis)   . Meningioma   . BPH (benign prostatic hyperplasia)   . Personal history of colonic polyps   . Venereal wart   . Depression   . Asthma   . Chickenpox   . Thrombophlebitis   . GERD (gastroesophageal reflux disease)   . Parkinson disease   . Dementia   . CAD (coronary artery disease)     a. 08/2009 s/p CABG x 2 (LIMA->LAD, SVG-OM). b. Adm w/ CP 09/2011 - questionable functionality of LIMA graft but patient declined further catheterization (cath 10/05/11 -- ostial LAD disease, possible flap in prox L subclavian/unable to image LIMA well, atretic SVG-OM)  . Hyperlipidemia   . Stricture and stenosis of esophagus   . Hiatal hernia   . Diverticulosis of colon (without mention of hemorrhage)   . Mitral regurgitation     a. 08/2009 s/p MV repair 22mm Edwards ring and oversewing of LA appendage @ time of CABG;  b. cardiac MRI 09/2011 showing some disruption of lateral annuloplasty ring with "perivalvular" MR and some central MR   . Thrombus of left atrial appendage     a. Abnormal dye staining on cath 09/2011 - imaged by CT/MRI/TEE - felt to be surgically isolated from the LA  . Anxiety   . Pneumonia   . Arthritis     Past Surgical History  Procedure Date  . Inguinal hernia repair   . Tonsilectomy, adenoidectomy, bilateral myringotomy and tubes   . Varicose vein surgery  left leg  . Back surgery     herniated disc & remove bone spurs  . Coronary artery bypass graft   . Mitral valve repair   . Umbilical hernia repair   . External ear surgery   . Tee without cardioversion 10/06/2011    Procedure: TRANSESOPHAGEAL ECHOCARDIOGRAM (TEE);  Surgeon: Vesta Mixer, MD;  Location: [facility] ENDOSCOPY;  Service: Cardiovascular;  Laterality: N/A;  . Cardiac catheterization   . Esophagogastroduodenoscopy 11/15/2011    Procedure: ESOPHAGOGASTRODUODENOSCOPY (EGD);   Surgeon: Meryl Dare, MD,FACG;  Location: [facility] ENDOSCOPY;  Service: Endoscopy;  Laterality: N/A;    Family History  Problem Relation Age of Onset  . Arthritis    . Diabetes    . Stroke    . Heart disease Mother     History  Substance Use Topics  . Smoking status: Former Games developer  . Smokeless tobacco: Never Used  . Alcohol Use: No      Review of Systems  Constitutional: Positive for activity change (decr) and appetite change (decr). Negative for fever and diaphoresis.  HENT: Negative for sore throat, neck pain and neck stiffness.   Eyes: Negative for discharge and visual disturbance.  Respiratory: Negative for cough, choking and shortness of breath.   Cardiovascular: Negative for chest pain, leg swelling and syncope.  Gastrointestinal: Negative for nausea, vomiting, abdominal pain, diarrhea and constipation.  Genitourinary: Negative for dysuria and difficulty urinating.  Musculoskeletal: Negative for back pain and arthralgias.  Skin: Negative for color change and pallor.  Neurological: Positive for tremors (increased from baseline parkinsons) and weakness. Negative for dizziness, speech change, seizures, loss of consciousness, speech difficulty, light-headedness, headaches, paresthesias and loss of balance. Focal weakness: generalized.  Psychiatric/Behavioral: Negative for memory loss, behavioral problems, agitation and altered mental status.  All other systems reviewed and are negative.    Allergies  Ibuprofen  Home Medications   Current Outpatient Rx  Name Route Sig Dispense Refill  . AMIODARONE HCL 200 MG PO TABS Oral Take 1 tablet (200 mg total) by mouth daily.    . ASPIRIN EC 325 MG PO TBEC Oral Take 325 mg by mouth daily.    Marland Kitchen CARBIDOPA-LEVODOPA 25-100 MG PO TABS Oral Take 1 tablet by mouth 3 (three) times daily.     Marland Kitchen CLONAZEPAM 1 MG PO TABS Oral Take 1 tablet (1 mg total) by mouth every 6 (six) hours. 120 tablet 5  . DESVENLAFAXINE SUCCINATE ER 50 MG PO TB24 Oral  Take 1 tablet (50 mg total) by mouth every morning. 30 tablet 11  . HYDROCORTISONE 1 % EX CREA Topical Apply 1 application topically 2 (two) times daily as needed. For skin irritation when sweating    . ISOSORBIDE MONONITRATE 15 MG HALF TABLET Oral Take 0.5 tablets (15 mg total) by mouth daily. 30 tablet 11  . LEVOTHYROXINE SODIUM 75 MCG PO TABS Oral Take 75 mcg by mouth daily.    . ADULT MULTIVITAMIN W/MINERALS CH Oral Take 1 tablet by mouth daily.    Marland Kitchen NITROGLYCERIN 0.4 MG SL SUBL Sublingual Place 0.4 mg under the tongue every 5 (five) minutes as needed. For chest pain    . PANTOPRAZOLE SODIUM 40 MG PO TBEC Oral Take 40 mg by mouth 2 (two) times daily.    Marland Kitchen POLYETHYLENE GLYCOL 3350 PO PACK Oral Take 17 g by mouth 4 (four) times daily.     Marland Kitchen PRAVASTATIN SODIUM 40 MG PO TABS Oral Take 40 mg by mouth at bedtime.     Marland Kitchen  PROMETHAZINE HCL 25 MG PO TABS Oral Take 25 mg by mouth every 6 (six) hours as needed. For nausea    . RISPERIDONE 1 MG PO TABS Oral Take 1 mg by mouth at bedtime.    . TAMSULOSIN HCL 0.4 MG PO CAPS Oral Take 0.4 mg by mouth every morning.     Marland Kitchen TEMAZEPAM 30 MG PO CAPS Oral Take 30 mg by mouth at bedtime as needed. For sleep      There were no vitals taken for this visit.  Physical Exam  Constitutional: He is oriented to person, place, and time. He appears well-developed. No distress.  HENT:  Head: Normocephalic and atraumatic.  Mouth/Throat: No oropharyngeal exudate.  Eyes: EOM are normal. Pupils are equal, round, and reactive to light. Right eye exhibits no discharge. Left eye exhibits no discharge.  Neck: Normal range of motion. Neck supple. No JVD present.  Cardiovascular: Normal rate and regular rhythm.   Murmur (2/6 holosys over mitral region) heard. Pulmonary/Chest: Effort normal and breath sounds normal. No stridor. No respiratory distress. He has no wheezes. He has no rales. He exhibits no tenderness.  Abdominal: Soft. Bowel sounds are normal. There is no tenderness.  There is no guarding.  Genitourinary: Penis normal.  Musculoskeletal: Normal range of motion. He exhibits no edema and no tenderness.  Neurological: He is alert and oriented to person, place, and time. He has normal reflexes. He displays normal reflexes. No cranial nerve deficit. He exhibits normal muscle tone. Coordination (resting tremor BUE, decreased w/ intention such as finger to nose. nml finger to nose testing) abnormal.  Skin: Skin is warm and dry. He is not diaphoretic. No erythema. No pallor.  Psychiatric: He has a normal mood and affect. His behavior is normal. Judgment and thought content normal.    ED Course  Procedures (including critical care time)  Labs Reviewed  CBC WITH DIFFERENTIAL - Abnormal; Notable for the following:    Hemoglobin 10.3 (*)     HCT 34.0 (*)     MCH 24.0 (*)     RDW 16.4 (*)     All other components within normal limits  COMPREHENSIVE METABOLIC PANEL - Abnormal; Notable for the following:    Creatinine, Ser 1.38 (*)     GFR calc non Af Amer 50 (*)     GFR calc Af Amer 58 (*)     All other components within normal limits  URINALYSIS, ROUTINE W REFLEX MICROSCOPIC - Abnormal; Notable for the following:    APPearance CLOUDY (*)     Leukocytes, UA LARGE (*)     All other components within normal limits  URINE MICROSCOPIC-ADD ON - Abnormal; Notable for the following:    Bacteria, UA FEW (*)     Casts HYALINE CASTS (*)  GRANULAR CAST   All other components within normal limits  CBC - Abnormal; Notable for the following:    RBC 3.99 (*)     Hemoglobin 9.5 (*)     HCT 31.4 (*)     MCH 23.8 (*)     RDW 16.3 (*)     All other components within normal limits  CREATININE, SERUM - Abnormal; Notable for the following:    Creatinine, Ser 1.37 (*)     GFR calc non Af Amer 51 (*)     GFR calc Af Amer 59 (*)     All other components within normal limits  TSH  T4, FREE  TROPONIN I  URINE CULTURE  HIV  ANTIBODY (ROUTINE TESTING)  CULTURE, BLOOD (ROUTINE  X 2)  CULTURE, BLOOD (ROUTINE X 2)  GRAM STAIN  LEGIONELLA ANTIGEN, URINE  STREP PNEUMONIAE URINARY ANTIGEN  CULTURE, EXPECTORATED SPUTUM-ASSESSMENT  COMPREHENSIVE METABOLIC PANEL  CBC  MRSA PCR SCREENING  MRSA PCR SCREENING   Dg Chest 2 View  12/22/2011  *RADIOLOGY REPORT*  Clinical Data: Previous CABG.  Weakness.  CHEST - 2 VIEW  Comparison: 11/01/2011  Findings: There has been previous median sternotomy, CABG and mitral valve replacement.  The left lung shows mild scarring.  The right lung shows moderate chronic pleural and parenchymal scarring the appearance is essentially unchanged since previous studies. One could question if there is some patchy infiltrate at the medial right base however.  No acute bony finding.  No evidence of pulmonary edema.  IMPRESSION: Previous CABG and mitral valve replacement.  Pleural and parenchymal scarring much more extensive on the right than the left.  Similar appearance to multiple previous exams. I question if there could be increased patchy density in the medial right base. This raises the possibility of right base pneumonia.   Original Report Authenticated By: Thomasenia Sales, M.D.      1. Healthcare-associated pneumonia   2. UTI (lower urinary tract infection)   3. Weakness       Date: 12/22/2011  Rhythm: normal sinus rhythm  QRS Axis: normal  Intervals: normal  ST/T Wave abnormalities: normal  Conduction Disutrbances: none  Narrative Interpretation: nml  Old EKG Reviewed: No significant changes noted    MDM  Patient with a recent history of UTI presents with weakness gradually worsening over one week. On workup a sign of urinary tract infection and likely pneumonia of his right lower lobe likely due to aspiration.due to recent hospitalization he met criteria for healthcare associated pneumonia and was treated with vancomycin and Zosyn. This will adequately cover his urinary tract infection also. He was admitted to medicine in stable  condition.       Warrick Parisian, MD 12/23/11 [phone]

## 2011-12-22 NOTE — Telephone Encounter (Signed)
Called in Rx

## 2011-12-23 ENCOUNTER — Observation Stay (HOSPITAL_COMMUNITY): Payer: Medicare Other

## 2011-12-23 LAB — COMPREHENSIVE METABOLIC PANEL
Alkaline Phosphatase: 66 U/L (ref 39–117)
BUN: 11 mg/dL (ref 6–23)
CO2: 26 mEq/L (ref 19–32)
Chloride: 101 mEq/L (ref 96–112)
Creatinine, Ser: 1.5 mg/dL — ABNORMAL HIGH (ref 0.50–1.35)
GFR calc non Af Amer: 45 mL/min — ABNORMAL LOW (ref >90)
Potassium: 3.7 mEq/L (ref 3.5–5.1)
Total Bilirubin: 0.6 mg/dL (ref 0.3–1.2)

## 2011-12-23 LAB — CBC
MCH: 23.6 pg — ABNORMAL LOW (ref 26.0–34.0)
Platelets: 245 10*3/uL (ref 150–400)
RBC: 4.4 MIL/uL (ref 4.22–5.81)
RDW: 16.2 % — ABNORMAL HIGH (ref 11.5–15.5)
WBC: 7.4 10*3/uL (ref 4.0–10.5)

## 2011-12-23 LAB — LEGIONELLA ANTIGEN, URINE: Legionella Antigen, Urine: NEGATIVE

## 2011-12-23 LAB — MRSA PCR SCREENING: MRSA by PCR: NEGATIVE

## 2011-12-23 MED ORDER — FUROSEMIDE 20 MG PO TABS
20.0000 mg | ORAL_TABLET | Freq: Every day | ORAL | Status: DC
  Administered 2011-12-23 – 2011-12-24 (×2): 20 mg via ORAL
  Filled 2011-12-23 (×2): qty 1

## 2011-12-23 MED ORDER — SELEGILINE HCL 5 MG PO TABS
5.0000 mg | ORAL_TABLET | Freq: Every day | ORAL | Status: DC
  Administered 2011-12-24: 5 mg via ORAL
  Filled 2011-12-23 (×2): qty 1

## 2011-12-23 MED ORDER — PANTOPRAZOLE SODIUM 40 MG IV SOLR
40.0000 mg | Freq: Once | INTRAVENOUS | Status: AC
  Administered 2011-12-23: 40 mg via INTRAVENOUS
  Filled 2011-12-23: qty 40

## 2011-12-23 MED ORDER — DOXYCYCLINE HYCLATE 100 MG PO TABS
100.0000 mg | ORAL_TABLET | Freq: Two times a day (BID) | ORAL | Status: DC
  Administered 2011-12-23 – 2011-12-24 (×2): 100 mg via ORAL
  Filled 2011-12-23 (×3): qty 1

## 2011-12-23 NOTE — ED Provider Notes (Signed)
I saw and evaluated the patient, reviewed the resident's note and I agree with the findings and plan.   Date: 12/23/2011  Rate: 75  Rhythm: normal sinus rhythm  QRS Axis: normal  Intervals: normal  ST/T Wave abnormalities: normal  Conduction Disutrbances: none  Narrative Interpretation:   Old EKG Reviewed: No significant changes noted  [name] urinary tract infection as well as what appears to be increasing loss of the right inferior heart border concerning for pneumonia.  Patient be treated for healthcare associated pneumonia with flank and Zosyn.  The patient be admitted the hospital.  Dg Chest 2 View  12/22/2011  *RADIOLOGY REPORT*  Clinical Data: Previous CABG.  Weakness.  CHEST - 2 VIEW  Comparison: 11/01/2011  Findings: There has been previous median sternotomy, CABG and mitral valve replacement.  The left lung shows mild scarring.  The right lung shows moderate chronic pleural and parenchymal scarring the appearance is essentially unchanged since previous studies. One could question if there is some patchy infiltrate at the medial right base however.  No acute bony finding.  No evidence of pulmonary edema.  IMPRESSION: Previous CABG and mitral valve replacement.  Pleural and parenchymal scarring much more extensive on the right than the left.  Similar appearance to multiple previous exams. I question if there could be increased patchy density in the medial right base. This raises the possibility of right base pneumonia.   Original Report Authenticated By: Thomasenia Sales, M.D.    Ct Abdomen Pelvis W Contrast  11/27/2011  *RADIOLOGY REPORT*  Clinical Data: Abdominal pain, constipation  CT ABDOMEN AND PELVIS WITH CONTRAST  Technique:  Multidetector CT imaging of the abdomen and pelvis was performed following the standard protocol during bolus administration of intravenous contrast.  Contrast: 80mL OMNIPAQUE IOHEXOL 300 MG/ML  SOLN  Comparison: 10/05/2011  Findings: Small bilateral  pleural effusions, right greater than left.  Previous median sternotomy.  Some patchy consolidation / atelectasis posteriorly in the visualized lung bases, right greater than left, stable.  Unremarkable liver, spleen.  Stable 11 mm left adrenal nodule.  Mild diffuse pancreatic parenchymal atrophy. Bilateral nephrolithiasis, largest on the left in the lower pole 4 mm, on the right in the upper pole 2 mm.  No hydronephrosis. Hyperdense partially exophytic lesion in the upper pole right kidney stable.  No hydronephrosis or ureterectasis.  Stomach is nondilated.  There is a small hiatal hernia.  Small bowel nondistended, with incomplete distal passage of the oral contrast material.  Normal appendix.  Fluid distends the colon proximally. There are scattered sigmoid diverticula without adjacent inflammatory/edematous change.  Urinary bladder incompletely distended.  There is marked prostatic enlargement with central coarse calcifications.  Right inguinal hernia contains only fat. No ascites.  No free air.  Patchy aortoiliac arterial calcifications.  Portal vein patent.  No adenopathy localized. Spondylitic changes in the lumbar spine.  IMPRESSION:  1. No acute abdominal process. 2.  Sigmoid diverticulosis. 3.  Stable small pleural effusions. 4.  Bilateral nephrolithiasis without hydronephrosis. 5.  Prostatic enlargement   Original Report Authenticated By: Osa Craver, M.D.    Dg Abd Acute W/chest  11/26/2011  *RADIOLOGY REPORT*  Clinical Data: Abdominal pain and constipation.  ACUTE ABDOMEN SERIES (ABDOMEN 2 VIEW & CHEST 1 VIEW)  Comparison: Previous examinations.  Findings: Poor inspiration with increased linear density at the right lung base and medial left lung base.  Borderline enlarged cardiac silhouette with stable median sternotomy wires, post CABG changes and prosthetic heart valve.  Paucity of intestinal gas.  Multiple air-fluid levels in normal- caliber bowel loops.  No free peritoneal air.  Mildly  prominent stool in the left colon and sigmoid colon.  Unremarkable bones.  IMPRESSION:  1.  Multiple air-fluid levels in normal-caliber bowel loops.  This can be seen with gastroenteritis.  This could be an indication of early changes of ileus or bowel obstruction as well. 2.  Poor inspiration with mild bibasilar atelectasis.   Original Report Authenticated By: Darrol Angel, M.D.    I personally reviewed the imaging tests through PACS system  I reviewed available ER/hospitalization records thought the EMR Results for orders placed during the hospital encounter of 12/22/11  CBC WITH DIFFERENTIAL      Component Value Range   WBC 7.3  4.0 - 10.5 K/uL   RBC 4.30  4.22 - 5.81 MIL/uL   Hemoglobin 10.3 (*) 13.0 - 17.0 g/dL   HCT 16.1 (*) 09.6 - 04.5 %   MCV 79.1  78.0 - 100.0 fL   MCH 24.0 (*) 26.0 - 34.0 pg   MCHC 30.3  30.0 - 36.0 g/dL   RDW 40.9 (*) 81.1 - 91.4 %   Platelets 218  150 - 400 K/uL   Neutrophils Relative 71  43 - 77 %   Neutro Abs 5.2  1.7 - 7.7 K/uL   Lymphocytes Relative 18  12 - 46 %   Lymphs Abs 1.3  0.7 - 4.0 K/uL   Monocytes Relative 8  3 - 12 %   Monocytes Absolute 0.6  0.1 - 1.0 K/uL   Eosinophils Relative 3  0 - 5 %   Eosinophils Absolute 0.2  0.0 - 0.7 K/uL   Basophils Relative 1  0 - 1 %   Basophils Absolute 0.1  0.0 - 0.1 K/uL  COMPREHENSIVE METABOLIC PANEL      Component Value Range   Sodium 140  135 - 145 mEq/L   Potassium 4.2  3.5 - 5.1 mEq/L   Chloride 106  96 - 112 mEq/L   CO2 25  19 - 32 mEq/L   Glucose, Bld 98  70 - 99 mg/dL   BUN 12  6 - 23 mg/dL   Creatinine, Ser 7.82 (*) 0.50 - 1.35 mg/dL   Calcium 9.1  8.4 - 95.6 mg/dL   Total Protein 6.8  6.0 - 8.3 g/dL   Albumin 3.7  3.5 - 5.2 g/dL   AST 13  0 - 37 U/L   ALT 5  0 - 53 U/L   Alkaline Phosphatase 68  39 - 117 U/L   Total Bilirubin 0.4  0.3 - 1.2 mg/dL   GFR calc non Af Amer 50 (*) >90 mL/min   GFR calc Af Amer 58 (*) >90 mL/min  URINALYSIS, ROUTINE W REFLEX MICROSCOPIC      Component  Value Range   Color, Urine YELLOW  YELLOW   APPearance CLOUDY (*) CLEAR   Specific Gravity, Urine 1.023  1.005 - 1.030   pH 7.0  5.0 - 8.0   Glucose, UA NEGATIVE  NEGATIVE mg/dL   Hgb urine dipstick NEGATIVE  NEGATIVE   Bilirubin Urine NEGATIVE  NEGATIVE   Ketones, ur NEGATIVE  NEGATIVE mg/dL   Protein, ur NEGATIVE  NEGATIVE mg/dL   Urobilinogen, UA 1.0  0.0 - 1.0 mg/dL   Nitrite NEGATIVE  NEGATIVE   Leukocytes, UA LARGE (*) NEGATIVE  TSH      Component Value Range   TSH 2.056  0.350 - 4.500  uIU/mL  T4, FREE      Component Value Range   Free T4 1.64  0.80 - 1.80 ng/dL  TROPONIN I      Component Value Range   Troponin I <0.30  <0.30 ng/mL  URINE MICROSCOPIC-ADD ON      Component Value Range   WBC, UA 21-50  <3 WBC/hpf   Bacteria, UA FEW (*) RARE   Casts HYALINE CASTS (*) NEGATIVE   Urine-Other MUCOUS PRESENT    CBC      Component Value Range   WBC 7.2  4.0 - 10.5 K/uL   RBC 3.99 (*) 4.22 - 5.81 MIL/uL   Hemoglobin 9.5 (*) 13.0 - 17.0 g/dL   HCT 16.1 (*) 09.6 - 04.5 %   MCV 78.7  78.0 - 100.0 fL   MCH 23.8 (*) 26.0 - 34.0 pg   MCHC 30.3  30.0 - 36.0 g/dL   RDW 40.9 (*) 81.1 - 91.4 %   Platelets 203  150 - 400 K/uL  CREATININE, SERUM      Component Value Range   Creatinine, Ser 1.37 (*) 0.50 - 1.35 mg/dL   GFR calc non Af Amer 51 (*) >90 mL/min   GFR calc Af Amer 59 (*) >90 mL/min        Lyanne Co, MD 12/23/11 0120

## 2011-12-23 NOTE — Evaluation (Signed)
Physical Therapy Evaluation Patient Details Name: [name] MRN: [id number] DOB: [birth year] Today's Date: 12/23/2011 Time: 4098-1191 PT Time Calculation (min): 40 min  PT Assessment / Plan / Recommendation Clinical Impression  71 yr old male admitted for progressive weakness and questionable aspiration PNA. Has history of Parkinson's disease over the past 4 yrs as well. Pt with decreased functional mobility and independence at this time. Pt will benefit from skilled PT in the acute care setting in order to maximize functional mobility and safety prior to d/c home with wife.     PT Assessment  Patient needs continued PT services    Follow Up Recommendations  Home health PT;Supervision for mobility/OOB    Does the patient have the potential to tolerate intense rehabilitation      Barriers to Discharge        Equipment Recommendations  None recommended by OT;None recommended by PT    Recommendations for Other Services     Frequency Min 3X/week    Precautions / Restrictions Precautions Precautions: Fall Restrictions Weight Bearing Restrictions: No   Pertinent Vitals/Pain No pain complaints.       Mobility  Bed Mobility Bed Mobility: Supine to Sit Supine to Sit: 5: Supervision;HOB flat Sitting - Scoot to Edge of Bed: 5: Supervision Sit to Supine: 4: Min assist Details for Bed Mobility Assistance: Pt given increased time to complete transfer in/out of bed. Min assist with LEs back into bed. Assist to bring pt to [facility] Transfers Transfers: Sit to Stand;Stand to Sit Sit to Stand: 4: Min assist;With upper extremity assist;From bed Stand to Sit: 4: Min assist;To bed;Without upper extremity assist Details for Transfer Assistance: Pt needs min instructional cues for and placement during stand to sit transition. Ambulation/Gait Ambulation/Gait Assistance: 4: Min assist Ambulation Distance (Feet): 30 Feet Assistive device: Rolling walker Ambulation/Gait Assistance  Details: Cueing for navigation and safety with RW. Pt with short shuffling steps although increased with distance Gait Pattern: Trunk flexed;Narrow base of support;Shuffle;Decreased hip/knee flexion - right;Decreased hip/knee flexion - left;Decreased stride length Gait velocity: decreased gait speed    Shoulder Instructions     Exercises     PT Diagnosis: Difficulty walking  PT Problem List: Decreased activity tolerance;Decreased mobility;Decreased knowledge of use of DME;Decreased safety awareness;Decreased knowledge of precautions;Pain PT Treatment Interventions: DME instruction;Gait training;Functional mobility training;Therapeutic activities;Therapeutic exercise;Patient/family education;Neuromuscular re-education   PT Goals Acute Rehab PT Goals PT Goal Formulation: With patient/family Time For Goal Achievement: 12/30/11 Potential to Achieve Goals: Fair Pt will go Supine/Side to Sit: with modified independence PT Goal: Supine/Side to Sit - Progress: Goal set today Pt will go Sit to Supine/Side: with modified independence PT Goal: Sit to Supine/Side - Progress: Goal set today Pt will go Sit to Stand: with modified independence PT Goal: Sit to Stand - Progress: Goal set today Pt will go Stand to Sit: with modified independence PT Goal: Stand to Sit - Progress: Goal set today Pt will Transfer Bed to Chair/Chair to Bed: with supervision PT Transfer Goal: Bed to Chair/Chair to Bed - Progress: Goal set today Pt will Ambulate: 51 - 150 feet;with supervision;with least restrictive assistive device PT Goal: Ambulate - Progress: Goal set today  Visit Information  Last PT Received On: 12/23/11 Assistance Needed: +1 PT/OT Co-Evaluation/Treatment: Yes    Subjective Data  Patient Stated Goal: no goal stated   Prior Functioning  Home Living Lives With: Spouse Available Help at Discharge: Family;Available 24 hours/day Type of Home: House Home Access: Ramped entrance Home Layout: One  level Bathroom Shower/Tub: Forensic scientist: Standard Bathroom Accessibility: Yes How Accessible: Accessible via walker Home Adaptive Equipment: Grab bars in shower;Shower chair with back;Walker - rolling;Straight cane;Wheelchair - manual;Bedside commode/3-in-1;Hospital bed Prior Function Level of Independence: Needs assistance Needs Assistance: Bathing;Dressing;Feeding;Toileting;Meal Prep;Light Housekeeping;Gait Bath: Moderate Dressing: Moderate Feeding: Maximal Toileting: Moderate Meal Prep: Total Light Housekeeping: Total Gait Assistance: wife assists while walking, varies how much assistance Able to Take Stairs?: No Driving: No Communication Communication: Other (comment) (low volume) Dominant Hand: Right    Cognition  Overall Cognitive Status: History of cognitive impairments - at baseline Arousal/Alertness: Lethargic Orientation Level: Appears intact for tasks assessed Behavior During Session: Flat affect Cognition - Other Comments: Wife reports his memory has been good but he takes longer to respond to questions asked..    Extremity/Trunk Assessment Right Upper Extremity Assessment RUE ROM/Strength/Tone: Deficits RUE ROM/Strength/Tone Deficits: Shoulder flexion 0-110 degrees, strength in shoulders 4/5, elbow flexion 4/5, grip strength 3+/5 as well. RUE Sensation: WFL - Light Touch RUE Coordination: Deficits RUE Coordination Deficits: Decreased ability to tie his gown. Left Upper Extremity Assessment LUE ROM/Strength/Tone: Deficits LUE ROM/Strength/Tone Deficits: Shoulder flexion 0-110 degrees, strength in shoulders 4/5, elbow flexion 4/5, grip strength 3+/5 as well. LUE Coordination: Deficits LUE Coordination Deficits: Decreased ability to tie his gown. Right Lower Extremity Assessment RLE ROM/Strength/Tone: Within functional levels RLE Sensation: WFL - Light Touch Left Lower Extremity Assessment LLE ROM/Strength/Tone: Within functional  levels LLE Sensation: WFL - Light Touch Trunk Assessment Trunk Assessment: Normal   Balance Balance Balance Assessed: Yes Static Standing Balance Static Standing - Balance Support: No upper extremity supported Static Standing - Level of Assistance: 4: Min assist  End of Session PT - End of Session Equipment Utilized During Treatment: Gait belt Activity Tolerance: Patient tolerated treatment well Patient left: in bed;with call bell/phone within reach;with family/visitor present Nurse Communication: Mobility status  GP Functional Assessment Tool Used: clinical judgement Functional Limitation: Mobility: Walking and moving around Mobility: Walking and Moving Around Current Status (W0981): At least 1 percent but less than 20 percent impaired, limited or restricted Mobility: Walking and Moving Around Goal Status [phone]): 0 percent impaired, limited or restricted   Milana Kidney 12/23/2011, 1:19 PM  12/23/2011 Milana Kidney DPT PAGER: [phone] OFFICE: [phone]
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
## 2011-12-23 NOTE — Care Management Note (Signed)
    Page 1 of 2   12/24/2011     2:04:42 PM   CARE MANAGEMENT NOTE 12/24/2011  Patient:  [name], [name]   Account Number:  [id number]  Date Initiated:  12/23/2011  Documentation initiated by:  Khole Branch  Subjective/Objective Assessment:   PT ADM 12/22/11 WITH LIKELY PNA.  PT HAS HX OF PARKINSON'S AND IS CARED FOR BY WIFE AT HOME.  HE IS ACTIVE WITH [facility] FOR HOME HEALTH SERVICES.     Action/Plan:   WILL NEED RESUMPTION OF CARE ORDERS FOR HOME HEALTH CARE PRIOR TO DISCHARGE.  MD:  PLEASE LEAVE ORDERS.   Anticipated DC Date:  12/24/2011   Anticipated DC Plan:  HOME W HOME HEALTH SERVICES      DC Planning Services  CM consult      [facility] Choice  HOME HEALTH   Choice offered to / List presented to:  C-1 Patient        HH arranged  HH-1 RN  HH-2 PT  HH-3 OT  HH-5 SPEECH THERAPY  HH-6 SOCIAL WORKER      HH agency  [facility]   Status of service:  Completed, signed off Medicare Important Message given?   (If response is "NO", the following Medicare IM given date fields will be blank) Date Medicare IM given:   Date Additional Medicare IM given:    Discharge Disposition:  HOME W HOME HEALTH SERVICES  Per UR Regulation:  Reviewed for med. necessity/level of care/duration of stay  If discussed at Long Length of Stay Meetings, dates discussed:    Comments:  12/24/11 Thea Holshouser,RN,BSN [phone] PT FOR DISCHARGE TODAY WITH WIFE.  MET WITH PT AND WIFE TO DISCUSS DC NEEDS.  WIFE STATES THEY HAVE W/C, RW, CANE, BSC, CANE AND SHOWER CHAIR AT HOME.  [facility] TO RESUME HOME HEALTH SERVICE AS PRIOR TO ADMISSION.  START OF CARE 24-48H POST DC DATE.  WIFE DENIES ANY ADDITIONAL NEEDS FOR HOME. NOTIFIED [facility] OF DC TODAY.   12/23/11 Ashanti Littles,RN,BSN [phone]  PT CURRENTLY RECEIVING HOME HEALTH RN, PT, SPEECH THERAPY AND SOCIAL WORK AT HOME, PER MARY WITH [facility].   MD, PLEASE ADD HHOT, PER OCCUPATIONAL THERAPIST'S RECOMMENDATIONS.  THANKS!

## 2011-12-23 NOTE — Evaluation (Addendum)
Occupational Therapy Evaluation Patient Details Name: [name] MRN: [id number] DOB: 1940/10/16 Today's Date: 12/23/2011 Time: 5784-6962 OT Time Calculation (min): 40 min  OT Assessment / Plan / Recommendation Clinical Impression  71 yr old male admitted for progressive weakness and questionable aspiration PNA.  Has history of Parkinson's disease over the past 4 yrs as well.  Presents with an overall increase in his dependence level with selfcare tasks compared to his normal baseline.  Feel he will benefit from acute OT services to help increase independendence with thes ADLs in order to return home with his wife and 24 hour supervision.  Feel he will also benefit from continued OT at home health setting to further progress and to provide input on environmental changes within his residence.    OT Assessment  Patient needs continued OT Services    Follow Up Recommendations  Home health OT             Frequency  Min 2X/week    Precautions / Restrictions Precautions Precautions: Fall Restrictions Weight Bearing Restrictions: No   Pertinent Vitals/Pain No report of pain during session    ADL  Eating/Feeding: Performed;Set up Where Assessed - Eating/Feeding: Bed level Grooming: Performed;Teeth care;Minimal assistance Where Assessed - Grooming: Supported standing Upper Body Bathing: Simulated;Set up Where Assessed - Upper Body Bathing: Unsupported sitting Lower Body Bathing: Simulated;Minimal assistance Where Assessed - Lower Body Bathing: Supported sit to stand Upper Body Dressing: Simulated;Set up Where Assessed - Upper Body Dressing: Unsupported sitting Lower Body Dressing: Simulated Where Assessed - Lower Body Dressing: Supported sit to stand Toilet Transfer: Minimal assistance Toilet Transfer Method: Stand Wellsite geologist: Other (comment) (To edge of bed, pt with condom cathetor) Toileting - Architect and Hygiene: Simulated;Minimal  assistance Where Assessed - Engineer, mining and Hygiene: Other (comment) (sit to stand from EOB.) Tub/Shower Transfer Method: Not assessed Equipment Used: Rolling walker;Gait belt Transfers/Ambulation Related to ADLs: Pt overall min assist for short distance mobility to the sink and back to the side of the bed. ADL Comments: Pt with decreased ability to tie his gown but was able to setup his toothbrush and also feed himself using his UEs.  Wife reports that he usually needs at least min assist because he fatigues during dressing and bathing and then needs assitance. She also reports that his tremors have been bad the past couple of weeks to the point she has had to help feed him.  Today he was able to feed without any notable tremors.    OT Diagnosis: Generalized weakness;Cognitive deficits  OT Problem List: Decreased strength;Impaired balance (sitting and/or standing);Decreased activity tolerance;Decreased coordination;Decreased cognition OT Treatment Interventions: Self-care/ADL training;Therapeutic activities;Therapeutic exercise;DME and/or AE instruction;Balance training;Patient/family education   OT Goals Acute Rehab OT Goals OT Goal Formulation: With patient/family Time For Goal Achievement: 01/06/12 Potential to Achieve Goals: Good ADL Goals Pt Will Perform Grooming: with supervision;Standing at sink ADL Goal: Grooming - Progress: Goal set today Pt Will Perform Lower Body Bathing: with supervision;Sit to stand from bed ADL Goal: Lower Body Bathing - Progress: Goal set today Pt Will Perform Lower Body Dressing: with supervision;Sit to stand from bed ADL Goal: Lower Body Dressing - Progress: Goal set today Pt Will Transfer to Toilet: with supervision;with DME;3-in-1 ADL Goal: Toilet Transfer - Progress: Goal set today Pt Will Perform Toileting - Clothing Manipulation: with supervision;Sitting on 3-in-1 or toilet;Standing ADL Goal: Toileting - Clothing Manipulation -  Progress: Goal set today Pt Will Perform Toileting - Hygiene: with supervision;Sit  to stand from 3-in-1/toilet ADL Goal: Toileting - Hygiene - Progress: Goal set today Pt Will Perform Tub/Shower Transfer: Tub transfer;with supervision;Grab bars;Shower seat with back;Ambulation ADL Goal: Tub/Shower Transfer - Progress: Goal set today  Visit Information  Last OT Received On: 12/23/11 Assistance Needed: +1 PT/OT Co-Evaluation/Treatment: Yes    Subjective Data  Subjective: "You want me to stand up?" Patient Stated Goal: Pt's wife wants him to get back to being more independent with selfcare tasks and transfers.   Prior Functioning     Home Living Lives With: Spouse Available Help at Discharge: Family;Available 24 hours/day Type of Home: House Home Access: Ramped entrance Home Layout: One level Bathroom Shower/Tub: Tub/shower unit;Curtain Firefighter: Standard Bathroom Accessibility: Yes How Accessible: Accessible via walker Home Adaptive Equipment: Grab bars in shower;Shower chair with back;Walker - rolling;Straight cane;Wheelchair - manual;Bedside commode/3-in-1;Hospital bed Prior Function Level of Independence: Needs assistance Needs Assistance: Bathing;Dressing;Feeding;Toileting;Meal Prep;Light Housekeeping;Gait Bath: Moderate Dressing: Moderate Feeding: Maximal Toileting: Moderate Meal Prep: Total Light Housekeeping: Total Gait Assistance: wife assists while walking, varies how much assistance Able to Take Stairs?: No Driving: No Communication Communication: Other (comment) (low volume) Dominant Hand: Right         Vision/Perception Vision - Assessment Vision Assessment: Vision not tested Perception Perception: Within Functional Limits Praxis Praxis: Intact   Cognition  Overall Cognitive Status: History of cognitive impairments - at baseline Arousal/Alertness: Lethargic Orientation Level: Appears intact for tasks assessed Behavior During Session: Flat  affect Cognition - Other Comments: Wife reports his memory has been good but he takes longer to respond to questions asked..    Extremity/Trunk Assessment Right Upper Extremity Assessment RUE ROM/Strength/Tone: Deficits RUE ROM/Strength/Tone Deficits: Shoulder flexion 0-110 degrees, strength in shoulders 4/5, elbow flexion 4/5, grip strength 3+/5 as well. RUE Sensation: WFL - Light Touch RUE Coordination: Deficits RUE Coordination Deficits: Decreased ability to tie his gown. Left Upper Extremity Assessment LUE ROM/Strength/Tone: Deficits LUE ROM/Strength/Tone Deficits: Shoulder flexion 0-110 degrees, strength in shoulders 4/5, elbow flexion 4/5, grip strength 3+/5 as well. LUE Coordination: Deficits LUE Coordination Deficits: Decreased ability to tie his gown. Right Lower Extremity Assessment RLE ROM/Strength/Tone: Within functional levels RLE Sensation: WFL - Light Touch Left Lower Extremity Assessment LLE ROM/Strength/Tone: Within functional levels LLE Sensation: WFL - Light Touch Trunk Assessment Trunk Assessment: Normal     Mobility Bed Mobility Bed Mobility: Supine to Sit Supine to Sit: 5: Supervision;HOB flat Sitting - Scoot to Edge of Bed: 5: Supervision Sit to Supine: 4: Min assist Details for Bed Mobility Assistance: Pt given increased time to complete transfer in/out of bed. Min assist with LEs back into bed. Assist to bring pt to [facility] Transfers Transfers: Sit to Stand;Stand to Sit Sit to Stand: 4: Min assist;With upper extremity assist;From bed Stand to Sit: 4: Min assist;To bed;Without upper extremity assist Details for Transfer Assistance: Pt needs min instructional cues for and placement during stand to sit transition.           Balance Balance Balance Assessed: Yes Static Standing Balance Static Standing - Balance Support: No upper extremity supported Static Standing - Level of Assistance: 4: Min assist   End of Session OT - End of Session Equipment  Utilized During Treatment: Gait belt Activity Tolerance: Patient limited by fatigue Patient left: in bed;with call bell/phone within reach;with nursing in room Nurse Communication: Mobility status  GO Functional Assessment Tool Used: Clinical Judgement Functional Limitation: Self care Self Care Current Status (G2952): At least 20 percent but less than 40 percent impaired,  limited or restricted Self Care Goal Status [phone]): At least 1 percent but less than 20 percent impaired, limited or restricted   Nathasha Fiorillo OTR/L Pager number [phone] 12/23/2011, 1:23 PM

## 2011-12-24 LAB — URINE CULTURE: Colony Count: >=100000

## 2011-12-24 LAB — COMPREHENSIVE METABOLIC PANEL
Albumin: 3.5 g/dL (ref 3.5–5.2)
BUN: 13 mg/dL (ref 6–23)
Calcium: 8.8 mg/dL (ref 8.4–10.5)
Creatinine, Ser: 1.49 mg/dL — ABNORMAL HIGH (ref 0.50–1.35)
GFR calc Af Amer: 53 mL/min — ABNORMAL LOW (ref >90)
Glucose, Bld: 93 mg/dL (ref 70–99)
Total Protein: 6.4 g/dL (ref 6.0–8.3)

## 2011-12-24 MED ORDER — SELEGILINE HCL 5 MG PO TABS: 5.0000 mg | ORAL_TABLET | Freq: Every day | ORAL | Status: DC

## 2011-12-24 MED ORDER — DOXYCYCLINE HYCLATE 100 MG PO TABS: 100.0000 mg | ORAL_TABLET | Freq: Two times a day (BID) | ORAL | Status: DC

## 2011-12-24 NOTE — Progress Notes (Signed)
Pt discharge instructions and patient education complete. IV site d/c. Site WNL. No s/s of distress. D/C home with wife. Yiannis Tulloch Taylor  

## 2011-12-26 NOTE — Discharge Summary (Signed)
Physician Discharge Summary  [name]:[id number] DOB: Jul 26, 1940 DOA: 12/22/2011  PCP: Nelwyn Salisbury, MD  Admit date: 12/22/2011 Discharge date: 12/26/2011  Time spent: 20 minutes  Recommendations for Outpatient Follow-up:  1. Follow up CXr recommended in 3 weeks 2. Close follow-up c Dr. Marylou Flesher 3. Needs out-patient MMSE  Discharge Diagnoses:  Principal Problem:  *Aspiration pneumonia Active Problems:  Dementia  DEPRESSION  CAD  Atrial fibrillation  UNSPEC COMBINED SYSTOLIC&DIASTOLIC HEART FAILURE  DVT  ASTHMA  COLONIC POLYPS, HX OF  BENIGN PROSTATIC HYPERTROPHY, HX OF  History of mitral valve repair   Filed Weights   12/22/11 2000 12/22/11 2205 12/24/11 0616  Weight: 104.5 kg (230 lb 6.1 oz) 101.9 kg (224 lb 10.4 oz) 100 kg (220 lb 7.4 oz)    History of present illness:  [name] is a 71 y.o. male who presented to the [facility] ed 12/22/2011 with weakness. This seems to have gotten worse over the past week. He sees Dr. Latrelle Dodrill at [facility]-and the patient was last seen by about 1 year ago by the Neurologist. He has been eating less than usual and wife reports a cough. Cough startedd maybe about 12/17/2011-it was initally thoguht to be a dry cough, but it seemed to worsen with taking his medications and he couldn't cough. It was thought that this might be some silent aspiration. Wife noted maybe low grade temps-patient didn't complain of being hot or cold  He has a h/o lazy bowel and was called in some laxatives over the weekend of 1026 and 12/18/2028-He was given 4 TBSP per day of this and yoghurt and this seemd to help greatly  His PArkinson's tremors seems to have worsened as well and his cognition also seems to have worsened-he now has very small steps and blank face like mask face no expressions when talking with his wife   Hospital Course:  1. Potential aspiration pneumonia-given patient has parkinsonism and has been drooling,  there is a risk for aspiration pneumonia/pneumonitis-I have reviewed the chest x-rays and indeed there does seem to be a small opacity in the right mid lung fields. Vanc/Zosyn trasitioned to oral Doxycycline 12/23/11. 2. ?CHF-Given a dose of lasix iv 10/30, cleared some of the lung opacity on follow up CXR 10/31-suspect this might be very well mild CHF-hold lasix. Consider qod dosing 20 mg till seen by cardiologist as out-patient-needs BMEt in 4 days 3. Extensive CAD history-continue isosorbide mononitrate, 7.5 mg, aspirin 325 mg 4. Paroxysmal atrial fibrillation. CHAD2Vasc=3,-continue amiodarone 200 mg daily-patient rate controlled at present time. 5. CKD stage 2-3, eGFr 50 on admit= monitor with labs in the morning. Discontinue nephrotoxins. 6. History mitral valve repair-seems not to be on Coumadin because of orthostasis-continue aspirin 325 mg 7. Parkinsonism-this seems to be the main N. major issue. Patient's unsteady we will get PT OT consult-Recommended Home health PT/OT-[facility] to follow patient. Discussed case c Dr. Marylou Flesher who recommends MRI brain c/out contrast and starting Selegiline 5 po q am.  She will arrange close -outpatient visit 8. Hyperlipidemia continue Zocor 10 mg daily. 9. Hypothyroidism-needs TSH monitoring q monthly (on Amiodarone) 10. Urinary incontinence-Continue Tamsulosin 11. Potential dementia/behavioral disturbances-continue respiratory 1 mg at bedtime, temazepam 30 mg at bedtime, clonazepam 1 mg q. 6 hourly when necessary-this may need to be discontinued at discretion urologist-this can worsen his specific other comorbidity parkinsonism given benzodiazepines can worsen mentation 12. Constipation continue bisacodyl and lactulose 10 mg daily  Consultants:  Telephone consulted Dr. Marylou Flesher 12/23/11  Procedures:  MRI brain negative  Antibiotics:  None  Discharge Exam: Filed Vitals:   12/23/11 0640 12/23/11 1406 12/23/11 1951 12/24/11 0616  BP: 121/80 101/65 115/66  120/87  Pulse: 75 82 83 86  Temp: 97.5 F (36.4 C) 97.4 F (36.3 C) 97.5 F (36.4 C) 97.7 F (36.5 C)  TempSrc: Oral Oral Oral   Resp: 16 18 18 18   Height:      Weight:    100 kg (220 lb 7.4 oz)  SpO2: 97% 95% 97% 96%    Discharge Instructions  Discharge Orders    Future Appointments: Provider: Department: Dept Phone: Center:   12/27/2011 3:15 PM Nelwyn Salisbury, MD [facility] [place] [phone] [facility]   01/05/2012 2:00 PM Lewayne Bunting, MD [facility] [place] [phone] [id number]   02/02/2012 2:00 PM Lewayne Bunting, MD [facility] [place] [phone] [id number]     Future Orders Please Complete By Expires   Home Health      Questions: Responses:   To provide the following care/treatments PT    OT    RN    Home Health Aide   Face-to-face encounter      Comments:   Kelle Darting certify that this patient is under my care and that I, or a nurse practitioner or physician's assistant working with me, had a face-to-face encounter that meets the physician face-to-face encounter requirements with this patient on 12/24/2011.   Questions: Responses:   The encounter with the patient was in whole, or in part, for the following medical condition, which is the primary reason for home health care severe parkinsons   I certify that, based on my findings, the following services are medically necessary home health services Nursing    Physical therapy   My clinical findings support the need for the above services Complex treatment plan/patient with lack knowledge disease process and treatment   Further, I certify that my clinical findings support that this patient is homebound due to: Ambulates short distances less than 300 feet   To provide the following care/treatments PT    OT    RN    Home Health Aide       Medication List     As of 12/26/2011  6:42 PM    TAKE these medications         amiodarone 200 MG tablet    Commonly known as: PACERONE   Take 1 tablet (200 mg total) by mouth daily.      aspirin EC 325 MG tablet   Take 325 mg by mouth daily.      bisacodyl 10 MG suppository   Commonly known as: DULCOLAX   Place 10 mg rectally as needed. For upset stomach      carbidopa-levodopa 25-100 MG per tablet   Commonly known as: SINEMET IR   Take 1 tablet by mouth 3 (three) times daily.      clonazePAM 1 MG tablet   Commonly known as: KLONOPIN   Take 1 tablet (1 mg total) by mouth every 6 (six) hours.      doxycycline 100 MG tablet   Commonly known as: VIBRA-TABS   Take 1 tablet (100 mg total) by mouth every 12 (twelve) hours.      GENERLAC 10 GM/15ML Soln   Generic drug: lactulose (encephalopathy)   Take 10 g by mouth daily as needed. For soft stools per wife      hydrocortisone cream 1 %   Apply 1  application topically 2 (two) times daily as needed. For skin irritation when sweating      isosorbide mononitrate 15 mg Tb24   Commonly known as: IMDUR   Take 0.5 tablets (15 mg total) by mouth daily.      levothyroxine 75 MCG tablet   Commonly known as: SYNTHROID, LEVOTHROID   Take 75 mcg by mouth daily.      multivitamin with minerals Tabs   Take 1 tablet by mouth daily.      nitroGLYCERIN 0.4 MG SL tablet   Commonly known as: NITROSTAT   Place 0.4 mg under the tongue every 5 (five) minutes as needed. For chest pain      pantoprazole 40 MG tablet   Commonly known as: PROTONIX   Take 40 mg by mouth 2 (two) times daily.      polyethylene glycol packet   Commonly known as: MIRALAX / GLYCOLAX   Take 17 g by mouth 4 (four) times daily.      pravastatin 40 MG tablet   Commonly known as: PRAVACHOL   Take 40 mg by mouth at bedtime.      risperiDONE 1 MG tablet   Commonly known as: RISPERDAL   Take 1 mg by mouth at bedtime.      selegiline 5 MG tablet   Commonly known as: ELDEPRYL   Take 1 tablet (5 mg total) by mouth daily before breakfast.      Tamsulosin HCl 0.4 MG Caps    Commonly known as: FLOMAX   Take 0.4 mg by mouth every morning.      temazepam 30 MG capsule   Commonly known as: RESTORIL   Take 30 mg by mouth at bedtime as needed. For sleep           Follow-up Information    Follow up with FRY,STEPHEN A, MD. In 1 week.   Contact information:   [address] [place] [address] [phone]           The results of significant diagnostics from this hospitalization (including imaging, microbiology, ancillary and laboratory) are listed below for reference.    Significant Diagnostic Studies: Dg Chest 2 View  12/23/2011  *RADIOLOGY REPORT*  Clinical Data: Shortness of breath and pneumonia.  History of asthma.  CHEST - 2 VIEW  Comparison: 12/22/2011  Findings:   Marked hyperinflation/COPD.  Median sternotomy and mitral valve repair.  Artifact degraded lateral view.  Mild cardiomegaly.  Small volume right-sided posterior pleural fluid and thickening.  No pneumothorax.  Multifocal pleural parenchymal scarring, greater right than left.  Improved right base aeration.  IMPRESSION: 1.  Improved right base air space disease. 2.  Hyperinflation/COPD with bilateral pleural parenchymal scarring. 3.  Similar posterior right-sided pleural thickening/fluid.   Original Report Authenticated By: Jeronimo Greaves, M.D.    Dg Chest 2 View  12/22/2011  *RADIOLOGY REPORT*  Clinical Data: Previous CABG.  Weakness.  CHEST - 2 VIEW  Comparison: 11/01/2011  Findings: There has been previous median sternotomy, CABG and mitral valve replacement.  The left lung shows mild scarring.  The right lung shows moderate chronic pleural and parenchymal scarring the appearance is essentially unchanged since previous studies. One could question if there is some patchy infiltrate at the medial right base however.  No acute bony finding.  No evidence of pulmonary edema.  IMPRESSION: Previous CABG and mitral valve replacement.  Pleural and parenchymal scarring much more extensive on the  right than the left.  Similar appearance to multiple  previous exams. I question if there could be increased patchy density in the medial right base. This raises the possibility of right base pneumonia.   Original Report Authenticated By: Thomasenia Sales, M.D.    Mr Brain Wo Contrast  12/23/2011  *RADIOLOGY REPORT*  Clinical Data: Parkinsonism with worsening of symptoms.  MRI HEAD WITHOUT CONTRAST  Technique:  Multiplanar, multiecho pulse sequences of the brain and surrounding structures were obtained according to standard protocol without intravenous contrast.  Comparison: MRI 11/18/2010  Findings: Negative for acute infarct.  Mild age appropriate atrophy is present.  Mild chronic changes in the white matter bilaterally are stable and compatible with chronic microvascular ischemia. Negative for intracranial hemorrhage or fluid collection.  Small dural based soft tissue mass anterior to the pons on the right is unchanged.  No contrast was administered however this would be typical for a meningioma which is stable.  This measures 7 x 12 mm.  There is no mass effect on the pons.  No other mass lesion is identified.  Mild mucosal thickening right maxillary sinus. Right mastoid sinus effusion, unchanged.  IMPRESSION: Chronic microvascular ischemic changes in the white matter.  No acute infarct.  Small meningioma to the right of the pons is unchanged from the prior study.  No significant mass effect on the pons is identified.   Original Report Authenticated By: Janeece Riggers, M.D.    Ct Abdomen Pelvis W Contrast  11/27/2011  *RADIOLOGY REPORT*  Clinical Data: Abdominal pain, constipation  CT ABDOMEN AND PELVIS WITH CONTRAST  Technique:  Multidetector CT imaging of the abdomen and pelvis was performed following the standard protocol during bolus administration of intravenous contrast.  Contrast: 80mL OMNIPAQUE IOHEXOL 300 MG/ML  SOLN  Comparison: 10/05/2011  Findings: Small bilateral pleural effusions, right greater  than left.  Previous median sternotomy.  Some patchy consolidation / atelectasis posteriorly in the visualized lung bases, right greater than left, stable.  Unremarkable liver, spleen.  Stable 11 mm left adrenal nodule.  Mild diffuse pancreatic parenchymal atrophy. Bilateral nephrolithiasis, largest on the left in the lower pole 4 mm, on the right in the upper pole 2 mm.  No hydronephrosis. Hyperdense partially exophytic lesion in the upper pole right kidney stable.  No hydronephrosis or ureterectasis.  Stomach is nondilated.  There is a small hiatal hernia.  Small bowel nondistended, with incomplete distal passage of the oral contrast material.  Normal appendix.  Fluid distends the colon proximally. There are scattered sigmoid diverticula without adjacent inflammatory/edematous change.  Urinary bladder incompletely distended.  There is marked prostatic enlargement with central coarse calcifications.  Right inguinal hernia contains only fat. No ascites.  No free air.  Patchy aortoiliac arterial calcifications.  Portal vein patent.  No adenopathy localized. Spondylitic changes in the lumbar spine.  IMPRESSION:  1. No acute abdominal process. 2.  Sigmoid diverticulosis. 3.  Stable small pleural effusions. 4.  Bilateral nephrolithiasis without hydronephrosis. 5.  Prostatic enlargement   Original Report Authenticated By: Osa Craver, M.D.    Dg Abd Acute W/chest  11/26/2011  *RADIOLOGY REPORT*  Clinical Data: Abdominal pain and constipation.  ACUTE ABDOMEN SERIES (ABDOMEN 2 VIEW & CHEST 1 VIEW)  Comparison: Previous examinations.  Findings: Poor inspiration with increased linear density at the right lung base and medial left lung base.  Borderline enlarged cardiac silhouette with stable median sternotomy wires, post CABG changes and prosthetic heart valve.  Paucity of intestinal gas.  Multiple air-fluid levels in normal- caliber bowel loops.  No  free peritoneal air.  Mildly prominent stool in the left colon  and sigmoid colon.  Unremarkable bones.  IMPRESSION:  1.  Multiple air-fluid levels in normal-caliber bowel loops.  This can be seen with gastroenteritis.  This could be an indication of early changes of ileus or bowel obstruction as well. 2.  Poor inspiration with mild bibasilar atelectasis.   Original Report Authenticated By: Darrol Angel, M.D.     Microbiology: Recent Results (from the past 240 hour(s))  URINE CULTURE     Status: Normal   Collection Time   12/22/11  5:28 PM      Component Value Range Status Comment   Specimen Description URINE, RANDOM   Final    Special Requests NONE   Final    Culture  Setup Time 12/22/2011 18:13   Final    Colony Count >=100,000 COLONIES/ML   Final    Culture     Final    Value: Multiple bacterial morphotypes present, none predominant. Suggest appropriate recollection if clinically indicated.   Report Status 12/24/2011 FINAL   Final   CULTURE, BLOOD (ROUTINE X 2)     Status: Normal (Preliminary result)   Collection Time   12/22/11 10:35 PM      Component Value Range Status Comment   Specimen Description BLOOD RIGHT ARM   Final    Special Requests BOTTLES DRAWN AEROBIC ONLY 10CC   Final    Culture  Setup Time 12/23/2011 03:55   Final    Culture     Final    Value:        BLOOD CULTURE RECEIVED NO GROWTH TO DATE CULTURE WILL BE HELD FOR 5 DAYS BEFORE ISSUING A FINAL NEGATIVE REPORT   Report Status PENDING   Incomplete   CULTURE, BLOOD (ROUTINE X 2)     Status: Normal (Preliminary result)   Collection Time   12/22/11 10:45 PM      Component Value Range Status Comment   Specimen Description BLOOD LEFT ARM   Final    Special Requests BOTTLES DRAWN AEROBIC ONLY 10CC   Final    Culture  Setup Time 12/23/2011 03:56   Final    Culture     Final    Value:        BLOOD CULTURE RECEIVED NO GROWTH TO DATE CULTURE WILL BE HELD FOR 5 DAYS BEFORE ISSUING A FINAL NEGATIVE REPORT   Report Status PENDING   Incomplete   MRSA PCR SCREENING     Status: Normal    Collection Time   12/23/11 12:22 AM      Component Value Range Status Comment   MRSA by PCR NEGATIVE  NEGATIVE Final      Labs: Basic Metabolic Panel:  Lab 12/24/11 1610 12/23/11 0622 12/22/11 2212 12/22/11 1655  NA 139 138 -- 140  K 3.5 3.7 -- 4.2  CL 102 101 -- 106  CO2 27 26 -- 25  GLUCOSE 93 94 -- 98  BUN 13 11 -- 12  CREATININE 1.49* 1.50* 1.37* 1.38*  CALCIUM 8.8 9.0 -- 9.1  MG -- -- -- --  PHOS -- -- -- --   Liver Function Tests:  Lab 12/24/11 0543 12/23/11 0622 12/22/11 1655  AST 11 13 13   ALT 5 10 5   ALKPHOS 60 66 68  BILITOT 0.5 0.6 0.4  PROT 6.4 6.9 6.8  ALBUMIN 3.5 3.7 3.7   No results found for this basename: LIPASE:5,AMYLASE:5 in the last 168 hours No results found for  this basename: AMMONIA:5 in the last 168 hours CBC:  Lab 12/23/11 0622 12/22/11 2212 12/22/11 1655  WBC 7.4 7.2 7.3  NEUTROABS -- -- 5.2  HGB 10.4* 9.5* 10.3*  HCT 34.4* 31.4* 34.0*  MCV 78.2 78.7 79.1  PLT 245 203 218   Cardiac Enzymes:  Lab 12/22/11 1655  CKTOTAL --  CKMB --  CKMBINDEX --  TROPONINI <0.30   BNP: BNP (last 3 results)  Basename 10/05/11 0508 10/04/11 1647 07/12/11 0045  PROBNP 534.4* 656.8* 526.9*   CBG: No results found for this basename: GLUCAP:5 in the last 168 hours     Signed:  Rhetta Mura  [facility] 12/26/2011, 6:42 PM

## 2011-12-27 ENCOUNTER — Ambulatory Visit: Payer: Medicare Other | Admitting: Family Medicine

## 2011-12-29 ENCOUNTER — Emergency Department (HOSPITAL_COMMUNITY): Payer: Medicare Other

## 2011-12-29 ENCOUNTER — Telehealth: Payer: Self-pay | Admitting: Family Medicine

## 2011-12-29 ENCOUNTER — Inpatient Hospital Stay (HOSPITAL_COMMUNITY)
Admission: EM | Admit: 2011-12-29 | Discharge: 2011-12-31 | DRG: 641 | Disposition: A | Discharge status: Home Health | Payer: Medicare Other | Attending: Internal Medicine | Admitting: Internal Medicine

## 2011-12-29 ENCOUNTER — Encounter (HOSPITAL_COMMUNITY): Payer: Self-pay | Admitting: Neurology

## 2011-12-29 DIAGNOSIS — G20 Parkinson's disease: Secondary | ICD-10-CM | POA: Diagnosis present

## 2011-12-29 DIAGNOSIS — I200 Unstable angina: Secondary | ICD-10-CM

## 2011-12-29 DIAGNOSIS — R531 Weakness: Secondary | ICD-10-CM

## 2011-12-29 DIAGNOSIS — R5381 Other malaise: Secondary | ICD-10-CM

## 2011-12-29 DIAGNOSIS — Z79899 Other long term (current) drug therapy: Secondary | ICD-10-CM

## 2011-12-29 DIAGNOSIS — Z87898 Personal history of other specified conditions: Secondary | ICD-10-CM

## 2011-12-29 DIAGNOSIS — R079 Chest pain, unspecified: Secondary | ICD-10-CM

## 2011-12-29 DIAGNOSIS — K5730 Diverticulosis of large intestine without perforation or abscess without bleeding: Secondary | ICD-10-CM | POA: Diagnosis present

## 2011-12-29 DIAGNOSIS — Z9889 Other specified postprocedural states: Secondary | ICD-10-CM

## 2011-12-29 DIAGNOSIS — R1319 Other dysphagia: Secondary | ICD-10-CM | POA: Diagnosis present

## 2011-12-29 DIAGNOSIS — F0280 Dementia in other diseases classified elsewhere without behavioral disturbance: Secondary | ICD-10-CM | POA: Diagnosis present

## 2011-12-29 DIAGNOSIS — I513 Intracardiac thrombosis, not elsewhere classified: Secondary | ICD-10-CM

## 2011-12-29 DIAGNOSIS — R1310 Dysphagia, unspecified: Secondary | ICD-10-CM | POA: Diagnosis present

## 2011-12-29 DIAGNOSIS — K5900 Constipation, unspecified: Secondary | ICD-10-CM | POA: Diagnosis present

## 2011-12-29 DIAGNOSIS — R627 Adult failure to thrive: Principal | ICD-10-CM | POA: Diagnosis present

## 2011-12-29 DIAGNOSIS — F329 Major depressive disorder, single episode, unspecified: Secondary | ICD-10-CM

## 2011-12-29 DIAGNOSIS — I2510 Atherosclerotic heart disease of native coronary artery without angina pectoris: Secondary | ICD-10-CM | POA: Diagnosis present

## 2011-12-29 DIAGNOSIS — I4891 Unspecified atrial fibrillation: Secondary | ICD-10-CM | POA: Diagnosis present

## 2011-12-29 DIAGNOSIS — J45909 Unspecified asthma, uncomplicated: Secondary | ICD-10-CM | POA: Diagnosis present

## 2011-12-29 DIAGNOSIS — Z86711 Personal history of pulmonary embolism: Secondary | ICD-10-CM

## 2011-12-29 DIAGNOSIS — Z7982 Long term (current) use of aspirin: Secondary | ICD-10-CM

## 2011-12-29 DIAGNOSIS — F068 Other specified mental disorders due to known physiological condition: Secondary | ICD-10-CM

## 2011-12-29 DIAGNOSIS — G20A1 Parkinson's disease without dyskinesia, without mention of fluctuations: Secondary | ICD-10-CM | POA: Diagnosis present

## 2011-12-29 DIAGNOSIS — R0789 Other chest pain: Secondary | ICD-10-CM | POA: Diagnosis present

## 2011-12-29 DIAGNOSIS — G252 Other specified forms of tremor: Secondary | ICD-10-CM | POA: Diagnosis present

## 2011-12-29 DIAGNOSIS — I5040 Unspecified combined systolic (congestive) and diastolic (congestive) heart failure: Secondary | ICD-10-CM

## 2011-12-29 DIAGNOSIS — Z860100 Personal history of colon polyps, unspecified: Secondary | ICD-10-CM

## 2011-12-29 DIAGNOSIS — Z951 Presence of aortocoronary bypass graft: Secondary | ICD-10-CM

## 2011-12-29 DIAGNOSIS — Z86718 Personal history of other venous thrombosis and embolism: Secondary | ICD-10-CM

## 2011-12-29 DIAGNOSIS — G250 Essential tremor: Secondary | ICD-10-CM | POA: Diagnosis present

## 2011-12-29 DIAGNOSIS — I809 Phlebitis and thrombophlebitis of unspecified site: Secondary | ICD-10-CM

## 2011-12-29 DIAGNOSIS — I10 Essential (primary) hypertension: Secondary | ICD-10-CM | POA: Diagnosis present

## 2011-12-29 DIAGNOSIS — D320 Benign neoplasm of cerebral meninges: Secondary | ICD-10-CM

## 2011-12-29 DIAGNOSIS — I82409 Acute embolism and thrombosis of unspecified deep veins of unspecified lower extremity: Secondary | ICD-10-CM

## 2011-12-29 DIAGNOSIS — J690 Pneumonitis due to inhalation of food and vomit: Secondary | ICD-10-CM

## 2011-12-29 DIAGNOSIS — N400 Enlarged prostate without lower urinary tract symptoms: Secondary | ICD-10-CM | POA: Diagnosis present

## 2011-12-29 DIAGNOSIS — R262 Difficulty in walking, not elsewhere classified: Secondary | ICD-10-CM | POA: Diagnosis present

## 2011-12-29 DIAGNOSIS — K219 Gastro-esophageal reflux disease without esophagitis: Secondary | ICD-10-CM | POA: Diagnosis present

## 2011-12-29 DIAGNOSIS — I951 Orthostatic hypotension: Secondary | ICD-10-CM

## 2011-12-29 DIAGNOSIS — E785 Hyperlipidemia, unspecified: Secondary | ICD-10-CM | POA: Diagnosis present

## 2011-12-29 DIAGNOSIS — R072 Precordial pain: Secondary | ICD-10-CM

## 2011-12-29 DIAGNOSIS — J309 Allergic rhinitis, unspecified: Secondary | ICD-10-CM

## 2011-12-29 DIAGNOSIS — Z7902 Long term (current) use of antithrombotics/antiplatelets: Secondary | ICD-10-CM

## 2011-12-29 LAB — CBC WITH DIFFERENTIAL/PLATELET
Basophils Absolute: 0 10*3/uL (ref 0.0–0.1)
Basophils Relative: 0 % (ref 0–1)
Eosinophils Absolute: 0.2 10*3/uL (ref 0.0–0.7)
Eosinophils Relative: 2 % (ref 0–5)
HCT: 30.2 % — ABNORMAL LOW (ref 39.0–52.0)
Hemoglobin: 9.3 g/dL — ABNORMAL LOW (ref 13.0–17.0)
Lymphocytes Relative: 15 % (ref 12–46)
Lymphs Abs: 1.1 10*3/uL (ref 0.7–4.0)
MCH: 24.3 pg — ABNORMAL LOW (ref 26.0–34.0)
MCHC: 30.8 g/dL (ref 30.0–36.0)
MCV: 79.1 fL (ref 78.0–100.0)
Monocytes Absolute: 0.7 10*3/uL (ref 0.1–1.0)
Monocytes Relative: 9 % (ref 3–12)
Neutro Abs: 5.4 10*3/uL (ref 1.7–7.7)
Neutrophils Relative %: 74 % (ref 43–77)
Platelets: 168 10*3/uL (ref 150–400)
RBC: 3.82 MIL/uL — ABNORMAL LOW (ref 4.22–5.81)
RDW: 16.4 % — ABNORMAL HIGH (ref 11.5–15.5)
WBC: 7.3 10*3/uL (ref 4.0–10.5)

## 2011-12-29 LAB — CBC
HCT: 29.9 % — ABNORMAL LOW (ref 39.0–52.0)
MCHC: 31.1 g/dL (ref 30.0–36.0)
MCV: 78.3 fL (ref 78.0–100.0)
RDW: 16.4 % — ABNORMAL HIGH (ref 11.5–15.5)
WBC: 6 10*3/uL (ref 4.0–10.5)

## 2011-12-29 LAB — COMPREHENSIVE METABOLIC PANEL
ALT: 5 U/L (ref 0–53)
AST: 13 U/L (ref 0–37)
Albumin: 3.4 g/dL — ABNORMAL LOW (ref 3.5–5.2)
Alkaline Phosphatase: 64 U/L (ref 39–117)
BUN: 11 mg/dL (ref 6–23)
CO2: 24 mEq/L (ref 19–32)
Calcium: 8.9 mg/dL (ref 8.4–10.5)
Chloride: 106 mEq/L (ref 96–112)
Creatinine, Ser: 1.17 mg/dL (ref 0.50–1.35)
GFR calc Af Amer: 71 mL/min — ABNORMAL LOW (ref >90)
GFR calc non Af Amer: 61 mL/min — ABNORMAL LOW (ref >90)
Glucose, Bld: 104 mg/dL — ABNORMAL HIGH (ref 70–99)
Potassium: 4.6 mEq/L (ref 3.5–5.1)
Sodium: 140 mEq/L (ref 135–145)
Total Bilirubin: 0.5 mg/dL (ref 0.3–1.2)
Total Protein: 6.4 g/dL (ref 6.0–8.3)

## 2011-12-29 LAB — URINALYSIS, ROUTINE W REFLEX MICROSCOPIC
Bilirubin Urine: NEGATIVE
Glucose, UA: NEGATIVE mg/dL
Hgb urine dipstick: NEGATIVE
Ketones, ur: 15 mg/dL — AB
Nitrite: NEGATIVE
Protein, ur: 30 mg/dL — AB
Specific Gravity, Urine: 1.024 (ref 1.005–1.030)
Urobilinogen, UA: 0.2 mg/dL (ref 0.0–1.0)
pH: 7.5 (ref 5.0–8.0)

## 2011-12-29 LAB — CULTURE, BLOOD (ROUTINE X 2): Culture: NO GROWTH

## 2011-12-29 LAB — URINE MICROSCOPIC-ADD ON

## 2011-12-29 LAB — CK TOTAL AND CKMB (NOT AT ARMC)
CK, MB: 1.7 ng/mL (ref 0.3–4.0)
Relative Index: INVALID (ref 0.0–2.5)

## 2011-12-29 LAB — CREATININE, SERUM: GFR calc Af Amer: 73 mL/min — ABNORMAL LOW (ref >90)

## 2011-12-29 LAB — TROPONIN I: Troponin I: <0.3 ng/mL (ref <0.30)

## 2011-12-29 MED ORDER — PANTOPRAZOLE SODIUM 40 MG PO TBEC
40.0000 mg | DELAYED_RELEASE_TABLET | Freq: Two times a day (BID) | ORAL | Status: DC
  Administered 2011-12-29 – 2011-12-31 (×4): 40 mg via ORAL
  Filled 2011-12-29 (×3): qty 1

## 2011-12-29 MED ORDER — TEMAZEPAM 15 MG PO CAPS: 30.0000 mg | ORAL_CAPSULE | Freq: Every evening | ORAL | Status: DC | PRN

## 2011-12-29 MED ORDER — ADULT MULTIVITAMIN W/MINERALS CH
1.0000 | ORAL_TABLET | Freq: Every day | ORAL | Status: DC
  Administered 2011-12-29 – 2011-12-31 (×3): 1 via ORAL
  Filled 2011-12-29 (×3): qty 1

## 2011-12-29 MED ORDER — AMIODARONE HCL 200 MG PO TABS
200.0000 mg | ORAL_TABLET | Freq: Every day | ORAL | Status: DC
  Administered 2011-12-29 – 2011-12-31 (×3): 200 mg via ORAL
  Filled 2011-12-29 (×3): qty 1

## 2011-12-29 MED ORDER — PANTOPRAZOLE SODIUM 40 MG PO TBEC: 40.0000 mg | DELAYED_RELEASE_TABLET | Freq: Every day | ORAL | Status: DC

## 2011-12-29 MED ORDER — HYDROCODONE-ACETAMINOPHEN 5-325 MG PO TABS: 1.0000 | ORAL_TABLET | ORAL | Status: DC | PRN

## 2011-12-29 MED ORDER — ACETAMINOPHEN 325 MG PO TABS: 650.0000 mg | ORAL_TABLET | Freq: Four times a day (QID) | ORAL | Status: DC | PRN

## 2011-12-29 MED ORDER — ONDANSETRON HCL 4 MG/2ML IJ SOLN: 4.0000 mg | Freq: Four times a day (QID) | INTRAMUSCULAR | Status: DC | PRN

## 2011-12-29 MED ORDER — CARBIDOPA-LEVODOPA 25-100 MG PO TABS
1.0000 | ORAL_TABLET | Freq: Three times a day (TID) | ORAL | Status: DC
  Administered 2011-12-29 – 2011-12-31 (×5): 1 via ORAL
  Filled 2011-12-29 (×9): qty 1

## 2011-12-29 MED ORDER — ALUM & MAG HYDROXIDE-SIMETH 200-200-20 MG/5ML PO SUSP
30.0000 mL | Freq: Four times a day (QID) | ORAL | Status: DC | PRN
Start: 2011-12-29 — End: 2011-12-31

## 2011-12-29 MED ORDER — ISOSORBIDE MONONITRATE 15 MG HALF TABLET: 7.5000 mg | ORAL_TABLET | Freq: Every day | ORAL | Status: DC

## 2011-12-29 MED ORDER — SODIUM CHLORIDE 0.9 % IJ SOLN
3.0000 mL | Freq: Two times a day (BID) | INTRAMUSCULAR | Status: DC
  Administered 2011-12-29 – 2011-12-30 (×2): 3 mL via INTRAVENOUS

## 2011-12-29 MED ORDER — LEVOTHYROXINE SODIUM 75 MCG PO TABS
75.0000 ug | ORAL_TABLET | Freq: Every day | ORAL | Status: DC
  Administered 2011-12-30 – 2011-12-31 (×2): 75 ug via ORAL
  Filled 2011-12-29 (×3): qty 1

## 2011-12-29 MED ORDER — HYDROMORPHONE HCL PF 1 MG/ML IJ SOLN: 1.0000 mg | INTRAMUSCULAR | Status: DC | PRN

## 2011-12-29 MED ORDER — ACETAMINOPHEN 650 MG RE SUPP: 650.0000 mg | Freq: Four times a day (QID) | RECTAL | Status: DC | PRN

## 2011-12-29 MED ORDER — TAMSULOSIN HCL 0.4 MG PO CAPS
0.4000 mg | ORAL_CAPSULE | Freq: Every morning | ORAL | Status: DC
  Administered 2011-12-29 – 2011-12-31 (×3): 0.4 mg via ORAL
  Filled 2011-12-29 (×3): qty 1

## 2011-12-29 MED ORDER — ENOXAPARIN SODIUM 40 MG/0.4ML ~~LOC~~ SOLN
40.0000 mg | SUBCUTANEOUS | Status: DC
  Administered 2011-12-29 – 2011-12-30 (×2): 40 mg via SUBCUTANEOUS
  Filled 2011-12-29 (×4): qty 0.4

## 2011-12-29 MED ORDER — ISOSORBIDE MONONITRATE 15 MG HALF TABLET
15.0000 mg | ORAL_TABLET | Freq: Every day | ORAL | Status: DC
  Administered 2011-12-29 – 2011-12-31 (×3): 15 mg via ORAL
  Filled 2011-12-29 (×3): qty 1

## 2011-12-29 MED ORDER — BISACODYL 10 MG RE SUPP
10.0000 mg | Freq: Every day | RECTAL | Status: DC
  Administered 2011-12-29 – 2011-12-30 (×2): 10 mg via RECTAL
  Filled 2011-12-29 (×2): qty 1

## 2011-12-29 MED ORDER — LACTULOSE 10 GM/15ML PO SOLN
10.0000 g | Freq: Every day | ORAL | Status: DC | PRN
  Filled 2011-12-29: qty 15

## 2011-12-29 MED ORDER — RISPERIDONE 1 MG PO TABS
1.0000 mg | ORAL_TABLET | Freq: Every day | ORAL | Status: DC
  Administered 2011-12-29 – 2011-12-30 (×2): 1 mg via ORAL
  Filled 2011-12-29 (×4): qty 1

## 2011-12-29 MED ORDER — POLYETHYLENE GLYCOL 3350 17 G PO PACK
17.0000 g | PACK | Freq: Four times a day (QID) | ORAL | Status: DC
  Administered 2011-12-29 – 2011-12-31 (×4): 17 g via ORAL
  Filled 2011-12-29 (×10): qty 1

## 2011-12-29 MED ORDER — NITROGLYCERIN 0.4 MG SL SUBL: 0.4000 mg | SUBLINGUAL_TABLET | SUBLINGUAL | Status: DC | PRN

## 2011-12-29 MED ORDER — ZOLPIDEM TARTRATE 5 MG PO TABS: 5.0000 mg | ORAL_TABLET | Freq: Every evening | ORAL | Status: DC | PRN

## 2011-12-29 MED ORDER — SODIUM CHLORIDE 0.9 % IV SOLN
INTRAVENOUS | Status: DC
  Administered 2011-12-29 – 2011-12-31 (×2): via INTRAVENOUS

## 2011-12-29 MED ORDER — CLONAZEPAM 1 MG PO TABS
1.0000 mg | ORAL_TABLET | Freq: Four times a day (QID) | ORAL | Status: DC
  Administered 2011-12-29 – 2011-12-31 (×7): 1 mg via ORAL
  Filled 2011-12-29 (×7): qty 1

## 2011-12-29 MED ORDER — SIMVASTATIN 20 MG PO TABS
20.0000 mg | ORAL_TABLET | Freq: Every day | ORAL | Status: DC
  Administered 2011-12-30: 20 mg via ORAL
  Filled 2011-12-29 (×2): qty 1

## 2011-12-29 MED ORDER — ONDANSETRON HCL 4 MG PO TABS: 4.0000 mg | ORAL_TABLET | Freq: Four times a day (QID) | ORAL | Status: DC | PRN

## 2011-12-29 MED ORDER — ASPIRIN EC 325 MG PO TBEC
325.0000 mg | DELAYED_RELEASE_TABLET | Freq: Every day | ORAL | Status: DC
  Administered 2011-12-29 – 2011-12-31 (×3): 325 mg via ORAL
  Filled 2011-12-29 (×3): qty 1

## 2011-12-29 MED ORDER — LACTULOSE ENCEPHALOPATHY 10 GM/15ML PO SOLN: 10.0000 g | Freq: Every day | ORAL | Status: DC | PRN

## 2011-12-29 NOTE — ED Provider Notes (Signed)
History     CSN: [id number]  Arrival date & time 12/29/11  1249   First MD Initiated Contact with Patient 12/29/11 1306      Chief Complaint  Patient presents with  . Weakness    (Consider location/radiation/quality/duration/timing/severity/associated sxs/prior treatment) HPI The patient presents with continued weakness and chest pain. The patient states that the weakness has continued to worsen to where is can't walk well. The patient was placed on new parkinsons medications. The patient states he had chest pain this morning that was relieved by 2 nitro. The patient denies headache, vomiting, SOB, visual changes, fever, cough, abdominal pain, dizziness, syncope, or nausea. The weakness and difficulty walking have been a progressive issue over several weeks. Past Medical History  Diagnosis Date  . PE (pulmonary embolism)     a. >20 years ago;  b. nl V:Q scan 10/2010  . DVT (deep venous thrombosis) 01/2007    bilateral  . Meningioma   . BPH (benign prostatic hyperplasia)   . Personal history of colonic polyps   . Venereal wart   . Depression   . Asthma   . Chickenpox   . Thrombophlebitis   . GERD (gastroesophageal reflux disease)   . Parkinson disease   . Dementia   . CAD (coronary artery disease)     a. 08/2009 s/p CABG x 2 (LIMA->LAD, SVG-OM). b. Adm w/ CP 09/2011 - questionable functionality of LIMA graft but patient declined further catheterization (cath 10/05/11 -- ostial LAD disease, possible flap in prox L subclavian/unable to image LIMA well, atretic SVG-OM)  . Hyperlipidemia   . Stricture and stenosis of esophagus   . Hiatal hernia   . Diverticulosis of colon (without mention of hemorrhage)   . Mitral regurgitation     a. 08/2009 s/p MV repair 22mm Edwards ring and oversewing of LA appendage @ time of CABG;  b. cardiac MRI 09/2011 showing some disruption of lateral annuloplasty ring with "perivalvular" MR and some central MR   . Thrombus of left atrial appendage     a.  Abnormal dye staining on cath 09/2011 - imaged by CT/MRI/TEE - felt to be surgically isolated from the LA  . Anxiety   . Stroke   . Atrial fibrillation     a. Paroxysmal, not Coumadin candidate 2/2 Parkinson's/gait instability  . Brain tumor     "right side of my brain" (12/22/2011)  . Arthritis     "very little" (12/22/2011)  . Pulmonary embolism 01/2007  . Aspiration pneumonia 03/2008    Past Surgical History  Procedure Date  . Varicose vein surgery     left leg  . Mitral valve repair 08/2009  . Umbilical hernia repair   . Tee without cardioversion 10/06/2011    Procedure: TRANSESOPHAGEAL ECHOCARDIOGRAM (TEE);  Surgeon: Vesta Mixer, MD;  Location: [facility] ENDOSCOPY;  Service: Cardiovascular;  Laterality: N/A;  . Cardiac catheterization   . Esophagogastroduodenoscopy 11/15/2011    Procedure: ESOPHAGOGASTRODUODENOSCOPY (EGD);  Surgeon: Meryl Dare, MD,FACG;  Location: [facility] ENDOSCOPY;  Service: Endoscopy;  Laterality: N/A;  . Tonsillectomy and adenoidectomy   . Mastoidectomy     "right; I'm really not sure what they did to my mastoid" (12/22/2011)  . Inguinal hernia repair     right  . Lumbar disc surgery     "Dr. Jeral Fruit; herniated disc & remove bone spurs"  . Back surgery   . Coronary artery bypass graft 08/2009    CABG X2    Family History  Problem Relation Age  of Onset  . Arthritis    . Diabetes    . Stroke Father   . Heart disease Mother     History  Substance Use Topics  . Smoking status: Former Smoker -- 1.0 packs/day for 10 years    Types: Cigarettes    Quit date: 02/22/1953  . Smokeless tobacco: Never Used  . Alcohol Use: No      Review of Systems  Allergies  Ibuprofen  Home Medications   Current Outpatient Rx  Name  Route  Sig  Dispense  Refill  . AMIODARONE HCL 200 MG PO TABS   Oral   Take 1 tablet (200 mg total) by mouth daily.         . ASPIRIN EC 325 MG PO TBEC   Oral   Take 325 mg by mouth daily.         Marland Kitchen BISACODYL 10 MG RE SUPP    Rectal   Place 10 mg rectally as needed. For upset stomach         . CARBIDOPA-LEVODOPA 25-100 MG PO TABS   Oral   Take 1 tablet by mouth 3 (three) times daily.          Marland Kitchen CLONAZEPAM 1 MG PO TABS   Oral   Take 1 tablet (1 mg total) by mouth every 6 (six) hours.   120 tablet   5   . HYDROCORTISONE 1 % EX CREA   Topical   Apply 1 application topically 2 (two) times daily as needed. For skin irritation when sweating         . ISOSORBIDE MONONITRATE 15 MG HALF TABLET   Oral   Take 0.5 tablets (15 mg total) by mouth daily.   30 tablet   11   . LACTULOSE ENCEPHALOPATHY 10 GM/15ML PO SOLN   Oral   Take 10 g by mouth daily as needed. For soft stools per wife         . LEVOTHYROXINE SODIUM 75 MCG PO TABS   Oral   Take 75 mcg by mouth daily.         . ADULT MULTIVITAMIN W/MINERALS CH   Oral   Take 1 tablet by mouth daily.         Marland Kitchen NITROGLYCERIN 0.4 MG SL SUBL   Sublingual   Place 0.4 mg under the tongue every 5 (five) minutes as needed. For chest pain         . PANTOPRAZOLE SODIUM 40 MG PO TBEC   Oral   Take 40 mg by mouth 2 (two) times daily.         Marland Kitchen POLYETHYLENE GLYCOL 3350 PO PACK   Oral   Take 17 g by mouth 4 (four) times daily.          Marland Kitchen PRAVASTATIN SODIUM 40 MG PO TABS   Oral   Take 40 mg by mouth at bedtime.          Marland Kitchen RISPERIDONE 1 MG PO TABS   Oral   Take 1 mg by mouth at bedtime.         Marland Kitchen SELEGILINE HCL 5 MG PO TABS   Oral   Take 1 tablet (5 mg total) by mouth daily before breakfast.   30 tablet   0   . TAMSULOSIN HCL 0.4 MG PO CAPS   Oral   Take 0.4 mg by mouth every morning.          Marland Kitchen TEMAZEPAM 30 MG PO CAPS  Oral   Take 30 mg by mouth at bedtime as needed. For sleep           BP 109/63  Pulse 72  Temp 97.7 F (36.5 C) (Oral)  Resp 14  SpO2 98%  Physical Exam  Constitutional: He is oriented to person, place, and time. He appears well-developed and well-nourished.  HENT:  Head: Normocephalic and  atraumatic.  Mouth/Throat: Oropharynx is clear and moist.  Neck: Normal range of motion. Neck supple.  Cardiovascular: Normal rate, regular rhythm and normal heart sounds.  Exam reveals no gallop and no friction rub.   No murmur heard. Pulmonary/Chest: Effort normal and breath sounds normal.  Abdominal: Soft. Bowel sounds are normal.  Neurological: He is alert and oriented to person, place, and time. He has normal strength. He displays tremor. No sensory deficit. He exhibits normal muscle tone. Coordination normal.  Skin: Skin is warm and dry.    ED Course  Procedures (including critical care time)  Labs Reviewed  CBC WITH DIFFERENTIAL - Abnormal; Notable for the following:    RBC 3.82 (*)     Hemoglobin 9.3 (*)     HCT 30.2 (*)     MCH 24.3 (*)     RDW 16.4 (*)     All other components within normal limits  COMPREHENSIVE METABOLIC PANEL - Abnormal; Notable for the following:    Glucose, Bld 104 (*)     Albumin 3.4 (*)     GFR calc non Af Amer 61 (*)     GFR calc Af Amer 71 (*)     All other components within normal limits  URINALYSIS, ROUTINE W REFLEX MICROSCOPIC - Abnormal; Notable for the following:    Color, Urine AMBER (*)  BIOCHEMICALS MAY BE AFFECTED BY COLOR   APPearance CLOUDY (*)     Ketones, ur 15 (*)     Protein, ur 30 (*)     Leukocytes, UA SMALL (*)     All other components within normal limits  URINE MICROSCOPIC-ADD ON - Abnormal; Notable for the following:    Bacteria, UA FEW (*)     Casts GRANULAR CAST (*)     All other components within normal limits  TROPONIN I   Dg Chest 2 View  12/29/2011  *RADIOLOGY REPORT*  Clinical Data: Chest pain  CHEST - 2 VIEW  Comparison: 12/23/2011  Findings: Previous median sternotomy and CABG procedure.  There is mild cardiac enlargement.  Scarring within the right midlung and right base is again noted.Blunting of the costophrenic angle is identified which appears similar to previous exam.  IMPRESSION:  1.  Pleural parenchymal  scarring within the right midlung and right base. 2.  Chronic blunting of the right costophrenic angle which may represent scarring and/or pleural fluid. 3.  Cardiac enlargement.   Original Report Authenticated By: Signa Kell, M.D.    Ct Head Wo Contrast  12/29/2011  *RADIOLOGY REPORT*  Clinical Data: Weakness  CT HEAD WITHOUT CONTRAST  Technique:  Contiguous axial images were obtained from the base of the skull through the vertex without contrast.  Comparison: 11/17/2010 and brain MRI 12/23/2011  Findings: No skull fracture is noted.  The paranasal sinuses and mastoid air cells are unremarkable.  Atherosclerotic calcifications of carotid siphon again noted.  No intracranial hemorrhage, mass effect or midline shift.  No acute infarction.  No mass lesion is noted on this unenhanced scan.  Stable mild cerebral atrophy.  IMPRESSION: No acute intracranial abnormality.  Mild cerebral atrophy  again noted.   Original Report Authenticated By: Natasha Mead, M.D.     Patient to be admitted. The patient will need placement most likely for rehab as well.    MDM  MDM Reviewed: vitals and nursing note Reviewed previous: labs Interpretation: labs Consults: admitting MD            Carlyle Dolly, PA-C 12/29/11 1702  Carlyle Dolly, PA-C 12/29/11 1704

## 2011-12-29 NOTE — Telephone Encounter (Signed)
Caller: Ronda/Patient; Patient Name: [name]; PCP: Gershon Crane [facility]); Best Callback Phone Number: [phone]; Reason for call: Other   got out of hospital 12-24-11 had been in for pneumonia.  she said he is very weak, not able to brush teeth and she had to feed him this AM. She said they have [place] coming out he has OT, PT and a nurse.  He is having trouble urinating, he is having urgency and is going just small amts  He was in hospital 10-30 to 12-24-11 and did have a catheter in during his stay.  He has not back or abdominal pain and no fever.  All emergent sxs per Urinary Symptoms protocols ruled out except for current or recent urinary tract instrumentation.  and urinary tract symptoms or no urine flow identified.  She states she can not get him into the office.    Could you please see how he can be taken care of since she can't get him in.  His home health nurse is Valentino Saxon [phone]

## 2011-12-29 NOTE — ED Notes (Signed)
Patient transported to X-ray and CT 

## 2011-12-29 NOTE — ED Notes (Signed)
PER EMS pt was dx with pneumonia last week, continued weakness till now. Contacted PCP and told to come to ED to get check out. Vital signs. BP 94/60 HR 82 CBG 117 O2sats 97/RA  22G rt. hanf given bolus. BP 106/74

## 2011-12-29 NOTE — Telephone Encounter (Signed)
[name] from [place] needs verbal order for occupational therapy twice a wk for seven weeks for general strengthening and endurance for the patient

## 2011-12-29 NOTE — ED Notes (Signed)
MD at bedside.  Hospitalitis into see pt.

## 2011-12-29 NOTE — Consult Note (Signed)
NEURO HOSPITALIST CONSULT NOTE    Reason for Consult: Worsening tremor and unstable gait.  HPI:                                                                                                                                          [name] is an 71 y.o. male history Parkinson's disease, mild dementia, coronary disease, hyperlipidemia, stroke and atrial fibrillation without anticoagulation who was recently admitted to the hospital for pneumonia. Patient has been on Sinemet for Parkinson's disease 25-103 times per day. Selegiline was added during his last hospitalization. His wife thinks his tremors are considerably worse. Patient also indicates that his gait is less stable since he left the hospital, including with trying to ambulate with his walker. Selegiline was discontinued on admission today. CT scan of his head today showed no signs of acute stroke nor acute abnormality intracranially otherwise. Family history is negative for tremor.  Past Medical History  Diagnosis Date  . PE (pulmonary embolism)     a. >20 years ago;  b. nl V:Q scan 10/2010  . DVT (deep venous thrombosis) 01/2007    bilateral  . Meningioma   . BPH (benign prostatic hyperplasia)   . Personal history of colonic polyps   . Venereal wart   . Depression   . Asthma   . Chickenpox   . Thrombophlebitis   . GERD (gastroesophageal reflux disease)   . Parkinson disease   . Dementia   . CAD (coronary artery disease)     a. 08/2009 s/p CABG x 2 (LIMA->LAD, SVG-OM). b. Adm w/ CP 09/2011 - questionable functionality of LIMA graft but patient declined further catheterization (cath 10/05/11 -- ostial LAD disease, possible flap in prox L subclavian/unable to image LIMA well, atretic SVG-OM)  . Hyperlipidemia   . Stricture and stenosis of esophagus   . Hiatal hernia   . Diverticulosis of colon (without mention of hemorrhage)   . Mitral regurgitation     a. 08/2009 s/p MV repair 22mm Edwards ring and  oversewing of LA appendage @ time of CABG;  b. cardiac MRI 09/2011 showing some disruption of lateral annuloplasty ring with "perivalvular" MR and some central MR   . Thrombus of left atrial appendage     a. Abnormal dye staining on cath 09/2011 - imaged by CT/MRI/TEE - felt to be surgically isolated from the LA  . Anxiety   . Stroke   . Atrial fibrillation     a. Paroxysmal, not Coumadin candidate 2/2 Parkinson's/gait instability  . Brain tumor     "right side of my brain" (12/22/2011)  . Arthritis     "very little" (12/22/2011)  . Pulmonary embolism 01/2007  . Aspiration pneumonia 03/2008    Past Surgical History  Procedure Date  .  Varicose vein surgery     left leg  . Mitral valve repair 08/2009  . Umbilical hernia repair   . Tee without cardioversion 10/06/2011    Procedure: TRANSESOPHAGEAL ECHOCARDIOGRAM (TEE);  Surgeon: Vesta Mixer, MD;  Location: [facility] ENDOSCOPY;  Service: Cardiovascular;  Laterality: N/A;  . Cardiac catheterization   . Esophagogastroduodenoscopy 11/15/2011    Procedure: ESOPHAGOGASTRODUODENOSCOPY (EGD);  Surgeon: Meryl Dare, MD,FACG;  Location: [facility] ENDOSCOPY;  Service: Endoscopy;  Laterality: N/A;  . Tonsillectomy and adenoidectomy   . Mastoidectomy     "right; I'm really not sure what they did to my mastoid" (12/22/2011)  . Inguinal hernia repair     right  . Lumbar disc surgery     "Dr. Jeral Fruit; herniated disc & remove bone spurs"  . Back surgery   . Coronary artery bypass graft 08/2009    CABG X2    Family History  Problem Relation Age of Onset  . Arthritis    . Diabetes    . Stroke Father   . Heart disease Mother     Social History:  reports that he quit smoking about 58 years ago. His smoking use included Cigarettes. He has a 10 pack-year smoking history. He has never used smokeless tobacco. He reports that he does not drink alcohol or use illicit drugs.  Allergies  Allergen Reactions  . Ibuprofen Other (See Comments)    Stomach hurts     MEDICATIONS:                                                                                                                     Prior to Admission:  Prescriptions prior to admission  Medication Sig Dispense Refill  . amiodarone (PACERONE) 200 MG tablet Take 1 tablet (200 mg total) by mouth daily.      Marland Kitchen aspirin EC 325 MG tablet Take 325 mg by mouth daily.      . bisacodyl (DULCOLAX) 10 MG suppository Place 10 mg rectally as needed. For upset stomach      . carbidopa-levodopa (SINEMET) 25-100 MG per tablet Take 1 tablet by mouth 3 (three) times daily.       . clonazePAM (KLONOPIN) 1 MG tablet Take 1 tablet (1 mg total) by mouth every 6 (six) hours.  120 tablet  5  . hydrocortisone cream 1 % Apply 1 application topically 2 (two) times daily as needed. For skin irritation when sweating      . isosorbide mononitrate (IMDUR) 30 MG 24 hr tablet Take 15 mg by mouth daily.      Marland Kitchen lactulose, encephalopathy, (GENERLAC) 10 GM/15ML SOLN Take 10 g by mouth daily as needed. For soft stools per wife      . levothyroxine (SYNTHROID, LEVOTHROID) 75 MCG tablet Take 75 mcg by mouth daily.      . Multiple Vitamin (MULITIVITAMIN WITH MINERALS) TABS Take 1 tablet by mouth daily.      . nitroGLYCERIN (NITROSTAT) 0.4 MG SL tablet Place 0.4 mg under the tongue  every 5 (five) minutes as needed. For chest pain      . pantoprazole (PROTONIX) 40 MG tablet Take 40 mg by mouth 2 (two) times daily.      . polyethylene glycol (MIRALAX / GLYCOLAX) packet Take 17 g by mouth 4 (four) times daily.       . pravastatin (PRAVACHOL) 40 MG tablet Take 40 mg by mouth at bedtime.       . risperiDONE (RISPERDAL) 1 MG tablet Take 1 mg by mouth at bedtime.      . selegiline (ELDEPRYL) 5 MG tablet Take 1 tablet (5 mg total) by mouth daily before breakfast.  30 tablet  0  . Tamsulosin HCl (FLOMAX) 0.4 MG CAPS Take 0.4 mg by mouth every morning.       . temazepam (RESTORIL) 30 MG capsule Take 30 mg by mouth at bedtime as needed. For  sleep       Scheduled:   . amiodarone  200 mg Oral Daily  . aspirin EC  325 mg Oral Daily  . bisacodyl  10 mg Rectal Daily  . carbidopa-levodopa  1 tablet Oral TID  . clonazePAM  1 mg Oral Q6H  . enoxaparin (LOVENOX) injection  40 mg Subcutaneous Q24H  . isosorbide mononitrate  15 mg Oral Daily  . levothyroxine  75 mcg Oral QAC breakfast  . multivitamin with minerals  1 tablet Oral Daily  . pantoprazole  40 mg Oral BID  . polyethylene glycol  17 g Oral QID  . risperiDONE  1 mg Oral QHS  . simvastatin  20 mg Oral q1800  . sodium chloride  3 mL Intravenous Q12H  . Tamsulosin HCl  0.4 mg Oral q morning - 10a  . [DISCONTINUED] isosorbide mononitrate  15 mg Oral Daily  . [DISCONTINUED] pantoprazole  40 mg Oral Q0600   ZOX:[id number], acetaminophen, alum & mag hydroxide-simeth, HYDROcodone-acetaminophen, HYDROmorphone (DILAUDID) injection, lactulose, nitroGLYCERIN, ondansetron (ZOFRAN) IV, ondansetron, temazepam, zolpidem, [DISCONTINUED] lactulose (encephalopathy), [DISCONTINUED] temazepam   Blood pressure 134/76, pulse 77, temperature 97.7 F (36.5 C), temperature source Oral, resp. rate 13, SpO2 98.00%.   Neurologic Examination:                                                                                                      Mental Status: Alert, oriented, thought content appropriate.  Speech fluent without evidence of aphasia. Able to follow commands without difficulty. Cranial Nerves: II-Visual fields were normal. III/IV/VI-Pupils were equal and reacted. Extraocular movements were full and conjugate.    V/VII-no facial numbness and no facial weakness. VIII-normal. X-normal speech and symmetrical palatal movement. Motor: 5/5 bilaterally with normal tone and bulk; muscle tone was increased throughout and symmetrical; bilateral cogwheeling at the wrists; mild resting tremor of both upper extremities; moderately severe intention tremor of the head and to a lesser extent upper  extremities. Sensory: Normal throughout. Deep Tendon Reflexes: 1+ in the upper extremities and absent in the lower extremities and symmetric. Plantars: Mute bilaterally Cerebellar: finger-to-nose testing slightly impaired with intention tremor bilaterally.  Assessment/Plan: 1. Parkinson's disease of moderate severity. 2.  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 This is the more prominent of his tremors.  Recommendations: 1. Continue Sinemet 25-100 at current dose for now. 2. Agree with discontinuing selegiline. 3. Trial of Mysoline starting at 25 mg twice a day for benign essential tremor. 4. Physical therapy and occupational therapy consults. 5. Consider inpatient rehabilitation consult for short stay intensive rehabilitation for reconditioning.  Carin Hock. Roseanne Reno M.D. [facility] [phone]  12/29/2011, 10:07 PM

## 2011-12-29 NOTE — Telephone Encounter (Signed)
She needs to call EMS to take him to the ER again

## 2011-12-29 NOTE — ED Notes (Signed)
Spoke with pt re: SNF placement at the request of the ED PA.  Wife not at bedside.  Pt adamantly refusing SNF for either himself or his wife.  Pt maintains that he has been getting along fine until today when he became so weak.  Pt states that he gets his therapy at home through [facility] through which he gets OT, PT, ST, and a Charity fundraiser.  CSW specifically asked pt if he would be willing to go to a SNF for ST rehab and he refused.  Spoke with pt's RN, who believed that pt's wife/family may be interested in securing custodial care at home.  Pt being prepared to be transported to floor, will ask Unit CSW to re-visit pt/family re: d/c planning.

## 2011-12-29 NOTE — Telephone Encounter (Signed)
I spoke with pt's wife and she is going to call EMS.

## 2011-12-29 NOTE — H&P (Signed)
History and Physical       Hospital Admission Note Date: 12/29/2011  Patient name: [name] Medical record number: [id number] Date of birth: 18-Nov-1940 Age: 71 y.o. Gender: male PCP: Nelwyn Salisbury, MD    Chief Complaint:  chest pain this morning with increasing weakness, tremors, unable to walk  HPI: Patient is a 71 year old male with history of Parkinson's disease, coronary disease, hypertension, BPH, GERD, dementia was recently discharged from the hospital on 12/24/2011 after being admitted for pneumonia. History was obtained from the patient and his wife present in the room. Patient's wife was was frustrated that he is more weak and unable to care for him. Per his wife, his tremors has been getting worse, patient was started on Selegiline on Monday along with Sinemet with no improvement in his tremors. Per his wife is not able to feed himself and is at risk at falls. MRI brain was done during the previous admission which showed no acute infarct. Patient also had one episode of chest pain midsternal this morning around 8:45 am which was relieved by 2 nitroglycerin. At the time of my encounter patient had no chest pain.  Review of Systems:  Constitutional: Denies fever, chills, diaphoresis, appetite change and fatigue.  HEENT: Denies photophobia, eye pain, redness, hearing loss, ear pain, congestion, sore throat, rhinorrhea, sneezing, mouth sores, trouble swallowing, neck pain, neck stiffness and tinnitus.   Respiratory: Denies SOB, DOE, cough, and wheezing.   Cardiovascular: Denies palpitations and leg swelling.  Gastrointestinal: Denies nausea, vomiting, abdominal pain, diarrhea, constipation, blood in stool and abdominal distention.  Genitourinary: Denies dysuria, urgency, frequency, hematuria, flank pain. + difficulty urinating secondary to BPH.  Musculoskeletal: Denies myalgias, back pain, joint swelling, arthralgias and gait  problem.  Skin: Denies pallor, rash and wound.  Neurological: Please see history of present illness  Hematological: Denies adenopathy. Easy bruising, personal or family bleeding history  Psychiatric/Behavioral: Denies suicidal ideation, mood changes, confusion, nervousness, sleep disturbance and agitation  Past Medical History: Past Medical History  Diagnosis Date  . PE (pulmonary embolism)     a. >20 years ago;  b. nl V:Q scan 10/2010  . DVT (deep venous thrombosis) 01/2007    bilateral  . Meningioma   . BPH (benign prostatic hyperplasia)   . Personal history of colonic polyps   . Venereal wart   . Depression   . Asthma   . Chickenpox   . Thrombophlebitis   . GERD (gastroesophageal reflux disease)   . Parkinson disease   . Dementia   . CAD (coronary artery disease)     a. 08/2009 s/p CABG x 2 (LIMA->LAD, SVG-OM). b. Adm w/ CP 09/2011 - questionable functionality of LIMA graft but patient declined further catheterization (cath 10/05/11 -- ostial LAD disease, possible flap in prox L subclavian/unable to image LIMA well, atretic SVG-OM)  . Hyperlipidemia   . Stricture and stenosis of esophagus   . Hiatal hernia   . Diverticulosis of colon (without mention of hemorrhage)   . Mitral regurgitation     a. 08/2009 s/p MV repair 22mm Edwards ring and oversewing of LA appendage @ time of CABG;  b. cardiac MRI 09/2011 showing some disruption of lateral annuloplasty ring with "perivalvular" MR and some central MR   . Thrombus of left atrial appendage     a. Abnormal dye staining on cath 09/2011 - imaged by CT/MRI/TEE - felt to be surgically isolated from the LA  . Anxiety   . Stroke   . Atrial fibrillation  a. Paroxysmal, not Coumadin candidate 2/2 Parkinson's/gait instability  . Brain tumor     "right side of my brain" (12/22/2011)  . Arthritis     "very little" (12/22/2011)  . Pulmonary embolism 01/2007  . Aspiration pneumonia 03/2008   Past Surgical History  Procedure Date  .  Varicose vein surgery     left leg  . Mitral valve repair 08/2009  . Umbilical hernia repair   . Tee without cardioversion 10/06/2011    Procedure: TRANSESOPHAGEAL ECHOCARDIOGRAM (TEE);  Surgeon: Vesta Mixer, MD;  Location: [facility] ENDOSCOPY;  Service: Cardiovascular;  Laterality: N/A;  . Cardiac catheterization   . Esophagogastroduodenoscopy 11/15/2011    Procedure: ESOPHAGOGASTRODUODENOSCOPY (EGD);  Surgeon: Meryl Dare, MD,FACG;  Location: [facility] ENDOSCOPY;  Service: Endoscopy;  Laterality: N/A;  . Tonsillectomy and adenoidectomy   . Mastoidectomy     "right; I'm really not sure what they did to my mastoid" (12/22/2011)  . Inguinal hernia repair     right  . Lumbar disc surgery     "Dr. Jeral Fruit; herniated disc & remove bone spurs"  . Back surgery   . Coronary artery bypass graft 08/2009    CABG X2    Medications: Prior to Admission medications   Medication Sig Start Date End Date Taking? Authorizing Provider  amiodarone (PACERONE) 200 MG tablet Take 1 tablet (200 mg total) by mouth daily. 05/12/11  Yes Lewayne Bunting, MD  aspirin EC 325 MG tablet Take 325 mg by mouth daily. 10/22/11  Yes [name] G Barrett, PA  bisacodyl (DULCOLAX) 10 MG suppository Place 10 mg rectally as needed. For upset stomach   Yes Historical Provider, MD  carbidopa-levodopa (SINEMET) 25-100 MG per tablet Take 1 tablet by mouth 3 (three) times daily.  03/05/11  Yes Nelwyn Salisbury, MD  clonazePAM (KLONOPIN) 1 MG tablet Take 1 tablet (1 mg total) by mouth every 6 (six) hours. 12/16/11  Yes Nelwyn Salisbury, MD  hydrocortisone cream 1 % Apply 1 application topically 2 (two) times daily as needed. For skin irritation when sweating   Yes Historical Provider, MD  isosorbide mononitrate (IMDUR) 15 mg TB24 Take 0.5 tablets (15 mg total) by mouth daily. 12/16/11  Yes Nelwyn Salisbury, MD  lactulose, encephalopathy, (GENERLAC) 10 GM/15ML SOLN Take 10 g by mouth daily as needed. For soft stools per wife   Yes Historical Provider, MD    levothyroxine (SYNTHROID, LEVOTHROID) 75 MCG tablet Take 75 mcg by mouth daily. 11/02/11 11/01/12 Yes Nelwyn Salisbury, MD  Multiple Vitamin (MULITIVITAMIN WITH MINERALS) TABS Take 1 tablet by mouth daily.   Yes Historical Provider, MD  nitroGLYCERIN (NITROSTAT) 0.4 MG SL tablet Place 0.4 mg under the tongue every 5 (five) minutes as needed. For chest pain 10/08/11 10/07/12 Yes Dayna N Dunn, PA  pantoprazole (PROTONIX) 40 MG tablet Take 40 mg by mouth 2 (two) times daily.   Yes Historical Provider, MD  polyethylene glycol (MIRALAX / GLYCOLAX) packet Take 17 g by mouth 4 (four) times daily.  10/22/11  Yes [name] G Barrett, PA  pravastatin (PRAVACHOL) 40 MG tablet Take 40 mg by mouth at bedtime.    Yes Historical Provider, MD  risperiDONE (RISPERDAL) 1 MG tablet Take 1 mg by mouth at bedtime. 11/08/11  Yes Nelwyn Salisbury, MD  selegiline (ELDEPRYL) 5 MG tablet Take 1 tablet (5 mg total) by mouth daily before breakfast. 12/24/11  Yes Rhetta Mura, MD  Tamsulosin HCl (FLOMAX) 0.4 MG CAPS Take 0.4 mg by mouth every morning.  Yes Historical Provider, MD  temazepam (RESTORIL) 30 MG capsule Take 30 mg by mouth at bedtime as needed. For sleep 11/11/11  Yes Nelwyn Salisbury, MD    Allergies:   Allergies  Allergen Reactions  . Ibuprofen Other (See Comments)    Stomach hurts    Social History:  reports that he quit smoking about 58 years ago. His smoking use included Cigarettes. He has a 10 pack-year smoking history. He has never used smokeless tobacco. He reports that he does not drink alcohol or use illicit drugs. She lives at home with his wife and was ambulating some with a walker.  Family History: Family History  Problem Relation Age of Onset  . Arthritis    . Diabetes    . Stroke Father   . Heart disease Mother     Physical Exam: Blood pressure 118/70, pulse 73, temperature 97.7 F (36.5 C), temperature source Oral, resp. rate 13, SpO2 98.00%. General: Alert, awake, oriented x3, in no acute  distress. HEENT: normocephalic, atraumatic, anicteric sclera, pink conjunctiva, pupils equal and reactive to light and accomodation, oropharynx clear Neck: supple, no masses or lymphadenopathy, no goiter, no bruits  Heart: Regular rate and rhythm, without murmurs, rubs or gallops. Lungs: Clear to auscultation bilaterally, no wheezing, rales or rhonchi. Abdomen: Soft, nontender, nondistended, positive bowel sounds, no masses. Extremities: No clubbing, cyanosis or edema with positive pedal pulses. Neuro: Grossly intact, no focal neurological deficits, strength 5/5 upper and lower extremities bilaterally, resting tremors with bobbing of the head. Gait not assessed Psych: alert and oriented x 3, normal mood and affect Skin: no rashes or lesions, warm and dry   LABS on Admission:  Basic Metabolic Panel:  Lab 12/29/11 6578 12/24/11 0543  NA 140 139  K 4.6 3.5  CL 106 102  CO2 24 27  GLUCOSE 104* 93  BUN 11 13  CREATININE 1.17 1.49*  CALCIUM 8.9 8.8  MG -- --  PHOS -- --   Liver Function Tests:  Lab 12/29/11 1332 12/24/11 0543  AST 13 11  ALT 5 <5  ALKPHOS 64 60  BILITOT 0.5 0.5  PROT 6.4 6.4  ALBUMIN 3.4* 3.5   No results found for this basename: LIPASE:2,AMYLASE:2 in the last 168 hours No results found for this basename: AMMONIA:2 in the last 168 hours CBC:  Lab 12/29/11 1332 12/23/11 0622  WBC 7.3 7.4  NEUTROABS 5.4 --  HGB 9.3* 10.4*  HCT 30.2* 34.4*  MCV 79.1 --  PLT 168 245   Cardiac Enzymes:  Lab 12/29/11 1330  CKTOTAL --  CKMB --  CKMBINDEX --  TROPONINI <0.30   BNP: No components found with this basename: POCBNP:2 CBG: No results found for this basename: GLUCAP:2 in the last 168 hours   Radiological Exams on Admission: Dg Chest 2 View  12/29/2011  *RADIOLOGY REPORT*  Clinical Data: Chest pain  CHEST - 2 VIEW  Comparison: 12/23/2011  Findings: Previous median sternotomy and CABG procedure.  There is mild cardiac enlargement.  Scarring within the right  midlung and right base is again noted.Blunting of the costophrenic angle is identified which appears similar to previous exam.  IMPRESSION:  1.  Pleural parenchymal scarring within the right midlung and right base. 2.  Chronic blunting of the right costophrenic angle which may represent scarring and/or pleural fluid. 3.  Cardiac enlargement.   Original Report Authenticated By: Signa Kell, M.D.    Dg Chest 2 View  12/23/2011  *RADIOLOGY REPORT*  Clinical Data: Shortness of  breath and pneumonia.  History of asthma.  CHEST - 2 VIEW  Comparison: 12/22/2011  Findings:   Marked hyperinflation/COPD.  Median sternotomy and mitral valve repair.  Artifact degraded lateral view.  Mild cardiomegaly.  Small volume right-sided posterior pleural fluid and thickening.  No pneumothorax.  Multifocal pleural parenchymal scarring, greater right than left.  Improved right base aeration.  IMPRESSION: 1.  Improved right base air space disease. 2.  Hyperinflation/COPD with bilateral pleural parenchymal scarring. 3.  Similar posterior right-sided pleural thickening/fluid.   Original Report Authenticated By: Jeronimo Greaves, M.D.    Dg Chest 2 View  12/22/2011  *RADIOLOGY REPORT*  Clinical Data: Previous CABG.  Weakness.  CHEST - 2 VIEW  Comparison: 11/01/2011  Findings: There has been previous median sternotomy, CABG and mitral valve replacement.  The left lung shows mild scarring.  The right lung shows moderate chronic pleural and parenchymal scarring the appearance is essentially unchanged since previous studies. One could question if there is some patchy infiltrate at the medial right base however.  No acute bony finding.  No evidence of pulmonary edema.  IMPRESSION: Previous CABG and mitral valve replacement.  Pleural and parenchymal scarring much more extensive on the right than the left.  Similar appearance to multiple previous exams. I question if there could be increased patchy density in the medial right base. This raises  the possibility of right base pneumonia.   Original Report Authenticated By: Thomasenia Sales, M.D.    Ct Head Wo Contrast  12/29/2011  *RADIOLOGY REPORT*  Clinical Data: Weakness  CT HEAD WITHOUT CONTRAST  Technique:  Contiguous axial images were obtained from the base of the skull through the vertex without contrast.  Comparison: 11/17/2010 and brain MRI 12/23/2011  Findings: No skull fracture is noted.  The paranasal sinuses and mastoid air cells are unremarkable.  Atherosclerotic calcifications of carotid siphon again noted.  No intracranial hemorrhage, mass effect or midline shift.  No acute infarction.  No mass lesion is noted on this unenhanced scan.  Stable mild cerebral atrophy.  IMPRESSION: No acute intracranial abnormality.  Mild cerebral atrophy again noted.   Original Report Authenticated By: Natasha Mead, M.D.    Mr Brain Wo Contrast  12/23/2011  *RADIOLOGY REPORT*  Clinical Data: Parkinsonism with worsening of symptoms.  MRI HEAD WITHOUT CONTRAST  Technique:  Multiplanar, multiecho pulse sequences of the brain and surrounding structures were obtained according to standard protocol without intravenous contrast.  Comparison: MRI 11/18/2010  Findings: Negative for acute infarct.  Mild age appropriate atrophy is present.  Mild chronic changes in the white matter bilaterally are stable and compatible with chronic microvascular ischemia. Negative for intracranial hemorrhage or fluid collection.  Small dural based soft tissue mass anterior to the pons on the right is unchanged.  No contrast was administered however this would be typical for a meningioma which is stable.  This measures 7 x 12 mm.  There is no mass effect on the pons.  No other mass lesion is identified.  Mild mucosal thickening right maxillary sinus. Right mastoid sinus effusion, unchanged.  IMPRESSION: Chronic microvascular ischemic changes in the white matter.  No acute infarct.  Small meningioma to the right of the pons is unchanged from  the prior study.  No significant mass effect on the pons is identified.   Original Report Authenticated By: Janeece Riggers, M.D.     Assessment/Plan Principal Problem:  *Chest pain, mid sternal - Admit to telemetry, serial cardiac enzymes. Cont ASA, imdur  - recent  2-D echocardiogram showed EF of 55-60% in 09/2011  Active Problems:   PARKINSON'S DISEASE - Patient had the MRI done last admission which did not show any acute infarct or intracranial process. - Patient's wife is extremely frustrated and feels that his tremors have actually gotten worse in the last week of starting selegiline. I will continue Sinemet however hold off on selegiline. - I have called neurology consult and we'll await further recommendations.   HYPERTENSION: Stable, continue cardiac medication   Atrial fibrillation: Rate controlled, not on anticoagulation with fall risk from Parkinson's   GERD: Place on PPI   HYPERLIPIDEMIA: Continue Pravachol   Other dysphagia - Patient's wife also reports choking episode, will have swallow evaluation and place patient on dysphagia 3 diet   Adult failure to thrive - Obtain PT, OT consults, speech consults, nutrition consult   Constipation - Restart all stool softeners  DVT prophylaxis: Lovenox  CODE STATUS: Full code  Further plan will depend as patient's clinical course evolves and further radiologic and laboratory data become available.   Time Spent on Admission: 1 hour  Trevyn Lumpkin M.D. [facility] 12/29/2011, 6:19 PM Pager: [phone]  If 7PM-7AM, please contact night-coverage [email or web address] Password [email or web address]

## 2011-12-29 NOTE — ED Notes (Signed)
Low Sodium Diet ordered spoke with Kenney Houseman

## 2011-12-29 NOTE — ED Notes (Signed)
No distress noted.  Pt is tolerating PO applesauce without difficulty.

## 2011-12-30 ENCOUNTER — Telehealth: Payer: Self-pay | Admitting: Family Medicine

## 2011-12-30 ENCOUNTER — Encounter (HOSPITAL_COMMUNITY): Payer: Self-pay | Admitting: General Practice

## 2011-12-30 DIAGNOSIS — R5381 Other malaise: Secondary | ICD-10-CM

## 2011-12-30 DIAGNOSIS — G20A1 Parkinson's disease without dyskinesia, without mention of fluctuations: Secondary | ICD-10-CM

## 2011-12-30 DIAGNOSIS — G20 Parkinson's disease: Secondary | ICD-10-CM

## 2011-12-30 DIAGNOSIS — I2510 Atherosclerotic heart disease of native coronary artery without angina pectoris: Secondary | ICD-10-CM

## 2011-12-30 DIAGNOSIS — G250 Essential tremor: Secondary | ICD-10-CM

## 2011-12-30 LAB — CK TOTAL AND CKMB (NOT AT ARMC)
CK, MB: 1.8 ng/mL (ref 0.3–4.0)
Relative Index: INVALID (ref 0.0–2.5)
Total CK: 33 U/L (ref 7–232)

## 2011-12-30 LAB — CBC
MCH: 23.7 pg — ABNORMAL LOW (ref 26.0–34.0)
MCHC: 30.3 g/dL (ref 30.0–36.0)
MCV: 78.4 fL (ref 78.0–100.0)
Platelets: 191 10*3/uL (ref 150–400)
RDW: 16.6 % — ABNORMAL HIGH (ref 11.5–15.5)
WBC: 4.8 10*3/uL (ref 4.0–10.5)

## 2011-12-30 LAB — BASIC METABOLIC PANEL
CO2: 22 mEq/L (ref 19–32)
Calcium: 8.7 mg/dL (ref 8.4–10.5)
Creatinine, Ser: 1.17 mg/dL (ref 0.50–1.35)

## 2011-12-30 MED ORDER — BISACODYL 10 MG RE SUPP: 10.0000 mg | Freq: Every day | RECTAL | Status: DC | PRN

## 2011-12-30 MED ORDER — PRIMIDONE 50 MG PO TABS
25.0000 mg | ORAL_TABLET | Freq: Two times a day (BID) | ORAL | Status: DC
  Administered 2011-12-30 – 2011-12-31 (×3): 25 mg via ORAL
  Filled 2011-12-30 (×5): qty 0.5

## 2011-12-30 NOTE — Consult Note (Signed)
Physical Medicine and Rehabilitation Consult Reason for Consult: Worsening tremor, inability to walk and chest pain Referring Physician:  Dr. Isidoro Donning   HPI: [name] is a 71 y.o. male history Parkinson's disease, mild dementia, coronary disease, hyperlipidemia, stroke and atrial fibrillation without anticoagulation who was recently admitted last week to the hospital for pneumonia--. Patient has been on Sinemet for Parkinson's disease 25-103 times per day. Selegiline was added during his last hospitalization. Wife and patient refused SNF. They report that he's been very weak and has had progressive problems with ambulation since discharge.  Selegiline was discontinued on admission 12/29/11. CT head done and negative for acute changes. ST evaluation done and no signs of aspiration noted. Cardiac enzymes checked and negative.  Neurology consulted and recommended trial of mysoline for tremors and CIR for intensive therapies.  PT/OT evaluations pending.  Patient states he was independent with a walker prior to his pneumonia  Review of Systems  HENT: Negative for hearing loss.   Eyes: Negative for blurred vision and double vision.  Respiratory: Negative for cough and shortness of breath.   Cardiovascular: Positive for chest pain (with anxiety, stress or increased activity.).  Neurological: Positive for tremors (worsening for the past month. ). Negative for headaches.  Psychiatric/Behavioral: The patient is nervous/anxious.    Past Medical History  Diagnosis Date  . PE (pulmonary embolism)     a. >20 years ago;  b. nl V:Q scan 10/2010  . DVT (deep venous thrombosis) 01/2007    bilateral  . Meningioma   . BPH (benign prostatic hyperplasia)   . Personal history of colonic polyps   . Venereal wart   . Depression   . Asthma   . Chickenpox   . Thrombophlebitis   . GERD (gastroesophageal reflux disease)   . Parkinson disease   . Dementia   . CAD (coronary artery disease)     a. 08/2009 s/p  CABG x 2 (LIMA->LAD, SVG-OM). b. Adm w/ CP 09/2011 - questionable functionality of LIMA graft but patient declined further catheterization (cath 10/05/11 -- ostial LAD disease, possible flap in prox L subclavian/unable to image LIMA well, atretic SVG-OM)  . Hyperlipidemia   . Stricture and stenosis of esophagus   . Diverticulosis of colon (without mention of hemorrhage)   . Mitral regurgitation     a. 08/2009 s/p MV repair 22mm Edwards ring and oversewing of LA appendage @ time of CABG;  b. cardiac MRI 09/2011 showing some disruption of lateral annuloplasty ring with "perivalvular" MR and some central MR   . Thrombus of left atrial appendage     a. Abnormal dye staining on cath 09/2011 - imaged by CT/MRI/TEE - felt to be surgically isolated from the LA  . Anxiety   . Stroke   . Atrial fibrillation     a. Paroxysmal, not Coumadin candidate 2/2 Parkinson's/gait instability  . Brain tumor     "right side of my brain" (12/22/2011)  . Arthritis     "very little" (12/22/2011)  . Pulmonary embolism 01/2007  . Aspiration pneumonia 03/2008  . Hiatal hernia     PARKINSONS TREMORS   Past Surgical History  Procedure Date  . Varicose vein surgery     left leg  . Mitral valve repair 08/2009  . Umbilical hernia repair   . Tee without cardioversion 10/06/2011    Procedure: TRANSESOPHAGEAL ECHOCARDIOGRAM (TEE);  Surgeon: Vesta Mixer, MD;  Location: [facility] ENDOSCOPY;  Service: Cardiovascular;  Laterality: N/A;  . Cardiac catheterization   .  Esophagogastroduodenoscopy 11/15/2011    Procedure: ESOPHAGOGASTRODUODENOSCOPY (EGD);  Surgeon: Meryl Dare, MD,FACG;  Location: [facility] ENDOSCOPY;  Service: Endoscopy;  Laterality: N/A;  . Tonsillectomy and adenoidectomy   . Mastoidectomy     "right; I'm really not sure what they did to my mastoid" (12/22/2011)  . Inguinal hernia repair     right  . Lumbar disc surgery     "Dr. Jeral Fruit; herniated disc & remove bone spurs"  . Back surgery   . Coronary artery bypass  graft 08/2009    CABG X2   Family History  Problem Relation Age of Onset  . Arthritis    . Diabetes    . Stroke Father   . Heart disease Mother    Social History:  reports that he quit smoking about 58 years ago. His smoking use included Cigarettes. He has a 10 pack-year smoking history. He has never used smokeless tobacco. He reports that he does not drink alcohol or use illicit drugs.   Allergies  Allergen Reactions  . Ibuprofen Other (See Comments)    Stomach hurts   Medications Prior to Admission  Medication Sig Dispense Refill  . amiodarone (PACERONE) 200 MG tablet Take 1 tablet (200 mg total) by mouth daily.      Marland Kitchen aspirin EC 325 MG tablet Take 325 mg by mouth daily.      . bisacodyl (DULCOLAX) 10 MG suppository Place 10 mg rectally as needed. For upset stomach      . carbidopa-levodopa (SINEMET) 25-100 MG per tablet Take 1 tablet by mouth 3 (three) times daily.       . clonazePAM (KLONOPIN) 1 MG tablet Take 1 tablet (1 mg total) by mouth every 6 (six) hours.  120 tablet  5  . hydrocortisone cream 1 % Apply 1 application topically 2 (two) times daily as needed. For skin irritation when sweating      . isosorbide mononitrate (IMDUR) 30 MG 24 hr tablet Take 15 mg by mouth daily.      Marland Kitchen lactulose, encephalopathy, (GENERLAC) 10 GM/15ML SOLN Take 10 g by mouth daily as needed. For soft stools per wife      . levothyroxine (SYNTHROID, LEVOTHROID) 75 MCG tablet Take 75 mcg by mouth daily.      . Multiple Vitamin (MULITIVITAMIN WITH MINERALS) TABS Take 1 tablet by mouth daily.      . nitroGLYCERIN (NITROSTAT) 0.4 MG SL tablet Place 0.4 mg under the tongue every 5 (five) minutes as needed. For chest pain      . pantoprazole (PROTONIX) 40 MG tablet Take 40 mg by mouth 2 (two) times daily.      . polyethylene glycol (MIRALAX / GLYCOLAX) packet Take 17 g by mouth 4 (four) times daily.       . pravastatin (PRAVACHOL) 40 MG tablet Take 40 mg by mouth at bedtime.       . risperiDONE  (RISPERDAL) 1 MG tablet Take 1 mg by mouth at bedtime.      . selegiline (ELDEPRYL) 5 MG tablet Take 1 tablet (5 mg total) by mouth daily before breakfast.  30 tablet  0  . Tamsulosin HCl (FLOMAX) 0.4 MG CAPS Take 0.4 mg by mouth every morning.       . temazepam (RESTORIL) 30 MG capsule Take 30 mg by mouth at bedtime as needed. For sleep        Home:    Functional History:   Functional Status:  Mobility:  ADL:    Cognition: Cognition Orientation Level: Oriented X4    Blood pressure 116/66, pulse 74, temperature 97.8 F (36.6 C), temperature source Oral, resp. rate 13, height 6\' 3"  (1.905 m), weight 102.468 kg (225 lb 14.4 oz), SpO2 100.00%. Physical Exam  Nursing note and vitals reviewed. HENT:  Head: Normocephalic and atraumatic.  Eyes: Pupils are equal, round, and reactive to light.  Cardiovascular: Normal rate.   Pulmonary/Chest: Effort normal and breath sounds normal.  Abdominal: Soft. Bowel sounds are normal.  Neurological: He is alert.       Masked facies. Resting tremor head and hands.  Cogwheelling with BLE.   Motor strength is 4+/5 in bilateral deltoid, biceps, triceps, grip, hip flexor, knee extensors, ankle dorsiflexor and plantar flexor Sensation is intact to light touch in the upper and lower limbs There is no ataxia on finger nose to finger testing.2 No evidence of cogwheel rigidity in the upper extremities Positive lower extremity edema Sensation is intact to light touch in the upper and lower limbs  Results for orders placed during the hospital encounter of 12/29/11 (from the past 24 hour(s))  TROPONIN I     Status: Normal   Collection Time   12/29/11  1:30 PM      Component Value Range   Troponin I <0.30  <0.30 ng/mL  CBC WITH DIFFERENTIAL     Status: Abnormal   Collection Time   12/29/11  1:32 PM      Component Value Range   WBC 7.3  4.0 - 10.5 K/uL   RBC 3.82 (*) 4.22 - 5.81 MIL/uL   Hemoglobin 9.3 (*) 13.0 - 17.0 g/dL   HCT 19.1 (*)  47.8 - 52.0 %   MCV 79.1  78.0 - 100.0 fL   MCH 24.3 (*) 26.0 - 34.0 pg   MCHC 30.8  30.0 - 36.0 g/dL   RDW 29.5 (*) 62.1 - 30.8 %   Platelets 168  150 - 400 K/uL   Neutrophils Relative 74  43 - 77 %   Neutro Abs 5.4  1.7 - 7.7 K/uL   Lymphocytes Relative 15  12 - 46 %   Lymphs Abs 1.1  0.7 - 4.0 K/uL   Monocytes Relative 9  3 - 12 %   Monocytes Absolute 0.7  0.1 - 1.0 K/uL   Eosinophils Relative 2  0 - 5 %   Eosinophils Absolute 0.2  0.0 - 0.7 K/uL   Basophils Relative 0  0 - 1 %   Basophils Absolute 0.0  0.0 - 0.1 K/uL  COMPREHENSIVE METABOLIC PANEL     Status: Abnormal   Collection Time   12/29/11  1:32 PM      Component Value Range   Sodium 140  135 - 145 mEq/L   Potassium 4.6  3.5 - 5.1 mEq/L   Chloride 106  96 - 112 mEq/L   CO2 24  19 - 32 mEq/L   Glucose, Bld 104 (*) 70 - 99 mg/dL   BUN 11  6 - 23 mg/dL   Creatinine, Ser 6.57  0.50 - 1.35 mg/dL   Calcium 8.9  8.4 - 84.6 mg/dL   Total Protein 6.4  6.0 - 8.3 g/dL   Albumin 3.4 (*) 3.5 - 5.2 g/dL   AST 13  0 - 37 U/L   ALT 5  0 - 53 U/L   Alkaline Phosphatase 64  39 - 117 U/L   Total Bilirubin 0.5  0.3 - 1.2 mg/dL   GFR  calc non Af Amer 61 (*) >90 mL/min   GFR calc Af Amer 71 (*) >90 mL/min  URINALYSIS, ROUTINE W REFLEX MICROSCOPIC     Status: Abnormal   Collection Time   12/29/11  2:14 PM      Component Value Range   Color, Urine AMBER (*) YELLOW   APPearance CLOUDY (*) CLEAR   Specific Gravity, Urine 1.024  1.005 - 1.030   pH 7.5  5.0 - 8.0   Glucose, UA NEGATIVE  NEGATIVE mg/dL   Hgb urine dipstick NEGATIVE  NEGATIVE   Bilirubin Urine NEGATIVE  NEGATIVE   Ketones, ur 15 (*) NEGATIVE mg/dL   Protein, ur 30 (*) NEGATIVE mg/dL   Urobilinogen, UA 0.2  0.0 - 1.0 mg/dL   Nitrite NEGATIVE  NEGATIVE   Leukocytes, UA SMALL (*) NEGATIVE  URINE MICROSCOPIC-ADD ON     Status: Abnormal   Collection Time   12/29/11  2:14 PM      Component Value Range   WBC, UA 0-2  <3 WBC/hpf   RBC / HPF 0-2  <3 RBC/hpf   Bacteria, UA  FEW (*) RARE   Casts GRANULAR CAST (*) NEGATIVE  TROPONIN I     Status: Normal   Collection Time   12/29/11  5:31 PM      Component Value Range   Troponin I <0.30  <0.30 ng/mL  CK TOTAL AND CKMB     Status: Normal   Collection Time   12/29/11  5:31 PM      Component Value Range   Total CK 38  7 - 232 U/L   CK, MB 1.7  0.3 - 4.0 ng/mL   Relative Index RELATIVE INDEX IS INVALID  0.0 - 2.5  CBC     Status: Abnormal   Collection Time   12/29/11  9:52 PM      Component Value Range   WBC 6.0  4.0 - 10.5 K/uL   RBC 3.82 (*) 4.22 - 5.81 MIL/uL   Hemoglobin 9.3 (*) 13.0 - 17.0 g/dL   HCT 11.9 (*) 14.7 - 82.9 %   MCV 78.3  78.0 - 100.0 fL   MCH 24.3 (*) 26.0 - 34.0 pg   MCHC 31.1  30.0 - 36.0 g/dL   RDW 56.2 (*) 13.0 - 86.5 %   Platelets 183  150 - 400 K/uL  CREATININE, SERUM     Status: Abnormal   Collection Time   12/29/11  9:52 PM      Component Value Range   Creatinine, Ser 1.15  0.50 - 1.35 mg/dL   GFR calc non Af Amer 63 (*) >90 mL/min   GFR calc Af Amer 73 (*) >90 mL/min  TSH     Status: Normal   Collection Time   12/29/11  9:52 PM      Component Value Range   TSH 2.960  0.350 - 4.500 uIU/mL  TROPONIN I     Status: Normal   Collection Time   12/29/11 11:52 PM      Component Value Range   Troponin I <0.30  <0.30 ng/mL  CK TOTAL AND CKMB     Status: Normal   Collection Time   12/29/11 11:52 PM      Component Value Range   Total CK 33  7 - 232 U/L   CK, MB 1.8  0.3 - 4.0 ng/mL   Relative Index RELATIVE INDEX IS INVALID  0.0 - 2.5  MRSA PCR SCREENING     Status:  Normal   Collection Time   12/30/11  4:31 AM      Component Value Range   MRSA by PCR NEGATIVE  NEGATIVE  TROPONIN I     Status: Normal   Collection Time   12/30/11  6:08 AM      Component Value Range   Troponin I <0.30  <0.30 ng/mL  CK TOTAL AND CKMB     Status: Normal   Collection Time   12/30/11  6:08 AM      Component Value Range   Total CK 31  7 - 232 U/L   CK, MB 1.8  0.3 - 4.0 ng/mL   Relative Index  RELATIVE INDEX IS INVALID  0.0 - 2.5  BASIC METABOLIC PANEL     Status: Abnormal   Collection Time   12/30/11  6:08 AM      Component Value Range   Sodium 141  135 - 145 mEq/L   Potassium 3.9  3.5 - 5.1 mEq/L   Chloride 108  96 - 112 mEq/L   CO2 22  19 - 32 mEq/L   Glucose, Bld 99  70 - 99 mg/dL   BUN 8  6 - 23 mg/dL   Creatinine, Ser 5.62  0.50 - 1.35 mg/dL   Calcium 8.7  8.4 - 13.0 mg/dL   GFR calc non Af Amer 61 (*) >90 mL/min   GFR calc Af Amer 71 (*) >90 mL/min  CBC     Status: Abnormal   Collection Time   12/30/11  6:08 AM      Component Value Range   WBC 4.8  4.0 - 10.5 K/uL   RBC 3.79 (*) 4.22 - 5.81 MIL/uL   Hemoglobin 9.0 (*) 13.0 - 17.0 g/dL   HCT 86.5 (*) 78.4 - 69.6 %   MCV 78.4  78.0 - 100.0 fL   MCH 23.7 (*) 26.0 - 34.0 pg   MCHC 30.3  30.0 - 36.0 g/dL   RDW 29.5 (*) 28.4 - 13.2 %   Platelets 191  150 - 400 K/uL   Dg Chest 2 View  12/29/2011  *RADIOLOGY REPORT*  Clinical Data: Chest pain  CHEST - 2 VIEW  Comparison: 12/23/2011  Findings: Previous median sternotomy and CABG procedure.  There is mild cardiac enlargement.  Scarring within the right midlung and right base is again noted.Blunting of the costophrenic angle is identified which appears similar to previous exam.  IMPRESSION:  1.  Pleural parenchymal scarring within the right midlung and right base. 2.  Chronic blunting of the right costophrenic angle which may represent scarring and/or pleural fluid. 3.  Cardiac enlargement.   Original Report Authenticated By: Signa Kell, M.D.    Ct Head Wo Contrast  12/29/2011  *RADIOLOGY REPORT*  Clinical Data: Weakness  CT HEAD WITHOUT CONTRAST  Technique:  Contiguous axial images were obtained from the base of the skull through the vertex without contrast.  Comparison: 11/17/2010 and brain MRI 12/23/2011  Findings: No skull fracture is noted.  The paranasal sinuses and mastoid air cells are unremarkable.  Atherosclerotic calcifications of carotid siphon again noted.  No  intracranial hemorrhage, mass effect or midline shift.  No acute infarction.  No mass lesion is noted on this unenhanced scan.  Stable mild cerebral atrophy.  IMPRESSION: No acute intracranial abnormality.  Mild cerebral atrophy again noted.   Original Report Authenticated By: Natasha Mead, M.D.     Assessment/Plan: Diagnosis: Deconditioning with Parkinson's disease 1. Does the need for close, 24 hr/day  medical supervision in concert with the patient's rehab needs make it unreasonable for this patient to be served in a less intensive setting? No 2. Co-Morbidities requiring supervision/potential complications: Dementia, pontine meningioma, chronic kidney disease, left atrial appendage thrombus, atrial fibrillation 3. Due to bladder management, bowel management and safety, does the patient require 24 hr/day rehab nursing? Potentially 4. Does the patient require coordinated care of a physician, rehab nurse, not applicable to address physical and functional deficits in the context of the above medical diagnosis(es)? No Addressing deficits in the following areas: not applicable 5. Can the patient actively participate in an intensive therapy program of at least 3 hrs of therapy per day at least 5 days per week? Yes 6. The potential for patient to make measurable gains while on inpatient rehab is not applicable 7. Anticipated functional outcomes upon discharge from inpatient rehab are Not applicable with PT, Not applicable with OT, Not applicable with SLP. 8. Estimated rehab length of stay to reach the above functional goals is: Not applicable 9. Does the patient have adequate social supports to accommodate these discharge functional goals? Potentially 10. Anticipated D/C setting: Home 11. Anticipated post D/C treatments: HH therapy 12. Overall Rehab/Functional Prognosis: good  RECOMMENDATIONS: This patient's condition is appropriate for continued rehabilitative care in the following setting: [facility] Patient  has agreed to participate in recommended program. Potentially Note that insurance prior authorization may be required for reimbursement for recommended care.  Comment:Home health PT and OT recommended. Patient is at supervision to minimum guard level. Please reconsult if status declines    12/30/2011

## 2011-12-30 NOTE — Telephone Encounter (Signed)
Pt needs alternate drug for prestique.  Pharm wants $95.00 for RX.  Pt is in [facility]/ Rm 3025.

## 2011-12-30 NOTE — Progress Notes (Signed)
Subjective: Patient out of bed sitting at table.  Mild tremor present.  Seen in UE's and head.  Does not report side effects to Mysoline  Objective: Current vital signs: BP 116/66  Pulse 74  Temp 97.8 F (36.6 C) (Oral)  Resp 13  Ht 6\' 3"  (1.905 m)  Wt 102.468 kg (225 lb 14.4 oz)  BMI 28.24 kg/m2  SpO2 100% Vital signs in last 24 hours: Temp:  [97.6 F (36.4 C)-97.8 F (36.6 C)] 97.8 F (36.6 C) (11/07 0500) Pulse Rate:  [71-86] 74  (11/07 0500) Resp:  [13-17] 13  (11/06 1915) BP: (99-140)/(60-78) 116/66 mmHg (11/07 0500) SpO2:  [97 %-100 %] 100 % (11/07 0500) Weight:  [102.468 kg (225 lb 14.4 oz)] 102.468 kg (225 lb 14.4 oz) (11/06 2030)  Intake/Output from previous day:   Intake/Output this shift:   Nutritional status: Dysphagia  Neurologic Exam: Mental Status:  Alert, oriented, thought content appropriate. Speech fluent without evidence of aphasia. Able to follow commands without difficulty.  Cranial Nerves:  II-Visual fields were normal.  III/IV/VI-Pupils were equal and reacted. Extraocular movements were full and conjugate.  V/VII-no facial numbness and no facial weakness.  VIII-normal.  X-normal speech and symmetrical palatal movement.  Motor: Mild low amplitude, high frequency tremor noted of the bilateral upper extremities.  Moderately severe intention tremor of the head and to a lesser extent upper extremities.  Sensory: Normal throughout.  Deep Tendon Reflexes: 1+ in the upper extremities and absent in the lower extremities and symmetric.  Plantars: Mute bilaterally  Cerebellar: finger-to-nose testing slightly impaired with intention tremor bilaterally   Lab Results: Basic Metabolic Panel:  Lab 12/30/11 1610 12/29/11 2152 12/29/11 1332 12/24/11 0543  NA 141 -- 140 139  K 3.9 -- 4.6 3.5  CL 108 -- 106 102  CO2 22 -- 24 27  GLUCOSE 99 -- 104* 93  BUN 8 -- 11 13  CREATININE 1.17 1.15 1.17 1.49*  CALCIUM 8.7 -- 8.9 8.8  MG -- -- -- --  PHOS -- -- --  --    Liver Function Tests:  Lab 12/29/11 1332 12/24/11 0543  AST 13 11  ALT 5 <5  ALKPHOS 64 60  BILITOT 0.5 0.5  PROT 6.4 6.4  ALBUMIN 3.4* 3.5   No results found for this basename: LIPASE:5,AMYLASE:5 in the last 168 hours No results found for this basename: AMMONIA:3 in the last 168 hours  CBC:  Lab 12/30/11 0608 12/29/11 2152 12/29/11 1332  WBC 4.8 6.0 7.3  NEUTROABS -- -- 5.4  HGB 9.0* 9.3* 9.3*  HCT 29.7* 29.9* 30.2*  MCV 78.4 78.3 79.1  PLT 191 183 168    Cardiac Enzymes:  Lab 12/30/11 0608 12/29/11 2352 12/29/11 1731 12/29/11 1330  CKTOTAL 31 33 38 --  CKMB 1.8 1.8 1.7 --  CKMBINDEX -- -- -- --  TROPONINI <0.30 <0.30 <0.30 <0.30    Lipid Panel: No results found for this basename: CHOL:5,TRIG:5,HDL:5,CHOLHDL:5,VLDL:5,LDLCALC:5 in the last 168 hours  CBG: No results found for this basename: GLUCAP:5 in the last 168 hours  Microbiology: Results for orders placed during the hospital encounter of 12/29/11  MRSA PCR SCREENING     Status: Normal   Collection Time   12/30/11  4:31 AM      Component Value Range Status Comment   MRSA by PCR NEGATIVE  NEGATIVE Final     Coagulation Studies: No results found for this basename: LABPROT:5,INR:5 in the last 72 hours  Imaging: Dg Chest 2 View  12/29/2011  *  RADIOLOGY REPORT*  Clinical Data: Chest pain  CHEST - 2 VIEW  Comparison: 12/23/2011  Findings: Previous median sternotomy and CABG procedure.  There is mild cardiac enlargement.  Scarring within the right midlung and right base is again noted.Blunting of the costophrenic angle is identified which appears similar to previous exam.  IMPRESSION:  1.  Pleural parenchymal scarring within the right midlung and right base. 2.  Chronic blunting of the right costophrenic angle which may represent scarring and/or pleural fluid. 3.  Cardiac enlargement.   Original Report Authenticated By: Signa Kell, M.D.    Ct Head Wo Contrast  12/29/2011  *RADIOLOGY REPORT*  Clinical  Data: Weakness  CT HEAD WITHOUT CONTRAST  Technique:  Contiguous axial images were obtained from the base of the skull through the vertex without contrast.  Comparison: 11/17/2010 and brain MRI 12/23/2011  Findings: No skull fracture is noted.  The paranasal sinuses and mastoid air cells are unremarkable.  Atherosclerotic calcifications of carotid siphon again noted.  No intracranial hemorrhage, mass effect or midline shift.  No acute infarction.  No mass lesion is noted on this unenhanced scan.  Stable mild cerebral atrophy.  IMPRESSION: No acute intracranial abnormality.  Mild cerebral atrophy again noted.   Original Report Authenticated By: Natasha Mead, M.D.     Medications:  I have reviewed the patient's current medications. Scheduled:   . amiodarone  200 mg Oral Daily  . aspirin EC  325 mg Oral Daily  . carbidopa-levodopa  1 tablet Oral TID  . clonazePAM  1 mg Oral Q6H  . enoxaparin (LOVENOX) injection  40 mg Subcutaneous Q24H  . isosorbide mononitrate  15 mg Oral Daily  . levothyroxine  75 mcg Oral QAC breakfast  . multivitamin with minerals  1 tablet Oral Daily  . pantoprazole  40 mg Oral BID  . polyethylene glycol  17 g Oral QID  . primidone  25 mg Oral BID  . risperiDONE  1 mg Oral QHS  . simvastatin  20 mg Oral q1800  . sodium chloride  3 mL Intravenous Q12H  . Tamsulosin HCl  0.4 mg Oral q morning - 10a  . [DISCONTINUED] bisacodyl  10 mg Rectal Daily  . [DISCONTINUED] isosorbide mononitrate  15 mg Oral Daily  . [DISCONTINUED] pantoprazole  40 mg Oral Q0600    Assessment/Plan:  Patient Active Hospital Problem List: Benign essential tremor (12/29/2011)   Assessment: On Mysoline. Tolerating well.     Plan: Continue Mysoline at current dose.  Can increase slowly as an outpatient as tolerated for control of tremor.       LOS: 1 day   Thana Farr, MD [facility] [phone] 12/30/2011  1:18 PM

## 2011-12-30 NOTE — ED Provider Notes (Signed)
Medical screening examination/treatment/procedure(s) were performed by non-physician practitioner and as supervising physician I was immediately available for consultation/collaboration.  Chandlor Noecker R. Izaia Say, MD 12/30/11 0703 

## 2011-12-30 NOTE — Telephone Encounter (Signed)
Lets wait to discuss this after he gets out of the hospital. They may change all his meds around

## 2011-12-30 NOTE — Evaluation (Signed)
Clinical/Bedside Swallow Evaluation Patient Details  Name: [name] MRN: [id number] Date of Birth: 07/02/1940  Today's Date: 12/30/2011 Time: 0810-0824 SLP Time Calculation (min): 14 min  Past Medical History:  Past Medical History  Diagnosis Date  . PE (pulmonary embolism)     a. >20 years ago;  b. nl V:Q scan 10/2010  . DVT (deep venous thrombosis) 01/2007    bilateral  . Meningioma   . BPH (benign prostatic hyperplasia)   . Personal history of colonic polyps   . Venereal wart   . Depression   . Asthma   . Chickenpox   . Thrombophlebitis   . GERD (gastroesophageal reflux disease)   . Parkinson disease   . Dementia   . CAD (coronary artery disease)     a. 08/2009 s/p CABG x 2 (LIMA->LAD, SVG-OM). b. Adm w/ CP 09/2011 - questionable functionality of LIMA graft but patient declined further catheterization (cath 10/05/11 -- ostial LAD disease, possible flap in prox L subclavian/unable to image LIMA well, atretic SVG-OM)  . Hyperlipidemia   . Stricture and stenosis of esophagus   . Hiatal hernia   . Diverticulosis of colon (without mention of hemorrhage)   . Mitral regurgitation     a. 08/2009 s/p MV repair 22mm Edwards ring and oversewing of LA appendage @ time of CABG;  b. cardiac MRI 09/2011 showing some disruption of lateral annuloplasty ring with "perivalvular" MR and some central MR   . Thrombus of left atrial appendage     a. Abnormal dye staining on cath 09/2011 - imaged by CT/MRI/TEE - felt to be surgically isolated from the LA  . Anxiety   . Stroke   . Atrial fibrillation     a. Paroxysmal, not Coumadin candidate 2/2 Parkinson's/gait instability  . Brain tumor     "right side of my brain" (12/22/2011)  . Arthritis     "very little" (12/22/2011)  . Pulmonary embolism 01/2007  . Aspiration pneumonia 03/2008   Past Surgical History:  Past Surgical History  Procedure Date  . Varicose vein surgery     left leg  . Mitral valve repair 08/2009  . Umbilical hernia  repair   . Tee without cardioversion 10/06/2011    Procedure: TRANSESOPHAGEAL ECHOCARDIOGRAM (TEE);  Surgeon: Vesta Mixer, MD;  Location: [facility] ENDOSCOPY;  Service: Cardiovascular;  Laterality: N/A;  . Cardiac catheterization   . Esophagogastroduodenoscopy 11/15/2011    Procedure: ESOPHAGOGASTRODUODENOSCOPY (EGD);  Surgeon: Meryl Dare, MD,FACG;  Location: [facility] ENDOSCOPY;  Service: Endoscopy;  Laterality: N/A;  . Tonsillectomy and adenoidectomy   . Mastoidectomy     "right; I'm really not sure what they did to my mastoid" (12/22/2011)  . Inguinal hernia repair     right  . Lumbar disc surgery     "Dr. Jeral Fruit; herniated disc & remove bone spurs"  . Back surgery   . Coronary artery bypass graft 08/2009    CABG X2   HPI:  Patient is a 71 year old male with history of Parkinson's disease, coronary disease, hypertension, BPH, GERD, dementia was recently discharged from the hospital on 12/24/2011 after being admitted for pneumonia. History was obtained from the patient and his wife present in the room. Patient's wife was was frustrated that he is more weak and unable to care for him. Per his wife, his tremors has been getting worse, patient was started on Selegiline on Monday along with Sinemet with no improvement in his tremors. Per his wife is not able to feed himself and  is at risk at falls. MRI brain was done during the previous admission which showed no acute infarct. Patient also had one episode of chest pain midsternal this morning around 8:45 am which was relieved by 2 nitroglycerin. At the time of my encounter patient had no chest pain. Pts wife reports choking episode.    Assessment / Plan / Recommendation Clinical Impression  Pt known to this SLp form admission and testing last year. Pt with Parkinsons that results in a mild oral dysphagia worsened by fatigue. Pt does not exhibit any overt sings of aspriation with straw sips (preferred and recommended method of intake) and demosntrates  abiilty to masticate soft solids without difficutly. Pt does report avoidance of meats, breads and difficult textures though coughing epsiodes do sometimes occur at home with solids. Disucussed aspiration precautions with pt, will return and discuss more fully with spouse. Pt and wife have previously documented that they do not every want any form of alternte nutrition. At this time pt is still functionally consuming soft solids and thin lqiudis without high aspiration risk. Recommend a Dys 2 diet with thin liquids.     Aspiration Risk  Mild    Diet Recommendation Dysphagia 2 (Fine chop);Thin liquid   Liquid Administration via: Straw Medication Administration: Whole meds with puree Supervision: Patient able to self feed;Intermittent supervision to cue for compensatory strategies Compensations: Slow rate;Small sips/bites (avoid mixed consistencies. No meat, no bread) Postural Changes and/or Swallow Maneuvers: Seated upright 90 degrees;Upright 30-60 min after meal    Other  Recommendations Oral Care Recommendations: Oral care BID   Follow Up Recommendations  Inpatient Rehab    Frequency and Duration min 2x/week  2 weeks   Pertinent Vitals/Pain NA    SLP Swallow Goals Patient will consume recommended diet without observed clinical signs of aspiration with: Supervision/safety Patient will utilize recommended strategies during swallow to increase swallowing safety with: Minimal cueing   Swallow Study Prior Functional Status       General HPI: Patient is a 71 year old male with history of Parkinson's disease, coronary disease, hypertension, BPH, GERD, dementia was recently discharged from the hospital on 12/24/2011 after being admitted for pneumonia. History was obtained from the patient and his wife present in the room. Patient's wife was was frustrated that he is more weak and unable to care for him. Per his wife, his tremors has been getting worse, patient was started on Selegiline on  Monday along with Sinemet with no improvement in his tremors. Per his wife is not able to feed himself and is at risk at falls. MRI brain was done during the previous admission which showed no acute infarct. Patient also had one episode of chest pain midsternal this morning around 8:45 am which was relieved by 2 nitroglycerin. At the time of my encounter patient had no chest pain. Pts wife reports choking episode.  Type of Study: Bedside swallow evaluation Previous Swallow Assessment: MBS in 9/12 - mild oral dysphagia secondary to Parkinson's Diet Prior to this Study: Dysphagia 3 (soft);Thin liquids Temperature Spikes Noted: No Respiratory Status: Room air Behavior/Cognition: Alert;Cooperative;Pleasant mood Oral Cavity - Dentition: Adequate natural dentition Self-Feeding Abilities: Able to feed self Patient Positioning: Upright in bed Baseline Vocal Quality: Clear Volitional Cough: Strong Volitional Swallow: Able to elicit    Oral/Motor/Sensory Function Overall Oral Motor/Sensory Function: Impaired Labial ROM: Within Functional Limits Labial Symmetry: Within Functional Limits Labial Strength: Within Functional Limits Labial Sensation: Reduced Lingual ROM: Within Functional Limits Lingual Symmetry: Within Functional Limits Lingual Strength:  Reduced (Fasciculations) Lingual Sensation: Within Functional Limits Facial ROM: Other (Comment) (adequated ROM, Masked) Facial Symmetry: Within Functional Limits Facial Strength: Within Functional Limits Facial Sensation: Within Functional Limits Velum: Within Functional Limits Mandible: Within Functional Limits   Ice Chips Ice chips: Not tested   Thin Liquid Thin Liquid: Within functional limits Presentation: Straw;Self Fed    Nectar Thick Nectar Thick Liquid: Not tested   Honey Thick Honey Thick Liquid: Not tested   Puree Puree: Within functional limits   Solid   GO    Solid: Within functional limits      [facility], MA CCC-SLP  [phone]  Claudine Mouton 12/30/2011,8:56 AM

## 2011-12-30 NOTE — Care Management Note (Addendum)
    Page 1 of 2   12/31/2011     11:26:04 AM   CARE MANAGEMENT NOTE 12/31/2011  Patient:  RUSH, SALCE   Account Number:  [id number]  Date Initiated:  12/30/2011  Documentation initiated by:  GRAVES-BIGELOW,Estefany Goebel  Subjective/Objective Assessment:   Pt admitted with weakness, worsening tremor, inability to walk and chest pain. Pt is from home with wife and has active [facility] services with [place]- RN,PT/OT, ST. Has dme WC, Cane, RW, 3n1 and [place].     Action/Plan:   CM spoke tp pt and offerred her PCS list and pt refusing at time due to cost expense. Pt states she is burned out at times and has a daughter and one son, however they can only help a limited amt of time. Pt iis refusing SNF at this time.   Anticipated DC Date:  12/31/2011   Anticipated DC Plan:  HOME W HOME HEALTH SERVICES      DC Planning Services  CM consult      Choice offered to / List presented to:  C-3 Spouse        HH arranged  HH-1 RN  HH-10 DISEASE MANAGEMENT  HH-2 PT  HH-3 OT  HH-5 SPEECH THERAPY  HH-4 NURSE'S AIDE  HH-6 SOCIAL WORKER      HH agency  [facility]   Status of service:  Completed, signed off Medicare Important Message given?   (If response is "NO", the following Medicare IM given date fields will be blank) Date Medicare IM given:   Date Additional Medicare IM given:    Discharge Disposition:  HOME W HOME HEALTH SERVICES  Per UR Regulation:  Reviewed for med. necessity/level of care/duration of stay  If discussed at Long Length of Stay Meetings, dates discussed:    Comments:  12-31-11 [address] [place], [place] [phone] CM did speak to pt and wife this am and plan is for home with [facility] services. CM spoke to MD and she is to resume above orders for services to start in am. CM did make referral for services. CM did call [facility] to speak to them about bed that pt had set up before admission. Bed was taken down and wife had quesiotns about set up cost. Per [facility] pt would  have a fee of 75.00. PT to wrk with pt before d/c. Wife also had quesiotns about [employer] [phone] pt to call and set up transportation and use ext [phone]. No further needs from CM at this time.   12-30-11 [address], [place] [phone] CM discussed case with CSW Aundra Millet and we both visited family and bothe pt and wife are still refusing SNF. Per wife they only want hh services or CIR. PT/OT are in the room and will make recommendations for disposition needs. CM will continue to monitor for needs.

## 2011-12-30 NOTE — Evaluation (Signed)
Physical Therapy Evaluation Patient Details Name: [name] MRN: [id number] DOB: 12/31/1940 Today's Date: 12/30/2011 Time: 7846-9629 PT Time Calculation (min): 31 min  PT Assessment / Plan / Recommendation Clinical Impression    Pt with recent admit for progressive weakness and ?PNA. Pt admitted yesterday with increased weakness and tremors per wife, also with episode of chest pain this am. Pt limited by tremors mainly with fine motor tasks during eval, but states his tremors worsen in the eveneing. Pt will benefit from  PT to  maximize function.    PT Assessment  Patient needs continued PT services    Follow Up Recommendations  Home health PT;Supervision for mobility/OOB    Does the patient have the potential to tolerate intense rehabilitation      Barriers to Discharge        Equipment Recommendations  None recommended by PT    Recommendations for Other Services     Frequency Min 3X/week    Precautions / Restrictions Precautions Precautions: Fall Restrictions Weight Bearing Restrictions: No   Pertinent Vitals/Pain       Mobility  Bed Mobility Supine to Sit: 5: Supervision;HOB flat;Other (comment) (no need for the rail) Sitting - Scoot to Edge of Bed: 5: Supervision Details for Bed Mobility Assistance: Actually moved to EOB smoothly and without freeze ups Transfers Transfers: Sit to Stand;Stand to Sit Sit to Stand: 4: Min guard;From bed;From chair/3-in-1;With armrests Stand to Sit: 4: Min guard;With armrests;To chair/3-in-1 Details for Transfer Assistance: min guard for safety; vc for hand placement and to square up with chair prior to sitting Ambulation/Gait Ambulation/Gait Assistance: 4: Min guard Ambulation Distance (Feet): 110 Feet Assistive device: Rolling walker Ambulation/Gait Assistance Details: used RW safely and appropriately; vc's for postural corrections Gait Pattern: Step-through pattern;Decreased step length - right;Decreased step length -  left;Decreased stride length Gait velocity: able to change gait speed with VC's    Shoulder Instructions     Exercises     PT Diagnosis: Difficulty walking  PT Problem List: Decreased activity tolerance;Decreased mobility;Decreased knowledge of use of DME;Decreased safety awareness;Decreased knowledge of precautions;Pain PT Treatment Interventions: DME instruction;Gait training;Functional mobility training;Therapeutic activities;Patient/family education   PT Goals Acute Rehab PT Goals PT Goal Formulation: With patient/family Time For Goal Achievement: 12/30/11 Potential to Achieve Goals: Fair Pt will go Supine/Side to Sit: with modified independence PT Goal: Supine/Side to Sit - Progress: Goal set today Pt will go Sit to Supine/Side: with modified independence Pt will go Sit to Stand: with supervision PT Goal: Sit to Stand - Progress: Goal set today Pt will go Stand to Sit: with supervision PT Goal: Stand to Sit - Progress: Goal set today Pt will Transfer Bed to Chair/Chair to Bed: with supervision PT Transfer Goal: Bed to Chair/Chair to Bed - Progress: Goal set today Pt will Ambulate: >150 feet;with supervision;with least restrictive assistive device PT Goal: Ambulate - Progress: Goal set today  Visit Information  Last PT Received On: 12/30/11 Assistance Needed: +1 PT/OT Co-Evaluation/Treatment: Yes    Subjective Data  Subjective: Oh I definitely have parkinsons! Patient Stated Goal: no goal stated   Prior Functioning  Home Living Lives With: Spouse Available Help at Discharge: Family;Available 24 hours/day Type of Home: House Home Access: Ramped entrance Home Layout: One level Bathroom Shower/Tub: Tub/shower unit;Curtain Firefighter: Standard Bathroom Accessibility: Yes How Accessible: Accessible via walker Home Adaptive Equipment: Grab bars in shower;Shower chair with back;Walker - rolling;Straight cane;Wheelchair - manual;Bedside commode/3-in-1;Hospital  bed Prior Function Level of Independence: Needs assistance Needs  Assistance: Bathing;Dressing;Feeding;Toileting;Meal Prep;Light Housekeeping;Gait Bath: Moderate Dressing: Moderate Feeding: Maximal Toileting: Moderate Meal Prep: Total Light Housekeeping: Total Gait Assistance: wife guards while walking, varies how much assistance Able to Take Stairs?: No Driving: No Vocation: Retired Musician: No difficulties Dominant Hand: Right    Cognition  Overall Cognitive Status: Appears within functional limits for tasks assessed/performed Arousal/Alertness: Awake/alert Orientation Level: Appears intact for tasks assessed Behavior During Session: [facility] for tasks performed Cognition - Other Comments: Wife reports his memory has been good but he takes longer to respond to questions asked..    Extremity/Trunk Assessment Right Upper Extremity Assessment RUE ROM/Strength/Tone: WFL for tasks assessed RUE Sensation: WFL - Light Touch RUE Coordination: Deficits RUE Coordination Deficits: tremors impeding gown tying and tooth brushing Left Upper Extremity Assessment LUE ROM/Strength/Tone: WFL for tasks assessed LUE Sensation: WFL - Light Touch LUE Coordination: Deficits LUE Coordination Deficits: tremors impeding gown tying and tooth brushing Right Lower Extremity Assessment RLE ROM/Strength/Tone: Within functional levels RLE Sensation: WFL - Light Touch Left Lower Extremity Assessment LLE ROM/Strength/Tone: Within functional levels LLE Sensation: WFL - Light Touch Trunk Assessment Trunk Assessment: Normal   Balance Balance Balance Assessed: Yes Static Sitting Balance Static Sitting - Balance Support: Feet supported;No upper extremity supported Static Sitting - Level of Assistance: 5: Stand by assistance Static Standing Balance Static Standing - Balance Support: No upper extremity supported Static Standing - Level of Assistance: 5: Stand by assistance;Other (comment)  (with Assistive device)  End of Session PT - End of Session Activity Tolerance: Patient tolerated treatment well Patient left: in bed;with call bell/phone within reach;with family/visitor present Nurse Communication: Mobility status  GP Functional Assessment Tool Used: clinical judgement Functional Limitation: Mobility: Walking and moving around Mobility: Walking and Moving Around Current Status (W0981): At least 1 percent but less than 20 percent impaired, limited or restricted Mobility: Walking and Moving Around Goal Status [phone]): At least 1 percent but less than 20 percent impaired, limited or restricted Mobility: Walking and Moving Around Discharge Status [id number]): At least 1 percent but less than 20 percent impaired, limited or restricted   Bosten Newstrom, Eliseo Gum 12/30/2011, 2:45 PM  12/30/2011  [place] Bing, PT [phone] (pager)

## 2011-12-30 NOTE — Evaluation (Signed)
Occupational Therapy Evaluation Patient Details Name: [name] MRN: [id number] DOB: [birth year] Today's Date: 12/30/2011 Time: 4098-1191 OT Time Calculation (min): 31 min  OT Assessment / Plan / Recommendation Clinical Impression  Pt with recent admit for progressive weakness and ?PNA. Pt admitted yesterday with increased weakness and tremors per wife, also with episode of chest pain this am. Pt limited by tremors mainly with fine motor tasks during eval, but states his tremors worsen in the eveneing. Pt will benefit from skilled OT in the acute setting to maximize I with ADL and ADL mobility prior to d/c.     OT Assessment  Patient needs continued OT Services    Follow Up Recommendations  Home health OT    Barriers to Discharge      Equipment Recommendations  None recommended by OT;None recommended by PT    Recommendations for Other Services    Frequency  Min 2X/week    Precautions / Restrictions Precautions Precautions: Fall Restrictions Weight Bearing Restrictions: No   Pertinent Vitals/Pain Pt denies any pain at this time    ADL  Eating/Feeding: Minimal assistance Where Assessed - Eating/Feeding: Chair Grooming: Performed;Minimal assistance Where Assessed - Grooming: Supported sitting Lower Body Dressing: Minimal assistance Where Assessed - Lower Body Dressing: Unsupported sit to stand Toilet Transfer: Min Pension scheme manager Method: Sit to Barista:  (from chair with armrests) Toileting - Clothing Manipulation and Hygiene: Minimal assistance Where Assessed - Glass blower/designer Manipulation and Hygiene: Standing Equipment Used: Rolling walker;Gait belt Transfers/Ambulation Related to ADLs: Min guard A with RW ambulation in hallway and room, including tight spaces ADL Comments: Pt doing very well this am, but states his Parkinson's is worse in the afternoon. Pt and his wife would prefer pt d/c to CIR and are adamantly refusing SNF. Hand  tremors noted during fine motor tasks, especially brushing teeth    OT Diagnosis: Generalized weakness  OT Problem List: Decreased activity tolerance;Impaired balance (sitting and/or standing);Decreased coordination;Decreased knowledge of use of DME or AE (tremors) OT Treatment Interventions: Self-care/ADL training;DME and/or AE instruction;Therapeutic activities;Patient/family education;Balance training   OT Goals Acute Rehab OT Goals OT Goal Formulation: With patient/family Time For Goal Achievement: 01/06/12 Potential to Achieve Goals: Good ADL Goals Pt Will Perform Grooming: with supervision;Standing at sink ADL Goal: Grooming - Progress: Goal set today Pt Will Perform Lower Body Bathing: with supervision;Sit to stand from bed ADL Goal: Lower Body Bathing - Progress: Goal set today Pt Will Perform Lower Body Dressing: with supervision;Sit to stand from bed ADL Goal: Lower Body Dressing - Progress: Goal set today Pt Will Transfer to Toilet: with supervision;with DME;3-in-1 ADL Goal: Toilet Transfer - Progress: Goal set today Pt Will Perform Toileting - Clothing Manipulation: with supervision;Sitting on 3-in-1 or toilet;Standing ADL Goal: Toileting - Clothing Manipulation - Progress: Goal set today Pt Will Perform Toileting - Hygiene: with supervision;Sit to stand from 3-in-1/toilet ADL Goal: Toileting - Hygiene - Progress: Goal set today Pt Will Perform Tub/Shower Transfer: Tub transfer;with supervision;Grab bars;Shower seat with back;Ambulation ADL Goal: Tub/Shower Transfer - Progress: Goal set today  Visit Information  Last OT Received On: 12/30/11 Assistance Needed: +1    Subjective Data  Subjective: I have my good days and my bad days Patient Stated Goal: Return home with wife. Adamantly refuses SNF   Prior Functioning     Home Living Lives With: Spouse Available Help at Discharge: Family;Available 24 hours/day Type of Home: House Home Access: Ramped entrance Home  Layout: One level Bathroom Shower/Tub:  Tub/shower unit;Curtain Teacher, early years/pre: Yes How Accessible: Accessible via walker Home Adaptive Equipment: Grab bars in shower;Shower chair with back;Walker - rolling;Straight cane;Wheelchair - manual;Bedside commode/3-in-1;Hospital bed Prior Function Level of Independence: Needs assistance Needs Assistance: Bathing;Dressing;Feeding;Toileting;Meal Prep;Light Housekeeping;Gait Bath: Moderate Dressing: Moderate Feeding: Maximal Toileting: Moderate Meal Prep: Total Light Housekeeping: Total Gait Assistance: wife guards while walking, varies how much assistance Able to Take Stairs?: No Driving: No Vocation: Retired Musician: No difficulties Dominant Hand: Right         Vision/Perception     Cognition  Overall Cognitive Status: Appears within functional limits for tasks assessed/performed Arousal/Alertness: Awake/alert Orientation Level: Appears intact for tasks assessed Behavior During Session: [facility] for tasks performed    Extremity/Trunk Assessment Right Upper Extremity Assessment RUE ROM/Strength/Tone: WFL for tasks assessed RUE Sensation: WFL - Light Touch RUE Coordination: Deficits RUE Coordination Deficits: tremors impeding gown tying and tooth brushing Left Upper Extremity Assessment LUE ROM/Strength/Tone: WFL for tasks assessed LUE Sensation: WFL - Light Touch LUE Coordination: Deficits LUE Coordination Deficits: tremors impeding gown tying and tooth brushing     Mobility Bed Mobility Supine to Sit: 5: Supervision;HOB flat Sitting - Scoot to Edge of Bed: 5: Supervision Transfers Sit to Stand: 4: Min guard;From bed;From chair/3-in-1;With armrests Stand to Sit: 4: Min guard;With armrests;To chair/3-in-1 Details for Transfer Assistance: min guard for safety; vc for hand placement and to square up with chair prior to sitting     Shoulder Instructions     Exercise       Balance     End of Session OT - End of Session Equipment Utilized During Treatment: Gait belt Activity Tolerance: Patient tolerated treatment well Patient left: in chair;with call bell/phone within reach;with family/visitor present Nurse Communication: Mobility status  GO Functional Assessment Tool Used: clinical judgment Functional Limitation: Self care Self Care Current Status (V4098): At least 20 percent but less than 40 percent impaired, limited or restricted Self Care Goal Status (J1914): At least 1 percent but less than 20 percent impaired, limited or restricted   Darshan Solanki 12/30/2011, 1:36 PM

## 2011-12-30 NOTE — Progress Notes (Signed)
Patient ID: [name]  male  DGL:[id number]    DOB: 08/17/1940    DOA: 12/29/2011  PCP: Nelwyn Salisbury, MD  Assessment/Plan:  Principal Problem:  *Chest pain, mid sternal; resolved  - 3 sets of cardiac enzymes are negative. Cont ASA, imdur  - recent 2-D echocardiogram showed EF of 55-60% in 09/2011, no need to repeat   Active Problems:  PARKINSON'S DISEASE  - Patient had the MRI done last admission which did not show any acute infarct or intracranial process.  - Appreciate Neuro rec's, cont sinemet, started mysoline   HYPERTENSION: Stable, continue cardiac medication   Atrial fibrillation: Rate controlled, not on anticoagulation with fall risk from Parkinson's  GERD: Place on PPI  HYPERLIPIDEMIA: Continue Pravachol   Other dysphagia  - swallow evaluation done today, rec'd dysphagia 2 diet   Adult failure to thrive  - Obtain PT, OT consults, nutrition consult  - discussed with patient and his wife, adamantly refuses SNF although she is extremely frustrated and having difficulty caring for him at home.     Constipation: resolved  DVT Prophylaxis:  Code Status:  Disposition: hopefully tomorrow   Subjective: No special complaints except feeling generalized weakness  Objective: Weight change:  No intake or output data in the 24 hours ending 12/30/11 1121 Blood pressure 116/66, pulse 74, temperature 97.8 F (36.6 C), temperature source Oral, resp. rate 13, height 6\' 3"  (1.905 m), weight 102.468 kg (225 lb 14.4 oz), SpO2 100.00%.  Physical Exam: General: Alert and awake, oriented x3, not in any acute distress. HEENT: anicteric sclera, pupils reactive to light and accommodation, EOMI CVS: S1-S2 clear, no murmur rubs or gallops Chest: clear to auscultation bilaterally, no wheezing, rales or rhonchi Abdomen: soft nontender, nondistended, normal bowel sounds, no organomegaly Extremities: no cyanosis, clubbing or edema noted bilaterally, resting tremor   Lab  Results: Basic Metabolic Panel:  Lab 12/30/11 5188 12/29/11 2152 12/29/11 1332  NA 141 -- 140  K 3.9 -- 4.6  CL 108 -- 106  CO2 22 -- 24  GLUCOSE 99 -- 104*  BUN 8 -- 11  CREATININE 1.17 1.15 --  CALCIUM 8.7 -- 8.9  MG -- -- --  PHOS -- -- --   Liver Function Tests:  Lab 12/29/11 1332 12/24/11 0543  AST 13 11  ALT 5 <5  ALKPHOS 64 60  BILITOT 0.5 0.5  PROT 6.4 6.4  ALBUMIN 3.4* 3.5     Lab 12/30/11 0608 12/29/11 2152 12/29/11 1332  WBC 4.8 6.0 --  NEUTROABS -- -- 5.4  HGB 9.0* 9.3* --  HCT 29.7* 29.9* --  MCV 78.4 78.3 --  PLT 191 183 --   Cardiac Enzymes:  Lab 12/30/11 0608 12/29/11 2352 12/29/11 1731  CKTOTAL 31 33 38  CKMB 1.8 1.8 1.7  CKMBINDEX -- -- --  TROPONINI <0.30 <0.30 <0.30    Micro Results: Recent Results (from the past 240 hour(s))  URINE CULTURE     Status: Normal   Collection Time   12/22/11  5:28 PM      Component Value Range Status Comment   Specimen Description URINE, RANDOM   Final    Special Requests NONE   Final    Culture  Setup Time 12/22/2011 18:13   Final    Colony Count >=100,000 COLONIES/ML   Final    Culture     Final    Value: Multiple bacterial morphotypes present, none predominant. Suggest appropriate recollection if clinically indicated.   Report Status 12/24/2011 FINAL  Final   CULTURE, BLOOD (ROUTINE X 2)     Status: Normal   Collection Time   12/22/11 10:35 PM      Component Value Range Status Comment   Specimen Description BLOOD RIGHT ARM   Final    Special Requests BOTTLES DRAWN AEROBIC ONLY 10CC   Final    Culture  Setup Time 12/23/2011 03:55   Final    Culture NO GROWTH 5 DAYS   Final    Report Status 12/29/2011 FINAL   Final   CULTURE, BLOOD (ROUTINE X 2)     Status: Normal   Collection Time   12/22/11 10:45 PM      Component Value Range Status Comment   Specimen Description BLOOD LEFT ARM   Final    Special Requests BOTTLES DRAWN AEROBIC ONLY 10CC   Final    Culture  Setup Time 12/23/2011 03:56   Final     Culture NO GROWTH 5 DAYS   Final    Report Status 12/29/2011 FINAL   Final   MRSA PCR SCREENING     Status: Normal   Collection Time   12/23/11 12:22 AM      Component Value Range Status Comment   MRSA by PCR NEGATIVE  NEGATIVE Final   MRSA PCR SCREENING     Status: Normal   Collection Time   12/30/11  4:31 AM      Component Value Range Status Comment   MRSA by PCR NEGATIVE  NEGATIVE Final     Studies/Results: Dg Chest 2 View  12/29/2011  *RADIOLOGY REPORT*  Clinical Data: Chest pain  CHEST - 2 VIEW  Comparison: 12/23/2011  Findings: Previous median sternotomy and CABG procedure.  There is mild cardiac enlargement.  Scarring within the right midlung and right base is again noted.Blunting of the costophrenic angle is identified which appears similar to previous exam.  IMPRESSION:  1.  Pleural parenchymal scarring within the right midlung and right base. 2.  Chronic blunting of the right costophrenic angle which may represent scarring and/or pleural fluid. 3.  Cardiac enlargement.   Original Report Authenticated By: Signa Kell, M.D.    Dg Chest 2 View  12/23/2011  *RADIOLOGY REPORT*  Clinical Data: Shortness of breath and pneumonia.  History of asthma.  CHEST - 2 VIEW  Comparison: 12/22/2011  Findings:   Marked hyperinflation/COPD.  Median sternotomy and mitral valve repair.  Artifact degraded lateral view.  Mild cardiomegaly.  Small volume right-sided posterior pleural fluid and thickening.  No pneumothorax.  Multifocal pleural parenchymal scarring, greater right than left.  Improved right base aeration.  IMPRESSION: 1.  Improved right base air space disease. 2.  Hyperinflation/COPD with bilateral pleural parenchymal scarring. 3.  Similar posterior right-sided pleural thickening/fluid.   Original Report Authenticated By: Jeronimo Greaves, M.D.    Dg Chest 2 View  12/22/2011  *RADIOLOGY REPORT*  Clinical Data: Previous CABG.  Weakness.  CHEST - 2 VIEW  Comparison: 11/01/2011  Findings: There  has been previous median sternotomy, CABG and mitral valve replacement.  The left lung shows mild scarring.  The right lung shows moderate chronic pleural and parenchymal scarring the appearance is essentially unchanged since previous studies. One could question if there is some patchy infiltrate at the medial right base however.  No acute bony finding.  No evidence of pulmonary edema.  IMPRESSION: Previous CABG and mitral valve replacement.  Pleural and parenchymal scarring much more extensive on the right than the left.  Similar appearance to multiple previous  exams. I question if there could be increased patchy density in the medial right base. This raises the possibility of right base pneumonia.   Original Report Authenticated By: Thomasenia Sales, M.D.    Ct Head Wo Contrast  12/29/2011  *RADIOLOGY REPORT*  Clinical Data: Weakness  CT HEAD WITHOUT CONTRAST  Technique:  Contiguous axial images were obtained from the base of the skull through the vertex without contrast.  Comparison: 11/17/2010 and brain MRI 12/23/2011  Findings: No skull fracture is noted.  The paranasal sinuses and mastoid air cells are unremarkable.  Atherosclerotic calcifications of carotid siphon again noted.  No intracranial hemorrhage, mass effect or midline shift.  No acute infarction.  No mass lesion is noted on this unenhanced scan.  Stable mild cerebral atrophy.  IMPRESSION: No acute intracranial abnormality.  Mild cerebral atrophy again noted.   Original Report Authenticated By: Natasha Mead, M.D.    Mr Brain Wo Contrast  12/23/2011  *RADIOLOGY REPORT*  Clinical Data: Parkinsonism with worsening of symptoms.  MRI HEAD WITHOUT CONTRAST  Technique:  Multiplanar, multiecho pulse sequences of the brain and surrounding structures were obtained according to standard protocol without intravenous contrast.  Comparison: MRI 11/18/2010  Findings: Negative for acute infarct.  Mild age appropriate atrophy is present.  Mild chronic changes in  the white matter bilaterally are stable and compatible with chronic microvascular ischemia. Negative for intracranial hemorrhage or fluid collection.  Small dural based soft tissue mass anterior to the pons on the right is unchanged.  No contrast was administered however this would be typical for a meningioma which is stable.  This measures 7 x 12 mm.  There is no mass effect on the pons.  No other mass lesion is identified.  Mild mucosal thickening right maxillary sinus. Right mastoid sinus effusion, unchanged.  IMPRESSION: Chronic microvascular ischemic changes in the white matter.  No acute infarct.  Small meningioma to the right of the pons is unchanged from the prior study.  No significant mass effect on the pons is identified.   Original Report Authenticated By: Janeece Riggers, M.D.     Medications: Scheduled Meds:   . amiodarone  200 mg Oral Daily  . aspirin EC  325 mg Oral Daily  . bisacodyl  10 mg Rectal Daily  . carbidopa-levodopa  1 tablet Oral TID  . clonazePAM  1 mg Oral Q6H  . enoxaparin (LOVENOX) injection  40 mg Subcutaneous Q24H  . isosorbide mononitrate  15 mg Oral Daily  . levothyroxine  75 mcg Oral QAC breakfast  . multivitamin with minerals  1 tablet Oral Daily  . pantoprazole  40 mg Oral BID  . polyethylene glycol  17 g Oral QID  . primidone  25 mg Oral BID  . risperiDONE  1 mg Oral QHS  . simvastatin  20 mg Oral q1800  . sodium chloride  3 mL Intravenous Q12H  . Tamsulosin HCl  0.4 mg Oral q morning - 10a  . [DISCONTINUED] isosorbide mononitrate  15 mg Oral Daily  . [DISCONTINUED] pantoprazole  40 mg Oral Q0600      LOS: 1 day   Shylo Zamor M.D. [facility] 12/30/2011, 11:21 AM Pager: [phone]  If 7PM-7AM, please contact night-coverage [email or web address] Password [email or web address]

## 2011-12-30 NOTE — Telephone Encounter (Signed)
Is he in the hospital

## 2011-12-30 NOTE — Telephone Encounter (Signed)
Pts spouse called by to check on status of getting alternative med to [facility] due to cost. Pts spouse has been notified that Dr [name] wants pt to wait and discuss change of med after pt is discharged from hospital, because the hospital may change all of pts meds around, as noted.

## 2011-12-31 ENCOUNTER — Ambulatory Visit: Payer: Medicare Other | Admitting: Family Medicine

## 2011-12-31 DIAGNOSIS — R079 Chest pain, unspecified: Secondary | ICD-10-CM

## 2011-12-31 DIAGNOSIS — J45909 Unspecified asthma, uncomplicated: Secondary | ICD-10-CM

## 2011-12-31 DIAGNOSIS — E785 Hyperlipidemia, unspecified: Secondary | ICD-10-CM

## 2011-12-31 DIAGNOSIS — R1319 Other dysphagia: Secondary | ICD-10-CM

## 2011-12-31 LAB — URINE CULTURE

## 2011-12-31 MED ORDER — PRIMIDONE 50 MG PO TABS: 25.0000 mg | ORAL_TABLET | Freq: Two times a day (BID) | ORAL | Status: DC

## 2011-12-31 NOTE — Progress Notes (Signed)
Pts assessment unchanged from this am. Pts d/c'd via wheelchair to private vehicle with wife. D/c'd via wheelchair to private vehicle /

## 2011-12-31 NOTE — Discharge Summary (Signed)
Physician Discharge Summary  Patient ID: [name] MRN: [id number] DOB/AGE: 70/16/1942 70 y.o.  Admit date: 12/29/2011 Discharge date: 12/31/2011  Primary Care Physician:  Nelwyn Salisbury, MD  Discharge Diagnoses:    . Adult failure to thrive . PARKINSON'S DISEASE- tremor significantly improved after starting Mysoline  . Other dysphagia . HYPERTENSION . HYPERLIPIDEMIA . GERD . Dementia . Chest pain, mid sternal . Atrial fibrillation . Constipation . Benign essential tremor  Consults: Neurology, Dr. Thad Ranger   Discharge Medications:   Medication List     As of 12/31/2011  5:48 PM    STOP taking these medications         selegiline 5 MG tablet   Commonly known as: ELDEPRYL      TAKE these medications         amiodarone 200 MG tablet   Commonly known as: PACERONE   Take 1 tablet (200 mg total) by mouth daily.      aspirin EC 325 MG tablet   Take 325 mg by mouth daily.      bisacodyl 10 MG suppository   Commonly known as: DULCOLAX   Place 10 mg rectally as needed. For upset stomach      carbidopa-levodopa 25-100 MG per tablet   Commonly known as: SINEMET IR   Take 1 tablet by mouth 3 (three) times daily.      clonazePAM 1 MG tablet   Commonly known as: KLONOPIN   Take 1 tablet (1 mg total) by mouth every 6 (six) hours.      GENERLAC 10 GM/15ML Soln   Generic drug: lactulose (encephalopathy)   Take 10 g by mouth daily as needed. For soft stools per wife      hydrocortisone cream 1 %   Apply 1 application topically 2 (two) times daily as needed. For skin irritation when sweating      isosorbide mononitrate 30 MG 24 hr tablet   Commonly known as: IMDUR   Take 15 mg by mouth daily.      levothyroxine 75 MCG tablet   Commonly known as: SYNTHROID, LEVOTHROID   Take 75 mcg by mouth daily.      multivitamin with minerals Tabs   Take 1 tablet by mouth daily.      nitroGLYCERIN 0.4 MG SL tablet   Commonly known as: NITROSTAT   Place 0.4 mg under the  tongue every 5 (five) minutes as needed. For chest pain      pantoprazole 40 MG tablet   Commonly known as: PROTONIX   Take 40 mg by mouth 2 (two) times daily.      polyethylene glycol packet   Commonly known as: MIRALAX / GLYCOLAX   Take 17 g by mouth 4 (four) times daily.      pravastatin 40 MG tablet   Commonly known as: PRAVACHOL   Take 40 mg by mouth at bedtime.      primidone 50 MG tablet   Commonly known as: MYSOLINE   Take 0.5 tablets (25 mg total) by mouth 2 (two) times daily.      risperiDONE 1 MG tablet   Commonly known as: RISPERDAL   Take 1 mg by mouth at bedtime.      Tamsulosin HCl 0.4 MG Caps   Commonly known as: FLOMAX   Take 0.4 mg by mouth every morning.      temazepam 30 MG capsule   Commonly known as: RESTORIL   Take 30 mg by mouth at bedtime as needed.  For sleep          Brief H and P: For complete details please refer to admission H and P, but in brief, Patient is a 71 year old male with history of Parkinson's disease, coronary disease, hypertension, BPH, GERD, dementia was recently discharged from the hospital on 12/24/2011 after being admitted for pneumonia. History was obtained from the patient and his wife present in the room. Patient's wife was was frustrated that he is more weak and unable to care for him. Per his wife, his tremors had been getting worse, patient was started on Selegiline on Monday (3 days before the admission) along with Sinemet with no improvement in his tremors. Per his wife, he is not able to feed himself and is at risk at falls. MRI brain was done during the previous admission which showed no acute infarct. Patient also had one episode of chest pain midsternal this morning around 8:45 am which was relieved by 2 nitroglycerin. At the time of admission, patient had no chest pain.   Hospital Course:  Patient was admitted for atypical chest pain and ruled out for acute ACS. Patient had no chest pain throughout the hospitalization.  He had a recent 2-D echocardiogram done which showed EF of 55-60% in 09/2011 and was not repeated.  PARKINSON'S DISEASE with worsening tremors: Patient had the MRI done last admission which did not show any acute infarct or intracranial process. Selegiline which was recently started 3 days before the admission was discontinued and neurology was consulted. Patient was continued on Sinemet and started on Mysoline per neurology conditions. Patient had significant improvement in the tremors just after 24 hours of starting Mysoline. He should titrate it up outpatient and follow up with Dr. Vickey Huger in 2 weeks.  HYPERTENSION: Stable, continue cardiac medication   Atrial fibrillation: Rate controlled, not on anticoagulation with fall risk from Parkinson's   GERD: Place on PPI   HYPERLIPIDEMIA: Continue Pravachol   Other dysphagia: swallow evaluation was done and recommended dysphagia 2 diet   Adult failure to thrive: PT OT consults were obtained and patient did extremely well with physical therapy. Home PT, OT, RN, ST and home health aide was added. Discussed with patient and his wife and they adamantly refused SNF.  Constipation: resolved   Day of Discharge BP 101/68  Pulse 77  Temp 97.3 F (36.3 C) (Oral)  Resp 18  Ht 6\' 3"  (1.905 m)  Wt 102.468 kg (225 lb 14.4 oz)  BMI 28.24 kg/m2  SpO2 97%  Physical Exam: General: Alert and awake oriented x3 not in any acute distress. HEENT: anicteric sclera, pupils reactive to light and accommodation CVS: S1-S2 clear no murmur rubs or gallops Chest: clear to auscultation bilaterally, no wheezing rales or rhonchi Abdomen: soft nontender, nondistended, normal bowel sounds, no organomegaly Extremities: no cyanosis, clubbing or edema noted bilaterally, resting tremor improving Neuro: Cranial nerves II-XII intact, no focal neurological deficits   The results of significant diagnostics from this hospitalization (including imaging, microbiology,  ancillary and laboratory) are listed below for reference.    LAB RESULTS: Basic Metabolic Panel:  Lab 12/30/11 1610 12/29/11 2152 12/29/11 1332  NA 141 -- 140  K 3.9 -- 4.6  CL 108 -- 106  CO2 22 -- 24  GLUCOSE 99 -- 104*  BUN 8 -- 11  CREATININE 1.17 1.15 --  CALCIUM 8.7 -- 8.9  MG -- -- --  PHOS -- -- --   Liver Function Tests:  Lab 12/29/11 1332  AST 13  ALT 5  ALKPHOS 64  BILITOT 0.5  PROT 6.4  ALBUMIN 3.4*   CBC:  Lab 12/30/11 0608 12/29/11 2152 12/29/11 1332  WBC 4.8 6.0 --  NEUTROABS -- -- 5.4  HGB 9.0* 9.3* --  HCT 29.7* 29.9* --  MCV 78.4 -- --  PLT 191 183 --   Cardiac Enzymes:  Lab 12/30/11 0608 12/29/11 2352  CKTOTAL 31 33  CKMB 1.8 1.8  CKMBINDEX -- --  TROPONINI <0.30 <0.30    Significant Diagnostic Studies:  Dg Chest 2 View  12/29/2011  *RADIOLOGY REPORT*  Clinical Data: Chest pain  CHEST - 2 VIEW  Comparison: 12/23/2011  Findings: Previous median sternotomy and CABG procedure.  There is mild cardiac enlargement.  Scarring within the right midlung and right base is again noted.Blunting of the costophrenic angle is identified which appears similar to previous exam.  IMPRESSION:  1.  Pleural parenchymal scarring within the right midlung and right base. 2.  Chronic blunting of the right costophrenic angle which may represent scarring and/or pleural fluid. 3.  Cardiac enlargement.   Original Report Authenticated By: Signa Kell, M.D.    Ct Head Wo Contrast  12/29/2011  *RADIOLOGY REPORT*  Clinical Data: Weakness  CT HEAD WITHOUT CONTRAST  Technique:  Contiguous axial images were obtained from the base of the skull through the vertex without contrast.  Comparison: 11/17/2010 and brain MRI 12/23/2011  Findings: No skull fracture is noted.  The paranasal sinuses and mastoid air cells are unremarkable.  Atherosclerotic calcifications of carotid siphon again noted.  No intracranial hemorrhage, mass effect or midline shift.  No acute infarction.  No mass  lesion is noted on this unenhanced scan.  Stable mild cerebral atrophy.  IMPRESSION: No acute intracranial abnormality.  Mild cerebral atrophy again noted.   Original Report Authenticated By: Natasha Mead, M.D.      Disposition and Follow-up: Discharge Orders    Future Appointments: Provider: Department: Dept Phone: Center:   01/05/2012 2:00 PM Lewayne Bunting, MD [facility] [place] [phone] [email or web address]   02/02/2012 2:00 PM Lewayne Bunting, MD [facility] [place] [phone] [email or web address]     Future Orders Please Complete By Expires   Diet - low sodium heart healthy      Increase activity slowly          DISPOSITION: home with Home PT, OT, ST, RN, home health aide DIET: dysphagia 2  ACTIVITY: as tolerated  DISCHARGE FOLLOW-UP Follow-up Information    Follow up with Nelwyn Salisbury, MD. Schedule an appointment as soon as possible for a visit in 7 days. (for hospital follow-up)    Contact information:   [address] Christena Flake [facility] [place] [address] [phone]       Follow up with DOHMEIER,CARMEN, MD. Schedule an appointment as soon as possible for a visit in 2 weeks. (for hospital follow-up)    Contact information:   [address], [address] [address] [facility] [place] [address] [phone]          Time spent on Discharge: 45 minutes  Signed:   Christophe Rising M.D. [facility] 12/31/2011, 5:48 PM Pager: [phone]

## 2011-12-31 NOTE — Progress Notes (Signed)
Physical Therapy Treatment Patient Details Name: [name] MRN: [id number] DOB: Jan 01, 1941 Today's Date: 12/31/2011 Time: 4098-1191 PT Time Calculation (min): 19 min  PT Assessment / Plan / Recommendation Comments on Treatment Session  Pt scheduled for d/c home today and wife requesting PT session to assess mobility prior to leaving. Discussed possible transiton to OPPT for BIG program (Parkinson's program) once done with HHPT. Wife reports one goal for HHPT is car transfers as she cannot currently assist pt in/out of the car.    Follow Up Recommendations  Home health PT;Supervision for mobility/OOB     Does the patient have the potential to tolerate intense rehabilitation     Barriers to Discharge        Equipment Recommendations  None recommended by PT    Recommendations for Other Services    Frequency Min 3X/week   Plan Discharge plan remains appropriate;Frequency remains appropriate    Precautions / Restrictions Precautions Precautions: Fall   Pertinent Vitals/Pain Denies pain     Mobility  Bed Mobility Bed Mobility: Not assessed Transfers Transfers: Sit to Stand;Stand to Sit Sit to Stand: 4: Min guard;With upper extremity assist;With armrests;From chair/3-in-1 Stand to Sit: 4: Min guard;With upper extremity assist;With armrests;To chair/3-in-1 Details for Transfer Assistance: pt moves very slowly and nearly required assist mid-way up to standing, however was able to complete task without physical assist Ambulation/Gait Ambulation/Gait Assistance: 4: Min guard Ambulation Distance (Feet): 150 Feet Assistive device: Rolling walker Ambulation/Gait Assistance Details: safe use of RW except when turning (tended to turn RW too far around himself before he began to move his feet) Gait Pattern: Step-through pattern;Decreased stride length;Trunk flexed Gait velocity: able to change gait speed with VC's    Exercises     PT Diagnosis:    PT Problem List:   PT  Treatment Interventions:     PT Goals Acute Rehab PT Goals Pt will go Sit to Stand: with supervision PT Goal: Sit to Stand - Progress: Progressing toward goal Pt will go Stand to Sit: with supervision PT Goal: Stand to Sit - Progress: Progressing toward goal Pt will Transfer Bed to Chair/Chair to Bed: with supervision PT Transfer Goal: Bed to Chair/Chair to Bed - Progress: Progressing toward goal Pt will Ambulate: >150 feet;with supervision;with least restrictive assistive device PT Goal: Ambulate - Progress: Progressing toward goal  Visit Information  Last PT Received On: 12/31/11 Assistance Needed: +1    Subjective Data  Subjective: Pt reports he is walking about as well as usual. Feels generally weak Patient Stated Goal: return home today and begin HHPT   Cognition  Overall Cognitive Status: Appears within functional limits for tasks assessed/performed Arousal/Alertness: Awake/alert Orientation Level: Appears intact for tasks assessed Behavior During Session: [facility] for tasks performed    Balance     End of Session PT - End of Session Equipment Utilized During Treatment: Gait belt Activity Tolerance: Patient tolerated treatment well Patient left: in chair;with call bell/phone within reach;with family/visitor present   GP Functional Assessment Tool Used: clinical judgement Functional Limitation: Mobility: Walking and moving around Mobility: Walking and Moving Around Current Status [phone]): At least 1 percent but less than 20 percent impaired, limited or restricted Mobility: Walking and Moving Around Goal Status [phone]): At least 1 percent but less than 20 percent impaired, limited or restricted Mobility: Walking and Moving Around Discharge Status [phone]): At least 1 percent but less than 20 percent impaired, limited or restricted   [name] 12/31/2011, 12:51 PM  Pager [phone]

## 2011-12-31 NOTE — Telephone Encounter (Signed)
Pt is in the hospital.

## 2012-01-04 ENCOUNTER — Telehealth: Payer: Self-pay | Admitting: Cardiology

## 2012-01-04 MED ORDER — FUROSEMIDE 20 MG PO TABS: 20.0000 mg | ORAL_TABLET | ORAL | Status: DC | PRN

## 2012-01-04 NOTE — Telephone Encounter (Signed)
**Note De-Identified  Obfuscation** Per Dr. Dimas Aguas, pt's wife, is advised that pt. may take 20 mg of Furosemide as needed for edema, she verbalized understanding. RX sent to [facility] in [place].

## 2012-01-04 NOTE — Telephone Encounter (Signed)
New Problem:    Patient called in wanting to speak with you again.  Please call back.

## 2012-01-04 NOTE — Telephone Encounter (Signed)
New Problem:    Patient's wife called in because her husbands hands and feet are really swollen and she would like a prescription for Lasix sent to the pharmacy on file.  Please call back.

## 2012-01-05 ENCOUNTER — Ambulatory Visit: Payer: Medicare Other | Admitting: Cardiology

## 2012-01-05 ENCOUNTER — Telehealth: Payer: Self-pay | Admitting: Family Medicine

## 2012-01-05 NOTE — Telephone Encounter (Signed)
noted 

## 2012-01-05 NOTE — Telephone Encounter (Signed)
[name] the OT with [facility] called stating that they will resume the OT two times per week for 4 weeks for ADL's and strengthening as the patient has just been released from the hospital.

## 2012-01-06 ENCOUNTER — Telehealth: Payer: Self-pay | Admitting: Cardiology

## 2012-01-06 NOTE — Telephone Encounter (Signed)
Spoke with pt wife, Aware of dr crenshaw's recommendations.  

## 2012-01-06 NOTE — Telephone Encounter (Signed)
Agree with plan [name]  

## 2012-01-06 NOTE — Telephone Encounter (Signed)
Spoke with pt wife, the pt has for the last several days had swelling in his feet and hands. She reports he has been drinking a lot of water this week. Pt denies SOB. He has started PT and OT this week and is having soreness but no SOB. Pt wife instructed to not push fluids anymore and Will forward for dr Jens Som review

## 2012-01-06 NOTE — Telephone Encounter (Signed)
plz return call to wife Bjorn Loser [phone] regarding pt hands, feet and legs swelling,

## 2012-01-10 ENCOUNTER — Encounter: Payer: Self-pay | Admitting: Family Medicine

## 2012-01-10 ENCOUNTER — Ambulatory Visit (INDEPENDENT_AMBULATORY_CARE_PROVIDER_SITE_OTHER): Payer: Medicare Other | Admitting: Family Medicine

## 2012-01-10 VITALS — BP 116/68 | HR 75 | Temp 97.7°F

## 2012-01-10 DIAGNOSIS — G20 Parkinson's disease: Secondary | ICD-10-CM

## 2012-01-10 DIAGNOSIS — I4891 Unspecified atrial fibrillation: Secondary | ICD-10-CM

## 2012-01-10 DIAGNOSIS — I10 Essential (primary) hypertension: Secondary | ICD-10-CM

## 2012-01-10 DIAGNOSIS — F068 Other specified mental disorders due to known physiological condition: Secondary | ICD-10-CM

## 2012-01-10 DIAGNOSIS — F329 Major depressive disorder, single episode, unspecified: Secondary | ICD-10-CM

## 2012-01-11 ENCOUNTER — Encounter: Payer: Self-pay | Admitting: Family Medicine

## 2012-01-11 NOTE — Progress Notes (Signed)
  Subjective:    Patient ID: [name], male    DOB: 1940-03-29, 71 y.o.   MRN: [id number]  HPI Here with his wife to follow up a hospital stay from 12-29-11 to 12-31-11 for failure to thrive, weakness, and tremors. He has had tremors for years due to his Parkinsons disease, but he had suddenly gotten them much worse that week. Just prior to this he had been [place], and it was felt that the tremors were a result of this. He was seen by Dr. Jodi Mourning, the inpatient Neurologist, and he switched him from this to [place]. This has been very successful. [name] has much less dramatic tremors, he has more strength and a better appetite. He is getting PT and OT at home 3 days a week. He is scheduled to follow up with Dr. Vickey Huger in about 2 weeks.    Review of Systems  Constitutional: Positive for fatigue.  Respiratory: Negative.   Cardiovascular: Negative.   Gastrointestinal: Negative.   Neurological: Positive for tremors and weakness. Negative for dizziness, seizures, syncope, facial asymmetry, speech difficulty, light-headedness, numbness and headaches.       Objective:   Physical Exam  Constitutional: He is oriented to person, place, and time.       Alert, in his wheelchair  Cardiovascular: Normal rate, normal heart sounds and intact distal pulses.   No murmur heard.      Irregular rhythm  Pulmonary/Chest: Effort normal and breath sounds normal.  Neurological: He is alert and oriented to person, place, and time. No cranial nerve deficit.       Mild resting tremors of the hands and the head  Psychiatric: He has a normal mood and affect. His behavior is normal. Judgment and thought content normal.          Assessment & Plan:  His tremors are much improved on Mysoline, and he is happier and has more strength. His appetite has improved. We will stay on the current regimen.

## 2012-01-15 ENCOUNTER — Other Ambulatory Visit: Payer: Self-pay | Admitting: Family Medicine

## 2012-01-17 MED ORDER — ISOSORBIDE MONONITRATE ER 30 MG PO TB24: 15.0000 mg | ORAL_TABLET | Freq: Every day | ORAL | Status: DC

## 2012-01-17 NOTE — Telephone Encounter (Addendum)
Pt also needs isosorbide mononitrate 30 mg. Pt takes half daily. Pt also needs refill on clonazepam 1 mg

## 2012-01-17 NOTE — Telephone Encounter (Signed)
Okay for 6 months 

## 2012-01-18 ENCOUNTER — Telehealth: Payer: Self-pay | Admitting: Cardiology

## 2012-01-18 ENCOUNTER — Telehealth: Payer: Self-pay | Admitting: Family Medicine

## 2012-01-18 ENCOUNTER — Other Ambulatory Visit: Payer: Self-pay | Admitting: Family Medicine

## 2012-01-18 NOTE — Telephone Encounter (Signed)
Pt spouse called and said that pt is out of clonazePAM (KLONOPIN) 1 MG tablet and needs refill called in to [facility] - [place], [place] - [address]. Call and speak directly to the Pharmacist - Tereso Newcomer or anyone in charge. Pt has had 3 angina attacks and needs this called in today. Has been out of med since 01/15/12.

## 2012-01-18 NOTE — Telephone Encounter (Signed)
Spoke to patient's wife stated patient has been having frequent angina.States patient has been taking isosorbide 30 mg 1/2 daily.Patient has been taking NTG sl as directed and if 3rd NTG does not relieve pain he takes 1/2 aspirin.States he is having another angina attack now and has took NTG x 2.Advised to go to ER if pain not relieved States patient has appointment with Dr.Crenshaw in [facility] 12/11/3 would like to be seen sooner.Message sent to Dr.Crenshaw's nurse.

## 2012-01-18 NOTE — Telephone Encounter (Signed)
plz return call to pt wife [phone] regarding pt Angina attacks, which seem to be getting worse. Pt c/o of Chest pain and SOB  At this time.    Xfer to Triage

## 2012-01-18 NOTE — Telephone Encounter (Signed)
I spoke with pharmacy and they will call pt when ready for pick up.

## 2012-01-18 NOTE — Telephone Encounter (Signed)
Spoke with pt wife, on 01-14-12 after eating at 5:15pm the pt had chest pain. He was given 3 NTG and 1/2 ASA. At 6:40pm he was relieved. 01-15-12 10:45a chest pain, relief at 11:30a after 3 NTG and 1/2 ASA. 01-17-12 chest pain, relief after 3 NTG and 1/2 ASA at 5:30pm. Today at present the chest pain is gone after 3 NTG, no ASA. The pain is usually a 8 to 9 on pain scale. Will make dr Jens Som aware.

## 2012-01-27 ENCOUNTER — Telehealth: Payer: Self-pay | Admitting: Cardiology

## 2012-01-27 NOTE — Telephone Encounter (Signed)
Spoke with pt wife, explained that medicine was started while the pt was in the hosp for his parkinson's tremors. Explained to wife she will need to call the neurology office to let them know because they would know better if anything needs to be done. Wife voiced understanding.

## 2012-01-27 NOTE — Telephone Encounter (Signed)
Pt was in the hospital and he  was given 13 meds , he was given primidone she says for afib, was to take 1/2 tab twice a day, had #60 on 11-8, now has 4 pills left, so somehow he has taken 56 pills so far, looks like taking 1 tab in the am and 1 tab in the pm , wife said she was administering it so it was her fault if that's what happened, she first wants to make sure she has hurt him by given him too much med, and that he now needs a refill, has appt with crenshaw 02-02-12, she not sure if he needs to continue the med ,  how many more he needs and for how long he needs to take it, pls advise

## 2012-01-31 ENCOUNTER — Telehealth: Payer: Self-pay | Admitting: Family Medicine

## 2012-01-31 NOTE — Telephone Encounter (Signed)
[name] is requesting verbal order to extend PT for patient for 2 times a week for 2 additional weeks starting 01/31/12. Loraine Leriche states it is ok to give him or his supervisor Joyce Gross the verbal order

## 2012-01-31 NOTE — Telephone Encounter (Signed)
I spoke with [place] and gave the verbal orders.

## 2012-01-31 NOTE — Telephone Encounter (Signed)
Okay for more OT 

## 2012-01-31 NOTE — Telephone Encounter (Signed)
Okay for more OT

## 2012-01-31 NOTE — Telephone Encounter (Signed)
Occu thertapist would like verbal order to continue OT for 2 times /wk for 2 weeks. Pt having problems.  EMS was there today and encouraged pt to go to hospital today do ANGINA, and pt refused. (per PT)  Pls call Harriett Sine (OT)[phone]

## 2012-01-31 NOTE — Telephone Encounter (Signed)
This is okay.

## 2012-01-31 NOTE — Telephone Encounter (Signed)
I spoke with [place] and gave verbal order.

## 2012-02-02 ENCOUNTER — Encounter: Payer: Self-pay | Admitting: Cardiology

## 2012-02-02 ENCOUNTER — Ambulatory Visit (INDEPENDENT_AMBULATORY_CARE_PROVIDER_SITE_OTHER): Payer: Medicare Other | Admitting: Cardiology

## 2012-02-02 VITALS — BP 85/57 | HR 78 | Wt 235.0 lb

## 2012-02-02 DIAGNOSIS — R072 Precordial pain: Secondary | ICD-10-CM

## 2012-02-02 DIAGNOSIS — I2510 Atherosclerotic heart disease of native coronary artery without angina pectoris: Secondary | ICD-10-CM

## 2012-02-02 DIAGNOSIS — E785 Hyperlipidemia, unspecified: Secondary | ICD-10-CM

## 2012-02-02 DIAGNOSIS — R0789 Other chest pain: Secondary | ICD-10-CM

## 2012-02-02 DIAGNOSIS — Z9889 Other specified postprocedural states: Secondary | ICD-10-CM

## 2012-02-02 DIAGNOSIS — I4891 Unspecified atrial fibrillation: Secondary | ICD-10-CM

## 2012-02-02 DIAGNOSIS — G20 Parkinson's disease: Secondary | ICD-10-CM

## 2012-02-02 NOTE — Assessment & Plan Note (Signed)
Continue statin. 

## 2012-02-02 NOTE — Assessment & Plan Note (Addendum)
Patient continues to have chest pain. Etiology unclear.  He has had an extensive workup including chest CT that was negative for dissection, echocardiogram that showed normal LV function, and cardiac catheterization that did not demonstrate significant coronary disease and VQ scan that was negative; EGD showed hiatal hernia. Continue Protonix. I am not convinced his pain is cardiac in etiology. I cannot advance his nitrates as his blood pressure is low. He also has a history of orthostatic hypotension.

## 2012-02-02 NOTE — Assessment & Plan Note (Signed)
Patient remains in sinus rhythm on examination. Continued amiodarone and aspirin. Not a Coumadin candidate given history of falls.

## 2012-02-02 NOTE — Assessment & Plan Note (Signed)
Management per neurology.

## 2012-02-02 NOTE — Patient Instructions (Addendum)
Your physician recommends that you schedule a follow-up appointment in: 3 MONTHS WITH DR Jens Som IN [place]

## 2012-02-02 NOTE — Assessment & Plan Note (Signed)
Continue SBE prophylaxis. 

## 2012-02-02 NOTE — Progress Notes (Signed)
HPI: Pleasant male with past medical history of coronary artery disease and atrial fibrillation for followup. On July 12 of 2011 the patient underwent two-vessel coronary artery bypass grafting (left internal mammary artery to LAD, saphenous vein graft to obtuse marginal) and MV repair with a 22-mm Edwards ring and oversewing of left atrial appendage. Patient also with orthostatic hypotension. Carotid Dopplers in April of 2013 showed 0-39% bilateral stenosis and followup recommended in one year. Patient admitted in August of 2013 with chest pain. Patient ruled out for myocardial infarction with serial enzymes. Cardiac catheterization on 10/05/2011 showed high-grade LAD disease but no significant disease in the RCA or LCX, atretic saphenous vein graft to OM, possible flap in the proximal left subclavian and the LIMA was not visualized. There was concern for aortic dissection. A CTA however showed no evidence of this. TEE showed normal LV function, a thrombosed left atrial appendage, prior mitral valve repair with mild to moderate mitral regurgitation. Cardiac MRI showed a large left atrial appendage filled with thrombus. Ejection fraction was 68%. Moderate left atrial enlargement. Status post mitral valve repair with annuloplasty ring with some disruption of lateral ring with perivalvular MR. Patient readmitted with chest pain and had repeat cardiac catheterization as at time of previous cath LIMA was not well visualized. There was an eccentric appearing lesion in the ostium of the LAD but not obstructive. IVUS showed minimal plaque and mild calcification of the proximal LAD and no significant stenosis.The LIMA was atretic. Medical therapy recommended. VQ scan in September 2013 very low probability. EGD in September of 2013 showed a hiatal hernia. Patient was hospitalized in November of 2013 with study to thrive, weakness and tremors. Since I last saw him he continues to have bouts of chest pain. It is  substernal/epigastric without radiation. There is dyspnea but no nausea. The pain is not pleuritic or positional. It is associated with stress and occasionally activities. It can last up to one hour. He has some dyspnea but he has not had syncope.  Current Outpatient Prescriptions  Medication Sig Dispense Refill  . amiodarone (PACERONE) 200 MG tablet Take 1 tablet (200 mg total) by mouth daily.      Marland Kitchen aspirin EC 325 MG tablet Take 325 mg by mouth daily.      . bisacodyl (DULCOLAX) 10 MG suppository Place 10 mg rectally as needed. For upset stomach      . carbidopa-levodopa (SINEMET) 25-100 MG per tablet Take 1 tablet by mouth 3 (three) times daily.       . clonazePAM (KLONOPIN) 1 MG tablet Take 1 tablet (1 mg total) by mouth every 6 (six) hours.  120 tablet  5  . desvenlafaxine (PRISTIQ) 50 MG 24 hr tablet Take 50 mg by mouth daily.      . furosemide (LASIX) 20 MG tablet Take 1 tablet (20 mg total) by mouth as needed (for edema).  30 tablet  0  . hydrocortisone cream 1 % Apply 1 application topically 2 (two) times daily as needed. For skin irritation when sweating      . isosorbide mononitrate (IMDUR) 30 MG 24 hr tablet Take 0.5 tablets (15 mg total) by mouth daily.  30 tablet  6  . lactulose, encephalopathy, (GENERLAC) 10 GM/15ML SOLN Take 10 g by mouth daily as needed. For soft stools per wife      . levothyroxine (SYNTHROID, LEVOTHROID) 75 MCG tablet Take 75 mcg by mouth daily.      . Multiple Vitamin (MULITIVITAMIN WITH MINERALS)  TABS Take 1 tablet by mouth daily.      . nitroGLYCERIN (NITROSTAT) 0.4 MG SL tablet Place 0.4 mg under the tongue every 5 (five) minutes as needed. For chest pain      . pantoprazole (PROTONIX) 40 MG tablet Take 40 mg by mouth 2 (two) times daily.      . polyethylene glycol (MIRALAX / GLYCOLAX) packet Take 17 g by mouth 4 (four) times daily.       . pravastatin (PRAVACHOL) 40 MG tablet Take 40 mg by mouth at bedtime.       . primidone (MYSOLINE) 50 MG tablet Take  0.5 tablets (25 mg total) by mouth 2 (two) times daily.  60 tablet  3  . risperiDONE (RISPERDAL) 1 MG tablet Take 1 mg by mouth at bedtime.      . Tamsulosin HCl (FLOMAX) 0.4 MG CAPS Take 0.4 mg by mouth every morning.       . temazepam (RESTORIL) 30 MG capsule Take 30 mg by mouth at bedtime as needed. For sleep         Past Medical History  Diagnosis Date  . PE (pulmonary embolism)     a. >20 years ago;  b. nl V:Q scan 10/2010  . DVT (deep venous thrombosis) 01/2007    bilateral  . Meningioma   . BPH (benign prostatic hyperplasia)   . Personal history of colonic polyps   . Venereal wart   . Depression   . Asthma   . Chickenpox   . Thrombophlebitis   . GERD (gastroesophageal reflux disease)   . Parkinson disease   . Dementia   . CAD (coronary artery disease)     a. 08/2009 s/p CABG x 2 (LIMA->LAD, SVG-OM). b. Adm w/ CP 09/2011 - questionable functionality of LIMA graft but patient declined further catheterization (cath 10/05/11 -- ostial LAD disease, possible flap in prox L subclavian/unable to image LIMA well, atretic SVG-OM)  . Hyperlipidemia   . Stricture and stenosis of esophagus   . Diverticulosis of colon (without mention of hemorrhage)   . Mitral regurgitation     a. 08/2009 s/p MV repair 22mm Edwards ring and oversewing of LA appendage @ time of CABG;  b. cardiac MRI 09/2011 showing some disruption of lateral annuloplasty ring with "perivalvular" MR and some central MR   . Thrombus of left atrial appendage     a. Abnormal dye staining on cath 09/2011 - imaged by CT/MRI/TEE - felt to be surgically isolated from the LA  . Anxiety   . Stroke   . Atrial fibrillation     a. Paroxysmal, not Coumadin candidate 2/2 Parkinson's/gait instability  . Brain tumor     "right side of my brain" (12/22/2011)  . Arthritis     "very little" (12/22/2011)  . Pulmonary embolism 01/2007  . Aspiration pneumonia 03/2008  . Hiatal hernia     PARKINSONS TREMORS    Past Surgical History    Procedure Date  . Varicose vein surgery     left leg  . Mitral valve repair 08/2009  . Umbilical hernia repair   . Tee without cardioversion 10/06/2011    Procedure: TRANSESOPHAGEAL ECHOCARDIOGRAM (TEE);  Surgeon: Vesta Mixer, MD;  Location: [facility] ENDOSCOPY;  Service: Cardiovascular;  Laterality: N/A;  . Cardiac catheterization   . Esophagogastroduodenoscopy 11/15/2011    Procedure: ESOPHAGOGASTRODUODENOSCOPY (EGD);  Surgeon: Meryl Dare, MD,FACG;  Location: [facility] ENDOSCOPY;  Service: Endoscopy;  Laterality: N/A;  . Tonsillectomy and adenoidectomy   .  Mastoidectomy     "right; I'm really not sure what they did to my mastoid" (12/22/2011)  . Inguinal hernia repair     right  . Lumbar disc surgery     "Dr. Jeral Fruit; herniated disc & remove bone spurs"  . Back surgery   . Coronary artery bypass graft 08/2009    CABG X2    History   Social History  . Marital Status: Married    Spouse Name: N/A    Number of Children: 2  . Years of Education: N/A   Occupational History  .      Retired   Social History Main Topics  . Smoking status: Former Smoker -- 1.0 packs/day for 10 years    Types: Cigarettes    Quit date: 02/22/1953  . Smokeless tobacco: Never Used  . Alcohol Use: No  . Drug Use: No  . Sexually Active: No   Other Topics Concern  . 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 Remaining systems are negative.  Physical Exam: Well-developed chronically ill appearing in no acute distress.  Skin is warm and dry.  HEENT is normal.  Neck is supple.  Chest is clear to auscultation  with normal expansion.  Cardiovascular exam is regular rate and rhythm.  Abdominal exam nontender or distended. No masses palpated. Extremities show no edema. neuro resting tremor, Parkinson's

## 2012-02-02 NOTE — Assessment & Plan Note (Signed)
Continue aspirin and statin. 

## 2012-02-04 ENCOUNTER — Emergency Department (HOSPITAL_COMMUNITY): Payer: Medicare Other

## 2012-02-04 ENCOUNTER — Encounter (HOSPITAL_COMMUNITY): Payer: Self-pay | Admitting: *Deleted

## 2012-02-04 ENCOUNTER — Telehealth: Payer: Self-pay | Admitting: Cardiology

## 2012-02-04 ENCOUNTER — Observation Stay (HOSPITAL_COMMUNITY)
Admission: EM | Admit: 2012-02-04 | Discharge: 2012-02-06 | Disposition: A | Discharge status: Home / Self Care | Payer: Medicare Other | Attending: Internal Medicine | Admitting: Internal Medicine

## 2012-02-04 DIAGNOSIS — I10 Essential (primary) hypertension: Secondary | ICD-10-CM

## 2012-02-04 DIAGNOSIS — F329 Major depressive disorder, single episode, unspecified: Secondary | ICD-10-CM

## 2012-02-04 DIAGNOSIS — I951 Orthostatic hypotension: Secondary | ICD-10-CM

## 2012-02-04 DIAGNOSIS — Z79899 Other long term (current) drug therapy: Secondary | ICD-10-CM | POA: Insufficient documentation

## 2012-02-04 DIAGNOSIS — I200 Unstable angina: Secondary | ICD-10-CM

## 2012-02-04 DIAGNOSIS — J309 Allergic rhinitis, unspecified: Secondary | ICD-10-CM

## 2012-02-04 DIAGNOSIS — N183 Chronic kidney disease, stage 3 unspecified: Secondary | ICD-10-CM

## 2012-02-04 DIAGNOSIS — F3289 Other specified depressive episodes: Secondary | ICD-10-CM

## 2012-02-04 DIAGNOSIS — R0602 Shortness of breath: Secondary | ICD-10-CM | POA: Insufficient documentation

## 2012-02-04 DIAGNOSIS — I809 Phlebitis and thrombophlebitis of unspecified site: Secondary | ICD-10-CM

## 2012-02-04 DIAGNOSIS — D649 Anemia, unspecified: Secondary | ICD-10-CM | POA: Insufficient documentation

## 2012-02-04 DIAGNOSIS — J690 Pneumonitis due to inhalation of food and vomit: Secondary | ICD-10-CM

## 2012-02-04 DIAGNOSIS — R1319 Other dysphagia: Secondary | ICD-10-CM

## 2012-02-04 DIAGNOSIS — K219 Gastro-esophageal reflux disease without esophagitis: Secondary | ICD-10-CM

## 2012-02-04 DIAGNOSIS — G250 Essential tremor: Secondary | ICD-10-CM

## 2012-02-04 DIAGNOSIS — R627 Adult failure to thrive: Secondary | ICD-10-CM

## 2012-02-04 DIAGNOSIS — I82409 Acute embolism and thrombosis of unspecified deep veins of unspecified lower extremity: Secondary | ICD-10-CM

## 2012-02-04 DIAGNOSIS — Z951 Presence of aortocoronary bypass graft: Secondary | ICD-10-CM | POA: Insufficient documentation

## 2012-02-04 DIAGNOSIS — D320 Benign neoplasm of cerebral meninges: Secondary | ICD-10-CM

## 2012-02-04 DIAGNOSIS — I2510 Atherosclerotic heart disease of native coronary artery without angina pectoris: Secondary | ICD-10-CM

## 2012-02-04 DIAGNOSIS — K224 Dyskinesia of esophagus: Secondary | ICD-10-CM

## 2012-02-04 DIAGNOSIS — F068 Other specified mental disorders due to known physiological condition: Secondary | ICD-10-CM | POA: Diagnosis present

## 2012-02-04 DIAGNOSIS — Z9181 History of falling: Secondary | ICD-10-CM | POA: Insufficient documentation

## 2012-02-04 DIAGNOSIS — R072 Precordial pain: Principal | ICD-10-CM | POA: Insufficient documentation

## 2012-02-04 DIAGNOSIS — K5900 Constipation, unspecified: Secondary | ICD-10-CM

## 2012-02-04 DIAGNOSIS — Z860100 Personal history of colon polyps, unspecified: Secondary | ICD-10-CM

## 2012-02-04 DIAGNOSIS — G20A1 Parkinson's disease without dyskinesia, without mention of fluctuations: Secondary | ICD-10-CM

## 2012-02-04 DIAGNOSIS — Z9889 Other specified postprocedural states: Secondary | ICD-10-CM

## 2012-02-04 DIAGNOSIS — I4891 Unspecified atrial fibrillation: Secondary | ICD-10-CM

## 2012-02-04 DIAGNOSIS — G20 Parkinson's disease: Secondary | ICD-10-CM | POA: Diagnosis present

## 2012-02-04 DIAGNOSIS — R269 Unspecified abnormalities of gait and mobility: Secondary | ICD-10-CM | POA: Insufficient documentation

## 2012-02-04 DIAGNOSIS — F0390 Unspecified dementia without behavioral disturbance: Secondary | ICD-10-CM | POA: Insufficient documentation

## 2012-02-04 DIAGNOSIS — N189 Chronic kidney disease, unspecified: Secondary | ICD-10-CM | POA: Insufficient documentation

## 2012-02-04 DIAGNOSIS — R42 Dizziness and giddiness: Secondary | ICD-10-CM | POA: Insufficient documentation

## 2012-02-04 DIAGNOSIS — J45909 Unspecified asthma, uncomplicated: Secondary | ICD-10-CM

## 2012-02-04 DIAGNOSIS — R110 Nausea: Secondary | ICD-10-CM | POA: Insufficient documentation

## 2012-02-04 DIAGNOSIS — R61 Generalized hyperhidrosis: Secondary | ICD-10-CM | POA: Insufficient documentation

## 2012-02-04 DIAGNOSIS — I5040 Unspecified combined systolic (congestive) and diastolic (congestive) heart failure: Secondary | ICD-10-CM

## 2012-02-04 DIAGNOSIS — R0789 Other chest pain: Secondary | ICD-10-CM | POA: Diagnosis present

## 2012-02-04 DIAGNOSIS — I513 Intracardiac thrombosis, not elsewhere classified: Secondary | ICD-10-CM

## 2012-02-04 DIAGNOSIS — Z87898 Personal history of other specified conditions: Secondary | ICD-10-CM

## 2012-02-04 DIAGNOSIS — E785 Hyperlipidemia, unspecified: Secondary | ICD-10-CM

## 2012-02-04 LAB — COMPREHENSIVE METABOLIC PANEL
ALT: <5 U/L (ref 0–53)
Albumin: 3.8 g/dL (ref 3.5–5.2)
Alkaline Phosphatase: 67 U/L (ref 39–117)
BUN: 18 mg/dL (ref 6–23)
Chloride: 105 mEq/L (ref 96–112)
Glucose, Bld: 114 mg/dL — ABNORMAL HIGH (ref 70–99)
Potassium: 4.6 mEq/L (ref 3.5–5.1)
Sodium: 141 mEq/L (ref 135–145)
Total Bilirubin: 0.3 mg/dL (ref 0.3–1.2)
Total Protein: 6.9 g/dL (ref 6.0–8.3)

## 2012-02-04 LAB — CBC WITH DIFFERENTIAL/PLATELET
Eosinophils Absolute: 0.1 10*3/uL (ref 0.0–0.7)
Hemoglobin: 10.5 g/dL — ABNORMAL LOW (ref 13.0–17.0)
Lymphs Abs: 1.2 10*3/uL (ref 0.7–4.0)
MCH: 24 pg — ABNORMAL LOW (ref 26.0–34.0)
Monocytes Relative: 6 % (ref 3–12)
Neutro Abs: 6.3 10*3/uL (ref 1.7–7.7)
Neutrophils Relative %: 78 % — ABNORMAL HIGH (ref 43–77)
Platelets: 200 10*3/uL (ref 150–400)
RBC: 4.38 MIL/uL (ref 4.22–5.81)
WBC: 8.1 10*3/uL (ref 4.0–10.5)

## 2012-02-04 MED ORDER — POLYETHYLENE GLYCOL 3350 17 G PO PACK
17.0000 g | PACK | Freq: Every day | ORAL | Status: DC
  Administered 2012-02-05 – 2012-02-06 (×2): 17 g via ORAL
  Filled 2012-02-04 (×2): qty 1

## 2012-02-04 MED ORDER — PRIMIDONE 50 MG PO TABS
25.0000 mg | ORAL_TABLET | Freq: Two times a day (BID) | ORAL | Status: DC
  Administered 2012-02-05 – 2012-02-06 (×4): 25 mg via ORAL
  Filled 2012-02-04 (×5): qty 0.5

## 2012-02-04 MED ORDER — HEPARIN (PORCINE) IN NACL 100-0.45 UNIT/ML-% IJ SOLN
1350.0000 [IU]/h | INTRAMUSCULAR | Status: DC
  Administered 2012-02-04: 1350 [IU]/h via INTRAVENOUS
  Filled 2012-02-04 (×2): qty 250

## 2012-02-04 MED ORDER — ZOLPIDEM TARTRATE 5 MG PO TABS: 5.0000 mg | ORAL_TABLET | Freq: Every evening | ORAL | Status: DC | PRN

## 2012-02-04 MED ORDER — HEPARIN BOLUS VIA INFUSION
4000.0000 [IU] | Freq: Once | INTRAVENOUS | Status: AC
  Administered 2012-02-04: 4000 [IU] via INTRAVENOUS
  Filled 2012-02-04: qty 4000

## 2012-02-04 MED ORDER — RISPERIDONE 1 MG PO TABS
1.0000 mg | ORAL_TABLET | Freq: Every day | ORAL | Status: DC
  Administered 2012-02-05 (×2): 1 mg via ORAL
  Filled 2012-02-04 (×4): qty 1

## 2012-02-04 MED ORDER — CARBIDOPA-LEVODOPA 25-100 MG PO TABS
1.0000 | ORAL_TABLET | Freq: Three times a day (TID) | ORAL | Status: DC
  Administered 2012-02-05 – 2012-02-06 (×5): 1 via ORAL
  Filled 2012-02-04 (×8): qty 1

## 2012-02-04 MED ORDER — BISACODYL 10 MG RE SUPP: 10.0000 mg | Freq: Every day | RECTAL | Status: DC | PRN

## 2012-02-04 MED ORDER — SODIUM CHLORIDE 0.9 % IJ SOLN: 3.0000 mL | INTRAMUSCULAR | Status: DC | PRN

## 2012-02-04 MED ORDER — SODIUM CHLORIDE 0.9 % IV SOLN: 250.0000 mL | INTRAVENOUS | Status: DC | PRN

## 2012-02-04 MED ORDER — TEMAZEPAM 15 MG PO CAPS: 30.0000 mg | ORAL_CAPSULE | Freq: Every evening | ORAL | Status: DC | PRN

## 2012-02-04 MED ORDER — ADULT MULTIVITAMIN W/MINERALS CH
1.0000 | ORAL_TABLET | Freq: Every morning | ORAL | Status: DC
  Administered 2012-02-05 – 2012-02-06 (×2): 1 via ORAL
  Filled 2012-02-04 (×2): qty 1

## 2012-02-04 MED ORDER — ISOSORBIDE MONONITRATE 15 MG HALF TABLET
15.0000 mg | ORAL_TABLET | Freq: Every day | ORAL | Status: DC
  Administered 2012-02-05 – 2012-02-06 (×2): 15 mg via ORAL
  Filled 2012-02-04 (×2): qty 1

## 2012-02-04 MED ORDER — HYDROMORPHONE HCL PF 1 MG/ML IJ SOLN: 0.5000 mg | INTRAMUSCULAR | Status: DC | PRN

## 2012-02-04 MED ORDER — TAMSULOSIN HCL 0.4 MG PO CAPS
0.4000 mg | ORAL_CAPSULE | Freq: Every morning | ORAL | Status: DC
  Administered 2012-02-05 – 2012-02-06 (×2): 0.4 mg via ORAL
  Filled 2012-02-04 (×2): qty 1

## 2012-02-04 MED ORDER — PANTOPRAZOLE SODIUM 40 MG PO TBEC
40.0000 mg | DELAYED_RELEASE_TABLET | Freq: Two times a day (BID) | ORAL | Status: DC
  Administered 2012-02-04 – 2012-02-06 (×4): 40 mg via ORAL
  Filled 2012-02-04 (×4): qty 1

## 2012-02-04 MED ORDER — SIMVASTATIN 20 MG PO TABS
20.0000 mg | ORAL_TABLET | Freq: Every day | ORAL | Status: DC
  Administered 2012-02-05: 20 mg via ORAL
  Filled 2012-02-04 (×2): qty 1

## 2012-02-04 MED ORDER — AMIODARONE HCL 200 MG PO TABS
200.0000 mg | ORAL_TABLET | Freq: Every morning | ORAL | Status: DC
  Administered 2012-02-05 – 2012-02-06 (×2): 200 mg via ORAL
  Filled 2012-02-04 (×2): qty 1

## 2012-02-04 MED ORDER — SODIUM CHLORIDE 0.9 % IJ SOLN
3.0000 mL | Freq: Two times a day (BID) | INTRAMUSCULAR | Status: DC
  Administered 2012-02-05 – 2012-02-06 (×2): 3 mL via INTRAVENOUS

## 2012-02-04 MED ORDER — NITROGLYCERIN 0.4 MG SL SUBL: 0.4000 mg | SUBLINGUAL_TABLET | SUBLINGUAL | Status: DC | PRN

## 2012-02-04 MED ORDER — CLONAZEPAM 1 MG PO TABS
1.0000 mg | ORAL_TABLET | Freq: Four times a day (QID) | ORAL | Status: DC
  Administered 2012-02-04 – 2012-02-06 (×6): 1 mg via ORAL
  Filled 2012-02-04 (×7): qty 1

## 2012-02-04 NOTE — Telephone Encounter (Signed)
Spoke with kay, aware it is difficult to distinguish the pts pain. Calling EMS was appropriate.

## 2012-02-04 NOTE — ED Provider Notes (Signed)
History     CSN: [id number]  Arrival date & time 02/04/12  1604   First MD Initiated Contact with Patient 02/04/12 1645     Chief Complaint  Patient presents with  . Chest Pain   HPI: [name] is a 71 yo CM with complex medical history including Parkinson's disease, PE, CAD s/p CABG, paroxysmal atrial fibrillation, depression and anxiety who presents with substernal chest pain. Symptoms started at 2 pm while his wife was attempting to get him into the shower. Pain described as pressure, non-radiating, 10/10, no exacerbating factors, relieved with nitro X 3 and ASA by his wife and EMS. Symptoms associated with diaphoresis, nausea and dizziness. No vomiting or syncope. Has had 3 episodes this week except without SOB, diaphoresis or dizziness. Dr. Jens Som saw them two days ago for recurrent chest pain. His wife is concerned that he continues to have frequent episodes of CP and would like to find out why.   Past Medical History  Diagnosis Date  . PE (pulmonary embolism)     a. >20 years ago;  b. nl V:Q scan 10/2010  . DVT (deep venous thrombosis) 01/2007    bilateral  . Meningioma   . BPH (benign prostatic hyperplasia)   . Personal history of colonic polyps   . Venereal wart   . Depression   . Asthma   . Chickenpox   . Thrombophlebitis   . GERD (gastroesophageal reflux disease)   . Parkinson disease   . Dementia   . CAD (coronary artery disease)     a. 08/2009 s/p CABG x 2 (LIMA->LAD, SVG-OM). b. Adm w/ CP 09/2011 - questionable functionality of LIMA graft but patient declined further catheterization (cath 10/05/11 -- ostial LAD disease, possible flap in prox L subclavian/unable to image LIMA well, atretic SVG-OM)  . Hyperlipidemia   . Stricture and stenosis of esophagus   . Diverticulosis of colon (without mention of hemorrhage)   . Mitral regurgitation     a. 08/2009 s/p MV repair 22mm Edwards ring and oversewing of LA appendage @ time of CABG;  b. cardiac MRI 09/2011 showing some  disruption of lateral annuloplasty ring with "perivalvular" MR and some central MR   . Thrombus of left atrial appendage     a. Abnormal dye staining on cath 09/2011 - imaged by CT/MRI/TEE - felt to be surgically isolated from the LA  . Anxiety   . Stroke   . Atrial fibrillation     a. Paroxysmal, not Coumadin candidate 2/2 Parkinson's/gait instability  . Brain tumor     "right side of my brain" (12/22/2011)  . Arthritis     "very little" (12/22/2011)  . Pulmonary embolism 01/2007  . Aspiration pneumonia 03/2008  . Hiatal hernia     PARKINSONS TREMORS    Past Surgical History  Procedure Date  . Varicose vein surgery     left leg  . Mitral valve repair 08/2009  . Umbilical hernia repair   . Tee without cardioversion 10/06/2011    Procedure: TRANSESOPHAGEAL ECHOCARDIOGRAM (TEE);  Surgeon: Vesta Mixer, MD;  Location: [facility] ENDOSCOPY;  Service: Cardiovascular;  Laterality: N/A;  . Cardiac catheterization   . Esophagogastroduodenoscopy 11/15/2011    Procedure: ESOPHAGOGASTRODUODENOSCOPY (EGD);  Surgeon: Meryl Dare, MD,FACG;  Location: [facility] ENDOSCOPY;  Service: Endoscopy;  Laterality: N/A;  . Tonsillectomy and adenoidectomy   . Mastoidectomy     "right; I'm really not sure what they did to my mastoid" (12/22/2011)  . Inguinal hernia repair  right  . Lumbar disc surgery     "Dr. Jeral Fruit; herniated disc & remove bone spurs"  . Back surgery   . Coronary artery bypass graft 08/2009    CABG X2    Family History  Problem Relation Age of Onset  . Arthritis    . Diabetes    . Stroke Father   . Heart disease Mother     History  Substance Use Topics  . Smoking status: Former Smoker -- 1.0 packs/day for 10 years    Types: Cigarettes    Quit date: 02/22/1953  . Smokeless tobacco: Never Used  . Alcohol Use: No     Review of Systems  Constitutional: Positive for diaphoresis and fatigue (appears chronic). Negative for fever, chills and appetite change.  HENT: Negative for  trouble swallowing, neck pain and neck stiffness.   Eyes: Negative for photophobia and visual disturbance.  Respiratory: Positive for shortness of breath. Negative for cough and wheezing.   Cardiovascular: Positive for chest pain. Negative for palpitations.  Gastrointestinal: Positive for nausea. Negative for vomiting, abdominal pain and diarrhea.  Genitourinary: Negative for dysuria, urgency, hematuria and decreased urine volume.  Musculoskeletal: Negative for myalgias, back pain and arthralgias.  Skin: Negative for pallor and rash.  Neurological: Positive for dizziness. Negative for syncope, weakness and light-headedness.  Psychiatric/Behavioral: Negative for confusion and agitation.  All other systems reviewed and are negative.   Allergies  Ibuprofen  Home Medications   Current Outpatient Rx  Name  Route  Sig  Dispense  Refill  . AMIODARONE HCL 200 MG PO TABS   Oral   Take 200 mg by mouth every morning.          . ASPIRIN EC 325 MG PO TBEC   Oral   Take 325 mg by mouth daily.         Marland Kitchen BISACODYL 10 MG RE SUPP   Rectal   Place 10 mg rectally as needed. For constipation         . CARBIDOPA-LEVODOPA 25-100 MG PO TABS   Oral   Take 1 tablet by mouth 3 (three) times daily.          Marland Kitchen CLONAZEPAM 1 MG PO TABS   Oral   Take 1 tablet (1 mg total) by mouth every 6 (six) hours.   120 tablet   5   . FUROSEMIDE 20 MG PO TABS   Oral   Take 20 mg by mouth daily as needed. For swelling         . HYDROCORTISONE 1 % EX CREA   Topical   Apply 1 application topically 2 (two) times daily as needed. For skin irritation when sweating         . ISOSORBIDE MONONITRATE ER 30 MG PO TB24   Oral   Take 0.5 tablets (15 mg total) by mouth daily.   30 tablet   6   . LEVOTHYROXINE SODIUM 75 MCG PO TABS   Oral   Take 75 mcg by mouth every morning.          . ADULT MULTIVITAMIN W/MINERALS CH   Oral   Take 1 tablet by mouth every morning.          Marland Kitchen NITROGLYCERIN 0.4 MG  SL SUBL   Sublingual   Place 0.4 mg under the tongue every 5 (five) minutes as needed. For chest pain         . PANTOPRAZOLE SODIUM 40 MG PO TBEC   Oral  Take 40 mg by mouth 2 (two) times daily.         Marland Kitchen POLYETHYLENE GLYCOL 3350 PO PACK   Oral   Take 17 g by mouth daily.          Marland Kitchen PRAVASTATIN SODIUM 40 MG PO TABS   Oral   Take 40 mg by mouth every evening.          Marland Kitchen PRIMIDONE 50 MG PO TABS   Oral   Take 25 mg by mouth 2 (two) times daily.         Marland Kitchen RISPERIDONE 1 MG PO TABS   Oral   Take 1 mg by mouth at bedtime.         . TAMSULOSIN HCL 0.4 MG PO CAPS   Oral   Take 0.4 mg by mouth every morning.          Marland Kitchen TEMAZEPAM 30 MG PO CAPS   Oral   Take 30 mg by mouth at bedtime as needed. For sleep         . ASPIRIN EC 325 MG PO TBEC   Oral   Take 650 mg by mouth once.           BP 106/68  Pulse 81  Temp 98 F (36.7 C) (Oral)  Resp 19  SpO2 98%  Physical Exam  Constitutional: He is oriented to person, place, and time. He is cooperative. No distress.       Overweight gentleman, well-groomed, pleasant and interactive.   HENT:  Head: Normocephalic and atraumatic.  Mouth/Throat: Oropharynx is clear and moist and mucous membranes are normal.  Eyes: Conjunctivae normal and EOM are normal. Pupils are equal, round, and reactive to light.  Neck: Trachea normal and normal range of motion.  Cardiovascular: Regular rhythm.  Exam reveals no decreased pulses.   Murmur heard.  Systolic murmur is present with a grade of 2/6  Pulmonary/Chest: Effort normal. No respiratory distress. He has rales (bibasilar).  Abdominal: Soft. Bowel sounds are normal.  Musculoskeletal: Normal range of motion. He exhibits edema (trace LE edema).       Fine left hand tremor at rest  Neurological: He is alert and oriented to person, place, and time. No cranial nerve deficit or sensory deficit. GCS eye subscore is 4. GCS verbal subscore is 5. GCS motor subscore is 6.  Skin: There is  pallor.    ED Course  Procedures  Labs Reviewed  CBC WITH DIFFERENTIAL - Abnormal; Notable for the following:    Hemoglobin 10.5 (*)     HCT 34.3 (*)     MCH 24.0 (*)     RDW 17.7 (*)     Neutrophils Relative 78 (*)     All other components within normal limits  COMPREHENSIVE METABOLIC PANEL - Abnormal; Notable for the following:    Glucose, Bld 114 (*)     Creatinine, Ser 1.45 (*)     GFR calc non Af Amer 47 (*)     GFR calc Af Amer 55 (*)     All other components within normal limits  PRO B NATRIURETIC PEPTIDE - Abnormal; Notable for the following:    Pro B Natriuretic peptide (BNP) 320.1 (*)     All other components within normal limits  PROTIME-INR - Abnormal; Notable for the following:    Prothrombin Time 15.7 (*)     All other components within normal limits  TROPONIN I  TROPONIN I  TSH  TROPONIN I  TROPONIN  I  HEPARIN LEVEL (UNFRACTIONATED)  CBC   Dg Chest 2 View  02/04/2012  *RADIOLOGY REPORT*  Clinical Data: Left chest pain, history smoking, atrial fibrillation, coronary artery disease post CABG, stroke, pulmonary embolism  CHEST - 2 VIEW  Comparison: 12/29/2011 Correlation:  CT chest 07/10/2028  Findings: Normal heart size post CABG and MVR. Tortuous aorta. Pulmonary vascularity normal. Chronic enlargement of right hilum unchanged, appears due to enlarged central pulmonary artery on the prior CT chest exam. Chronic atelectasis versus scarring right base. Lungs otherwise clear. No pleural effusion or pneumothorax.  IMPRESSION: No acute abnormalities. Chronic right basilar atelectasis versus scarring. Enlarged central pulmonary arteries.   Original Report Authenticated By: Ulyses Southward, M.D.    MDM  71 yo CM with complex medical history including Parkinson's disease, PE, CAD s/p CABG, paroxysmal atrial fibrillation, depression and anxiety who presents with substernal chest pain. ASA and nitro given by EMS. Afebrile, vital signs stable. Doubt PE as not tachycardic or  hypoxic, symptoms relieved with nitro. Doubt PNA as no cough, CXR without consolidation, not hypoxic. Doubt CHF as no edema on exam or CXR, no JVD, BNP 320. Concern for ACS as pain is c/w cardiac origin, no alternative diagnosis found on w/u. Labs: Hgb 10.5, WBC 8.1, troponin normal, Cr 1.45 (at baseline), normal lytes. Initial troponin negative, ECG without change from previous. Patient is high risk due to numerous co-morbidities and significant CAD, so felt he was not appropriate for outpatient management. Discussed with cardiology who recommended medicine admission. Admitted to Hospitalist service. No recurrent CP in ED. Discussed w/u with patient and his wife and need for further observation, they are in agreement with plan.   ECG: SR, rate 79, normal axis, prolonged QTc, no acute ischemia. When compared to previous ECG from 12/2011 no significant change.   Reviewed imaging, labs and previous medical records, utilized in MDM  Discussed case with Dr. Lynelle Doctor  Clinical Impression 1. Chest pain. 2. Chronic kidney disease 3. Chronic anemia, unknown etiology        Margie Billet, MD 02/05/12 0130

## 2012-02-04 NOTE — H&P (Signed)
[facility] History and Physical  [name]:[id number] DOB: 1940/04/04    PCP:   Nelwyn Salisbury, MD   Chief Complaint: chest pain.  HPI: [name] is an 71 y.o. 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  He has hx of chest pain and has been seen by Dr Jens Som of cardiology, had numerous work up, but was not elucidative that his chest pain was angina. This time, though, he said it was different, associated with diaphoresis, and more intense.  He was given 3 SL NTG and by the time he arrived at the ER, he was pain free.  Evaluation in the ER showed EKG with SR, no acute ischemic changes.  CXR showed no acute processes.  He has negative initial cardiac markers, and unrmarkable electrolytes with Cr of 1.45 and Hb of 10.5 g/DL.  After [facility] cardiology was consulted by the ER PA, hospitalist was asked to admit him for cardiac r/out.  Rewiew of Systems:  Constitutional: Negative for malaise, fever and chills. No significant weight loss or weight gain Eyes: Negative for eye pain, redness and discharge, diplopia, visual changes, or flashes of light. ENMT: Negative for ear pain, hoarseness, nasal congestion, sinus pressure and sore throat. No headaches; tinnitus, drooling, or problem swallowing. Cardiovascular: Negative for palpitations, diaphoresis, dyspnea and peripheral edema. ; No orthopnea, PND Respiratory: Negative for cough, hemoptysis, wheezing and stridor. No pleuritic chestpain. Gastrointestinal: Negative for nausea, vomiting, diarrhea, constipation, abdominal pain, melena, blood in stool, hematemesis, jaundice and rectal bleeding.    Genitourinary: Negative for frequency, dysuria, incontinence,flank pain and hematuria; Musculoskeletal: Negative for back pain and neck pain.  Negative for swelling and trauma.;  Skin: . Negative for pruritus, rash, abrasions, bruising and skin lesion.; ulcerations Neuro: Negative for headache, lightheadedness and neck stiffness. Negative for weakness, altered level of consciousness , altered mental status, extremity weakness, burning feet, involuntary movement, seizure and syncope.  Psych: negative for anxiety, depression, insomnia, tearfulness, panic attacks, hallucinations, paranoia, suicidal or homicidal ideation    Past Medical History  Diagnosis Date  . PE (pulmonary embolism)     a. >20 years ago;  b. nl V:Q scan 10/2010  . DVT (deep venous thrombosis) 01/2007    bilateral  . Meningioma   . BPH (benign prostatic hyperplasia)   . Personal history of colonic polyps   . Venereal wart   . Depression   . Asthma   . Chickenpox   . Thrombophlebitis   . GERD (gastroesophageal reflux disease)   . Parkinson disease   . Dementia   . CAD (coronary artery disease)     a. 08/2009 s/p CABG x 2 (LIMA->LAD, SVG-OM). b. Adm w/ CP 09/2011 - questionable functionality of LIMA graft but patient declined further catheterization (cath 10/05/11 -- ostial LAD disease, possible flap in prox L subclavian/unable to image LIMA well, atretic SVG-OM)  . Hyperlipidemia   . Stricture and stenosis of esophagus   . Diverticulosis of colon (without mention of hemorrhage)   . Mitral regurgitation     a. 08/2009 s/p MV repair 22mm Edwards ring and oversewing of LA appendage @ time of CABG;  b. cardiac MRI 09/2011 showing some disruption of lateral annuloplasty ring with "perivalvular" MR and some central MR   . Thrombus of left atrial appendage  a. Abnormal dye staining on cath 09/2011 - imaged by CT/MRI/TEE - felt to be surgically isolated from the LA  . Anxiety   . Stroke   . Atrial fibrillation     a. Paroxysmal, not Coumadin candidate 2/2 Parkinson's/gait instability  . Brain tumor     "right side of my brain" (12/22/2011)  . Arthritis     "very  little" (12/22/2011)  . Pulmonary embolism 01/2007  . Aspiration pneumonia 03/2008  . Hiatal hernia     PARKINSONS TREMORS    Past Surgical History  Procedure Date  . Varicose vein surgery     left leg  . Mitral valve repair 08/2009  . Umbilical hernia repair   . Tee without cardioversion 10/06/2011    Procedure: TRANSESOPHAGEAL ECHOCARDIOGRAM (TEE);  Surgeon: Vesta Mixer, MD;  Location: [facility] ENDOSCOPY;  Service: Cardiovascular;  Laterality: N/A;  . Cardiac catheterization   . Esophagogastroduodenoscopy 11/15/2011    Procedure: ESOPHAGOGASTRODUODENOSCOPY (EGD);  Surgeon: Meryl Dare, MD,FACG;  Location: [facility] ENDOSCOPY;  Service: Endoscopy;  Laterality: N/A;  . Tonsillectomy and adenoidectomy   . Mastoidectomy     "right; I'm really not sure what they did to my mastoid" (12/22/2011)  . Inguinal hernia repair     right  . Lumbar disc surgery     "Dr. Jeral Fruit; herniated disc & remove bone spurs"  . Back surgery   . Coronary artery bypass graft 08/2009    CABG X2    Medications:  HOME MEDS: Prior to Admission medications   Medication Sig Start Date End Date Taking? Authorizing Provider  amiodarone (PACERONE) 200 MG tablet Take 200 mg by mouth every morning.  05/12/11  Yes Lewayne Bunting, MD  aspirin EC 325 MG tablet Take 325 mg by mouth daily. 10/22/11  Yes [name] G Barrett, PA  bisacodyl (DULCOLAX) 10 MG suppository Place 10 mg rectally as needed. For constipation   Yes Historical Provider, MD  carbidopa-levodopa (SINEMET) 25-100 MG per tablet Take 1 tablet by mouth 3 (three) times daily.  03/05/11  Yes Nelwyn Salisbury, MD  clonazePAM (KLONOPIN) 1 MG tablet Take 1 tablet (1 mg total) by mouth every 6 (six) hours. 12/16/11  Yes Nelwyn Salisbury, MD  furosemide (LASIX) 20 MG tablet Take 20 mg by mouth daily as needed. For swelling 01/04/12  Yes Tonny Bollman, MD  hydrocortisone cream 1 % Apply 1 application topically 2 (two) times daily as needed. For skin irritation when sweating   Yes  Historical Provider, MD  isosorbide mononitrate (IMDUR) 30 MG 24 hr tablet Take 0.5 tablets (15 mg total) by mouth daily. 01/17/12  Yes Nelwyn Salisbury, MD  levothyroxine (SYNTHROID, LEVOTHROID) 75 MCG tablet Take 75 mcg by mouth every morning.  11/02/11 11/01/12 Yes Nelwyn Salisbury, MD  Multiple Vitamin (MULITIVITAMIN WITH MINERALS) TABS Take 1 tablet by mouth every morning.    Yes Historical Provider, MD  nitroGLYCERIN (NITROSTAT) 0.4 MG SL tablet Place 0.4 mg under the tongue every 5 (five) minutes as needed. For chest pain 10/08/11 10/07/12 Yes Dayna N Dunn, PA  pantoprazole (PROTONIX) 40 MG tablet Take 40 mg by mouth 2 (two) times daily.   Yes Historical Provider, MD  polyethylene glycol (MIRALAX / GLYCOLAX) packet Take 17 g by mouth daily.  10/22/11  Yes [name] G Barrett, PA  pravastatin (PRAVACHOL) 40 MG tablet Take 40 mg by mouth every evening.    Yes Historical Provider, MD  primidone (MYSOLINE) 50 MG tablet Take 25 mg by mouth  2 (two) times daily.   Yes Historical Provider, MD  risperiDONE (RISPERDAL) 1 MG tablet Take 1 mg by mouth at bedtime. 11/08/11  Yes Nelwyn Salisbury, MD  Tamsulosin HCl (FLOMAX) 0.4 MG CAPS Take 0.4 mg by mouth every morning.    Yes Historical Provider, MD  temazepam (RESTORIL) 30 MG capsule Take 30 mg by mouth at bedtime as needed. For sleep 11/11/11  Yes Nelwyn Salisbury, MD  aspirin EC 325 MG tablet Take 650 mg by mouth once.    Historical Provider, MD     Allergies:  Allergies  Allergen Reactions  . Ibuprofen Other (See Comments)    Stomach hurts    Social History:   reports that he quit smoking about 58 years ago. His smoking use included Cigarettes. He has a 10 pack-year smoking history. He has never used smokeless tobacco. He reports that he does not drink alcohol or use illicit drugs.  Family History: Family History  Problem Relation Age of Onset  . Arthritis    . Diabetes    . Stroke Father   . Heart disease Mother      Physical Exam: Filed Vitals:    02/04/12 1626 02/04/12 1730 02/04/12 2030 02/04/12 2045  BP: 106/68 116/80 108/76 112/76  Pulse: 81 79 80 79  Temp: 98 F (36.7 C)     TempSrc: Oral     Resp: 19 14 21 16   SpO2: 98% 97% 95% 96%   Blood pressure 112/76, pulse 79, temperature 98 F (36.7 C), temperature source Oral, resp. rate 16, SpO2 96.00%.  GEN:  Pleasant  patient lying in the stretcher in no acute distress; cooperative with exam. PSYCH:  alert and oriented x4; does not appear anxious or depressed; affect is appropriate. HEENT: Mucous membranes pink and anicteric; PERRLA; EOM intact; no cervical lymphadenopathy nor thyromegaly or carotid bruit; no JVD; There were no stridor. Neck is very supple. Breasts:: Not examined CHEST WALL: No tenderness CHEST: Normal respiration, clear to auscultation bilaterally.  HEART: Regular rate and rhythm.  There are no murmur, rub, or gallops.   BACK: No kyphosis or scoliosis; no CVA tenderness ABDOMEN: soft and non-tender; no masses, no organomegaly, normal abdominal bowel sounds; no pannus; no intertriginous candida. There is no rebound and no distention. Rectal Exam: Not done EXTREMITIES: No bone or joint deformity; age-appropriate arthropathy of the hands and knees; no edema; no ulcerations.  There is no calf tenderness. Genitalia: not examined PULSES: 2+ and symmetric SKIN: Normal hydration no rash or ulceration CNS: Cranial nerves 2-12 grossly intact no focal lateralizing neurologic deficit.  Speech is fluent; uvula elevated with phonation, facial symmetry and tongue midline. DTR are normal bilaterally, cerebella exam is intact, barbinski is negative and strengths are equaled bilaterally.  No sensory loss. He has tremors in both hands.  Labs on Admission:  Basic Metabolic Panel:  Lab 02/04/12 1610  NA 141  K 4.6  CL 105  CO2 24  GLUCOSE 114*  BUN 18  CREATININE 1.45*  CALCIUM 9.5  MG --  PHOS --   Liver Function Tests:  Lab 02/04/12 1915  AST 15  ALT <5  ALKPHOS  67  BILITOT 0.3  PROT 6.9  ALBUMIN 3.8   No results found for this basename: LIPASE:5,AMYLASE:5 in the last 168 hours No results found for this basename: AMMONIA:5 in the last 168 hours CBC:  Lab 02/04/12 1915  WBC 8.1  NEUTROABS 6.3  HGB 10.5*  HCT 34.3*  MCV 78.3  PLT  200   Cardiac Enzymes:  Lab 02/04/12 1916  CKTOTAL --  CKMB --  CKMBINDEX --  TROPONINI <0.30    CBG: No results found for this basename: GLUCAP:5 in the last 168 hours   Radiological Exams on Admission: Dg Chest 2 View  02/04/2012  *RADIOLOGY REPORT*  Clinical Data: Left chest pain, history smoking, atrial fibrillation, coronary artery disease post CABG, stroke, pulmonary embolism  CHEST - 2 VIEW  Comparison: 12/29/2011 Correlation:  CT chest 07/10/2028  Findings: Normal heart size post CABG and MVR. Tortuous aorta. Pulmonary vascularity normal. Chronic enlargement of right hilum unchanged, appears due to enlarged central pulmonary artery on the prior CT chest exam. Chronic atelectasis versus scarring right base. Lungs otherwise clear. No pleural effusion or pneumothorax.  IMPRESSION: No acute abnormalities. Chronic right basilar atelectasis versus scarring. Enlarged central pulmonary arteries.   Original Report Authenticated By: Ulyses Southward, M.D.     EKG: Independently reviewed. NSR no acute ST T changes.   Assessment/Plan Present on Admission:  . PARKINSON'S DISEASE . Chest pain, mid sternal . HYPERTENSION . HYPERLIPIDEMIA . Dementia . Atrial fibrillation . Adult failure to thrive   PLAN:  I spoke with his wife on the phone and updated her on my plan.  The plan is to admit him for cardiac r/out.  I am concerned about unstable angina only because of the history that this pain is a little different than his chronic chest pain and that he indeed has severe CAD. However, his work up didn't show any EKG changes, and he is pain free now, so I wouldn't be surprised if he ended up r/out completely.  I will,  however, start him on IV Heparin tonight, pending his r/out.  He also has hx of PE, Hx of DVT, PAF, and a known left atrial thrombus, so he would benefit getting longterm anticoagulation.  Record indicated that he is not on anticoagulation chronically is because of gait instability with his parkinson's.  I will therefore defer to his cardiologist, PCP, and the day team.  I have continued his meds.  Dr Ludwig Clarks note indicated that his nitroglycerin has not increased because of his borderline BP in the past. For his parkinson's disease, I will continue his medications as well.  He is stable, full code, and will be admitted to [facility] service.  Thank you for allowing [place] to partake in the care of your nice patient.  Other plans as per orders.  Code Status: FULL Wayne Lightning, MD. [facility] Pager [phone] 7pm to 7am.  02/04/2012, 10:40 PM

## 2012-02-04 NOTE — Progress Notes (Signed)
ANTICOAGULATION CONSULT NOTE - Initial Consult  Pharmacy Consult for heparin Indication: chest pain/ACS  Allergies  Allergen Reactions  . Ibuprofen Other (See Comments)    Stomach hurts    Patient Measurements: Height: 6\' 3"  (190.5 cm) Weight: 219 lb 3.2 oz (99.428 kg) IBW/kg (Calculated) : 84.5  Heparin Dosing Weight: 99 kg   Vital Signs: Temp: 97.4 F (36.3 C) (12/13 2300) Temp src: Oral (12/13 2300) BP: 106/68 mmHg (12/13 2300) Pulse Rate: 75  (12/13 2300)  Labs:  Basename 02/04/12 1916 02/04/12 1915  HGB -- 10.5*  HCT -- 34.3*  PLT -- 200  APTT -- --  LABPROT -- --  INR -- --  HEPARINUNFRC -- --  CREATININE -- 1.45*  CKTOTAL -- --  CKMB -- --  TROPONINI <0.30 --    Estimated Creatinine Clearance: 56.7 ml/min (by C-G formula based on Cr of 1.45).   Medical History: Past Medical History  Diagnosis Date  . PE (pulmonary embolism)     a. >20 years ago;  b. nl V:Q scan 10/2010  . DVT (deep venous thrombosis) 01/2007    bilateral  . Meningioma   . BPH (benign prostatic hyperplasia)   . Personal history of colonic polyps   . Venereal wart   . Depression   . Asthma   . Chickenpox   . Thrombophlebitis   . GERD (gastroesophageal reflux disease)   . Parkinson disease   . Dementia   . CAD (coronary artery disease)     a. 08/2009 s/p CABG x 2 (LIMA->LAD, SVG-OM). b. Adm w/ CP 09/2011 - questionable functionality of LIMA graft but patient declined further catheterization (cath 10/05/11 -- ostial LAD disease, possible flap in prox L subclavian/unable to image LIMA well, atretic SVG-OM)  . Hyperlipidemia   . Stricture and stenosis of esophagus   . Diverticulosis of colon (without mention of hemorrhage)   . Mitral regurgitation     a. 08/2009 s/p MV repair 22mm Edwards ring and oversewing of LA appendage @ time of CABG;  b. cardiac MRI 09/2011 showing some disruption of lateral annuloplasty ring with "perivalvular" MR and some central MR   . Thrombus of left atrial  appendage     a. Abnormal dye staining on cath 09/2011 - imaged by CT/MRI/TEE - felt to be surgically isolated from the LA  . Anxiety   . Stroke   . Atrial fibrillation     a. Paroxysmal, not Coumadin candidate 2/2 Parkinson's/gait instability  . Brain tumor     "right side of my brain" (12/22/2011)  . Arthritis     "very little" (12/22/2011)  . Pulmonary embolism 01/2007  . Aspiration pneumonia 03/2008  . Hiatal hernia     PARKINSONS TREMORS    Medications:   Scheduled:    . amiodarone  200 mg Oral q morning - 10a  . carbidopa-levodopa  1 tablet Oral TID  . clonazePAM  1 mg Oral Q6H  . isosorbide mononitrate  15 mg Oral Daily  . multivitamin with minerals  1 tablet Oral q morning - 10a  . pantoprazole  40 mg Oral BID  . polyethylene glycol  17 g Oral Daily  . primidone  25 mg Oral BID  . risperiDONE  1 mg Oral QHS  . simvastatin  20 mg Oral q1800  . sodium chloride  3 mL Intravenous Q12H  . Tamsulosin HCl  0.4 mg Oral q morning - 10a    Assessment: 71 yo male admitted with chest pain. Patient with  h/o PE, DVT, PAF, and L atrial thrombus not on anticoagulation due to Parkinson's gait instability. Pharmacy to manage heparin.  Goal of Therapy:  Heparin level 0.3-0.7 units/ml Monitor platelets by anticoagulation protocol: Yes   Plan:  1. Heparin IV bolus of 4000 units x 1, then IV infusion of 1350 units/hr.  2. Heparin level in 8 hours.  3. Daily CBC, heparin level.  Emeline Gins 02/04/2012,11:11 PM

## 2012-02-04 NOTE — ED Notes (Addendum)
Per EMS- pt reported a syncopal episode while tryingto have a bowel movement today. Pt then had chest pain after episode. Pt has hx of dementia. Pt took 3 nitro and 324 of asprin prior to EMS. Pt has been pain free with EMS.

## 2012-02-04 NOTE — ED Notes (Signed)
[name] pt wife [phone] home phone

## 2012-02-04 NOTE — Telephone Encounter (Signed)
plz return call to [facility]  [phone]  2nd episode of severe chest pain this week, 10 out of 10 chest pain, unrelieved after 3 nitros.   EMS called. Joyce Gross wanted this documented

## 2012-02-05 ENCOUNTER — Encounter (HOSPITAL_COMMUNITY): Payer: Self-pay | Admitting: Cardiology

## 2012-02-05 DIAGNOSIS — I10 Essential (primary) hypertension: Secondary | ICD-10-CM

## 2012-02-05 DIAGNOSIS — K224 Dyskinesia of esophagus: Secondary | ICD-10-CM

## 2012-02-05 LAB — CBC
Hemoglobin: 9.6 g/dL — ABNORMAL LOW (ref 13.0–17.0)
MCV: 77.9 fL — ABNORMAL LOW (ref 78.0–100.0)
Platelets: 188 10*3/uL (ref 150–400)
RBC: 4.08 MIL/uL — ABNORMAL LOW (ref 4.22–5.81)
WBC: 5.9 10*3/uL (ref 4.0–10.5)

## 2012-02-05 LAB — PROTIME-INR: INR: 1.28 (ref 0.00–1.49)

## 2012-02-05 MED ORDER — ASPIRIN 325 MG PO TABS
325.0000 mg | ORAL_TABLET | Freq: Every day | ORAL | Status: DC
  Administered 2012-02-05 – 2012-02-06 (×2): 325 mg via ORAL
  Filled 2012-02-05 (×3): qty 1

## 2012-02-05 MED ORDER — AMLODIPINE BESYLATE 2.5 MG PO TABS
2.5000 mg | ORAL_TABLET | Freq: Every day | ORAL | Status: DC
  Administered 2012-02-05 – 2012-02-06 (×2): 2.5 mg via ORAL
  Filled 2012-02-05 (×2): qty 1

## 2012-02-05 NOTE — Consult Note (Signed)
HPI: a 71 year old male for evaluation of chest pain. Patient with past medical history of coronary artery disease and atrial fibrillation. On July 12 of 2011 the patient underwent two-vessel coronary artery bypass grafting (left internal mammary artery to LAD, saphenous vein graft to obtuse marginal) and MV repair with a 22-mm Edwards ring and oversewing of left atrial appendage. Patient also with orthostatic hypotension. Carotid Dopplers in April of 2013 showed 0-39% bilateral stenosis and followup recommended in one year. Patient admitted in August of 2013 with chest pain. Patient ruled out for myocardial infarction with serial enzymes. Cardiac catheterization on 10/05/2011 showed high-grade LAD disease but no significant disease in the RCA or LCX, atretic saphenous vein graft to OM, possible flap in the proximal left subclavian and the LIMA was not visualized. There was concern for aortic dissection. A CTA however showed no evidence of this. TEE showed normal LV function, a thrombosed left atrial appendage, prior mitral valve repair with mild to moderate mitral regurgitation. Cardiac MRI showed a large left atrial appendage filled with thrombus. Ejection fraction was 68%. Moderate left atrial enlargement. Status post mitral valve repair with annuloplasty ring with some disruption of lateral ring with perivalvular MR. Patient readmitted with chest pain and had repeat cardiac catheterization as at time of previous cath LIMA was not well visualized. There was an eccentric appearing lesion in the ostium of the LAD but not obstructive. IVUS showed minimal plaque and mild calcification of the proximal LAD and no significant stenosis.The LIMA was atretic. Medical therapy recommended. VQ scan in September 2013 very low probability. EGD in September of 2013 showed a hiatal hernia. If chest pain persisted esophageal manometry was recommended. Patient has continued to have intermittent chest pain. Yesterday prior to  taking a shower he developed substernal chest pain described as a tightness. It increased with inspiration. No radiation. There was shortness of breath and diaphoresis but no nausea. The pain lasted approximately 45 minutes to one hour and then resolved. His pain does improve with nitroglycerin. Note he has limited mobility because of his Parkinson's. He has some dyspnea on exertion but no orthopnea or PND. He does not have exertional chest pain. No recent syncope. Because of his chest pain we were asked to evaluate.  Medications Prior to Admission  Medication Sig Dispense Refill  . amiodarone (PACERONE) 200 MG tablet Take 200 mg by mouth every morning.       Marland Kitchen aspirin EC 325 MG tablet Take 325 mg by mouth daily.      . bisacodyl (DULCOLAX) 10 MG suppository Place 10 mg rectally as needed. For constipation      . carbidopa-levodopa (SINEMET) 25-100 MG per tablet Take 1 tablet by mouth 3 (three) times daily.       . clonazePAM (KLONOPIN) 1 MG tablet Take 1 tablet (1 mg total) by mouth every 6 (six) hours.  120 tablet  5  . furosemide (LASIX) 20 MG tablet Take 20 mg by mouth daily as needed. For swelling      . hydrocortisone cream 1 % Apply 1 application topically 2 (two) times daily as needed. For skin irritation when sweating      . isosorbide mononitrate (IMDUR) 30 MG 24 hr tablet Take 0.5 tablets (15 mg total) by mouth daily.  30 tablet  6  . levothyroxine (SYNTHROID, LEVOTHROID) 75 MCG tablet Take 75 mcg by mouth every morning.       . Multiple Vitamin (MULITIVITAMIN WITH MINERALS) TABS Take 1 tablet by mouth every  morning.       . nitroGLYCERIN (NITROSTAT) 0.4 MG SL tablet Place 0.4 mg under the tongue every 5 (five) minutes as needed. For chest pain      . pantoprazole (PROTONIX) 40 MG tablet Take 40 mg by mouth 2 (two) times daily.      . polyethylene glycol (MIRALAX / GLYCOLAX) packet Take 17 g by mouth daily.       . pravastatin (PRAVACHOL) 40 MG tablet Take 40 mg by mouth every evening.        . primidone (MYSOLINE) 50 MG tablet Take 25 mg by mouth 2 (two) times daily.      . risperiDONE (RISPERDAL) 1 MG tablet Take 1 mg by mouth at bedtime.      . Tamsulosin HCl (FLOMAX) 0.4 MG CAPS Take 0.4 mg by mouth every morning.       . temazepam (RESTORIL) 30 MG capsule Take 30 mg by mouth at bedtime as needed. For sleep      . aspirin EC 325 MG tablet Take 650 mg by mouth once.        Allergies  Allergen Reactions  . Ibuprofen Other (See Comments)    Stomach hurts    Past Medical History  Diagnosis Date  . PE (pulmonary embolism)     a. >20 years ago;  b. nl V:Q scan 10/2010  . DVT (deep venous thrombosis) 01/2007    bilateral  . Meningioma   . BPH (benign prostatic hyperplasia)   . Personal history of colonic polyps   . Venereal wart   . Depression   . Asthma   . Chickenpox   . Thrombophlebitis   . GERD (gastroesophageal reflux disease)   . Parkinson disease   . Dementia   . CAD (coronary artery disease)     a. 08/2009 s/p CABG x 2 (LIMA->LAD, SVG-OM). b. Adm w/ CP 09/2011 - questionable functionality of LIMA graft but patient declined further catheterization (cath 10/05/11 -- ostial LAD disease, possible flap in prox L subclavian/unable to image LIMA well, atretic SVG-OM)  . Hyperlipidemia   . Stricture and stenosis of esophagus   . Diverticulosis of colon (without mention of hemorrhage)   . Mitral regurgitation     a. 08/2009 s/p MV repair 22mm Edwards ring and oversewing of LA appendage @ time of CABG;  b. cardiac MRI 09/2011 showing some disruption of lateral annuloplasty ring with "perivalvular" MR and some central MR   . Thrombus of left atrial appendage     a. Abnormal dye staining on cath 09/2011 - imaged by CT/MRI/TEE - felt to be surgically isolated from the LA  . Anxiety   . Stroke   . Atrial fibrillation     a. Paroxysmal, not Coumadin candidate 2/2 Parkinson's/gait instability  . Brain tumor     "right side of my brain" (12/22/2011)  . Arthritis     "very  little" (12/22/2011)  . Pulmonary embolism 01/2007  . Aspiration pneumonia 03/2008  . Hiatal hernia     PARKINSONS TREMORS    Past Surgical History  Procedure Date  . Varicose vein surgery     left leg  . Mitral valve repair 08/2009  . Umbilical hernia repair   . Tee without cardioversion 10/06/2011    Procedure: TRANSESOPHAGEAL ECHOCARDIOGRAM (TEE);  Surgeon: Vesta Mixer, MD;  Location: [facility] [place] ENDOSCOPY;  Service: Cardiovascular;  Laterality: N/A;  . Esophagogastroduodenoscopy 11/15/2011    Procedure: ESOPHAGOGASTRODUODENOSCOPY (EGD);  Surgeon: Meryl Dare, MD,FACG;  Location:  [facility] ENDOSCOPY;  Service: Endoscopy;  Laterality: N/A;  . Tonsillectomy and adenoidectomy   . Mastoidectomy     "right; I'm really not sure what they did to my mastoid" (12/22/2011)  . Inguinal hernia repair     right  . Lumbar disc surgery     "Dr. Jeral Fruit; herniated disc & remove bone spurs"  . Back surgery   . Coronary artery bypass graft 08/2009    CABG X2    History   Social History  . Marital Status: Married    Spouse Name: N/A    Number of Children: 2  . Years of Education: N/A   Occupational History  .      Retired   Social History Main Topics  . Smoking status: Former Smoker -- 1.0 packs/day for 10 years    Types: Cigarettes    Quit date: 02/22/1953  . Smokeless tobacco: Never Used  . Alcohol Use: No  . Drug Use: No  . Sexually Active: No   Other Topics Concern  . 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 Arthritis    . Diabetes    . Stroke Father   . Heart disease Mother     ROS:  no fevers or chills, productive cough, hemoptysis, dysphasia, odynophagia, melena, hematochezia, dysuria, hematuria, rash, seizure activity, orthopnea, PND, pedal edema, claudication.  Remaining systems are negative.  Physical Exam:   Blood pressure 109/69, pulse 72, temperature 98 F (36.7 C), temperature source Oral, resp. rate 16, height 6\' 3"  (1.905 m), weight 219 lb 3.2 oz (99.428 kg), SpO2 96.00%.  General:  Well developed/well nourished in NAD Skin warm/dry Patient not depressed, flat affect related to Parkinson's No peripheral clubbing Back-normal HEENT-normal/normal eyelids Neck supple/normal carotid upstroke bilaterally; no bruits; no JVD; no thyromegaly chest - CTA/ normal expansion CV - RRR/normal S1 and S2; no rubs or gallops;  PMI nondisplaced, 2/6 systolic murmur left sternal border Abdomen -NT/ND, no HSM, no mass, + bowel sounds, no bruit 2+ femoral pulses, no bruits Ext-trace edema, no chords, 2+ DP Neuro-consistent with Parkinson's  ECG sinus rhythm with first degree AV block, no ST changes, prolonged QT interval.  Results for orders placed during the hospital encounter of 02/04/12 (from the past 48 hour(s))  CBC WITH DIFFERENTIAL     Status: Abnormal   Collection Time   02/04/12  7:15 PM      Component Value Range Comment   WBC 8.1  4.0 - 10.5 K/uL    RBC 4.38  4.22 - 5.81 MIL/uL    Hemoglobin 10.5 (*) 13.0 - 17.0 g/dL    HCT 13.0 (*) 86.5 - 52.0 %    MCV 78.3  78.0 - 100.0 fL    MCH 24.0 (*) 26.0 - 34.0 pg    MCHC 30.6  30.0 - 36.0 g/dL    RDW 78.4 (*) 69.6 - 15.5 %    Platelets 200  150 - 400 K/uL    Neutrophils Relative 78 (*) 43 - 77 %    Neutro Abs 6.3  1.7 - 7.7 K/uL    Lymphocytes Relative 14  12 - 46 %    Lymphs Abs 1.2  0.7 - 4.0 K/uL    Monocytes Relative 6  3 - 12 %    Monocytes Absolute 0.5  0.1 - 1.0 K/uL    Eosinophils Relative 1  0 - 5 %    Eosinophils Absolute 0.1  0.0 - 0.7 K/uL    Basophils Relative 0  0 - 1 %    Basophils Absolute 0.0  0.0 - 0.1 K/uL   COMPREHENSIVE METABOLIC PANEL     Status: Abnormal   Collection Time   02/04/12  7:15 PM      Component Value Range Comment   Sodium 141  135 - 145 mEq/L     Potassium 4.6  3.5 - 5.1 mEq/L    Chloride 105  96 - 112 mEq/L    CO2 24  19 - 32 mEq/L    Glucose, Bld 114 (*) 70 - 99 mg/dL    BUN 18  6 - 23 mg/dL    Creatinine, Ser 1.61 (*) 0.50 - 1.35 mg/dL    Calcium 9.5  8.4 - 09.6 mg/dL    Total Protein 6.9  6.0 - 8.3 g/dL    Albumin 3.8  3.5 - 5.2 g/dL    AST 15  0 - 37 U/L    ALT <5  0 - 53 U/L    Alkaline Phosphatase 67  39 - 117 U/L    Total Bilirubin 0.3  0.3 - 1.2 mg/dL    GFR calc non Af Amer 47 (*) >90 mL/min    GFR calc Af Amer 55 (*) >90 mL/min   TROPONIN I     Status: Normal   Collection Time   02/04/12  7:16 PM      Component Value Range Comment   Troponin I <0.30  <0.30 ng/mL   PRO B NATRIURETIC PEPTIDE     Status: Abnormal   Collection Time   02/04/12  7:16 PM      Component Value Range Comment   Pro B Natriuretic peptide (BNP) 320.1 (*) 0 - 125 pg/mL   TROPONIN I     Status: Normal   Collection Time   02/04/12 11:30 PM      Component Value Range Comment   Troponin I <0.30  <0.30 ng/mL   PROTIME-INR     Status: Abnormal   Collection Time   02/04/12 11:30 PM      Component Value Range Comment   Prothrombin Time 15.7 (*) 11.6 - 15.2 seconds    INR 1.28  0.00 - 1.49   TROPONIN I     Status: Normal   Collection Time   02/05/12  4:56 AM      Component Value Range Comment   Troponin I <0.30  <0.30 ng/mL   HEPARIN LEVEL (UNFRACTIONATED)     Status: Normal   Collection Time   02/05/12  8:11 AM      Component Value Range Comment   Heparin Unfractionated 0.46  0.30 - 0.70 IU/mL   CBC     Status: Abnormal   Collection Time   02/05/12  8:11 AM      Component Value Range Comment   WBC 5.9  4.0 - 10.5 K/uL    RBC 4.08 (*) 4.22 - 5.81 MIL/uL    Hemoglobin 9.6 (*) 13.0 - 17.0 g/dL    HCT 04.5 (*) 40.9 - 52.0 %    MCV 77.9 (*) 78.0 - 100.0 fL    MCH 23.5 (*) 26.0 - 34.0 pg    MCHC 30.2  30.0 - 36.0 g/dL    RDW 81.1 (*)  11.5 - 15.5 %    Platelets 188  150 - 400 K/uL     Dg Chest 2 View  02/04/2012  *RADIOLOGY  REPORT*  Clinical Data: Left chest pain, history smoking, atrial fibrillation, coronary artery disease post CABG, stroke, pulmonary embolism  CHEST - 2 VIEW  Comparison: 12/29/2011 Correlation:  CT chest 07/10/2028  Findings: Normal heart size post CABG and MVR. Tortuous aorta. Pulmonary vascularity normal. Chronic enlargement of right hilum unchanged, appears due to enlarged central pulmonary artery on the prior CT chest exam. Chronic atelectasis versus scarring right base. Lungs otherwise clear. No pleural effusion or pneumothorax.  IMPRESSION: No acute abnormalities. Chronic right basilar atelectasis versus scarring. Enlarged central pulmonary arteries.   Original Report Authenticated By: Ulyses Southward, M.D.     Assessment/Plan 1 chest pain - Patient continues to have chest pain. Etiology unclear. He has had an extensive workup including chest CT that was negative for dissection, echocardiogram that showed normal LV function, and cardiac catheterization that did not demonstrate significant coronary disease; VQ scan was negative; EGD showed hiatal hernia. There was noted that esophageal manometry should be performed if symptoms persisted. Continue Protonix. Agree with continuing nitrates and adding low-dose Norvasc for possible esophageal spasm. These medications would also treat coronary vasospasm. Note despite 45 minutes to one hour chest pain his electrocardiogram shows no ST changes and his enzymes negative. I do not think further cardiac ischemia evaluation is indicated. We need to watch his blood pressure closely with the addition of a calcium blocker as he has had problems with orthostatic hypotension previously. I would be happy to see him back as an outpatient 6-8 weeks after discharge. I would recommend GI evaluation with Dr. Jarold Motto following discharge. 2 Parkinson's-continue preadmission medications. 3 history of paroxysmal atrial fibrillation-continue amiodarone. Given history of orthostatic  hypotension, Parkinson's and previous falls I have felt the risk of Coumadin outweighs the benefit. Would resume aspirin. Note he has also had oversewing of his left atrial appendage. 4 hyperlipidemia-continue statin. 5 coronary artery disease resume aspirin and continue statin. 6 mitral valve repair-continue SBE prophylaxis. Please call with questions. Olga Millers MD 02/05/2012, 11:34 AM

## 2012-02-05 NOTE — Progress Notes (Signed)
ANTICOAGULATION CONSULT NOTE - Follow up Consult  Pharmacy Consult for heparin Indication: chest pain/ACS  Allergies  Allergen Reactions  . Ibuprofen Other (See Comments)    Stomach hurts    Patient Measurements: Height: 6\' 3"  (190.5 cm) Weight: 219 lb 3.2 oz (99.428 kg) IBW/kg (Calculated) : 84.5  Heparin Dosing Weight: 99 kg   Vital Signs: Temp: 98 F (36.7 C) (12/14 0500) Temp src: Oral (12/13 2300) BP: 101/64 mmHg (12/14 0500) Pulse Rate: 65  (12/14 0500)  Labs:  Basename 02/05/12 0811 02/05/12 0456 02/04/12 2330 02/04/12 1916 02/04/12 1915  HGB 9.6* -- -- -- 10.5*  HCT 31.8* -- -- -- 34.3*  PLT 188 -- -- -- 200  APTT -- -- -- -- --  LABPROT -- -- 15.7* -- --  INR -- -- 1.28 -- --  HEPARINUNFRC 0.46 -- -- -- --  CREATININE -- -- -- -- 1.45*  CKTOTAL -- -- -- -- --  CKMB -- -- -- -- --  TROPONINI -- <0.30 <0.30 <0.30 --    Estimated Creatinine Clearance: 56.7 ml/min (by C-G formula based on Cr of 1.45).   Medical History: Past Medical History  Diagnosis Date  . PE (pulmonary embolism)     a. >20 years ago;  b. nl V:Q scan 10/2010  . DVT (deep venous thrombosis) 01/2007    bilateral  . Meningioma   . BPH (benign prostatic hyperplasia)   . Personal history of colonic polyps   . Venereal wart   . Depression   . Asthma   . Chickenpox   . Thrombophlebitis   . GERD (gastroesophageal reflux disease)   . Parkinson disease   . Dementia   . CAD (coronary artery disease)     a. 08/2009 s/p CABG x 2 (LIMA->LAD, SVG-OM). b. Adm w/ CP 09/2011 - questionable functionality of LIMA graft but patient declined further catheterization (cath 10/05/11 -- ostial LAD disease, possible flap in prox L subclavian/unable to image LIMA well, atretic SVG-OM)  . Hyperlipidemia   . Stricture and stenosis of esophagus   . Diverticulosis of colon (without mention of hemorrhage)   . Mitral regurgitation     a. 08/2009 s/p MV repair 22mm Edwards ring and oversewing of LA appendage @ time  of CABG;  b. cardiac MRI 09/2011 showing some disruption of lateral annuloplasty ring with "perivalvular" MR and some central MR   . Thrombus of left atrial appendage     a. Abnormal dye staining on cath 09/2011 - imaged by CT/MRI/TEE - felt to be surgically isolated from the LA  . Anxiety   . Stroke   . Atrial fibrillation     a. Paroxysmal, not Coumadin candidate 2/2 Parkinson's/gait instability  . Brain tumor     "right side of my brain" (12/22/2011)  . Arthritis     "very little" (12/22/2011)  . Pulmonary embolism 01/2007  . Aspiration pneumonia 03/2008  . Hiatal hernia     PARKINSONS TREMORS    Medications:   Scheduled:     . amiodarone  200 mg Oral q morning - 10a  . carbidopa-levodopa  1 tablet Oral TID  . clonazePAM  1 mg Oral Q6H  . [COMPLETED] heparin  4,000 Units Intravenous Once  . isosorbide mononitrate  15 mg Oral Daily  . multivitamin with minerals  1 tablet Oral q morning - 10a  . pantoprazole  40 mg Oral BID  . polyethylene glycol  17 g Oral Daily  . primidone  25 mg Oral BID  .  risperiDONE  1 mg Oral QHS  . simvastatin  20 mg Oral q1800  . sodium chloride  3 mL Intravenous Q12H  . Tamsulosin HCl  0.4 mg Oral q morning - 10a    Assessment: 71 yo male admitted with chest pain. Patient with h/o PE, DVT, PAF, and L atrial thrombus not on anticoagulation due to Parkinson's gait instability. On heparin for cardiac r/o for chest pain/ACS. Heparin level therapeutic this am at 0.46. No bleeding noted, CBC stable.  Goal of Therapy:  Heparin level 0.3-0.7 units/ml Monitor platelets by anticoagulation protocol: Yes   Plan:  1. Continue heparin infusion at 1350 units/hr.  2. Repeat heparin level in 8 hours.  3. Daily CBC, heparin level.   Doris Cheadle, PharmD Clinical Pharmacist Pager: [phone] Phone: [phone] 02/05/2012 9:29 AM

## 2012-02-05 NOTE — ED Provider Notes (Signed)
I saw and evaluated the patient, reviewed the resident's note and I agree with the findings and plan.  Pt's previous record reviewed.  Known history of CAD but no lesions that have been amenable to intervention.  Cardiology was consulted.  Plan for admission, serial enzymes, rule out MI.  Celene Kras, MD 02/05/12 [phone]

## 2012-02-05 NOTE — Progress Notes (Signed)
[facility] PROGRESS NOTE  [name] ZOX:[id number] DOB: 1940-11-13 DOA: 02/04/2012 PCP: Nelwyn Salisbury, MD  Assessment/Plan: 1. Chest pain- heparin gtt until recommendations by cardiology who was consulted in the ER, CE negative so far.  EKG with no ST changes- I suspect patient may be having esophageal spasms- has dysmotility related to parkinson's already.  Spasms and angina are both relieved with nitro.  Will add a very low dose of CCB as patient's BP is on the low side already. Had EGD done during last admission 9/13- recommend esophageal manometry and barium esophagram if CP continued, will make recommendations for patient to follow with Dr. Jarold Motto 2. Parkinson's disease- on home meds 3. HTN- on the low side 4. HLD- stable 5. Dementia 6. A fib- no coumadin secondary to falls per cardiology  Code Status: full Family Communication:  Disposition Plan: home soon   Consultants:  cardiology  Procedures:    Antibiotics:    HPI/Subjective: No further episodes- episodes lasted 45 minutes  Objective: Filed Vitals:   02/04/12 2045 02/04/12 2200 02/04/12 2300 02/05/12 0500  BP: 112/76 108/70 106/68 101/64  Pulse: 79 75 75 65  Temp:   97.4 F (36.3 C) 98 F (36.7 C)  TempSrc:   Oral   Resp: 16 12 15 16   Height:   6\' 3"  (1.905 m)   Weight:   99.428 kg (219 lb 3.2 oz)   SpO2: 96% 95% 97% 96%    Intake/Output Summary (Last 24 hours) at 02/05/12 8119 Last data filed at 02/05/12 0500  Gross per 24 hour  Intake      0 ml  Output      0 ml  Net      0 ml   Filed Weights   02/04/12 2300  Weight: 99.428 kg (219 lb 3.2 oz)    Exam:   General:  Flat affect, pleasant/cooperative  Cardiovascular: rrr  Respiratory: clear ant  Abdomen: +BS, soft, NT/ND  Data Reviewed: Basic Metabolic Panel:  Lab 02/04/12 1478  NA 141  K 4.6  CL 105  CO2 24  GLUCOSE 114*  BUN 18  CREATININE 1.45*  CALCIUM 9.5  MG --  PHOS --   Liver Function Tests:  Lab  02/04/12 1915  AST 15  ALT <5  ALKPHOS 67  BILITOT 0.3  PROT 6.9  ALBUMIN 3.8   No results found for this basename: LIPASE:5,AMYLASE:5 in the last 168 hours No results found for this basename: AMMONIA:5 in the last 168 hours CBC:  Lab 02/05/12 0811 02/04/12 1915  WBC 5.9 8.1  NEUTROABS -- 6.3  HGB 9.6* 10.5*  HCT 31.8* 34.3*  MCV 77.9* 78.3  PLT 188 200   Cardiac Enzymes:  Lab 02/05/12 0456 02/04/12 2330 02/04/12 1916  CKTOTAL -- -- --  CKMB -- -- --  CKMBINDEX -- -- --  TROPONINI <0.30 <0.30 <0.30   BNP (last 3 results)  Basename 02/04/12 1916 10/05/11 0508 10/04/11 1647  PROBNP 320.1* 534.4* 656.8*   CBG: No results found for this basename: GLUCAP:5 in the last 168 hours  No results found for this or any previous visit (from the past 240 hour(s)).   Studies: Dg Chest 2 View  02/04/2012  *RADIOLOGY REPORT*  Clinical Data: Left chest pain, history smoking, atrial fibrillation, coronary artery disease post CABG, stroke, pulmonary embolism  CHEST - 2 VIEW  Comparison: 12/29/2011 Correlation:  CT chest 07/10/2028  Findings: Normal heart size post CABG and MVR. Tortuous aorta. Pulmonary vascularity normal. Chronic enlargement  of right hilum unchanged, appears due to enlarged central pulmonary artery on the prior CT chest exam. Chronic atelectasis versus scarring right base. Lungs otherwise clear. No pleural effusion or pneumothorax.  IMPRESSION: No acute abnormalities. Chronic right basilar atelectasis versus scarring. Enlarged central pulmonary arteries.   Original Report Authenticated By: Ulyses Southward, M.D.     Scheduled Meds:   . amiodarone  200 mg Oral q morning - 10a  . carbidopa-levodopa  1 tablet Oral TID  . clonazePAM  1 mg Oral Q6H  . isosorbide mononitrate  15 mg Oral Daily  . multivitamin with minerals  1 tablet Oral q morning - 10a  . pantoprazole  40 mg Oral BID  . polyethylene glycol  17 g Oral Daily  . primidone  25 mg Oral BID  . risperiDONE  1 mg Oral  QHS  . simvastatin  20 mg Oral q1800  . sodium chloride  3 mL Intravenous Q12H  . Tamsulosin HCl  0.4 mg Oral q morning - 10a   Continuous Infusions:   . heparin 1,350 Units/hr (02/04/12 2336)    Active Problems:  Dementia  PARKINSON'S DISEASE  HYPERTENSION  Atrial fibrillation  History of mitral valve repair  HYPERLIPIDEMIA  Chest pain, mid sternal  Adult failure to thrive    Time spent: 25 min    [facility], [name]  [facility] Pager [phone]. If 8PM-8AM, please contact night-coverage at [email or web address], password [facility] 02/05/2012, 9:22 AM  LOS: 1 day

## 2012-02-06 DIAGNOSIS — K219 Gastro-esophageal reflux disease without esophagitis: Secondary | ICD-10-CM

## 2012-02-06 DIAGNOSIS — K224 Dyskinesia of esophagus: Secondary | ICD-10-CM

## 2012-02-06 DIAGNOSIS — I200 Unstable angina: Secondary | ICD-10-CM

## 2012-02-06 LAB — CBC
HCT: 31.6 % — ABNORMAL LOW (ref 39.0–52.0)
Hemoglobin: 9.6 g/dL — ABNORMAL LOW (ref 13.0–17.0)
MCHC: 30.4 g/dL (ref 30.0–36.0)
MCV: 78.2 fL (ref 78.0–100.0)

## 2012-02-06 LAB — GLUCOSE, CAPILLARY: Glucose-Capillary: 97 mg/dL (ref 70–99)

## 2012-02-06 MED ORDER — AMLODIPINE BESYLATE 2.5 MG PO TABS: 2.5000 mg | ORAL_TABLET | Freq: Every day | ORAL | Status: DC

## 2012-02-06 NOTE — Discharge Summary (Signed)
Physician Discharge Summary  [name]:[id number] DOB: 22-Jan-1941 DOA: 02/04/2012  PCP: Nelwyn Salisbury, MD  Admit date: 02/04/2012 Discharge date: 02/06/2012  Time spent: 35 minutes  Recommendations for Outpatient Follow-up:  1. Home with resumption of home health- PT/  Discharge Diagnoses:  Active Problems:  Dementia  PARKINSON'S DISEASE  HYPERTENSION  Atrial fibrillation  History of mitral valve repair  HYPERLIPIDEMIA  Chest pain, mid sternal  Adult failure to thrive  Esophageal dysmotility   Discharge Condition: improved  Diet recommendation: resume home diet- DYS  Filed Weights   02/04/12 2300 02/06/12 0500  Weight: 99.428 kg (219 lb 3.2 oz) 100.472 kg (221 lb 8 oz)    History of present illness:  [name] is an 71 y.o. 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 He has hx of chest pain and has been seen by Dr Jens Som of cardiology, had numerous work up, but was not elucidative that his chest pain was angina. This time, though, he said it was different, associated with diaphoresis, and more intense. He was given 3 SL NTG and by the time he arrived at the ER, he was pain free. Evaluation in the ER showed EKG with SR, no acute ischemic changes. CXR showed no acute processes. He has negative initial cardiac markers, and unrmarkable electrolytes with Cr of 1.45 and Hb of 10.5 g/DL. After [facility] cardiology was consulted by the ER PA, hospitalist was asked to admit him for cardiac r/out.   Hospital Course:  1. Chest pain- heparin gtt until recommendations by cardiology who was consulted in the ER, CE negative so far. EKG with no ST changes- I suspect patient may be having esophageal spasms- has dysmotility related to parkinson's already. Spasms and angina are both  relieved with nitro. Will add a very low dose of CCB as patient's BP is on the low side already. Had EGD done during last admission 9/13- recommend esophageal manometry and barium esophagram if CP continued, will make recommendations for patient to follow with Dr. Jarold Motto 2. Parkinson's disease- on home meds- family expressed that they cannot afford medications for this- they are working with a Child psychotherapist to get medication assistance, have an appointment tomm 3. HTN- on the low side 4. HLD- stable 5. Dementia 6. A fib- no coumadin secondary to risk of falls (parkinson's)   Consultations:  cardiology  Discharge Exam: Filed Vitals:   02/05/12 2100 02/06/12 0500 02/06/12 1011 02/06/12 1135  BP: 126/74 92/54 108/72 129/67  Pulse: 77 68 73 87  Temp: 97.9 F (36.6 C) 97.4 F (36.3 C)  98.1 F (36.7 C)  TempSrc:    Oral  Resp: 16 16  20   Height:      Weight:  100.472 kg (221 lb 8 oz)    SpO2: 96% 93%  95%    General: +tremors Cardiovascular: rrr Respiratory: clear anterior  Discharge Instructions      Discharge Orders    Future Appointments: Provider: Department: Dept Phone: Center:   04/26/2012 2:00 PM Lewayne Bunting, MD [facility] [place] [phone] [email or web address]     Future Orders Please Complete By Expires   Increase activity slowly      Discharge instructions      Comments:   Resume home health Resume DYS diet- no cold/no hot foods  Discharge instructions      Comments:   Has sw for med set up community resource planning       Medication List     As of 02/06/2012  5:03 PM    STOP taking these medications         hydrocortisone cream 1 %      TAKE these medications         amiodarone 200 MG tablet   Commonly known as: PACERONE   Take 200 mg by mouth every morning.      amLODipine 2.5 MG tablet   Commonly known as: NORVASC   Take 1 tablet (2.5 mg total) by mouth daily.      aspirin EC 325 MG tablet   Take 325 mg by mouth daily.       bisacodyl 10 MG suppository   Commonly known as: DULCOLAX   Place 10 mg rectally as needed. For constipation      carbidopa-levodopa 25-100 MG per tablet   Commonly known as: SINEMET IR   Take 1 tablet by mouth 3 (three) times daily.      clonazePAM 1 MG tablet   Commonly known as: KLONOPIN   Take 1 tablet (1 mg total) by mouth every 6 (six) hours.      furosemide 20 MG tablet   Commonly known as: LASIX   Take 20 mg by mouth daily as needed. For swelling      isosorbide mononitrate 30 MG 24 hr tablet   Commonly known as: IMDUR   Take 0.5 tablets (15 mg total) by mouth daily.      levothyroxine 75 MCG tablet   Commonly known as: SYNTHROID, LEVOTHROID   Take 75 mcg by mouth every morning.      multivitamin with minerals Tabs   Take 1 tablet by mouth every morning.      nitroGLYCERIN 0.4 MG SL tablet   Commonly known as: NITROSTAT   Place 0.4 mg under the tongue every 5 (five) minutes as needed. For chest pain      pantoprazole 40 MG tablet   Commonly known as: PROTONIX   Take 40 mg by mouth 2 (two) times daily.      polyethylene glycol packet   Commonly known as: MIRALAX / GLYCOLAX   Take 17 g by mouth daily.      pravastatin 40 MG tablet   Commonly known as: PRAVACHOL   Take 40 mg by mouth every evening.      primidone 50 MG tablet   Commonly known as: MYSOLINE   Take 25 mg by mouth 2 (two) times daily.      risperiDONE 1 MG tablet   Commonly known as: RISPERDAL   Take 1 mg by mouth at bedtime.      Tamsulosin HCl 0.4 MG Caps   Commonly known as: FLOMAX   Take 0.4 mg by mouth every morning.      temazepam 30 MG capsule   Commonly known as: RESTORIL   Take 30 mg by mouth at bedtime as needed. For sleep        Follow-up Information    Follow up with FRY,STEPHEN A, MD. In 1 week.   Contact information:   [address] [place] [address] [phone]       Follow up with Sheryn Bison, MD. Schedule an appointment as soon as possible  for a visit in 1 week. (for possible barium exophagram and esophageal manometry)    Contact information:  [address] [place] [address] [phone]       Follow up with [facility]. [facility] Physical Therapy, Social Worker, Occupational Therapy, aide )    Contact information:   [phone]          The results of significant diagnostics from this hospitalization (including imaging, microbiology, ancillary and laboratory) are listed below for reference.    Significant Diagnostic Studies: Dg Chest 2 View  02/04/2012  *RADIOLOGY REPORT*  Clinical Data: Left chest pain, history smoking, atrial fibrillation, coronary artery disease post CABG, stroke, pulmonary embolism  CHEST - 2 VIEW  Comparison: 12/29/2011 Correlation:  CT chest 07/10/2028  Findings: Normal heart size post CABG and MVR. Tortuous aorta. Pulmonary vascularity normal. Chronic enlargement of right hilum unchanged, appears due to enlarged central pulmonary artery on the prior CT chest exam. Chronic atelectasis versus scarring right base. Lungs otherwise clear. No pleural effusion or pneumothorax.  IMPRESSION: No acute abnormalities. Chronic right basilar atelectasis versus scarring. Enlarged central pulmonary arteries.   Original Report Authenticated By: Ulyses Southward, M.D.     Microbiology: No results found for this or any previous visit (from the past 240 hour(s)).   Labs: Basic Metabolic Panel:  Lab 02/04/12 6962  NA 141  K 4.6  CL 105  CO2 24  GLUCOSE 114*  BUN 18  CREATININE 1.45*  CALCIUM 9.5  MG --  PHOS --   Liver Function Tests:  Lab 02/04/12 1915  AST 15  ALT <5  ALKPHOS 67  BILITOT 0.3  PROT 6.9  ALBUMIN 3.8   No results found for this basename: LIPASE:5,AMYLASE:5 in the last 168 hours No results found for this basename: AMMONIA:5 in the last 168 hours CBC:  Lab 02/06/12 0532 02/05/12 0811 02/04/12 1915  WBC 6.4 5.9 8.1  NEUTROABS -- -- 6.3  HGB  9.6* 9.6* 10.5*  HCT 31.6* 31.8* 34.3*  MCV 78.2 77.9* 78.3  PLT 192 188 200   Cardiac Enzymes:  Lab 02/05/12 1100 02/05/12 0456 02/04/12 2330 02/04/12 1916  CKTOTAL -- -- -- --  CKMB -- -- -- --  CKMBINDEX -- -- -- --  TROPONINI <0.30 <0.30 <0.30 <0.30   BNP: BNP (last 3 results)  Basename 02/04/12 1916 10/05/11 0508 10/04/11 1647  PROBNP 320.1* 534.4* 656.8*   CBG:  Lab 02/06/12 0741 02/05/12 2008  GLUCAP 97 105*       Signed:  Alese Furniss  [facility] 02/06/2012, 5:03 PM

## 2012-02-06 NOTE — Progress Notes (Signed)
   CARE MANAGEMENT NOTE 02/06/2012  Patient:  [name], [name]   Account Number:  [id number]  Date Initiated:  02/06/2012  Documentation initiated by:  [facility]  Subjective/Objective Assessment:     Action/Plan:   Anticipated DC Date:  02/06/2012   Anticipated DC Plan:  HOME W HOME HEALTH SERVICES      DC Planning Services  CM consult      [facility] Choice  HOME HEALTH   Choice offered to / List presented to:  C-1 Patient        HH arranged  HH-2 PT  HH-6 SOCIAL WORKER  HH-1 RN      [facility] agency  [facility]   Status of service:  Completed, signed off Medicare Important Message given?   (If response is "NO", the following Medicare IM given date fields will be blank) Date Medicare IM given:   Date Additional Medicare IM given:    Discharge Disposition:  HOME W HOME HEALTH SERVICES  Per UR Regulation:    If discussed at Long Length of Stay Meetings, dates discussed:    Comments:  02/06/2012 1400 Faxed [employer] the [facility] orders for scheduled d/c home today. Isidoro Donning RN CCM Case Mgmt phone [phone]

## 2012-02-07 ENCOUNTER — Other Ambulatory Visit: Payer: Self-pay | Admitting: Family Medicine

## 2012-02-08 ENCOUNTER — Telehealth: Payer: Self-pay | Admitting: Family Medicine

## 2012-02-08 NOTE — Telephone Encounter (Signed)
I spoke with [name] and gave verbal order, okay per Dr. [name].

## 2012-02-08 NOTE — Telephone Encounter (Signed)
Reather Converse, a [place] Social worker called stating that she need a social work order to discuss financial assistance and rehab placement options. Please assist.

## 2012-02-14 ENCOUNTER — Telehealth: Payer: Self-pay | Admitting: Family Medicine

## 2012-02-14 NOTE — Telephone Encounter (Signed)
[name] is requesting verbal order to continue occupational therapy for one time this week and twice a wk for 1 month and once a week for additional  Wk.

## 2012-02-17 ENCOUNTER — Other Ambulatory Visit: Payer: Self-pay | Admitting: Family Medicine

## 2012-02-17 NOTE — Telephone Encounter (Signed)
Ordered called in and left on vm

## 2012-02-17 NOTE — Telephone Encounter (Signed)
Please call this in  

## 2012-02-18 NOTE — Telephone Encounter (Signed)
I called in script 

## 2012-02-18 NOTE — Telephone Encounter (Signed)
Call in #30 with 11 rf 

## 2012-03-02 ENCOUNTER — Telehealth: Payer: Self-pay | Admitting: Cardiology

## 2012-03-02 NOTE — Telephone Encounter (Addendum)
pt having angina attack, has parkinsons, ems with him now , sending to [facility]

## 2012-03-02 NOTE — Telephone Encounter (Signed)
Spoke with pt wife, she states the pt has had a death in the family and the pt has had several angina attacks recently. He has a lot of fluid buildup and will not take his fluid pill. She feels he needs to be evaluated at the hosp. Pt to get pt transported to the ER.

## 2012-03-02 NOTE — Telephone Encounter (Signed)
Pt has had 3 angina attacks and going through it right now 130/80-130/90 for the last 30 min and he has had 1 spray of nitro and b/p has not fallen. Pt is has fluid build up. 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

## 2012-03-02 NOTE — Telephone Encounter (Signed)
Spoke with pt, he reports his cp is gone.he does not want to go to the hosp. His wife states he is very swollen and he refuses to take his fluid pill. Discussed with pt, hr agrees to take his fluid medicine.

## 2012-03-07 ENCOUNTER — Other Ambulatory Visit: Payer: Self-pay | Admitting: Family Medicine

## 2012-03-07 NOTE — Telephone Encounter (Signed)
Call in #30 only. He is seeing [place] tomorrow

## 2012-03-07 NOTE — Telephone Encounter (Signed)
Pt was prescribed amLODipine (NORVASC) 2.5 MG tablet while in hospital. Pt can not get hosptial MD to reply for this request. Pt would like to know if you will refill this med for him. Pt is out and needs ASAP. Pharm Hiram Comber

## 2012-03-08 ENCOUNTER — Emergency Department (HOSPITAL_COMMUNITY)
Admission: EM | Admit: 2012-03-08 | Discharge: 2012-03-08 | Disposition: A | Discharge status: Home / Self Care | Payer: Medicare Other | Attending: Emergency Medicine | Admitting: Emergency Medicine

## 2012-03-08 ENCOUNTER — Encounter (HOSPITAL_COMMUNITY): Payer: Self-pay | Admitting: Emergency Medicine

## 2012-03-08 ENCOUNTER — Emergency Department (HOSPITAL_COMMUNITY): Payer: Medicare Other

## 2012-03-08 ENCOUNTER — Ambulatory Visit: Payer: Medicare Other | Admitting: Family Medicine

## 2012-03-08 DIAGNOSIS — R609 Edema, unspecified: Secondary | ICD-10-CM | POA: Insufficient documentation

## 2012-03-08 DIAGNOSIS — J45909 Unspecified asthma, uncomplicated: Secondary | ICD-10-CM | POA: Insufficient documentation

## 2012-03-08 DIAGNOSIS — R0989 Other specified symptoms and signs involving the circulatory and respiratory systems: Secondary | ICD-10-CM | POA: Insufficient documentation

## 2012-03-08 DIAGNOSIS — Z8739 Personal history of other diseases of the musculoskeletal system and connective tissue: Secondary | ICD-10-CM | POA: Insufficient documentation

## 2012-03-08 DIAGNOSIS — I208 Other forms of angina pectoris: Secondary | ICD-10-CM

## 2012-03-08 DIAGNOSIS — E785 Hyperlipidemia, unspecified: Secondary | ICD-10-CM | POA: Insufficient documentation

## 2012-03-08 DIAGNOSIS — Z86718 Personal history of other venous thrombosis and embolism: Secondary | ICD-10-CM | POA: Insufficient documentation

## 2012-03-08 DIAGNOSIS — Z8719 Personal history of other diseases of the digestive system: Secondary | ICD-10-CM | POA: Insufficient documentation

## 2012-03-08 DIAGNOSIS — Z86711 Personal history of pulmonary embolism: Secondary | ICD-10-CM | POA: Insufficient documentation

## 2012-03-08 DIAGNOSIS — F3289 Other specified depressive episodes: Secondary | ICD-10-CM | POA: Insufficient documentation

## 2012-03-08 DIAGNOSIS — I209 Angina pectoris, unspecified: Secondary | ICD-10-CM | POA: Insufficient documentation

## 2012-03-08 DIAGNOSIS — K219 Gastro-esophageal reflux disease without esophagitis: Secondary | ICD-10-CM | POA: Insufficient documentation

## 2012-03-08 DIAGNOSIS — R0609 Other forms of dyspnea: Secondary | ICD-10-CM | POA: Insufficient documentation

## 2012-03-08 DIAGNOSIS — G20A1 Parkinson's disease without dyskinesia, without mention of fluctuations: Secondary | ICD-10-CM | POA: Insufficient documentation

## 2012-03-08 DIAGNOSIS — Z862 Personal history of diseases of the blood and blood-forming organs and certain disorders involving the immune mechanism: Secondary | ICD-10-CM | POA: Insufficient documentation

## 2012-03-08 DIAGNOSIS — N400 Enlarged prostate without lower urinary tract symptoms: Secondary | ICD-10-CM | POA: Insufficient documentation

## 2012-03-08 DIAGNOSIS — Z8701 Personal history of pneumonia (recurrent): Secondary | ICD-10-CM | POA: Insufficient documentation

## 2012-03-08 DIAGNOSIS — F0390 Unspecified dementia without behavioral disturbance: Secondary | ICD-10-CM | POA: Insufficient documentation

## 2012-03-08 DIAGNOSIS — I2510 Atherosclerotic heart disease of native coronary artery without angina pectoris: Secondary | ICD-10-CM | POA: Insufficient documentation

## 2012-03-08 DIAGNOSIS — G20 Parkinson's disease: Secondary | ICD-10-CM | POA: Insufficient documentation

## 2012-03-08 DIAGNOSIS — Z8673 Personal history of transient ischemic attack (TIA), and cerebral infarction without residual deficits: Secondary | ICD-10-CM | POA: Insufficient documentation

## 2012-03-08 DIAGNOSIS — Z86011 Personal history of benign neoplasm of the brain: Secondary | ICD-10-CM | POA: Insufficient documentation

## 2012-03-08 DIAGNOSIS — Z860100 Personal history of colon polyps, unspecified: Secondary | ICD-10-CM | POA: Insufficient documentation

## 2012-03-08 DIAGNOSIS — F411 Generalized anxiety disorder: Secondary | ICD-10-CM | POA: Insufficient documentation

## 2012-03-08 DIAGNOSIS — Z8619 Personal history of other infectious and parasitic diseases: Secondary | ICD-10-CM | POA: Insufficient documentation

## 2012-03-08 DIAGNOSIS — F329 Major depressive disorder, single episode, unspecified: Secondary | ICD-10-CM | POA: Insufficient documentation

## 2012-03-08 DIAGNOSIS — Z8679 Personal history of other diseases of the circulatory system: Secondary | ICD-10-CM | POA: Insufficient documentation

## 2012-03-08 DIAGNOSIS — R209 Unspecified disturbances of skin sensation: Secondary | ICD-10-CM | POA: Insufficient documentation

## 2012-03-08 DIAGNOSIS — Z87448 Personal history of other diseases of urinary system: Secondary | ICD-10-CM | POA: Insufficient documentation

## 2012-03-08 LAB — URINE MICROSCOPIC-ADD ON

## 2012-03-08 LAB — CBC WITH DIFFERENTIAL/PLATELET
HCT: 31.4 % — ABNORMAL LOW (ref 39.0–52.0)
Hemoglobin: 9.8 g/dL — ABNORMAL LOW (ref 13.0–17.0)
Lymphocytes Relative: 11 % — ABNORMAL LOW (ref 12–46)
MCHC: 31.2 g/dL (ref 30.0–36.0)
Monocytes Absolute: 0.6 10*3/uL (ref 0.1–1.0)
Monocytes Relative: 8 % (ref 3–12)
Neutro Abs: 5.7 10*3/uL (ref 1.7–7.7)
WBC: 7.4 10*3/uL (ref 4.0–10.5)

## 2012-03-08 LAB — URINALYSIS, ROUTINE W REFLEX MICROSCOPIC
Bilirubin Urine: NEGATIVE
Glucose, UA: NEGATIVE mg/dL
Ketones, ur: 15 mg/dL — AB
pH: 6 (ref 5.0–8.0)

## 2012-03-08 LAB — BASIC METABOLIC PANEL
BUN: 18 mg/dL (ref 6–23)
CO2: 22 mEq/L (ref 19–32)
Chloride: 104 mEq/L (ref 96–112)
Creatinine, Ser: 1.26 mg/dL (ref 0.50–1.35)

## 2012-03-08 MED ORDER — AMLODIPINE BESYLATE 2.5 MG PO TABS: 2.5000 mg | ORAL_TABLET | Freq: Every day | ORAL | Status: DC

## 2012-03-08 NOTE — ED Notes (Signed)
Pt states he started to have tightness in his chest this morning around 11am. EMS states wife gave patient 3 Nitros and 1 324 of ASA before calling EMS.  Pt states he is pain free at this moment. Pt states he ate this morning, Oatmeal and apple juice. Pt states he has a history of heartburn. Wife states he has increased swelling in his right ankle, right hand, and eyelids. Wife also states he has a hard time starting to urinate. She also states he has a history of UTI

## 2012-03-08 NOTE — ED Provider Notes (Signed)
History     CSN: [id number]  Arrival date & time 03/08/12  1205   First MD Initiated Contact with Patient 03/08/12 1213      Chief Complaint  Patient presents with  . Chest Pain    (Consider location/radiation/quality/duration/timing/severity/associated sxs/prior treatment) HPI Pt with numerous medical problems including CAD and stable angina reports an episode of midsternal chest pain about an hour prior to arrival. Similar to previous, last was about a week ago. Symptoms were associated with 'numbness all over' and dyspnea. Symptoms were resolved with ASA and NTG given by wife prior to EMS arrival. Wife is also concerned about 'puffiness' around L eye, hand and LE.She denies any weight gain. She ha been giving him Lasix but only a half dose yesterday because he has had difficulty getting to the bathroom due to Parkinson's. He has had numerous admissions for similar symptoms, last cath in Aug 2013 neg for obstructive disease.   Past Medical History  Diagnosis Date  . PE (pulmonary embolism)     a. >20 years ago;  b. nl V:Q scan 10/2010  . DVT (deep venous thrombosis) 01/2007    bilateral  . Meningioma   . BPH (benign prostatic hyperplasia)   . Personal history of colonic polyps   . Venereal wart   . Depression   . Asthma   . Chickenpox   . Thrombophlebitis   . GERD (gastroesophageal reflux disease)   . Parkinson disease   . Dementia   . CAD (coronary artery disease)     a. 08/2009 s/p CABG x 2 (LIMA->LAD, SVG-OM). b. Adm w/ CP 09/2011 - questionable functionality of LIMA graft but patient declined further catheterization (cath 10/05/11 -- ostial LAD disease, possible flap in prox L subclavian/unable to image LIMA well, atretic SVG-OM)  . Hyperlipidemia   . Stricture and stenosis of esophagus   . Diverticulosis of colon (without mention of hemorrhage)   . Mitral regurgitation     a. 08/2009 s/p MV repair 22mm Edwards ring and oversewing of LA appendage @ time of CABG;  b. cardiac  MRI 09/2011 showing some disruption of lateral annuloplasty ring with "perivalvular" MR and some central MR   . Thrombus of left atrial appendage     a. Abnormal dye staining on cath 09/2011 - imaged by CT/MRI/TEE - felt to be surgically isolated from the LA  . Anxiety   . Stroke   . Atrial fibrillation     a. Paroxysmal, not Coumadin candidate 2/2 Parkinson's/gait instability  . Brain tumor     "right side of my brain" (12/22/2011)  . Arthritis     "very little" (12/22/2011)  . Pulmonary embolism 01/2007  . Aspiration pneumonia 03/2008  . Hiatal hernia     PARKINSONS TREMORS    Past Surgical History  Procedure Date  . Varicose vein surgery     left leg  . Mitral valve repair 08/2009  . Umbilical hernia repair   . Tee without cardioversion 10/06/2011    Procedure: TRANSESOPHAGEAL ECHOCARDIOGRAM (TEE);  Surgeon: Vesta Mixer, MD;  Location: [facility] ENDOSCOPY;  Service: Cardiovascular;  Laterality: N/A;  . Esophagogastroduodenoscopy 11/15/2011    Procedure: ESOPHAGOGASTRODUODENOSCOPY (EGD);  Surgeon: Meryl Dare, MD,FACG;  Location: [facility] ENDOSCOPY;  Service: Endoscopy;  Laterality: N/A;  . Tonsillectomy and adenoidectomy   . Mastoidectomy     "right; I'm really not sure what they did to my mastoid" (12/22/2011)  . Inguinal hernia repair     right  . Lumbar disc surgery     "  Dr. Jeral Fruit; herniated disc & remove bone spurs"  . Back surgery   . Coronary artery bypass graft 08/2009    CABG X2    Family History  Problem Relation Age of Onset  . Arthritis    . Diabetes    . Stroke Father   . Heart disease Mother     History  Substance Use Topics  . Smoking status: Former Smoker -- 1.0 packs/day for 10 years    Types: Cigarettes    Quit date: 02/22/1953  . Smokeless tobacco: Never Used  . Alcohol Use: No      Review of Systems All other systems reviewed and are negative except as noted in HPI.   Allergies  Ibuprofen  Home Medications   Current Outpatient Rx  Name   Route  Sig  Dispense  Refill  . AMIODARONE HCL 200 MG PO TABS   Oral   Take 200 mg by mouth every morning.          Marland Kitchen AMLODIPINE BESYLATE 2.5 MG PO TABS   Oral   Take 1 tablet (2.5 mg total) by mouth daily.   30 tablet   0   . ASPIRIN EC 325 MG PO TBEC   Oral   Take 325 mg by mouth daily.         Marland Kitchen CARBIDOPA-LEVODOPA 25-100 MG PO TABS   Oral   Take 1 tablet by mouth 3 (three) times daily.          Marland Kitchen CLONAZEPAM 1 MG PO TABS   Oral   Take 1 tablet (1 mg total) by mouth every 6 (six) hours.   120 tablet   5   . FUROSEMIDE 20 MG PO TABS   Oral   Take 20 mg by mouth daily as needed. For swelling         . ISOSORBIDE MONONITRATE ER 30 MG PO TB24   Oral   Take 0.5 tablets (15 mg total) by mouth daily.   30 tablet   6   . LEVOTHYROXINE SODIUM 75 MCG PO TABS   Oral   Take 75 mcg by mouth every morning.          . ADULT MULTIVITAMIN W/MINERALS CH   Oral   Take 1 tablet by mouth every morning.          Marland Kitchen NITROGLYCERIN 0.4 MG SL SUBL   Sublingual   Place 0.4 mg under the tongue every 5 (five) minutes as needed. For chest pain         . PANTOPRAZOLE SODIUM 40 MG PO TBEC   Oral   Take 40 mg by mouth 2 (two) times daily.         Marland Kitchen POLYETHYLENE GLYCOL 3350 PO PACK   Oral   Take 17 g by mouth daily as needed. For constipation         . PRAVASTATIN SODIUM 40 MG PO TABS   Oral   Take 40 mg by mouth every evening.          Marland Kitchen PRIMIDONE 50 MG PO TABS   Oral   Take 25 mg by mouth 2 (two) times daily.         Marland Kitchen RISPERIDONE 1 MG PO TABS      TAKE 1 TABLET AT BEDTIME.   30 tablet   11   . TAMSULOSIN HCL 0.4 MG PO CAPS   Oral   Take 0.4 mg by mouth every morning.          Marland Kitchen  TEMAZEPAM 30 MG PO CAPS   Oral   Take 30 mg by mouth at bedtime as needed. For sleep           BP 105/67  Pulse 78  Temp 97.6 F (36.4 C) (Oral)  Resp 15  SpO2 100%  Physical Exam  Nursing note and vitals reviewed. Constitutional: He is oriented to person,  place, and time. He appears well-developed and well-nourished.  HENT:  Head: Normocephalic and atraumatic.  Eyes: EOM are normal. Pupils are equal, round, and reactive to light.  Neck: Normal range of motion. Neck supple.  Cardiovascular: Normal rate, normal heart sounds and intact distal pulses.   Pulmonary/Chest: Effort normal and breath sounds normal.  Abdominal: Bowel sounds are normal. He exhibits no distension. There is no tenderness.  Musculoskeletal: He exhibits edema (mild edema to L face hand and LE, no pitting). He exhibits no tenderness.       Decreased ROM due to Parkinson's at baseline  Neurological: He is alert and oriented to person, place, and time. He has normal strength. No cranial nerve deficit or sensory deficit.  Skin: Skin is warm and dry. No rash noted.  Psychiatric: He has a normal mood and affect.    ED Course  Procedures (including critical care time)  Labs Reviewed  CBC WITH DIFFERENTIAL - Abnormal; Notable for the following:    RBC 3.98 (*)     Hemoglobin 9.8 (*)     HCT 31.4 (*)     MCH 24.6 (*)     RDW 18.0 (*)     Lymphocytes Relative 11 (*)     All other components within normal limits  BASIC METABOLIC PANEL - Abnormal; Notable for the following:    Glucose, Bld 103 (*)     GFR calc non Af Amer 56 (*)     GFR calc Af Amer 65 (*)     All other components within normal limits  URINALYSIS, ROUTINE W REFLEX MICROSCOPIC - Abnormal; Notable for the following:    Color, Urine AMBER (*)  BIOCHEMICALS MAY BE AFFECTED BY COLOR   APPearance CLOUDY (*)     Ketones, ur 15 (*)     Leukocytes, UA SMALL (*)     All other components within normal limits  PRO B NATRIURETIC PEPTIDE - Abnormal; Notable for the following:    Pro B Natriuretic peptide (BNP) 453.3 (*)     All other components within normal limits  URINE MICROSCOPIC-ADD ON - Abnormal; Notable for the following:    Casts HYALINE CASTS (*)  GRANULAR CAST   All other components within normal limits    TROPONIN I  TROPONIN I   Dg Chest 2 View  03/08/2012  *RADIOLOGY REPORT*  Clinical Data: Weakness, chest pain, shortness of breath.  CHEST - 2 VIEW  Comparison: 02/04/2012  Findings: 1320 hours.  The patient is rotated to the right.  Lung volumes are low with asymmetric elevation of the right hemidiaphragm.  There is bibasilar atelectasis or infiltrate, right greater than left. The cardiopericardial silhouette is enlarged. The patient is status post cardiac valve replacement.  IMPRESSION: No substantial interval change with bibasilar atelectasis or infiltrate.   Original Report Authenticated By: Kennith Center, M.D.      No diagnosis found.    MDM   Date: 03/08/2012  Rate: 78  Rhythm: artifact precludes definite rhythm analysis but regular QRS complexes and appears to be sinus  QRS Axis: normal  Intervals: normal  ST/T Wave abnormalities: normal  Conduction Disutrbances:left anterior fascicular block and incomplete RBBB  Narrative Interpretation:   Old EKG Reviewed: unchanged  Labs and imaging unremarkable. Troponin neg x2 and pain free in the ED for several hours. Pt with a cath done in the last 6 months and anginal symptoms relatively frequently, I suspect these symptoms are due to stable angina. Doubt ACS, unstable angina, NSTEMI etc. Doubt there is benefit to inpatient admission. Pt and family agreeable to going home. Advised close follow up in Cardiology office.          Charles B. Bernette Mayers, MD 03/08/12 1600

## 2012-03-08 NOTE — ED Notes (Signed)
Pt knows tat urine is needed

## 2012-03-08 NOTE — Telephone Encounter (Signed)
I sent script e-scribe and Dr. [name] is aware that pt is in ER now.

## 2012-03-08 NOTE — ED Notes (Signed)
Patient is going home via [facility]. They have been notified.

## 2012-03-09 ENCOUNTER — Telehealth: Payer: Self-pay | Admitting: Cardiology

## 2012-03-09 NOTE — Telephone Encounter (Signed)
Wife currently bathing pt, will call in 30 minutes per request. Reviewed ED report 03/08/12,

## 2012-03-09 NOTE — Telephone Encounter (Signed)
Agree with plan Brian Crenshaw  

## 2012-03-09 NOTE — Telephone Encounter (Signed)
Pt is having an angina attack and she is very frustrated he has taken 3 nitro and they need to know what to do he went to ED last night but they released him and she wants something done

## 2012-03-09 NOTE — Telephone Encounter (Signed)
Reviewed ED report, wife said his cp is mid center and feels like " dead ache pain". Same pain today  as yesterday. Offered app today/ declined. I made her an app for Dr Ludwig Clarks next date in [place] at end of month/ per wife request, told her to try a liquid stomach med like Maalox to coat his stomach, told him I will forward msg to DR/Nurse for further advise to see if pt needs medication adjustment. Told to call back with further questions or  go to er.  Wife was accepting of information and plan.

## 2012-03-13 ENCOUNTER — Other Ambulatory Visit: Payer: Self-pay | Admitting: Cardiovascular Disease

## 2012-03-14 ENCOUNTER — Ambulatory Visit: Payer: Medicare Other | Admitting: Family Medicine

## 2012-03-14 ENCOUNTER — Telehealth: Payer: Self-pay | Admitting: Cardiology

## 2012-03-14 ENCOUNTER — Telehealth: Payer: Self-pay | Admitting: Family Medicine

## 2012-03-14 NOTE — Telephone Encounter (Signed)
Agree with plan as outlined [name]  

## 2012-03-14 NOTE — Telephone Encounter (Signed)
The only thing I can suggest is what I have been saying for the past year. She needs to place him in a nursing facility where he can be taken care of properly.

## 2012-03-14 NOTE — Telephone Encounter (Signed)
Pts wife, Bjorn Loser, states that the pt continues to have angina that is not relieved with NTG X 3 and pt refuses to go to ER. Pt is scheduled to see Dr. Jens Som on 1/29, I offered to schedule a sooner appt with one of the extenders on a day that Dr. Jens Som is in office but she declined stating that they have to schedule transportation service 7 days prior to appt date. She states that she was advised by one of the nurses in this office last week to give the pt Maalox to coat his stomach and that she has not tried that yet but will now. Pt is advised that I will forward this note to Dr. Jens Som and ask if he has any further  Recommendations and that if he does we will contact her. She verbalized understanding.    FYI: She states that she was hospitalized over the weekend for exhaustion from taking care of the pt.

## 2012-03-14 NOTE — Telephone Encounter (Signed)
Pt having angina attacks more frequently had one today at 2:15pm and has questions regarding nitro and how often to take nitro she is very concerned and wants to talk to someone today

## 2012-03-14 NOTE — Telephone Encounter (Signed)
Pt wife calling in reguard to husbands care she states that she has been in the hospital and is being discharged today but will be unable to care for her husband like she was. Pt's wife also stated that [facility] is come to their house today so talk to them about what they qualify for but is requesting to be contacted by Dr. [name] pt states of she is contacted before 12 to call [phone]

## 2012-03-14 NOTE — Telephone Encounter (Signed)
I tried to reach pt's wife, she is on her way home. I left voice message for her to call us back or I will try again later.

## 2012-03-14 NOTE — Telephone Encounter (Signed)
I spoke with pt's wife and a Child psychotherapist is coming out tomorrow to talk with them about the options for nursing homes.

## 2012-03-16 ENCOUNTER — Telehealth: Payer: Self-pay | Admitting: Family Medicine

## 2012-03-16 NOTE — Telephone Encounter (Signed)
[place] social worker [name] saw pt yesterday and pt is experiencing overall decline in health and functions. The family is in need of some additional services. The family would like to get hospice involved. [place] social worker will get [facility] involved. FYI

## 2012-03-16 NOTE — Telephone Encounter (Signed)
[name] from [facility] called stating that [name] called them to get started with patient and she would like to know if you feel this patient is ready for Hospice and will you be the attending physician. Please advise.

## 2012-03-17 NOTE — Telephone Encounter (Signed)
I spoke with Vickie and gave below message.

## 2012-03-17 NOTE — Telephone Encounter (Signed)
Yes he is ready and yes I can be the main physician

## 2012-03-21 ENCOUNTER — Telehealth: Payer: Self-pay | Admitting: Gastroenterology

## 2012-03-21 NOTE — Telephone Encounter (Signed)
Patient's spouse will call when she believes patient is well enough

## 2012-03-22 ENCOUNTER — Encounter (HOSPITAL_COMMUNITY): Payer: Self-pay

## 2012-03-22 ENCOUNTER — Emergency Department (HOSPITAL_COMMUNITY)

## 2012-03-22 ENCOUNTER — Inpatient Hospital Stay (HOSPITAL_COMMUNITY)
Admission: EM | Admit: 2012-03-22 | Discharge: 2012-03-27 | DRG: 178 | Disposition: A | Discharge status: Hospice | Attending: Emergency Medicine | Admitting: Emergency Medicine

## 2012-03-22 ENCOUNTER — Ambulatory Visit: Payer: Medicare Other | Admitting: Cardiology

## 2012-03-22 DIAGNOSIS — F068 Other specified mental disorders due to known physiological condition: Secondary | ICD-10-CM

## 2012-03-22 DIAGNOSIS — G250 Essential tremor: Secondary | ICD-10-CM

## 2012-03-22 DIAGNOSIS — I059 Rheumatic mitral valve disease, unspecified: Secondary | ICD-10-CM | POA: Diagnosis present

## 2012-03-22 DIAGNOSIS — I809 Phlebitis and thrombophlebitis of unspecified site: Secondary | ICD-10-CM

## 2012-03-22 DIAGNOSIS — I951 Orthostatic hypotension: Secondary | ICD-10-CM

## 2012-03-22 DIAGNOSIS — Z87898 Personal history of other specified conditions: Secondary | ICD-10-CM

## 2012-03-22 DIAGNOSIS — Z86718 Personal history of other venous thrombosis and embolism: Secondary | ICD-10-CM

## 2012-03-22 DIAGNOSIS — Z9889 Other specified postprocedural states: Secondary | ICD-10-CM

## 2012-03-22 DIAGNOSIS — Z87891 Personal history of nicotine dependence: Secondary | ICD-10-CM

## 2012-03-22 DIAGNOSIS — Z860100 Personal history of colon polyps, unspecified: Secondary | ICD-10-CM

## 2012-03-22 DIAGNOSIS — E785 Hyperlipidemia, unspecified: Secondary | ICD-10-CM

## 2012-03-22 DIAGNOSIS — K224 Dyskinesia of esophagus: Secondary | ICD-10-CM

## 2012-03-22 DIAGNOSIS — M129 Arthropathy, unspecified: Secondary | ICD-10-CM | POA: Diagnosis present

## 2012-03-22 DIAGNOSIS — K449 Diaphragmatic hernia without obstruction or gangrene: Secondary | ICD-10-CM | POA: Diagnosis present

## 2012-03-22 DIAGNOSIS — Z886 Allergy status to analgesic agent status: Secondary | ICD-10-CM

## 2012-03-22 DIAGNOSIS — J309 Allergic rhinitis, unspecified: Secondary | ICD-10-CM

## 2012-03-22 DIAGNOSIS — I5040 Unspecified combined systolic (congestive) and diastolic (congestive) heart failure: Secondary | ICD-10-CM

## 2012-03-22 DIAGNOSIS — R627 Adult failure to thrive: Secondary | ICD-10-CM

## 2012-03-22 DIAGNOSIS — R339 Retention of urine, unspecified: Secondary | ICD-10-CM

## 2012-03-22 DIAGNOSIS — N390 Urinary tract infection, site not specified: Secondary | ICD-10-CM

## 2012-03-22 DIAGNOSIS — I200 Unstable angina: Secondary | ICD-10-CM

## 2012-03-22 DIAGNOSIS — Z8673 Personal history of transient ischemic attack (TIA), and cerebral infarction without residual deficits: Secondary | ICD-10-CM

## 2012-03-22 DIAGNOSIS — I4891 Unspecified atrial fibrillation: Secondary | ICD-10-CM

## 2012-03-22 DIAGNOSIS — K5900 Constipation, unspecified: Secondary | ICD-10-CM

## 2012-03-22 DIAGNOSIS — Z515 Encounter for palliative care: Secondary | ICD-10-CM

## 2012-03-22 DIAGNOSIS — I82409 Acute embolism and thrombosis of unspecified deep veins of unspecified lower extremity: Secondary | ICD-10-CM

## 2012-03-22 DIAGNOSIS — Z86011 Personal history of benign neoplasm of the brain: Secondary | ICD-10-CM

## 2012-03-22 DIAGNOSIS — D320 Benign neoplasm of cerebral meninges: Secondary | ICD-10-CM

## 2012-03-22 DIAGNOSIS — Z86711 Personal history of pulmonary embolism: Secondary | ICD-10-CM

## 2012-03-22 DIAGNOSIS — I10 Essential (primary) hypertension: Secondary | ICD-10-CM

## 2012-03-22 DIAGNOSIS — R0902 Hypoxemia: Secondary | ICD-10-CM

## 2012-03-22 DIAGNOSIS — J45909 Unspecified asthma, uncomplicated: Secondary | ICD-10-CM

## 2012-03-22 DIAGNOSIS — J690 Pneumonitis due to inhalation of food and vomit: Principal | ICD-10-CM

## 2012-03-22 DIAGNOSIS — Z66 Do not resuscitate: Secondary | ICD-10-CM | POA: Diagnosis present

## 2012-03-22 DIAGNOSIS — G20A1 Parkinson's disease without dyskinesia, without mention of fluctuations: Secondary | ICD-10-CM

## 2012-03-22 DIAGNOSIS — F411 Generalized anxiety disorder: Secondary | ICD-10-CM | POA: Diagnosis present

## 2012-03-22 DIAGNOSIS — R1319 Other dysphagia: Secondary | ICD-10-CM

## 2012-03-22 DIAGNOSIS — Z9089 Acquired absence of other organs: Secondary | ICD-10-CM

## 2012-03-22 DIAGNOSIS — G20 Parkinson's disease: Secondary | ICD-10-CM

## 2012-03-22 DIAGNOSIS — Z8719 Personal history of other diseases of the digestive system: Secondary | ICD-10-CM

## 2012-03-22 DIAGNOSIS — I513 Intracardiac thrombosis, not elsewhere classified: Secondary | ICD-10-CM

## 2012-03-22 DIAGNOSIS — F329 Major depressive disorder, single episode, unspecified: Secondary | ICD-10-CM | POA: Diagnosis present

## 2012-03-22 DIAGNOSIS — K219 Gastro-esophageal reflux disease without esophagitis: Secondary | ICD-10-CM

## 2012-03-22 DIAGNOSIS — I2510 Atherosclerotic heart disease of native coronary artery without angina pectoris: Secondary | ICD-10-CM

## 2012-03-22 DIAGNOSIS — R531 Weakness: Secondary | ICD-10-CM

## 2012-03-22 DIAGNOSIS — R0789 Other chest pain: Secondary | ICD-10-CM

## 2012-03-22 DIAGNOSIS — F3289 Other specified depressive episodes: Secondary | ICD-10-CM

## 2012-03-22 DIAGNOSIS — F0280 Dementia in other diseases classified elsewhere without behavioral disturbance: Secondary | ICD-10-CM | POA: Diagnosis present

## 2012-03-22 DIAGNOSIS — N183 Chronic kidney disease, stage 3 unspecified: Secondary | ICD-10-CM

## 2012-03-22 DIAGNOSIS — Z951 Presence of aortocoronary bypass graft: Secondary | ICD-10-CM

## 2012-03-22 LAB — CBC
HCT: 35.4 % — ABNORMAL LOW (ref 39.0–52.0)
Hemoglobin: 10.7 g/dL — ABNORMAL LOW (ref 13.0–17.0)
RBC: 4.42 MIL/uL (ref 4.22–5.81)
WBC: 7.9 10*3/uL (ref 4.0–10.5)

## 2012-03-22 MED ORDER — SODIUM CHLORIDE 0.9 % IV SOLN
Freq: Once | INTRAVENOUS | Status: AC
  Administered 2012-03-23: via INTRAVENOUS

## 2012-03-22 NOTE — ED Notes (Signed)
[id number]<NF> Expected date:<BR> Expected time:<BR> Means of arrival:<BR> Comments:<BR> EMS/72 yo male with flu symptoms

## 2012-03-22 NOTE — ED Provider Notes (Signed)
History     CSN: [id number]  Arrival date & time 03/22/12  2145   First MD Initiated Contact with Patient 03/22/12 2249      Chief Complaint  Patient presents with  . Chills  . Fatigue    (Consider location/radiation/quality/duration/timing/severity/associated sxs/prior treatment) HPI Comments: Spouse speaks for the patient due to his parkinson Dx.  pateint has been weak for the past 2 weeks today worse-- had anginal pain for which he was given Nitro X3 and Morphine with resolution of the pain. He has had 2 episodes of vomiting, has only been sipping on fluids, has been coughing and only urinated X1 today  Spouse states that he had had peripheral edema of the hands and lower legs for which she has been giving increased doses of Lasix with much improvement   The history is provided by the spouse.    Past Medical History  Diagnosis Date  . PE (pulmonary embolism)     a. >20 years ago;  b. nl V:Q scan 10/2010  . DVT (deep venous thrombosis) 01/2007    bilateral  . Meningioma   . BPH (benign prostatic hyperplasia)   . Personal history of colonic polyps   . Venereal wart   . Depression   . Asthma   . Chickenpox   . Thrombophlebitis   . GERD (gastroesophageal reflux disease)   . Parkinson disease   . Dementia   . CAD (coronary artery disease)     a. 08/2009 s/p CABG x 2 (LIMA->LAD, SVG-OM). b. Adm w/ CP 09/2011 - questionable functionality of LIMA graft but patient declined further catheterization (cath 10/05/11 -- ostial LAD disease, possible flap in prox L subclavian/unable to image LIMA well, atretic SVG-OM)  . Hyperlipidemia   . Stricture and stenosis of esophagus   . Diverticulosis of colon (without mention of hemorrhage)   . Mitral regurgitation     a. 08/2009 s/p MV repair 22mm Edwards ring and oversewing of LA appendage @ time of CABG;  b. cardiac MRI 09/2011 showing some disruption of lateral annuloplasty ring with "perivalvular" MR and some central MR   . Thrombus of left  atrial appendage     a. Abnormal dye staining on cath 09/2011 - imaged by CT/MRI/TEE - felt to be surgically isolated from the LA  . Anxiety   . Stroke   . Atrial fibrillation     a. Paroxysmal, not Coumadin candidate 2/2 Parkinson's/gait instability  . Brain tumor     "right side of my brain" (12/22/2011)  . Arthritis     "very little" (12/22/2011)  . Pulmonary embolism 01/2007  . Aspiration pneumonia 03/2008  . Hiatal hernia     PARKINSONS TREMORS    Past Surgical History  Procedure Date  . Varicose vein surgery     left leg  . Mitral valve repair 08/2009  . Umbilical hernia repair   . Tee without cardioversion 10/06/2011    Procedure: TRANSESOPHAGEAL ECHOCARDIOGRAM (TEE);  Surgeon: Vesta Mixer, MD;  Location: [facility] ENDOSCOPY;  Service: Cardiovascular;  Laterality: N/A;  . Esophagogastroduodenoscopy 11/15/2011    Procedure: ESOPHAGOGASTRODUODENOSCOPY (EGD);  Surgeon: Meryl Dare, MD,FACG;  Location: [facility] ENDOSCOPY;  Service: Endoscopy;  Laterality: N/A;  . Tonsillectomy and adenoidectomy   . Mastoidectomy     "right; I'm really not sure what they did to my mastoid" (12/22/2011)  . Inguinal hernia repair     right  . Lumbar disc surgery     "Dr. Jeral Fruit; herniated disc & remove bone  spurs"  . Back surgery   . Coronary artery bypass graft 08/2009    CABG X2    Family History  Problem Relation Age of Onset  . Arthritis    . Diabetes    . Stroke Father   . Heart disease Mother     History  Substance Use Topics  . Smoking status: Former Smoker -- 1.0 packs/day for 10 years    Types: Cigarettes    Quit date: 02/22/1953  . Smokeless tobacco: Never Used  . Alcohol Use: No      Review of Systems  Constitutional: Negative for fever and chills.  HENT: Negative for rhinorrhea.   Respiratory: Positive for cough and shortness of breath.   Cardiovascular: Positive for chest pain and leg swelling.  Gastrointestinal: Positive for nausea and vomiting. Negative for diarrhea  and constipation.  Genitourinary: Positive for decreased urine volume. Negative for dysuria.  Skin: Negative for rash and wound.  Neurological: Positive for weakness.    Allergies  Ibuprofen  Home Medications   No current outpatient prescriptions on file.  BP 118/67  Pulse 85  Temp 97.7 F (36.5 C) (Oral)  Resp 16  Wt 216 lb 4.3 oz (98.1 kg)  SpO2 93%  Physical Exam  Constitutional: He is oriented to person, place, and time. He appears well-developed and well-nourished.  HENT:  Head: Normocephalic.  Eyes: Pupils are equal, round, and reactive to light.  Neck: Normal range of motion.  Cardiovascular: Normal rate.   Pulmonary/Chest: Effort normal and breath sounds normal. No respiratory distress. He exhibits no tenderness.  Abdominal: Soft. Bowel sounds are normal. He exhibits no distension. There is no tenderness.  Musculoskeletal: Normal range of motion. He exhibits no edema.  Neurological: He is alert and oriented to person, place, and time.  Skin: Skin is warm and dry. There is pallor.    ED Course  Procedures (including critical care time)  Labs Reviewed  CBC - Abnormal; Notable for the following:    Hemoglobin 10.7 (*)     HCT 35.4 (*)     MCH 24.2 (*)     RDW 17.9 (*)     All other components within normal limits  COMPREHENSIVE METABOLIC PANEL - Abnormal; Notable for the following:    Glucose, Bld 105 (*)     Creatinine, Ser 1.38 (*)     GFR calc non Af Amer 50 (*)     GFR calc Af Amer 58 (*)     All other components within normal limits  URINALYSIS, MICROSCOPIC ONLY - Abnormal; Notable for the following:    APPearance CLOUDY (*)     Hgb urine dipstick TRACE (*)     Ketones, ur TRACE (*)     Leukocytes, UA LARGE (*)     All other components within normal limits  TROPONIN I   Dg Chest 2 View  03/23/2012  *RADIOLOGY REPORT*  Clinical Data: Cough, weakness.  CHEST - 2 VIEW  Comparison: 03/08/2012  Findings: Degraded by unconventional position and  hypoaeration. Prominent cardiomediastinal contours.  Status post median sternotomy, valve replacement, and CABG. There are bibasilar opacities, increased on the left and mildly decreased on the right. Increased interstitial markings.  Small effusions suggested.  No pneumothorax.  No acute osseous finding.  Osteopenia.  IMPRESSION: Prominent cardiomediastinal contours and bibasilar opacities; atelectasis versus pneumonia.  Hypoaeration with interstitial crowding.  Interstitial edema not excluded.   Original Report Authenticated By: Jearld Lesch, M.D.      1. Aspiration  pneumonia   2. Hypoxia   3. Weak   4. UTI (lower urinary tract infection)   5. Adult failure to thrive   6. Urinary retention       MDM  Bladder scan reveals greater than 250 cc         Arman Filter, NP 03/23/12 1610

## 2012-03-22 NOTE — ED Notes (Signed)
Family reports increased sleepiness. This morning pt had episode of angina. Pt took morphine and NTG. Pt has no complaints at the time. Small cough noted.

## 2012-03-23 ENCOUNTER — Encounter (HOSPITAL_COMMUNITY): Payer: Self-pay | Admitting: Internal Medicine

## 2012-03-23 DIAGNOSIS — N390 Urinary tract infection, site not specified: Secondary | ICD-10-CM | POA: Diagnosis present

## 2012-03-23 DIAGNOSIS — R339 Retention of urine, unspecified: Secondary | ICD-10-CM

## 2012-03-23 DIAGNOSIS — R627 Adult failure to thrive: Secondary | ICD-10-CM

## 2012-03-23 DIAGNOSIS — J690 Pneumonitis due to inhalation of food and vomit: Principal | ICD-10-CM

## 2012-03-23 LAB — COMPREHENSIVE METABOLIC PANEL
ALT: 9 U/L (ref 0–53)
AST: 18 U/L (ref 0–37)
Alkaline Phosphatase: 79 U/L (ref 39–117)
CO2: 24 mEq/L (ref 19–32)
GFR calc Af Amer: 58 mL/min — ABNORMAL LOW (ref >90)
GFR calc non Af Amer: 50 mL/min — ABNORMAL LOW (ref >90)
Glucose, Bld: 105 mg/dL — ABNORMAL HIGH (ref 70–99)
Potassium: 4.8 mEq/L (ref 3.5–5.1)
Sodium: 140 mEq/L (ref 135–145)

## 2012-03-23 LAB — URINALYSIS, MICROSCOPIC ONLY
Bilirubin Urine: NEGATIVE
Nitrite: NEGATIVE
Urobilinogen, UA: 0.2 mg/dL (ref 0.0–1.0)
pH: 6 (ref 5.0–8.0)

## 2012-03-23 MED ORDER — DOCUSATE SODIUM 100 MG PO CAPS
100.0000 mg | ORAL_CAPSULE | Freq: Two times a day (BID) | ORAL | Status: DC
  Administered 2012-03-23 – 2012-03-27 (×7): 100 mg via ORAL
  Filled 2012-03-23 (×9): qty 1

## 2012-03-23 MED ORDER — CLINDAMYCIN PHOSPHATE 600 MG/50ML IV SOLN
600.0000 mg | Freq: Three times a day (TID) | INTRAVENOUS | Status: DC
  Administered 2012-03-23 – 2012-03-27 (×13): 600 mg via INTRAVENOUS
  Filled 2012-03-23 (×15): qty 50

## 2012-03-23 MED ORDER — NITROGLYCERIN 0.4 MG SL SUBL
0.4000 mg | SUBLINGUAL_TABLET | SUBLINGUAL | Status: DC | PRN
  Administered 2012-03-24 (×2): 0.4 mg via SUBLINGUAL
  Filled 2012-03-23: qty 25

## 2012-03-23 MED ORDER — DEXTROSE 5 % IV SOLN
1.0000 g | INTRAVENOUS | Status: DC
  Administered 2012-03-24 – 2012-03-27 (×4): 1 g via INTRAVENOUS
  Filled 2012-03-23 (×4): qty 10

## 2012-03-23 MED ORDER — SIMVASTATIN 20 MG PO TABS
20.0000 mg | ORAL_TABLET | Freq: Every day | ORAL | Status: DC
  Administered 2012-03-23 – 2012-03-26 (×4): 20 mg via ORAL
  Filled 2012-03-23 (×5): qty 1

## 2012-03-23 MED ORDER — ISOSORBIDE MONONITRATE 15 MG HALF TABLET: 15.0000 mg | ORAL_TABLET | Freq: Every morning | ORAL | Status: DC

## 2012-03-23 MED ORDER — LEVOTHYROXINE SODIUM 75 MCG PO TABS
75.0000 ug | ORAL_TABLET | Freq: Every morning | ORAL | Status: DC
  Administered 2012-03-23 – 2012-03-27 (×5): 75 ug via ORAL
  Filled 2012-03-23 (×6): qty 1

## 2012-03-23 MED ORDER — BISACODYL 5 MG PO TBEC: 10.0000 mg | DELAYED_RELEASE_TABLET | Freq: Every day | ORAL | Status: DC | PRN

## 2012-03-23 MED ORDER — ISOSORBIDE MONONITRATE 15 MG HALF TABLET
15.0000 mg | ORAL_TABLET | Freq: Every day | ORAL | Status: DC
  Administered 2012-03-23 – 2012-03-24 (×2): 15 mg via ORAL
  Filled 2012-03-23 (×2): qty 1

## 2012-03-23 MED ORDER — MORPHINE SULFATE (CONCENTRATE) 10 MG /0.5 ML PO SOLN
5.0000 mg | ORAL | Status: DC | PRN
  Administered 2012-03-24 (×2): 5 mg via ORAL
  Filled 2012-03-23 (×2): qty 0.5

## 2012-03-23 MED ORDER — TEMAZEPAM 15 MG PO CAPS: 30.0000 mg | ORAL_CAPSULE | Freq: Every evening | ORAL | Status: DC | PRN

## 2012-03-23 MED ORDER — RISPERIDONE 1 MG PO TABS
1.0000 mg | ORAL_TABLET | Freq: Every evening | ORAL | Status: DC
  Administered 2012-03-23 – 2012-03-26 (×4): 1 mg via ORAL
  Filled 2012-03-23 (×5): qty 1

## 2012-03-23 MED ORDER — POLYETHYLENE GLYCOL 3350 17 G PO PACK
17.0000 g | PACK | Freq: Every day | ORAL | Status: DC | PRN
  Filled 2012-03-23: qty 1

## 2012-03-23 MED ORDER — PANTOPRAZOLE SODIUM 40 MG PO TBEC
40.0000 mg | DELAYED_RELEASE_TABLET | Freq: Two times a day (BID) | ORAL | Status: DC
  Administered 2012-03-23 – 2012-03-27 (×9): 40 mg via ORAL
  Filled 2012-03-23 (×9): qty 1

## 2012-03-23 MED ORDER — FUROSEMIDE 20 MG PO TABS
20.0000 mg | ORAL_TABLET | Freq: Every morning | ORAL | Status: DC
Start: 2012-03-23 — End: 2012-03-27
  Administered 2012-03-23 – 2012-03-27 (×5): 20 mg via ORAL
  Filled 2012-03-23 (×5): qty 1

## 2012-03-23 MED ORDER — AMLODIPINE BESYLATE 2.5 MG PO TABS
2.5000 mg | ORAL_TABLET | Freq: Every day | ORAL | Status: DC
  Administered 2012-03-23 – 2012-03-26 (×4): 2.5 mg via ORAL
  Filled 2012-03-23 (×5): qty 1

## 2012-03-23 MED ORDER — CARBIDOPA-LEVODOPA 25-100 MG PO TABS
1.0000 | ORAL_TABLET | Freq: Three times a day (TID) | ORAL | Status: DC
  Administered 2012-03-23 – 2012-03-27 (×13): 1 via ORAL
  Filled 2012-03-23 (×15): qty 1

## 2012-03-23 MED ORDER — HYDROCORTISONE 1 % EX CREA
1.0000 "application " | TOPICAL_CREAM | Freq: Two times a day (BID) | CUTANEOUS | Status: DC
  Administered 2012-03-23 – 2012-03-26 (×3): 1 via TOPICAL
  Filled 2012-03-23: qty 28

## 2012-03-23 MED ORDER — BIOTENE DRY MOUTH MT LIQD
15.0000 mL | Freq: Two times a day (BID) | OROMUCOSAL | Status: DC
  Administered 2012-03-23 – 2012-03-26 (×7): 15 mL via OROMUCOSAL

## 2012-03-23 MED ORDER — AMIODARONE HCL 200 MG PO TABS
200.0000 mg | ORAL_TABLET | Freq: Every morning | ORAL | Status: DC
Start: 2012-03-23 — End: 2012-03-27
  Administered 2012-03-23 – 2012-03-27 (×5): 200 mg via ORAL
  Filled 2012-03-23 (×5): qty 1

## 2012-03-23 MED ORDER — ACETAMINOPHEN 650 MG RE SUPP: 650.0000 mg | Freq: Four times a day (QID) | RECTAL | Status: DC | PRN

## 2012-03-23 MED ORDER — LORAZEPAM 0.5 MG PO TABS
0.5000 mg | ORAL_TABLET | Freq: Four times a day (QID) | ORAL | Status: DC
  Administered 2012-03-23 (×2): 0.5 mg via ORAL
  Administered 2012-03-24: 1 mg via ORAL
  Administered 2012-03-24: 0.5 mg via ORAL
  Administered 2012-03-24 (×2): 1 mg via ORAL
  Administered 2012-03-25 (×3): 0.5 mg via ORAL
  Administered 2012-03-26 (×2): 1 mg via ORAL
  Administered 2012-03-26 – 2012-03-27 (×4): 0.5 mg via ORAL
  Filled 2012-03-23 (×3): qty 1
  Filled 2012-03-23: qty 2
  Filled 2012-03-23 (×3): qty 1
  Filled 2012-03-23 (×2): qty 2
  Filled 2012-03-23: qty 1
  Filled 2012-03-23: qty 2
  Filled 2012-03-23: qty 1
  Filled 2012-03-23: qty 2
  Filled 2012-03-23 (×2): qty 1

## 2012-03-23 MED ORDER — HEPARIN SODIUM (PORCINE) 5000 UNIT/ML IJ SOLN
5000.0000 [IU] | Freq: Three times a day (TID) | INTRAMUSCULAR | Status: DC
Start: 2012-03-23 — End: 2012-03-27
  Administered 2012-03-23 – 2012-03-27 (×13): 5000 [IU] via SUBCUTANEOUS
  Filled 2012-03-23 (×16): qty 1

## 2012-03-23 MED ORDER — CHLORHEXIDINE GLUCONATE 0.12 % MT SOLN
15.0000 mL | Freq: Two times a day (BID) | OROMUCOSAL | Status: DC
  Administered 2012-03-23 – 2012-03-26 (×5): 15 mL via OROMUCOSAL
  Filled 2012-03-23 (×11): qty 15

## 2012-03-23 MED ORDER — ACETAMINOPHEN 325 MG PO TABS: 650.0000 mg | ORAL_TABLET | Freq: Four times a day (QID) | ORAL | Status: DC | PRN

## 2012-03-23 MED ORDER — SODIUM CHLORIDE 0.9 % IV SOLN
Freq: Once | INTRAVENOUS | Status: AC
  Administered 2012-03-23: 02:00:00 via INTRAVENOUS

## 2012-03-23 MED ORDER — AMLODIPINE BESYLATE 2.5 MG PO TABS: 2.5000 mg | ORAL_TABLET | Freq: Every morning | ORAL | Status: DC

## 2012-03-23 MED ORDER — DEXTROSE 5 % IV SOLN
1.0000 g | Freq: Once | INTRAVENOUS | Status: AC
  Administered 2012-03-23: 1 g via INTRAVENOUS
  Filled 2012-03-23: qty 10

## 2012-03-23 MED ORDER — ONDANSETRON HCL 4 MG/2ML IJ SOLN: 4.0000 mg | Freq: Four times a day (QID) | INTRAMUSCULAR | Status: DC | PRN

## 2012-03-23 MED ORDER — PRIMIDONE 50 MG PO TABS
25.0000 mg | ORAL_TABLET | Freq: Two times a day (BID) | ORAL | Status: DC
Start: 2012-03-23 — End: 2012-03-27
  Administered 2012-03-23 – 2012-03-27 (×9): 25 mg via ORAL
  Filled 2012-03-23 (×11): qty 0.5

## 2012-03-23 MED ORDER — SODIUM CHLORIDE 0.9 % IV SOLN
INTRAVENOUS | Status: DC
Start: 2012-03-23 — End: 2012-03-27
  Administered 2012-03-23 – 2012-03-26 (×5): via INTRAVENOUS

## 2012-03-23 MED ORDER — TAMSULOSIN HCL 0.4 MG PO CAPS
0.4000 mg | ORAL_CAPSULE | Freq: Every morning | ORAL | Status: DC
  Administered 2012-03-23 – 2012-03-24 (×2): 0.4 mg via ORAL
  Filled 2012-03-23 (×2): qty 1

## 2012-03-23 MED ORDER — CLINDAMYCIN PHOSPHATE 600 MG/50ML IV SOLN
600.0000 mg | Freq: Once | INTRAVENOUS | Status: AC
  Administered 2012-03-23: 600 mg via INTRAVENOUS
  Filled 2012-03-23: qty 50

## 2012-03-23 MED ORDER — ASPIRIN EC 325 MG PO TBEC
325.0000 mg | DELAYED_RELEASE_TABLET | Freq: Every morning | ORAL | Status: DC
  Administered 2012-03-23 – 2012-03-27 (×5): 325 mg via ORAL
  Filled 2012-03-23 (×5): qty 1

## 2012-03-23 MED ORDER — ONDANSETRON HCL 4 MG PO TABS: 4.0000 mg | ORAL_TABLET | Freq: Four times a day (QID) | ORAL | Status: DC | PRN

## 2012-03-23 NOTE — ED Notes (Signed)
Dr. Kakrakandy, hospitalist, at bedside. 

## 2012-03-23 NOTE — H&P (Signed)
[name] is an 72 y.o. male.   Patient was seen and examined on March 23, 2012. PCP - Dr. Gershon Crane. Presently followed by hospice. Chief Complaint: Aspiration and choking spells. HPI: 72 year-old male with history of Parkinson's disease, CAD status post CABG, dementia, atrial fibrillation, mitral valve repair, hypertension who has been under hospice care for last 2 days started developing some chest pain yesterday around 10:30. Patient was given nitroglycerin sublingual and wife called the hospice team and was instructed to give morphine after which patient's chest pain improved and resolved. Around noontime after lunch patient started coughing a lot with choking-like episodes. His cough continued and in addition patient was found to be very weak. He usually ambulates but over the last 2 days he has not done well with ambulation. Patient was brought to the ER chest x-ray showed possibilities of pneumonia. Patient also has not urinated well and bladder scan done in the ER showed more than 300 cc residual and a Foley catheter was placed after which urine drain and UA shows features of UTI. At this time patient has been admitted for further management.  Patient's wife at this time has said that she wants her husband be admitted as he has deteriorated and is not expecting any aggressive measures.  Past Medical History  Diagnosis Date  . PE (pulmonary embolism)     a. >20 years ago;  b. nl V:Q scan 10/2010  . DVT (deep venous thrombosis) 01/2007    bilateral  . Meningioma   . BPH (benign prostatic hyperplasia)   . Personal history of colonic polyps   . Venereal wart   . Depression   . Asthma   . Chickenpox   . Thrombophlebitis   . GERD (gastroesophageal reflux disease)   . Parkinson disease   . Dementia   . CAD (coronary artery disease)     a. 08/2009 s/p CABG x 2 (LIMA->LAD, SVG-OM). b. Adm w/ CP 09/2011 - questionable functionality of LIMA graft but patient declined further  catheterization (cath 10/05/11 -- ostial LAD disease, possible flap in prox L subclavian/unable to image LIMA well, atretic SVG-OM)  . Hyperlipidemia   . Stricture and stenosis of esophagus   . Diverticulosis of colon (without mention of hemorrhage)   . Mitral regurgitation     a. 08/2009 s/p MV repair 22mm Edwards ring and oversewing of LA appendage @ time of CABG;  b. cardiac MRI 09/2011 showing some disruption of lateral annuloplasty ring with "perivalvular" MR and some central MR   . Thrombus of left atrial appendage     a. Abnormal dye staining on cath 09/2011 - imaged by CT/MRI/TEE - felt to be surgically isolated from the LA  . Anxiety   . Stroke   . Atrial fibrillation     a. Paroxysmal, not Coumadin candidate 2/2 Parkinson's/gait instability  . Brain tumor     "right side of my brain" (12/22/2011)  . Arthritis     "very little" (12/22/2011)  . Pulmonary embolism 01/2007  . Aspiration pneumonia 03/2008  . Hiatal hernia     PARKINSONS TREMORS    Past Surgical History  Procedure Date  . Varicose vein surgery     left leg  . Mitral valve repair 08/2009  . Umbilical hernia repair   . Tee without cardioversion 10/06/2011    Procedure: TRANSESOPHAGEAL ECHOCARDIOGRAM (TEE);  Surgeon: Vesta Mixer, MD;  Location: [facility] ENDOSCOPY;  Service: Cardiovascular;  Laterality: N/A;  . Esophagogastroduodenoscopy 11/15/2011  Procedure: ESOPHAGOGASTRODUODENOSCOPY (EGD);  Surgeon: Meryl Dare, MD,FACG;  Location: [facility] ENDOSCOPY;  Service: Endoscopy;  Laterality: N/A;  . Tonsillectomy and adenoidectomy   . Mastoidectomy     "right; I'm really not sure what they did to my mastoid" (12/22/2011)  . Inguinal hernia repair     right  . Lumbar disc surgery     "Dr. Jeral Fruit; herniated disc & remove bone spurs"  . Back surgery   . Coronary artery bypass graft 08/2009    CABG X2    Family History  Problem Relation Age of Onset  . Arthritis    . Diabetes    . Stroke Father   . Heart disease Mother     Social History:  reports that he quit smoking about 59 years ago. His smoking use included Cigarettes. He has a 10 pack-year smoking history. He has never used smokeless tobacco. He reports that he does not drink alcohol or use illicit drugs.  Allergies:  Allergies  Allergen Reactions  . Ibuprofen Other (See Comments)    Stomach hurts     (Not in a hospital admission)  Results for orders placed during the hospital encounter of 03/22/12 (from the past 48 hour(s))  CBC     Status: Abnormal   Collection Time   03/22/12 11:43 PM      Component Value Range Comment   WBC 7.9  4.0 - 10.5 K/uL    RBC 4.42  4.22 - 5.81 MIL/uL    Hemoglobin 10.7 (*) 13.0 - 17.0 g/dL    HCT 47.8 (*) 29.5 - 52.0 %    MCV 80.1  78.0 - 100.0 fL    MCH 24.2 (*) 26.0 - 34.0 pg    MCHC 30.2  30.0 - 36.0 g/dL    RDW 62.1 (*) 30.8 - 15.5 %    Platelets 209  150 - 400 K/uL   TROPONIN I     Status: Normal   Collection Time   03/22/12 11:43 PM      Component Value Range Comment   Troponin I <0.30  <0.30 ng/mL   COMPREHENSIVE METABOLIC PANEL     Status: Abnormal   Collection Time   03/22/12 11:43 PM      Component Value Range Comment   Sodium 140  135 - 145 mEq/L    Potassium 4.8  3.5 - 5.1 mEq/L    Chloride 102  96 - 112 mEq/L    CO2 24  19 - 32 mEq/L    Glucose, Bld 105 (*) 70 - 99 mg/dL    BUN 16  6 - 23 mg/dL    Creatinine, Ser 6.57 (*) 0.50 - 1.35 mg/dL    Calcium 8.9  8.4 - 84.6 mg/dL    Total Protein 7.3  6.0 - 8.3 g/dL    Albumin 3.7  3.5 - 5.2 g/dL    AST 18  0 - 37 U/L    ALT 9  0 - 53 U/L    Alkaline Phosphatase 79  39 - 117 U/L    Total Bilirubin 0.5  0.3 - 1.2 mg/dL    GFR calc non Af Amer 50 (*) >90 mL/min    GFR calc Af Amer 58 (*) >90 mL/min   URINALYSIS, MICROSCOPIC ONLY     Status: Abnormal   Collection Time   03/23/12 12:27 AM      Component Value Range Comment   Color, Urine YELLOW  YELLOW    APPearance CLOUDY (*) CLEAR  Specific Gravity, Urine 1.021  1.005 - 1.030    pH 6.0   5.0 - 8.0    Glucose, UA NEGATIVE  NEGATIVE mg/dL    Hgb urine dipstick TRACE (*) NEGATIVE    Bilirubin Urine NEGATIVE  NEGATIVE    Ketones, ur TRACE (*) NEGATIVE mg/dL    Protein, ur NEGATIVE  NEGATIVE mg/dL    Urobilinogen, UA 0.2  0.0 - 1.0 mg/dL    Nitrite NEGATIVE  NEGATIVE    Leukocytes, UA LARGE (*) NEGATIVE    WBC, UA 11-20  <3 WBC/hpf    RBC / HPF 0-2  <3 RBC/hpf    Dg Chest 2 View  03/23/2012  *RADIOLOGY REPORT*  Clinical Data: Cough, weakness.  CHEST - 2 VIEW  Comparison: 03/08/2012  Findings: Degraded by unconventional position and hypoaeration. Prominent cardiomediastinal contours.  Status post median sternotomy, valve replacement, and CABG. There are bibasilar opacities, increased on the left and mildly decreased on the right. Increased interstitial markings.  Small effusions suggested.  No pneumothorax.  No acute osseous finding.  Osteopenia.  IMPRESSION: Prominent cardiomediastinal contours and bibasilar opacities; atelectasis versus pneumonia.  Hypoaeration with interstitial crowding.  Interstitial edema not excluded.   Original Report Authenticated By: Jearld Lesch, M.D.     Review of Systems  Constitutional: Positive for chills.  HENT: Negative.   Eyes: Negative.   Respiratory: Positive for cough (with choking like spells).   Cardiovascular: Negative.   Gastrointestinal: Negative.   Genitourinary:       Difficulty urinating.  Musculoskeletal: Negative.   Skin: Negative.   Neurological: Positive for weakness.  Endo/Heme/Allergies: Negative.   Psychiatric/Behavioral: Negative.     Blood pressure 110/73, pulse 80, temperature 97.5 F (36.4 C), temperature source Oral, resp. rate 14, SpO2 98.00%. Physical Exam  Constitutional: He appears well-developed and well-nourished. No distress.  HENT:  Head: Normocephalic and atraumatic.  Right Ear: External ear normal.  Left Ear: External ear normal.  Nose: Nose normal.  Mouth/Throat: Oropharynx is clear and moist.  No oropharyngeal exudate.  Eyes: Conjunctivae normal are normal. Pupils are equal, round, and reactive to light. Right eye exhibits no discharge. Left eye exhibits no discharge. No scleral icterus.  Neck: Normal range of motion. Neck supple.  Cardiovascular: Normal rate and regular rhythm.   Respiratory: Effort normal and breath sounds normal. No respiratory distress. He has no wheezes. He has no rales.  GI: Soft. Bowel sounds are normal. He exhibits no distension. There is no tenderness. There is no rebound.  Musculoskeletal: He exhibits no edema and no tenderness.  Neurological: He is alert.       Oriented to his name. Follows commands and moves all extremities.  Skin: He is not diaphoretic.     Assessment/Plan #1. Aspiration pneumonia. #2. Urinary retention of UTI. #3. Adult failure to thrive. #4. CAD status post CABG. #5. Atrial fibrillation presently rate controlled not a candidate for Coumadin. #6. Parkinson's disease. #7. Dementia.  Plan - at this time I have discussed with patient's wife who wants patient to be admitted as patient has deteriorated and is unable to walk which he usually does. Okay for antibiotics and IV fluids but does not want any further labs done. Continue with his home medications including morphine and lorazepam. Patient was supposed is on Klonopin today. Consult hospice team in a.m. Patient is on clindamycin and ceftriaxone for aspiration and UTI. No further labs has been ordered as requested by patient's wife. Patient's wife has also requested to continue with  diet liquid nectar thick with the knowledge that patient does aspirate. As per patient's wishes patient did not want any artificial means of prolonging life. We will honor this.  CODE STATUS - DO NOT RESUSCITATE as discussed with patient's wife.  Reshaun Briseno N. 03/23/2012, 3:30 AM

## 2012-03-23 NOTE — Progress Notes (Signed)
[facility] PROGRESS NOTE  [name] WUJ:[id number] DOB: [birth year] DOA: 03/22/2012 PCP: Nelwyn Salisbury, MD  Assessment/Plan  #1. Aspiration pneumonia.  Improving. -  Wean to room air as tolerated - Continue clindamycin -  Monitor for diarrhea #2. Urinary retention with UTI -  F/u urine culture -  Continue ceftriaxone #3. Adult failure to thrive.  #4. CAD status post CABG.  #5. Atrial fibrillation presently rate controlled not a candidate for Coumadin.  #6. Parkinson's disease.  #7. Dementia.   Plan to continue antibiotics and fluids for now and palliative diet.  Spoke with patient today about his goals of care and discussed with the hospice care worker today also.  He would prefer not to be hospitalized, but would like to be treated for dehydration, pneumonia, urinary tract infection and he understands the conflict in these desires.  Additionally, his daughter who is one of his primary caretakers was just admitted to the hospital today and his wife, his other caretaker, was admitted to the hospital within the last two weeks for exhaustion.  He is very deconditioned and may be safer in a skilled nursing facility until his wife and daughter are healthy enough to care for him again.    Diet:  palliative Access:  piv IVF:  NS at 59ml/h Proph:  SCDs   Code Status: DNR/DNI Family Communication: spoke with patietn only.  Wife was at home asleep and asked not to be disturbed.  Nurse was going to update wife and contact me if she wanted to speak with me this evening Disposition Plan: pending conversation about goals of care. Will have PT/OT assessments tomorrow   Consultants:  none  Procedures:  none  Antibiotics:  clinda 1/30 >>  Ceftriaxone 1/30 >>   HPI/Subjective: Patient denies CP, SOB, N/V/D/C.  He has a foley catheter in place that he would like to have left in because he is in continent of urine.  Denies dysuria or problems with foley catheter.     Objective: Filed Vitals:   03/22/12 2207 03/22/12 2344 03/23/12 0437  BP: 122/77 110/73 118/67  Pulse: 87 80 85  Temp: 98.3 F (36.8 C) 97.5 F (36.4 C) 97.7 F (36.5 C)  TempSrc:  Oral Oral  Resp: 20 14 16   Height:   6' 3.2" (1.91 m)  Weight:   98.1 kg (216 lb 4.3 oz)  SpO2: 99% 98% 93%    Intake/Output Summary (Last 24 hours) at 03/23/12 2100 Last data filed at 03/23/12 1918  Gross per 24 hour  Intake    560 ml  Output    900 ml  Net   -340 ml   Filed Weights   03/23/12 0437  Weight: 98.1 kg (216 lb 4.3 oz)    Exam:   General:  Caucasian male, no acute distress, sitting in bed, nasal canula in place  HEENT:  MMM  Cardiovascular:  RRR, no mrg, 2+ pulses  Respiratory:  Course bilateral breath sounds, no wheezes or focal rales.    Abdomen:  NABS, soft, nondistended, nontender, no organomegaly  MSK:  Normal tone and bulk  Neuro:  Full body tremor, grossly intact, no focal deficits  Data Reviewed: Basic Metabolic Panel:  Lab 03/22/12 9562  NA 140  K 4.8  CL 102  CO2 24  GLUCOSE 105*  BUN 16  CREATININE 1.38*  CALCIUM 8.9  MG --  PHOS --   Liver Function Tests:  Lab 03/22/12 2343  AST 18  ALT 9  ALKPHOS 79  BILITOT 0.5  PROT 7.3  ALBUMIN 3.7   No results found for this basename: LIPASE:5,AMYLASE:5 in the last 168 hours No results found for this basename: AMMONIA:5 in the last 168 hours CBC:  Lab 03/22/12 2343  WBC 7.9  NEUTROABS --  HGB 10.7*  HCT 35.4*  MCV 80.1  PLT 209   Cardiac Enzymes:  Lab 03/22/12 2343  CKTOTAL --  CKMB --  CKMBINDEX --  TROPONINI <0.30   BNP (last 3 results)  Basename 03/08/12 1248 02/04/12 1916 10/05/11 0508  PROBNP 453.3* 320.1* 534.4*   CBG: No results found for this basename: GLUCAP:5 in the last 168 hours  No results found for this or any previous visit (from the past 240 hour(s)).   Studies: Dg Chest 2 View  03/23/2012  *RADIOLOGY REPORT*  Clinical Data: Cough, weakness.  CHEST - 2  VIEW  Comparison: 03/08/2012  Findings: Degraded by unconventional position and hypoaeration. Prominent cardiomediastinal contours.  Status post median sternotomy, valve replacement, and CABG. There are bibasilar opacities, increased on the left and mildly decreased on the right. Increased interstitial markings.  Small effusions suggested.  No pneumothorax.  No acute osseous finding.  Osteopenia.  IMPRESSION: Prominent cardiomediastinal contours and bibasilar opacities; atelectasis versus pneumonia.  Hypoaeration with interstitial crowding.  Interstitial edema not excluded.   Original Report Authenticated By: Jearld Lesch, M.D.     Scheduled Meds:   . amiodarone  200 mg Oral q morning - 10a  . amLODipine  2.5 mg Oral Daily  . antiseptic oral rinse  15 mL Mouth Rinse q12n4p  . aspirin EC  325 mg Oral q morning - 10a  . carbidopa-levodopa  1 tablet Oral TID  . cefTRIAXone (ROCEPHIN)  IV  1 g Intravenous Q24H  . chlorhexidine  15 mL Mouth Rinse BID  . clindamycin (CLEOCIN) IV  600 mg Intravenous Q8H  . furosemide  20 mg Oral q morning - 10a  . heparin  5,000 Units Subcutaneous Q8H  . hydrocortisone cream  1 application Topical BID  . isosorbide mononitrate  15 mg Oral Daily  . levothyroxine  75 mcg Oral q morning - 10a  . LORazepam  0.5-1 mg Oral Q6H  . pantoprazole  40 mg Oral BID  . primidone  25 mg Oral BID  . risperiDONE  1 mg Oral QPM  . simvastatin  20 mg Oral q1800  . Tamsulosin HCl  0.4 mg Oral q morning - 10a   Continuous Infusions:   . sodium chloride 50 mL/hr at 03/23/12 4540    Principal Problem:  *Aspiration pneumonia Active Problems:  PARKINSON'S DISEASE  Urinary retention  UTI (lower urinary tract infection)    Time spent: 30 min    Anel Purohit, [facility]  [facility] Pager [phone]. If 8PM-8AM, please contact night-coverage at [email or web address], password [facility] 03/23/2012, 9:00 PM  LOS: 1 day

## 2012-03-23 NOTE — Progress Notes (Addendum)
Room 1328 - [name] - [facility] RN Visit-R.Mazen Marcin RN  Related admission to [facility] diagnosis of parkinson's.  Pt is DNR code with OOF DNR on shadow chart.    Pt alert & oriented, lying in bed, with no complaints of pain or discomfort.    No family present.  Pt's wife in [place] ED with dtr (that lives with them) due to an asthma attack.  Spoke with wife in the ED Rm 6 - Goals for pt per wife are to #1) be at home as long as possible and consider BP only at EOL. #2) avoid hospitalizations  #3) treat infections such as UTI and PNA.    Wife states pt has been coughing, choking over the past week and his speech has changed to be slurred in the past week.  Pt was diagnosed with parkinson's about 6 years ago.  Wife states they are supported by [facility].    At discharge, [facility] will order a new hospital bed (current one is not long enough for pt) and they are requesting hospice aide services 5 days per week.   Patient's home medication list is on shadow chart.   Please call [facility] @ [phone]- ask for RN Liaison or after hours,ask for on-call RN with any hospice needs.    Thank you.  Joneen Boers, RN  [facility]  Hospice Liaison

## 2012-03-23 NOTE — ED Provider Notes (Signed)
Medical screening examination/treatment/procedure(s) were performed by non-physician practitioner and as supervising physician I was immediately available for consultation/collaboration.  Sunnie Nielsen, MD 03/23/12 [phone]

## 2012-03-23 NOTE — ED Notes (Signed)
Bladder scan performed. 256 ML in bladder

## 2012-03-24 DIAGNOSIS — I200 Unstable angina: Secondary | ICD-10-CM

## 2012-03-24 MED ORDER — ALUM & MAG HYDROXIDE-SIMETH 200-200-20 MG/5ML PO SUSP
30.0000 mL | ORAL | Status: DC | PRN
  Administered 2012-03-24: 30 mL via ORAL
  Filled 2012-03-24: qty 30

## 2012-03-24 MED ORDER — ISOSORBIDE MONONITRATE ER 30 MG PO TB24
30.0000 mg | ORAL_TABLET | Freq: Every day | ORAL | Status: DC
  Administered 2012-03-25 – 2012-03-27 (×3): 30 mg via ORAL
  Filled 2012-03-24 (×3): qty 1

## 2012-03-24 MED ORDER — LORAZEPAM 2 MG/ML IJ SOLN
1.0000 mg | INTRAMUSCULAR | Status: DC | PRN
  Administered 2012-03-24 (×2): 1 mg via INTRAVENOUS
  Filled 2012-03-24: qty 1

## 2012-03-24 MED ORDER — ISOSORBIDE MONONITRATE 15 MG HALF TABLET
15.0000 mg | ORAL_TABLET | Freq: Once | ORAL | Status: AC
  Administered 2012-03-24: 15 mg via ORAL
  Filled 2012-03-24: qty 1

## 2012-03-24 MED ORDER — MORPHINE SULFATE (CONCENTRATE) 10 MG /0.5 ML PO SOLN
5.0000 mg | ORAL | Status: DC | PRN
  Administered 2012-03-24: 10 mg via ORAL
  Filled 2012-03-24: qty 0.5

## 2012-03-24 MED ORDER — LORAZEPAM 2 MG/ML IJ SOLN
INTRAMUSCULAR | Status: AC
  Filled 2012-03-24: qty 1

## 2012-03-24 NOTE — Progress Notes (Signed)
[facility] ([facility])  Homecare SW visit. Patient was sitting up in bed; wife stated he was having an angina attack. Patient had received some medication but stated pain was still at a 10. Pain level did go down to a 5 within 10 min but patient stated pain increased back to a 9 and patient received more medication. Patient then stated he had no pain. Dr. Malachi Bonds came and talked with patient and wife about goals of care and how aggressively to treat patient symptoms. Patient and wife agreed that no aggressive treatment for heart was desired and patient did not want to return to the hospital for treatment of UTI or pneumonia. Patient would like to return home with Hospice providing comfort care. Wife stated she would honor patient's wishes. Per Merita Norton, new hospital bed is being ordered and will be delivered when patient is discharged. SW will notify [facility] that Hospice Aides services are desired for 5x per week. [facility] Chaplain entered room at end of visit. Please call [facility] at [phone] with any questions or concerns. Vonna Kotyk, LCSWA ext. [phone]

## 2012-03-24 NOTE — Progress Notes (Signed)
Offered support; spouse getting ready to shave patient. Will follow up.  Presence: listening.

## 2012-03-24 NOTE — Evaluation (Signed)
Physical Therapy Evaluation Patient Details Name: [name] MRN: [id number] DOB: March 11, 1940 Today's Date: 03/24/2012 Time: 4098-1191 PT Time Calculation (min): 27 min  PT Assessment / Plan / Recommendation Clinical Impression  72 yo male with parkinson's admitted with UTI.  He demonstrated mobility impairments consistent with Parkinson's diagnosis and requires mod to total assist for mobility.  He is able to walk when assist to standing position. Pt will benefit from continued PT if available to improve mobility to baseline level and decrease burden of care    PT Assessment  Patient needs continued PT services    Follow Up Recommendations  Supervision/Assistance - 24 hour;Home health PT;SNF    Does the patient have the potential to tolerate intense rehabilitation      Barriers to Discharge        Equipment Recommendations  None recommended by PT    Recommendations for Other Services     Frequency Min 3X/week    Precautions / Restrictions Precautions Precautions: Fall Restrictions Weight Bearing Restrictions: No   Pertinent Vitals/Pain No c/o pain      Mobility  Bed Mobility Bed Mobility: Rolling Right;Rolling Left;Right Sidelying to Sit Rolling Right: 1: +2 Total assist Rolling Right: Patient Percentage: 20% Rolling Left: 1: +2 Total assist Rolling Left: Patient Percentage: 20% Right Sidelying to Sit: 1: +2 Total assist Right Sidelying to Sit: Patient Percentage: 20% Details for Bed Mobility Assistance: pt has difficulty with rolling and mobiltiy.  Pad used to assist in rolling   Transfers Transfers: Sit to Stand;Stand to Sit Sit to Stand: 1: +2 Total assist Sit to Stand: Patient Percentage: 50% Stand to Sit: 1: +2 Total assist Stand to Sit: Patient Percentage: 50% Details for Transfer Assistance: pt unable to move hands from armrests to walker and return to asssit with transistion form sit to stand to sit. Ambulation/Gait Ambulation/Gait Assistance: 3:  Mod assist Ambulation Distance (Feet): 80 Feet (40  x 2  with sitting rest between.) Assistive device: Rolling walker Ambulation/Gait Assistance Details: Pt needs assist to guide RW, cues to stand erect, assist to keep speed controlled Gait Pattern: Shuffle;Decreased step length - right;Decreased step length - left;Trunk flexed;Narrow base of support Gait velocity: implusive  General Gait Details: Pt pushes RW too far out in front of him, stays flexed and moves impulsively Stairs: No Wheelchair Mobility Wheelchair Mobility: No    Shoulder Instructions     Exercises General Exercises - Lower Extremity Ankle Circles/Pumps: PROM;Both;5 reps;Supine Hip ABduction/ADduction: PROM;Right;10 reps;Sidelying (ROM to assist with hygiene after BM in sidelying) Hip Flexion/Marching: PROM;Right;10 reps;Sidelying   PT Diagnosis: Difficulty walking;Abnormality of gait;Generalized weakness  PT Problem List: Decreased strength;Decreased activity tolerance;Decreased mobility;Decreased coordination;Decreased safety awareness;Decreased balance;Decreased range of motion PT Treatment Interventions: DME instruction;Gait training;Functional mobility training;Therapeutic activities;Balance training;Therapeutic exercise;Patient/family education   PT Goals Acute Rehab PT Goals PT Goal Formulation: Patient unable to participate in goal setting Time For Goal Achievement: 04/07/12 Potential to Achieve Goals: Fair Pt will go Supine/Side to Sit: with min assist PT Goal: Supine/Side to Sit - Progress: Goal set today Pt will go Sit to Supine/Side: with min assist PT Goal: Sit to Supine/Side - Progress: Goal set today Pt will go Sit to Stand: with min assist PT Goal: Sit to Stand - Progress: Goal set today Pt will go Stand to Sit: with min assist PT Goal: Stand to Sit - Progress: Goal set today Pt will Ambulate: 51 - 150 feet;with min assist;with least restrictive assistive device PT Goal: Ambulate - Progress: Goal  set today  Visit Information  Last PT Received On: 03/24/12 Assistance Needed: +2    Subjective Data  Subjective: 'I want to walk" Patient Stated Goal: to go back to bed after walking   Prior Functioning  Home Living Lives With: Spouse Available Help at Discharge: Family;Other (Comment) (Hospice services at home) Type of Home: House Prior Function Level of Independence: Needs assistance (per chart pt was walking a few days prior to admission) Communication Communication: Expressive difficulties;Other (comment) (pt with tremors impairing speech)    Cognition  Arousal/Alertness: Awake/alert Behavior During Session: WFL for tasks performed Cognition - Other Comments: Pt with a good sense of humor, interacting with therapist, smiles. He is able to let PT know what he wants to do( walk, go back to bed after walking)    Extremity/Trunk Assessment Right Lower Extremity Assessment RLE ROM/Strength/Tone: Deficits RLE ROM/Strength/Tone Deficits: bradykinsesia, generalized muscle atrophy, and decreased ROM during function, thought pt is able to achieve nearly full joint extension when lying suping Left Lower Extremity Assessment LLE ROM/Strength/Tone: Deficits LLE ROM/Strength/Tone Deficits: bradykinsesia, generalized muscle atrophy, and decreased ROM during function, thought pt is able to achieve nearly full joint extension when lying supine Trunk Assessment Trunk Assessment: Kyphotic (pt tendency for forward head, but can extend in supine)   Balance Balance Balance Assessed: Yes Static Sitting Balance Static Sitting - Balance Support: Bilateral upper extremity supported;Feet supported Static Sitting - Level of Assistance: 4: Min assist Static Standing Balance Static Standing - Balance Support: Bilateral upper extremity supported;During functional activity Static Standing - Level of Assistance: 4: Min assist  End of Session PT - End of Session Equipment Utilized During Treatment: Gait  belt Activity Tolerance: Patient limited by fatigue Patient left: in bed Nurse Communication: Mobility status  GP   Bayard Hugger. [place], [place] [phone]  03/24/2012, 12:27 PM

## 2012-03-24 NOTE — Progress Notes (Addendum)
Room 1328 - Valentina Lucks - [facility] RN Visit-R.October Peery RN  Related admission to [facility] diagnosis of Parkinson's.  Pt is DNR code.    Pt alert & oriented, sitting upright in bed, with no complaints of pain or discomfort.    No family present.   Pt's head started shaking when he was answering questions,  Stopped during the conversation and then started up again.     Pt's dtr was admitted to [facility] yesterday following an asthma attack - wife had stated she was suffering from exhaustion.  Attending for this pt is skeptical to send pt home in nex 2-3 days - per phone conversation with her - she will take one day at a time to see pt's progress and family's recovery.  Attending MD is recommending SNF until family has recovered.   Patient's home medication list is on shadow chart.   Please call [facility] @ [phone]- ask for RN Liaison or after hours,ask for on-call RN with any hospice needs.    Thank you.  Joneen Boers, RN  [facility]  Hospice Liaison

## 2012-03-24 NOTE — Progress Notes (Signed)
OT Cancellation Note  Patient Details Name: ELBER GALYEAN MRN: [id number] DOB: November 05, 1940   Cancelled Treatment:    Reason Eval/Treat Not Completed: Medical issues which prohibited therapy (Per wife, pt having angina attack. RN aware.) Will check back another day.  Tarissa Kerin A OTR/L [phone] 03/24/2012, 12:17 PM

## 2012-03-24 NOTE — Progress Notes (Signed)
[facility] PROGRESS NOTE  [name]:[id number] DOB: Nov 20, 1940 DOA: 03/22/2012 PCP: Nelwyn Salisbury, MD  Assessment/Plan  #1. Aspiration pneumonia.  Improving, but still with intermittent fevers.  Now on room air - Continue clindamycin -  Monitor for diarrhea  #2. Urinary retention with UTI.  Spoke with wife and patient regarding foley catheter which we have agreed to leave in for now.   -  urine culture was not sent with previous UA that suggested UTI -  Urine culture now -  Continue ceftriaxone  Chest pains:  May be related to anxiety, indigestions, ACS. Discussed typical work up for unstable angina with family and the family would like to purse comfort measures at this time.   -  Will increase imdur as tolerated -   Add maalox  -  Continue protonix -  Increase morphine -  Continue ativan at current dose for now  #3. Adult failure to thrive.  #4. CAD status post CABG. Possibly having symptoms of unstable angina  #5. Atrial fibrillation presently rate controlled not a candidate for Coumadin.  #6. Parkinson's disease.  #7. Dementia.  Urinary retention:  Discussed risk of recurrent UTI, urosepsis, and death with family and they would like to leave the foley in for comfort despite these risks.   -  D/C flomax.    Diet:  palliative Access:  piv IVF:  NS at 65ml/h Proph:  SCDs   Code Status: DNR/DNI Family Communication:  Plan to continue antibiotics and fluids for now and palliative diet.  Spoke with patient and wife today about his goals of care and there are people at home to take care of him.  They would not like to bring him back to the hospital and will care for him at home until they are unable, at which time he will transfer to inpatient hospice.  Will continue current therapies and increase imdur and pain medications to control symptoms, particularly chest pain.  Agreed to leave foley in for comfort.   Disposition Plan:  PT recommending home PT.  (order for  PT and aid placed).  OT has not yet assessed patient.     Consultants:  none  Procedures:  none  Antibiotics:  clinda 1/30 >>  Ceftriaxone 1/30 >>   HPI/Subjective: Several episodes of 10/10 substernal chest pain with shortness of breath and nausea today.  Last about 30 minutes and improve with NTG and morphine.  He has had two episodes today and it has been a long time since he has had angina like symptoms.  Denies constipation and diarrhea.  He has a foley catheter in place.  Denies dysuria or problems with foley catheter.    Objective: Filed Vitals:   03/22/12 2344 03/23/12 0437 03/24/12 0458 03/24/12 1056  BP: 110/73 118/67 114/55 117/62  Pulse: 80 85 79   Temp: 97.5 F (36.4 C) 97.7 F (36.5 C) 100.2 F (37.9 C)   TempSrc: Oral Oral Axillary   Resp: 14 16 16    Height:  6' 3.2" (1.91 m)    Weight:  98.1 kg (216 lb 4.3 oz)    SpO2: 98% 93% 95%     Intake/Output Summary (Last 24 hours) at 03/24/12 1531 Last data filed at 03/24/12 1416  Gross per 24 hour  Intake   1080 ml  Output   1702 ml  Net   -622 ml   Filed Weights   03/23/12 0437  Weight: 98.1 kg (216 lb 4.3 oz)    Exam:  General:  Caucasian male, no acute distress, sitting in bed    HEENT:  MMM  Cardiovascular:  RRR, 2/6 soft systolic murmur at the LSB, 2+ pulses  Respiratory:  No wheezes or focal rales or rhonchi today   Abdomen:  NABS, soft, nondistended, nontender, no organomegaly  MSK:  Normal tone and bulk  Neuro:  Full body tremor, grossly intact, no focal deficits  Data Reviewed: Basic Metabolic Panel:  Lab 03/22/12 1610  NA 140  K 4.8  CL 102  CO2 24  GLUCOSE 105*  BUN 16  CREATININE 1.38*  CALCIUM 8.9  MG --  PHOS --   Liver Function Tests:  Lab 03/22/12 2343  AST 18  ALT 9  ALKPHOS 79  BILITOT 0.5  PROT 7.3  ALBUMIN 3.7   No results found for this basename: LIPASE:5,AMYLASE:5 in the last 168 hours No results found for this basename: AMMONIA:5 in the last 168  hours CBC:  Lab 03/22/12 2343  WBC 7.9  NEUTROABS --  HGB 10.7*  HCT 35.4*  MCV 80.1  PLT 209   Cardiac Enzymes:  Lab 03/22/12 2343  CKTOTAL --  CKMB --  CKMBINDEX --  TROPONINI <0.30   BNP (last 3 results)  Basename 03/08/12 1248 02/04/12 1916 10/05/11 0508  PROBNP 453.3* 320.1* 534.4*   CBG: No results found for this basename: GLUCAP:5 in the last 168 hours  No results found for this or any previous visit (from the past 240 hour(s)).   Studies: Dg Chest 2 View  03/23/2012  *RADIOLOGY REPORT*  Clinical Data: Cough, weakness.  CHEST - 2 VIEW  Comparison: 03/08/2012  Findings: Degraded by unconventional position and hypoaeration. Prominent cardiomediastinal contours.  Status post median sternotomy, valve replacement, and CABG. There are bibasilar opacities, increased on the left and mildly decreased on the right. Increased interstitial markings.  Small effusions suggested.  No pneumothorax.  No acute osseous finding.  Osteopenia.  IMPRESSION: Prominent cardiomediastinal contours and bibasilar opacities; atelectasis versus pneumonia.  Hypoaeration with interstitial crowding.  Interstitial edema not excluded.   Original Report Authenticated By: Jearld Lesch, M.D.     Scheduled Meds:    . amiodarone  200 mg Oral q morning - 10a  . amLODipine  2.5 mg Oral Daily  . antiseptic oral rinse  15 mL Mouth Rinse q12n4p  . aspirin EC  325 mg Oral q morning - 10a  . carbidopa-levodopa  1 tablet Oral TID  . cefTRIAXone (ROCEPHIN)  IV  1 g Intravenous Q24H  . chlorhexidine  15 mL Mouth Rinse BID  . clindamycin (CLEOCIN) IV  600 mg Intravenous Q8H  . docusate sodium  100 mg Oral BID  . furosemide  20 mg Oral q morning - 10a  . heparin  5,000 Units Subcutaneous Q8H  . hydrocortisone cream  1 application Topical BID  . isosorbide mononitrate  30 mg Oral Daily  . levothyroxine  75 mcg Oral q morning - 10a  . LORazepam      . LORazepam  0.5-1 mg Oral Q6H  . pantoprazole  40 mg Oral  BID  . primidone  25 mg Oral BID  . risperiDONE  1 mg Oral QPM  . simvastatin  20 mg Oral q1800   Continuous Infusions:    . sodium chloride 50 mL/hr at 03/24/12 1416    Principal Problem:  *Aspiration pneumonia Active Problems:  PARKINSON'S DISEASE  Urinary retention  UTI (lower urinary tract infection)    Time spent: 30 min  Renae Fickle  [facility] Pager [phone]. If 8PM-8AM, please contact night-coverage at [email or web address], password [facility] 03/24/2012, 3:31 PM  LOS: 2 days

## 2012-03-25 DIAGNOSIS — R072 Precordial pain: Secondary | ICD-10-CM

## 2012-03-25 DIAGNOSIS — R1319 Other dysphagia: Secondary | ICD-10-CM

## 2012-03-25 NOTE — Progress Notes (Signed)
[facility] PROGRESS NOTE  [name] ZOX:[id number] DOB: 1940/03/30 DOA: 03/22/2012 PCP: Nelwyn Salisbury, MD  Assessment/Plan  #1. Aspiration pneumonia.  Improving, afebrile currently.  Now on room air - Continue clindamycin -  Monitor for diarrhea  #2. Urinary retention with UTI.  Spoke with wife and patient regarding foley catheter which we have agreed to leave in for now.   -  urine culture was not sent with previous UA that suggested UTI -  Urine culture pending -  Continue ceftriaxone  Chest pains:  May be related to anxiety, indigestions, ACS. Discussed typical work up for unstable angina with family and the family would like to purse comfort measures at this time.   -  Continue increased imdur  -   Continue maalox  -  Continue protonix -  Increase morphine -  Continue ativan at current dose for now  #3. Adult failure to thrive.  #4. CAD status post CABG. Possibly having symptoms of unstable angina  #5. Atrial fibrillation presently rate controlled not a candidate for Coumadin.  #6. Parkinson's disease.  #7. Dementia.  Urinary retention:  Discussed risk of recurrent UTI, urosepsis, and death with family and they would like to leave the foley in for comfort despite these risks.   -  Continue foley  Diet:  palliative Access:  piv IVF:  NS at 22ml/h Proph:  SCDs   Code Status: DNR/DNI Family Communication:  Plan to continue antibiotics and fluids for now and palliative diet.  Spoke with patient and wife today about his goals of care and there are people at home to take care of him.  They would not like to bring him back to the hospital and will care for him at home until they are unable, at which time he will transfer to inpatient hospice.  Will continue current therapies and increase imdur and pain medications to control symptoms, particularly chest pain.  Agreed to leave foley in for comfort.   Disposition Plan:  PT recommending home PT.  (order for PT and aid  placed).  OT has not yet assessed patient.  Plan for discharge home with home hospice services on Monday if continuing to improve   Consultants:  none  Procedures:  none  Antibiotics:  clinda 1/30 >>  Ceftriaxone 1/30 >>   HPI/Subjective: No further episodes of chest pain since yesterday.   Denies constipation and diarrhea and had 2 BM yesterday.  He has a foley catheter in place.  Denies dysuria or problems with foley catheter.  Denies N/V.  Eating some.    Objective: Filed Vitals:   03/23/12 0437 03/24/12 0458 03/24/12 1056 03/25/12 0550  BP: 118/67 114/55 117/62 101/56  Pulse: 85 79  68  Temp: 97.7 F (36.5 C) 100.2 F (37.9 C)  98.2 F (36.8 C)  TempSrc: Oral Axillary  Axillary  Resp: 16 16  20   Height: 6' 3.2" (1.91 m)     Weight: 98.1 kg (216 lb 4.3 oz)     SpO2: 93% 95%  93%    Intake/Output Summary (Last 24 hours) at 03/25/12 1721 Last data filed at 03/25/12 1300  Gross per 24 hour  Intake    370 ml  Output   1715 ml  Net  -1345 ml   Filed Weights   03/23/12 0437  Weight: 98.1 kg (216 lb 4.3 oz)    Exam:   General:  Caucasian male, no acute distress, sitting in bed, smiling and pleasant  HEENT:  MMM  Cardiovascular:  RRR, 2/6 soft systolic murmur at the LSB, 2+ pulses  Respiratory:  No wheezes or focal rales or rhonchi today   Abdomen:  NABS, soft, nondistended, nontender, no organomegaly  MSK:  Normal tone and bulk  Neuro:  No tremor today, grossly intact, no focal deficits  GU:  Foley with clear yellow urine  Data Reviewed: Basic Metabolic Panel:  Lab 03/22/12 1096  NA 140  K 4.8  CL 102  CO2 24  GLUCOSE 105*  BUN 16  CREATININE 1.38*  CALCIUM 8.9  MG --  PHOS --   Liver Function Tests:  Lab 03/22/12 2343  AST 18  ALT 9  ALKPHOS 79  BILITOT 0.5  PROT 7.3  ALBUMIN 3.7   No results found for this basename: LIPASE:5,AMYLASE:5 in the last 168 hours No results found for this basename: AMMONIA:5 in the last 168  hours CBC:  Lab 03/22/12 2343  WBC 7.9  NEUTROABS --  HGB 10.7*  HCT 35.4*  MCV 80.1  PLT 209   Cardiac Enzymes:  Lab 03/22/12 2343  CKTOTAL --  CKMB --  CKMBINDEX --  TROPONINI <0.30   BNP (last 3 results)  Basename 03/08/12 1248 02/04/12 1916 10/05/11 0508  PROBNP 453.3* 320.1* 534.4*   CBG: No results found for this basename: GLUCAP:5 in the last 168 hours  No results found for this or any previous visit (from the past 240 hour(s)).   Studies: No results found.  Scheduled Meds:    . amiodarone  200 mg Oral q morning - 10a  . amLODipine  2.5 mg Oral Daily  . antiseptic oral rinse  15 mL Mouth Rinse q12n4p  . aspirin EC  325 mg Oral q morning - 10a  . carbidopa-levodopa  1 tablet Oral TID  . cefTRIAXone (ROCEPHIN)  IV  1 g Intravenous Q24H  . chlorhexidine  15 mL Mouth Rinse BID  . clindamycin (CLEOCIN) IV  600 mg Intravenous Q8H  . docusate sodium  100 mg Oral BID  . furosemide  20 mg Oral q morning - 10a  . heparin  5,000 Units Subcutaneous Q8H  . hydrocortisone cream  1 application Topical BID  . isosorbide mononitrate  30 mg Oral Daily  . levothyroxine  75 mcg Oral q morning - 10a  . LORazepam  0.5-1 mg Oral Q6H  . pantoprazole  40 mg Oral BID  . primidone  25 mg Oral BID  . risperiDONE  1 mg Oral QPM  . simvastatin  20 mg Oral q1800   Continuous Infusions:    . sodium chloride 50 mL/hr at 03/25/12 0454    Principal Problem:  *Aspiration pneumonia Active Problems:  PARKINSON'S DISEASE  Urinary retention  UTI (lower urinary tract infection)    Time spent: 30 min    Noel Rodier, [facility]  [facility] Pager [phone]. If 8PM-8AM, please contact night-coverage at [email or web address], password [facility] 03/25/2012, 5:21 PM  LOS: 3 days

## 2012-03-25 NOTE — Progress Notes (Signed)
DNR.  Related admission.  Hospice Dx Parkinson's Disease.  Patient sitting up in the bed with eyes closed.  Awakens easily.  Denies any pain.  No family present at bedside at this time.  Spoke with Celine Mans, RN for patient and chart reviewed.  Please call [facility] [phone] with any questions or concerns.  Willette Pa, RN [facility]

## 2012-03-26 DIAGNOSIS — R5381 Other malaise: Secondary | ICD-10-CM

## 2012-03-26 LAB — URINE CULTURE: Colony Count: NO GROWTH

## 2012-03-26 NOTE — Discharge Summary (Signed)
Physician Discharge Summary  [name] NFA:[id number] DOB: 03/30/1940 DOA: 03/22/2012  PCP: Wayne Salisbury, MD  Admit date: 03/22/2012 Discharge date: 03/27/2012  Recommendations for Outpatient Follow-up:  1. Home health PT/OT to continue to improve strength and coordination and retain some independence so that he may stay at home.  Ind:  Parkinson's ds.   2. Home health hospice services 3. Continue clindamycin 300mg  four times daily through 2/5.    Discharge Diagnoses:  Principal Problem:  *Aspiration pneumonia Active Problems:  PARKINSON'S DISEASE  Urinary retention  UTI (lower urinary tract infection)   Discharge Condition: stable, improved  Diet recommendation:   dysphagia 2 with nectar thick liquids  Wt Readings from Last 3 Encounters:  03/23/12 98.1 kg (216 lb 4.3 oz)  02/06/12 100.472 kg (221 lb 8 oz)  02/02/12 106.595 kg (235 lb)    History of present illness:   72 year-old male with history of Parkinson's disease, CAD status post CABG, dementia, atrial fibrillation, mitral valve repair, hypertension who has been under hospice care for last 2 days started developing some chest pain yesterday around 10:30. Patient was given nitroglycerin sublingual and wife called the hospice team and was instructed to give morphine after which patient's chest pain improved and resolved. Around noontime after lunch patient started coughing a lot with choking-like episodes. His cough continued and in addition patient was found to be very weak. He usually ambulates but over the last 2 days he has not done well with ambulation. Patient was brought to the ER chest x-ray showed possibilities of pneumonia. Patient also has not urinated well and bladder scan done in the ER showed more than 300 cc residual and a Foley catheter was placed after which urine drain and UA shows features of UTI. At this time patient has been admitted for further management.   Patient's wife at this time has said that she  wants her husband be admitted as he has deteriorated and is not expecting any aggressive measures.   Hospital Course:   #1. Aspiration pneumonia.  [name] has known aspiration problems, but has chosen a palliative diet and recently enrolled in hospice.  He continued a dysphagia 2 diet with thickened liquids.  He initially required nasal canula and had intermittent low-grade fevers for 2-3 days which trended down on clindamycin and ceftriaxone (latter abx for UTI).  He was able to wean to room air on 2/1.  He should complete a 7 day course of antibiotics.    #2. Urinary retention with UTI. Spoke with wife and patient regarding foley catheter which we have agreed to leave in for now.  - urine culture was not sent with previous UA that suggested UTI  - Repeat urine culture after several days of antibiotics was negative - Ceftriaxone was discontinued.  Chest pains: May be related to anxiety, indigestions, ACS. Discussed typical work up for unstable angina with family and the family would like to purse comfort measures at this time.  He is on a low dose CCB. No chest pain in several days  - increased imdur from 15 to 30mg  - Continue maalox  - Continue protonix  - Continue morphine  - Continue ativan   #3. Adult failure to thrive.  #4. CAD status post CABG. Possibly having symptoms of unstable angina, see above #5. Atrial fibrillation presently rate controlled not a candidate for Coumadin.  #6. Parkinson's disease.  #7. Dementia.  #8.  Urinary retention: Discussed risk of recurrent UTI, urosepsis, and death with family  and they would like to leave the foley in for comfort despite these risks.  - Continue foley  Consultants:  none Procedures:  none Antibiotics:  clinda 1/30 >> 2/5 Ceftriaxone 1/30 >> 2/3  Discharge Exam: Filed Vitals:   03/27/12 0640  BP: 97/56  Pulse: 70  Temp: 98.5 F (36.9 C)  Resp: 16   Filed Vitals:   03/24/12 1056 03/25/12 0550 03/26/12 0458 03/27/12  0640  BP: 117/62 101/56 106/52 97/56  Pulse:  68 70 70  Temp:  98.2 F (36.8 C) 99.6 F (37.6 C) 98.5 F (36.9 C)  TempSrc:  Axillary Axillary Oral  Resp:  20 16 16   Height:      Weight:      SpO2:  93% 94% 96%   Patient states that he is feeling well.  Denies cough, SOB, CP, N/V/D/C.    General: Caucasian male, no acute distress, sitting in chair, smiling and pleasant and conversant HEENT: MMM  Cardiovascular: RRR, 2/6 blowing systolic murmur at the LSB, 2+ pulses  Respiratory: No wheezes or focal rales or rhonchi today  Abdomen: NABS, soft, nondistended, nontender, no organomegaly  MSK: Normal tone and bulk, trace bilateral lower extremity edema Neuro: Mild tremor today, grossly intact, no focal deficits  GU: Foley with clear yellow urine  Discharge Instructions      Discharge Orders    Future Appointments: Provider: Department: Dept Phone: Center:   04/26/2012 2:00 PM Wayne Bunting, MD [facility] [place] [phone] [id number]     Future Orders Please Complete By Expires   Diet general      Comments:   Dysphagia 2 with nectar thick liquids   Increase activity slowly      Call MD for:  temperature >100.4      Call MD for:  persistant nausea and vomiting      Call MD for:  severe uncontrolled pain      Call MD for:  difficulty breathing, headache or visual disturbances      Call MD for:  hives      Call MD for:  persistant dizziness or light-headedness      Call MD for:  extreme fatigue      (HEART FAILURE PATIENTS) Call MD:  Anytime you have any of the following symptoms: 1) 3 pound weight gain in 24 hours or 5 pounds in 1 week 2) shortness of breath, with or without a dry hacking cough 3) swelling in the hands, feet or stomach 4) if you have to sleep on extra pillows at night in order to breathe.      Discharge instructions      Comments:   You were hospitalized with aspiration pneumonia, dehydration, and a urinary tract infection.  You have  received IV fluids and antibiotics and have improved.  Please continue taking clindamycin four times a day until gone.  Please sit upright for all meals and take small bites to minimize the risk of aspiration.  You are not eating and drinking very much right now, so please stop your lasix to prevent dehydration.  Because of your chest pains, your imdur dose was increased slightly.  You decided to keep your foley catheter with the known risk of urinary tract infection so I have stopped your flomax.  If you would like to stop your foley, please make sure you restart your flomax!       Medication List     As of 03/27/2012 11:17 AM    STOP taking  these medications         furosemide 20 MG tablet   Commonly known as: LASIX      Tamsulosin HCl 0.4 MG Caps   Commonly known as: FLOMAX      TAKE these medications         alum & mag hydroxide-simeth 200-200-20 MG/5ML suspension   Commonly known as: MAALOX/MYLANTA   Take 10 mLs by mouth 2 (two) times daily as needed. For constipation      amiodarone 200 MG tablet   Commonly known as: PACERONE   Take 200 mg by mouth every morning.      amLODipine 2.5 MG tablet   Commonly known as: NORVASC   Take 1 tablet (2.5 mg total) by mouth daily.      aspirin EC 325 MG tablet   Take 325 mg by mouth every morning.      bisacodyl 5 MG EC tablet   Commonly known as: DULCOLAX   Take 2 tablets (10 mg total) by mouth daily as needed.      carbidopa-levodopa 25-100 MG per tablet   Commonly known as: SINEMET IR   Take 1 tablet by mouth 3 (three) times daily.      clindamycin 300 MG capsule   Commonly known as: CLEOCIN   Take 1 capsule (300 mg total) by mouth 4 (four) times daily.      clonazePAM 1 MG tablet   Commonly known as: KLONOPIN   Take 1 tablet (1 mg total) by mouth every 6 (six) hours.      DSS 100 MG Caps   Take 100 mg by mouth 2 (two) times daily.      hydrocortisone cream 1 %   Apply 1 application topically 2 (two) times daily.       isosorbide mononitrate 30 MG 24 hr tablet   Commonly known as: IMDUR   Take 1 tablet (30 mg total) by mouth every morning.      levothyroxine 75 MCG tablet   Commonly known as: SYNTHROID, LEVOTHROID   Take 75 mcg by mouth every morning.      LORazepam 0.5 MG tablet   Commonly known as: ATIVAN   Take 1-2 tablets (0.5-1 mg total) by mouth every 6 (six) hours.      morphine 20 MG/ML concentrated solution   Commonly known as: ROXANOL   Take 0.25 mLs (5 mg total) by mouth every 2 (two) hours as needed. For pain      multivitamin with minerals Tabs   Take 1 tablet by mouth every morning.      nitroGLYCERIN 0.4 MG SL tablet   Commonly known as: NITROSTAT   Place 0.4 mg under the tongue every 5 (five) minutes as needed. For chest pain      pantoprazole 40 MG tablet   Commonly known as: PROTONIX   Take 40 mg by mouth 2 (two) times daily.      polyethylene glycol packet   Commonly known as: MIRALAX / GLYCOLAX   Take 17 g by mouth daily as needed. For constipation      pravastatin 40 MG tablet   Commonly known as: PRAVACHOL   Take 40 mg by mouth every evening.      primidone 50 MG tablet   Commonly known as: MYSOLINE   Take 25 mg by mouth 2 (two) times daily.      risperiDONE 1 MG tablet   Commonly known as: RISPERDAL   Take 1 mg by mouth every  evening.      temazepam 30 MG capsule   Commonly known as: RESTORIL   Take 30 mg by mouth at bedtime as needed. For sleep         Follow-up Information    Schedule an appointment as soon as possible for a visit with Wayne Salisbury, MD. (As needed)    Contact information:   [address] Christena Flake [facility] [place] [address] [phone]           The results of significant diagnostics from this hospitalization (including imaging, microbiology, ancillary and laboratory) are listed below for reference.    Significant Diagnostic Studies: Dg Chest 2 View  03/23/2012  *RADIOLOGY REPORT*  Clinical Data: Cough, weakness.  CHEST - 2 VIEW   Comparison: 03/08/2012  Findings: Degraded by unconventional position and hypoaeration. Prominent cardiomediastinal contours.  Status post median sternotomy, valve replacement, and CABG. There are bibasilar opacities, increased on the left and mildly decreased on the right. Increased interstitial markings.  Small effusions suggested.  No pneumothorax.  No acute osseous finding.  Osteopenia.  IMPRESSION: Prominent cardiomediastinal contours and bibasilar opacities; atelectasis versus pneumonia.  Hypoaeration with interstitial crowding.  Interstitial edema not excluded.   Original Report Authenticated By: Jearld Lesch, M.D.    Dg Chest 2 View  03/08/2012  *RADIOLOGY REPORT*  Clinical Data: Weakness, chest pain, shortness of breath.  CHEST - 2 VIEW  Comparison: 02/04/2012  Findings: 1320 hours.  The patient is rotated to the right.  Lung volumes are low with asymmetric elevation of the right hemidiaphragm.  There is bibasilar atelectasis or infiltrate, right greater than left. The cardiopericardial silhouette is enlarged. The patient is status post cardiac valve replacement.  IMPRESSION: No substantial interval change with bibasilar atelectasis or infiltrate.   Original Report Authenticated By: Kennith Center, M.D.     Microbiology: Recent Results (from the past 240 hour(s))  URINE CULTURE     Status: Normal   Collection Time   03/25/12  5:50 AM      Component Value Range Status Comment   Specimen Description URINE, CLEAN CATCH   Final    Special Requests Normal   Final    Culture  Setup Time 03/25/2012 17:28   Final    Colony Count NO GROWTH   Final    Culture NO GROWTH   Final    Report Status 03/26/2012 FINAL   Final      Labs: Basic Metabolic Panel:  Lab 03/22/12 6644  NA 140  K 4.8  CL 102  CO2 24  GLUCOSE 105*  BUN 16  CREATININE 1.38*  CALCIUM 8.9  MG --  PHOS --   Liver Function Tests:  Lab 03/22/12 2343  AST 18  ALT 9  ALKPHOS 79  BILITOT 0.5  PROT 7.3  ALBUMIN 3.7    No results found for this basename: LIPASE:5,AMYLASE:5 in the last 168 hours No results found for this basename: AMMONIA:5 in the last 168 hours CBC:  Lab 03/22/12 2343  WBC 7.9  NEUTROABS --  HGB 10.7*  HCT 35.4*  MCV 80.1  PLT 209   Cardiac Enzymes:  Lab 03/22/12 2343  CKTOTAL --  CKMB --  CKMBINDEX --  TROPONINI <0.30   BNP: BNP (last 3 results)  Basename 03/08/12 1248 02/04/12 1916 10/05/11 0508  PROBNP 453.3* 320.1* 534.4*   CBG: No results found for this basename: GLUCAP:5 in the last 168 hours  Time coordinating discharge: 45 minutes  Signed:  Renae Fickle  [facility]  03/27/2012, 11:17 AM

## 2012-03-26 NOTE — Progress Notes (Signed)
[facility] PROGRESS NOTE  [name]:[id number] DOB: 12/28/1940 DOA: 03/22/2012 PCP: Nelwyn Salisbury, MD  Assessment/Plan  #1. Aspiration pneumonia.  Improving, afebrile currently.  Now on room air - Continue clindamycin -  Monitor for diarrhea  #2. Urinary retention with UTI.  Spoke with wife and patient regarding foley catheter which we have agreed to leave in for now.   -  urine culture was not sent with previous UA that suggested UTI -  Urine culture still pending -  Continue ceftriaxone  Chest pains:  May be related to anxiety, indigestions, ACS. Discussed typical work up for unstable angina with family and the family would like to purse comfort measures at this time.  No chest pain in 2 days -  Continue increased imdur  -   Continue maalox  -  Continue protonix -  Continue morphine -  Continue ativan   #3. Adult failure to thrive.  #4. CAD status post CABG. Possibly having symptoms of unstable angina  #5. Atrial fibrillation presently rate controlled not a candidate for Coumadin.  #6. Parkinson's disease.  #7. Dementia.  Urinary retention:  Discussed risk of recurrent UTI, urosepsis, and death with family and they would like to leave the foley in for comfort despite these risks.   -  Continue foley  Diet:  palliative Access:  piv IVF:  NS at 10ml/h Proph:  SCD[facility]   Code Status: DNR/DNI Family Communication:  Plan to continue antibiotics and fluids and palliative diet today.  Spoke with patient and wife today about his goals of care and there are people at home to take care of him.  They would not like to bring him back to the hospital and will care for him at home until they are unable, at which time he will transfer to inpatient hospice.  Agreed to leave foley in for comfort.   Disposition Plan:  PT recommending home PT.  (order for PT and aid placed).  OT has not yet assessed patient.  Plan for discharge home with home hospice services on Monday.      Consultants:  none  Procedures:  none  Antibiotics:  clinda 1/30 >>  Ceftriaxone 1/30 >>   HPI/Subjective: No further episodes of chest pain in 2 days.   Denies constipation and diarrhea and had 2 BM yesterday.  He has a foley catheter in place.  Denies dysuria or problems with foley catheter.  Denies N/V.  Eating some.    Objective: Filed Vitals:   03/24/12 0458 03/24/12 1056 03/25/12 0550 03/26/12 0458  BP: 114/55 117/62 101/56 106/52  Pulse: 79  68 70  Temp: 100.2 F (37.9 C)  98.2 F (36.8 C) 99.6 F (37.6 C)  TempSrc: Axillary  Axillary Axillary  Resp: 16  20 16   Height:      Weight:      SpO2: 95%  93% 94%    Intake/Output Summary (Last 24 hours) at 03/26/12 1112 Last data filed at 03/26/12 0840  Gross per 24 hour  Intake    918 ml  Output   2425 ml  Net  -1507 ml   Filed Weights   03/23/12 0437  Weight: 98.1 kg (216 lb 4.3 oz)    Exam:   General:  Caucasian male, no acute distress, sitting in bed, smiling and pleasant  HEENT:  MMM  Cardiovascular:  RRR, 2/6 blowing systolic murmur at the LSB, 2+ pulses  Respiratory:  No wheezes or focal rales or rhonchi today   Abdomen:  NABS, soft, nondistended, nontender, no organomegaly  MSK:  Normal tone and bulk  Neuro:  Mild tremor today, grossly intact, no focal deficits  GU:  Foley with clear yellow urine  Data Reviewed: Basic Metabolic Panel:  Lab 03/22/12 1610  NA 140  K 4.8  CL 102  CO2 24  GLUCOSE 105*  BUN 16  CREATININE 1.38*  CALCIUM 8.9  MG --  PHOS --   Liver Function Tests:  Lab 03/22/12 2343  AST 18  ALT 9  ALKPHOS 79  BILITOT 0.5  PROT 7.3  ALBUMIN 3.7   No results found for this basename: LIPASE:5,AMYLASE:5 in the last 168 hours No results found for this basename: AMMONIA:5 in the last 168 hours CBC:  Lab 03/22/12 2343  WBC 7.9  NEUTROABS --  HGB 10.7*  HCT 35.4*  MCV 80.1  PLT 209   Cardiac Enzymes:  Lab 03/22/12 2343  CKTOTAL --  CKMB --   CKMBINDEX --  TROPONINI <0.30   BNP (last 3 results)  Basename 03/08/12 1248 02/04/12 1916 10/05/11 0508  PROBNP 453.3* 320.1* 534.4*   CBG: No results found for this basename: GLUCAP:5 in the last 168 hours  No results found for this or any previous visit (from the past 240 hour(s)).   Studies: No results found.  Scheduled Meds:    . amiodarone  200 mg Oral q morning - 10a  . amLODipine  2.5 mg Oral Daily  . antiseptic oral rinse  15 mL Mouth Rinse q12n4p  . aspirin EC  325 mg Oral q morning - 10a  . carbidopa-levodopa  1 tablet Oral TID  . cefTRIAXone (ROCEPHIN)  IV  1 g Intravenous Q24H  . chlorhexidine  15 mL Mouth Rinse BID  . clindamycin (CLEOCIN) IV  600 mg Intravenous Q8H  . docusate sodium  100 mg Oral BID  . furosemide  20 mg Oral q morning - 10a  . heparin  5,000 Units Subcutaneous Q8H  . hydrocortisone cream  1 application Topical BID  . isosorbide mononitrate  30 mg Oral Daily  . levothyroxine  75 mcg Oral q morning - 10a  . LORazepam  0.5-1 mg Oral Q6H  . pantoprazole  40 mg Oral BID  . primidone  25 mg Oral BID  . risperiDONE  1 mg Oral QPM  . simvastatin  20 mg Oral q1800   Continuous Infusions:    . sodium chloride 50 mL/hr at 03/26/12 0225    Principal Problem:  *Aspiration pneumonia Active Problems:  PARKINSON'S DISEASE  Urinary retention  UTI (lower urinary tract infection)    Time spent: 30 min    Haliey Romberg, [facility]  [facility] Pager [phone]. If 8PM-8AM, please contact night-coverage at [email or web address], password [email or web address] 03/26/2012, 11:12 AM  LOS: 4 days

## 2012-03-26 NOTE — Progress Notes (Signed)
Received call from wife earlier in shift asking who had told patient that he would be going to a nursing home.  Reassured her that staff had not told him that and, in fact, I had pulled up the progress notes and told pt that the plan is for him to return home.  The pt himself is who says that he will be going to a nursing home based on the fact that he is unable to walk.  Wife was reassured and stated that sometimes "he gets something stuck in his head and has a hard time letting go of it."  I also spoke with pt and reiterated that plan is for him to return home after hospitalization.

## 2012-03-27 MED ORDER — CLINDAMYCIN HCL 300 MG PO CAPS: 300.0000 mg | ORAL_CAPSULE | Freq: Four times a day (QID) | ORAL | Status: DC

## 2012-03-27 MED ORDER — MORPHINE SULFATE (CONCENTRATE) 20 MG/ML PO SOLN: 5.0000 mg | ORAL | Status: DC | PRN

## 2012-03-27 MED ORDER — DSS 100 MG PO CAPS: 100.0000 mg | ORAL_CAPSULE | Freq: Two times a day (BID) | ORAL | Status: DC

## 2012-03-27 MED ORDER — LORAZEPAM 0.5 MG PO TABS: 0.5000 mg | ORAL_TABLET | Freq: Four times a day (QID) | ORAL | Status: DC

## 2012-03-27 MED ORDER — BISACODYL 5 MG PO TBEC: 10.0000 mg | DELAYED_RELEASE_TABLET | Freq: Every day | ORAL | Status: DC | PRN

## 2012-03-27 MED ORDER — ISOSORBIDE MONONITRATE ER 30 MG PO TB24: 30.0000 mg | ORAL_TABLET | Freq: Every morning | ORAL | Status: DC

## 2012-03-27 MED ORDER — AMLODIPINE BESYLATE 2.5 MG PO TABS: 2.5000 mg | ORAL_TABLET | Freq: Every day | ORAL | Status: DC

## 2012-03-27 NOTE — Evaluation (Signed)
Occupational Therapy Evaluation Patient Details Name: [name] MRN: [id number] DOB: 05-01-1940 Today's Date: 03/27/2012 Time: 0820-0840 OT Time Calculation (min): 20 min  OT Assessment / Plan / Recommendation Clinical Impression  Pt presents to OT with overall decreased I with ADL activity and will benefit from  skilled OT to increase Iwith ADL activity  and maintain as possible with simple ADL tasks    OT Assessment  Patient needs continued OT Services    Follow Up Recommendations  Home health OT             Frequency  Min 2X/week    Precautions / Restrictions Precautions Precautions: Fall       ADL  Eating/Feeding: Performed;Maximal assistance Grooming: Performed;Brushing hair;Minimal assistance Where Assessed - Grooming: Supported sitting Upper Body Dressing: Simulated;Maximal assistance Where Assessed - Upper Body Dressing: Unsupported sitting Lower Body Dressing: Simulated;+2 Total assistance Lower Body Dressing: Patient Percentage: 50% Where Assessed - Lower Body Dressing: Supported sit to Pharmacist, hospital: +2 Total assistance Toilet Transfer: Patient Percentage: 50% Toilet Transfer Method: Sit to stand    OT Diagnosis: Generalized weakness  OT Problem List: Decreased strength;Decreased range of motion;Decreased coordination;Impaired balance (sitting and/or standing) OT Treatment Interventions: Self-care/ADL training;Patient/family education;Therapeutic activities   OT Goals Acute Rehab OT Goals OT Goal Formulation: With patient Time For Goal Achievement: 04/10/12 Potential to Achieve Goals: Good ADL Goals Pt Will Perform Eating: with min assist;Supported;Sitting, chair ADL Goal: Eating - Progress: Goal set today Pt Will Perform Grooming: with set-up;Sitting, chair;Supported ADL Goal: Grooming - Progress: Goal set today Pt Will Transfer to Toilet: with mod assist;Stand pivot transfer ADL Goal: Toilet Transfer - Progress: Goal set today  Visit  Information  Last OT Received On: 03/27/12    Subjective Data  Subjective: I do need help feeding myself   Prior Functioning     Home Living Lives With: Family Available Help at Discharge: Other (Comment) (hospice) Type of Home: House Home Access: Ramped entrance Home Layout: One level Bathroom Shower/Tub: Engineer, manufacturing systems: Standard Prior Function Level of Independence: Needs assistance Needs Assistance: Bathing;Dressing;Feeding;Toileting Vocation: Retired Musician: No difficulties            Cognition  Arousal/Alertness: Psychologist, occupational During Session: WFL for tasks performed Cognition - Other Comments: Pt was conversive with OT and very pleasant    Extremity/Trunk Assessment Right Upper Extremity Assessment RUE ROM/Strength/Tone: Deficits RUE ROM/Strength/Tone Deficits: Pts BUe overall stiff and do have tremors ( resting and activity ) Pt not able to self feed , but is able to brush hair. Encouraged pt to move BUE as much as was comfortable Left Upper Extremity Assessment LUE ROM/Strength/Tone: Deficits     Mobility Bed Mobility Bed Mobility: Rolling Right;Rolling Left;Right Sidelying to Sit Rolling Right: 1: +2 Total assist Rolling Right: Patient Percentage: 20% Rolling Left: 1: +2 Total assist Rolling Left: Patient Percentage: 20% Right Sidelying to Sit: 1: +2 Total assist Right Sidelying to Sit: Patient Percentage: 20% Details for Bed Mobility Assistance: pt has difficulty with rolling and mobiltiy.  Pad used to assist in rolling   Transfers Transfers: Sit to Stand;Stand to Sit Sit to Stand: 1: +2 Total assist;From bed Sit to Stand: Patient Percentage: 50% Stand to Sit: 1: +2 Total assist;To chair/3-in-1 Stand to Sit: Patient Percentage: 50%              End of Session OT - End of Session Activity Tolerance: Patient tolerated treatment well Patient left: in chair;with call bell/phone within reach  GO      Alba Cory 03/27/2012, 9:07 AM

## 2012-03-27 NOTE — Progress Notes (Addendum)
Room 1328 - Valentina Lucks - [facility] RN Visit-R.Dovie Kapusta RN  Related admission to [facility] diagnosis of Parkinson's.  Pt is DNR code.    Pt alert & oriented, sitting up in bed, with no complaints of pain or discomfort-"feeling good."    No family present.  Pt due to be discharged home with [facility] home care services today.  Discussed discharge with attending Dr. Malachi Bonds - will need Foley care support and new bed delivered prior to discharge.  Discussed with [facility] DME specialist Jewel - Package D ordered with electric bed with extended due to pt's height, full rails and OBT.  This package has been asked to be delivered ASAP - AS PT WILL BE DISCHARGED FOLLOWING DELIVERY AND SET UP.  Discussed with wife via phone - she is expecting [facility] to call to confirm delivery time.   Per pt's wife, their dtr (admitted to 4th floow [facility]) may be discharged today also.   Patient's home medication list is on shadow chart.   Please call [facility] @ [phone]- ask for RN Liaison or after hours,ask for on-call RN with any hospice needs.    Thank you.  Joneen Boers, RN  [facility]  Hospice Liaison

## 2012-03-27 NOTE — Progress Notes (Signed)
Pt is with [facility], who will follow him at home for home health needs.

## 2012-04-11 ENCOUNTER — Other Ambulatory Visit: Payer: Self-pay | Admitting: Family Medicine

## 2012-04-12 ENCOUNTER — Telehealth: Payer: Self-pay | Admitting: Cardiology

## 2012-04-12 MED ORDER — ISOSORBIDE MONONITRATE ER 60 MG PO TB24: 60.0000 mg | ORAL_TABLET | Freq: Every morning | ORAL | Status: DC

## 2012-04-12 NOTE — Telephone Encounter (Signed)
New Problem:    Patient's wife called in wanting to suggest that her husband be placed on a nitro patch due to his more frequent angina attacks.  Please call back.

## 2012-04-12 NOTE — Telephone Encounter (Signed)
Dr Jens Som aware pt at last discharge sent home on 30 mg daily of isosorbide. Per dr Jens Som pt to try 60 mg of isosorbide daily. Script sent to pharm. Home number disconnected at present will cont to try to call.

## 2012-04-12 NOTE — Telephone Encounter (Signed)
Change imdur to 30 mg daily; watch for orthostasis and hypotension Olga Millers

## 2012-04-12 NOTE — Telephone Encounter (Signed)
Spoke with pt wife, the pt is having more frequent angina attacks, 4 in the last two days. She has discussed with the hospice nurse and they wondered if he could get a NTG patch to help with these attacks. Explained to wife that the isosorbide the pt is currently taking is a NTG pill. She voiced understanding but wonders if it can be increased to help. Will forward for dr Jens Som review

## 2012-04-12 NOTE — Telephone Encounter (Signed)
Spoke with pt wife, Aware of dr crenshaw's recommendations.  

## 2012-04-21 ENCOUNTER — Telehealth: Payer: Self-pay | Admitting: Cardiology

## 2012-04-21 NOTE — Telephone Encounter (Signed)
Spoke with pt wife, pt is currently in hospice and is doing much better. He has not had any angina this week. Follow up appt canceled.

## 2012-04-21 NOTE — Telephone Encounter (Signed)
New problem      Wife calling has general question regarding appt on 3/5 .

## 2012-04-26 ENCOUNTER — Ambulatory Visit: Payer: Medicare Other | Admitting: Cardiology

## 2012-05-05 ENCOUNTER — Telehealth: Payer: Self-pay | Admitting: Family Medicine

## 2012-05-05 NOTE — Telephone Encounter (Signed)
Yes that is fine

## 2012-05-05 NOTE — Telephone Encounter (Signed)
[name] from Hospice called. Their doctor visited [name] this week and states pt is depressed and anxious. That doctor put an order in for Zoloft (dosage not given to me) for [name]. The med has not yet been started. They want to know if Dr. [name] is agreeable to this. If not, please call her back.

## 2012-05-11 ENCOUNTER — Telehealth: Payer: Self-pay | Admitting: Family Medicine

## 2012-05-11 NOTE — Telephone Encounter (Signed)
Hospice of  called to inform you: Pt is going to [facility] on Friday for 5 days. Giving wife a break. Wife had a mini stroke and is getting OT and PT.  Pt is declining, and bedridden now,  too weak to stand. This is only to give wife some time  to recoup.Pt is scheduled to return home after the 5 days.

## 2012-05-22 ENCOUNTER — Other Ambulatory Visit: Payer: Self-pay | Admitting: Physician Assistant

## 2012-05-24 ENCOUNTER — Telehealth: Payer: Self-pay | Admitting: *Deleted

## 2012-05-24 NOTE — Telephone Encounter (Signed)
I will be happy to provide medications for up to a year after his last visit ,  than i need to either see him or have another hospice physician take prescriptions over. His last visit with me was approc x. i nov /dec 2013?

## 2012-05-24 NOTE — Telephone Encounter (Signed)
wife is wanting to inform Dr D that patient has been moved to hospice and in order to come in for visits, would need ambulatory transportation, wants to continue to receive his meds from her.

## 2012-05-31 DIAGNOSIS — R269 Unspecified abnormalities of gait and mobility: Secondary | ICD-10-CM

## 2012-05-31 DIAGNOSIS — F329 Major depressive disorder, single episode, unspecified: Secondary | ICD-10-CM

## 2012-05-31 DIAGNOSIS — G20 Parkinson's disease: Secondary | ICD-10-CM

## 2012-05-31 DIAGNOSIS — F0390 Unspecified dementia without behavioral disturbance: Secondary | ICD-10-CM

## 2012-05-31 DIAGNOSIS — I10 Essential (primary) hypertension: Secondary | ICD-10-CM

## 2012-05-31 DIAGNOSIS — F411 Generalized anxiety disorder: Secondary | ICD-10-CM

## 2012-06-05 ENCOUNTER — Other Ambulatory Visit: Payer: Self-pay | Admitting: Neurology

## 2012-06-05 NOTE — Telephone Encounter (Signed)
Called patient, spoke with wife, pt sched

## 2012-06-12 ENCOUNTER — Other Ambulatory Visit: Payer: Self-pay | Admitting: Family Medicine

## 2012-06-19 ENCOUNTER — Telehealth: Payer: Self-pay | Admitting: Family Medicine

## 2012-06-19 NOTE — Telephone Encounter (Signed)
From previous note:  Hospice would like to know if they can stop his amLODipine (NORVASC) 2.5 MG tablet Pls call: Sherry at  [phone]

## 2012-06-19 NOTE — Telephone Encounter (Signed)
To: [facility] (After Hours Triage) Fax: [phone] From: [facility] Date/ Time: 06/18/2012 3:12 PM Taken By: Annalee Genta, CSR Caller: Angelica Chessman Facility: not collected Patient: [name] DOB: 1941-01-19 Phone: [phone] Reason for Call: [facility] -  calling to advise pt was standing w/walker at bedside commode, his legs gave out, fell and hit back on bedrail. Pt had lower back pain, Hospice was called to assess. No obvious signs of injury, will continue to use Morphine and Tylenol in home as needed for pain. Please call with any questions or concerns. Regarding Appointment:

## 2012-06-20 NOTE — Telephone Encounter (Signed)
Yes stop the Amlodipine

## 2012-06-20 NOTE — Telephone Encounter (Signed)
I spoke with Cordelia Pen and gave below information.

## 2012-07-14 ENCOUNTER — Other Ambulatory Visit: Payer: Self-pay | Admitting: Family Medicine

## 2012-07-18 DIAGNOSIS — K5900 Constipation, unspecified: Secondary | ICD-10-CM

## 2012-07-18 DIAGNOSIS — R1310 Dysphagia, unspecified: Secondary | ICD-10-CM

## 2012-07-18 DIAGNOSIS — F411 Generalized anxiety disorder: Secondary | ICD-10-CM

## 2012-07-18 DIAGNOSIS — G20 Parkinson's disease: Secondary | ICD-10-CM

## 2012-07-18 DIAGNOSIS — R5383 Other fatigue: Secondary | ICD-10-CM

## 2012-07-18 DIAGNOSIS — IMO0002 Reserved for concepts with insufficient information to code with codable children: Secondary | ICD-10-CM

## 2012-07-18 DIAGNOSIS — R5381 Other malaise: Secondary | ICD-10-CM

## 2012-07-22 ENCOUNTER — Emergency Department (HOSPITAL_COMMUNITY): Payer: Medicare Other

## 2012-07-22 ENCOUNTER — Emergency Department (HOSPITAL_COMMUNITY)
Admission: EM | Admit: 2012-07-22 | Discharge: 2012-07-23 | Disposition: A | Discharge status: Home / Self Care | Payer: Medicare Other | Attending: Emergency Medicine | Admitting: Emergency Medicine

## 2012-07-22 ENCOUNTER — Encounter (HOSPITAL_COMMUNITY): Payer: Self-pay | Admitting: Family Medicine

## 2012-07-22 DIAGNOSIS — Z8679 Personal history of other diseases of the circulatory system: Secondary | ICD-10-CM | POA: Insufficient documentation

## 2012-07-22 DIAGNOSIS — Z8619 Personal history of other infectious and parasitic diseases: Secondary | ICD-10-CM | POA: Insufficient documentation

## 2012-07-22 DIAGNOSIS — Z9861 Coronary angioplasty status: Secondary | ICD-10-CM | POA: Insufficient documentation

## 2012-07-22 DIAGNOSIS — R072 Precordial pain: Secondary | ICD-10-CM | POA: Insufficient documentation

## 2012-07-22 DIAGNOSIS — Z7982 Long term (current) use of aspirin: Secondary | ICD-10-CM | POA: Insufficient documentation

## 2012-07-22 DIAGNOSIS — Z85841 Personal history of malignant neoplasm of brain: Secondary | ICD-10-CM | POA: Insufficient documentation

## 2012-07-22 DIAGNOSIS — I6350 Cerebral infarction due to unspecified occlusion or stenosis of unspecified cerebral artery: Secondary | ICD-10-CM | POA: Insufficient documentation

## 2012-07-22 DIAGNOSIS — Z79899 Other long term (current) drug therapy: Secondary | ICD-10-CM | POA: Insufficient documentation

## 2012-07-22 DIAGNOSIS — R079 Chest pain, unspecified: Secondary | ICD-10-CM

## 2012-07-22 DIAGNOSIS — Z8669 Personal history of other diseases of the nervous system and sense organs: Secondary | ICD-10-CM | POA: Insufficient documentation

## 2012-07-22 DIAGNOSIS — Z860100 Personal history of colon polyps, unspecified: Secondary | ICD-10-CM | POA: Insufficient documentation

## 2012-07-22 DIAGNOSIS — Z8672 Personal history of thrombophlebitis: Secondary | ICD-10-CM | POA: Insufficient documentation

## 2012-07-22 DIAGNOSIS — F3289 Other specified depressive episodes: Secondary | ICD-10-CM | POA: Insufficient documentation

## 2012-07-22 DIAGNOSIS — E785 Hyperlipidemia, unspecified: Secondary | ICD-10-CM | POA: Insufficient documentation

## 2012-07-22 DIAGNOSIS — Z86711 Personal history of pulmonary embolism: Secondary | ICD-10-CM | POA: Insufficient documentation

## 2012-07-22 DIAGNOSIS — Z86718 Personal history of other venous thrombosis and embolism: Secondary | ICD-10-CM | POA: Insufficient documentation

## 2012-07-22 DIAGNOSIS — F329 Major depressive disorder, single episode, unspecified: Secondary | ICD-10-CM | POA: Insufficient documentation

## 2012-07-22 DIAGNOSIS — Z792 Long term (current) use of antibiotics: Secondary | ICD-10-CM | POA: Insufficient documentation

## 2012-07-22 DIAGNOSIS — Z87891 Personal history of nicotine dependence: Secondary | ICD-10-CM | POA: Insufficient documentation

## 2012-07-22 DIAGNOSIS — F0390 Unspecified dementia without behavioral disturbance: Secondary | ICD-10-CM | POA: Insufficient documentation

## 2012-07-22 DIAGNOSIS — Z87448 Personal history of other diseases of urinary system: Secondary | ICD-10-CM | POA: Insufficient documentation

## 2012-07-22 DIAGNOSIS — J45909 Unspecified asthma, uncomplicated: Secondary | ICD-10-CM | POA: Insufficient documentation

## 2012-07-22 DIAGNOSIS — F411 Generalized anxiety disorder: Secondary | ICD-10-CM | POA: Insufficient documentation

## 2012-07-22 DIAGNOSIS — I2510 Atherosclerotic heart disease of native coronary artery without angina pectoris: Secondary | ICD-10-CM | POA: Insufficient documentation

## 2012-07-22 DIAGNOSIS — K219 Gastro-esophageal reflux disease without esophagitis: Secondary | ICD-10-CM | POA: Insufficient documentation

## 2012-07-22 LAB — CBC WITH DIFFERENTIAL/PLATELET
Basophils Absolute: 0 10*3/uL (ref 0.0–0.1)
Eosinophils Absolute: 0.1 10*3/uL (ref 0.0–0.7)
Eosinophils Relative: 1 % (ref 0–5)
MCH: 25.6 pg — ABNORMAL LOW (ref 26.0–34.0)
MCHC: 31.3 g/dL (ref 30.0–36.0)
MCV: 81.6 fL (ref 78.0–100.0)
Monocytes Absolute: 0.6 10*3/uL (ref 0.1–1.0)
Platelets: 211 10*3/uL (ref 150–400)
RDW: 16.5 % — ABNORMAL HIGH (ref 11.5–15.5)
WBC: 6.7 10*3/uL (ref 4.0–10.5)

## 2012-07-22 LAB — BASIC METABOLIC PANEL
Calcium: 8.9 mg/dL (ref 8.4–10.5)
Creatinine, Ser: 0.86 mg/dL (ref 0.50–1.35)
GFR calc non Af Amer: 85 mL/min — ABNORMAL LOW (ref >90)
Sodium: 135 mEq/L (ref 135–145)

## 2012-07-22 LAB — POCT I-STAT TROPONIN I: Troponin i, poc: 0.01 ng/mL (ref 0.00–0.08)

## 2012-07-22 MED ORDER — SODIUM CHLORIDE 0.9 % IV BOLUS (SEPSIS)
500.0000 mL | Freq: Once | INTRAVENOUS | Status: AC
  Administered 2012-07-22: 500 mL via INTRAVENOUS

## 2012-07-22 MED ORDER — NITROGLYCERIN 0.4 MG SL SUBL: 0.4000 mg | SUBLINGUAL_TABLET | SUBLINGUAL | Status: DC | PRN

## 2012-07-22 MED ORDER — MORPHINE SULFATE 4 MG/ML IJ SOLN: 4.0000 mg | Freq: Once | INTRAMUSCULAR | Status: DC

## 2012-07-22 NOTE — ED Notes (Signed)
Per EMS, pt having chest pain that started about 4:30 pm. sts left chest non radiating. Took nitro and ASA at home with some relief. EMS gave 1 nitro and pt BP dropped to 80 sys. Last BP 92/65. IV est. sts pain 6/10.

## 2012-07-22 NOTE — ED Notes (Signed)
PTAR notified.  ?

## 2012-07-22 NOTE — ED Provider Notes (Signed)
History     CSN: [id number]  Arrival date & time 07/22/12  1819   First MD Initiated Contact with Patient 07/22/12 1832      Chief Complaint  Patient presents with  . Chest Pain    (Consider location/radiation/quality/duration/timing/severity/associated sxs/prior treatment) The history is provided by the patient.  VERN GUERETTE is a 72 y.o. male hx of PE not on coumadin, BPH, dementia, CABG here with chest pain. Substernal chest pain, sense of pressure since 4:30 PM. No radiation and no SOB. Took aspirin and nitro at home and felt slightly better. Came by EMS and was given another nitro en route.   Level V caveat- dementia    Past Medical History  Diagnosis Date  . PE (pulmonary embolism)     a. >20 years ago;  b. nl V:Q scan 10/2010  . DVT (deep venous thrombosis) 01/2007    bilateral  . Meningioma   . BPH (benign prostatic hyperplasia)   . Personal history of colonic polyps   . Venereal wart   . Depression   . Asthma   . Chickenpox   . Thrombophlebitis   . GERD (gastroesophageal reflux disease)   . Parkinson disease   . Dementia   . CAD (coronary artery disease)     a. 08/2009 s/p CABG x 2 (LIMA->LAD, SVG-OM). b. Adm w/ CP 09/2011 - questionable functionality of LIMA graft but patient declined further catheterization (cath 10/05/11 -- ostial LAD disease, possible flap in prox L subclavian/unable to image LIMA well, atretic SVG-OM)  . Hyperlipidemia   . Stricture and stenosis of esophagus   . Diverticulosis of colon (without mention of hemorrhage)   . Mitral regurgitation     a. 08/2009 s/p MV repair 22mm Edwards ring and oversewing of LA appendage @ time of CABG;  b. cardiac MRI 09/2011 showing some disruption of lateral annuloplasty ring with "perivalvular" MR and some central MR   . Thrombus of left atrial appendage     a. Abnormal dye staining on cath 09/2011 - imaged by CT/MRI/TEE - felt to be surgically isolated from the LA  . Anxiety   . Stroke   . Atrial  fibrillation     a. Paroxysmal, not Coumadin candidate 2/2 Parkinson's/gait instability  . Brain tumor     "right side of my brain" (12/22/2011)  . Arthritis     "very little" (12/22/2011)  . Pulmonary embolism 01/2007  . Aspiration pneumonia 03/2008  . Hiatal hernia     PARKINSONS TREMORS    Past Surgical History  Procedure Laterality Date  . Varicose vein surgery      left leg  . Mitral valve repair  08/2009  . Umbilical hernia repair    . Tee without cardioversion  10/06/2011    Procedure: TRANSESOPHAGEAL ECHOCARDIOGRAM (TEE);  Surgeon: Vesta Mixer, MD;  Location: [facility] ENDOSCOPY;  Service: Cardiovascular;  Laterality: N/A;  . Esophagogastroduodenoscopy  11/15/2011    Procedure: ESOPHAGOGASTRODUODENOSCOPY (EGD);  Surgeon: Meryl Dare, MD,FACG;  Location: [facility] ENDOSCOPY;  Service: Endoscopy;  Laterality: N/A;  . Tonsillectomy and adenoidectomy    . Mastoidectomy      "right; I'm really not sure what they did to my mastoid" (12/22/2011)  . Inguinal hernia repair      right  . Lumbar disc surgery      "Dr. Jeral Fruit; herniated disc & remove bone spurs"  . Back surgery    . Coronary artery bypass graft  08/2009    CABG X2  .  Joint replacement      Family History  Problem Relation Age of Onset  . Arthritis    . Diabetes    . Stroke Father   . Heart disease Mother     History  Substance Use Topics  . Smoking status: Former Smoker -- 1.00 packs/day for 10 years    Types: Cigarettes    Quit date: 02/22/1953  . Smokeless tobacco: Never Used  . Alcohol Use: No      Review of Systems  Cardiovascular: Positive for chest pain.  All other systems reviewed and are negative.    Allergies  Ibuprofen  Home Medications   Current Outpatient Rx  Name  Route  Sig  Dispense  Refill  . amiodarone (PACERONE) 200 MG tablet      TAKE 1 TABLET DAILY   30 tablet   4   . amLODipine (NORVASC) 2.5 MG tablet      TAKE 1 TABLET DAILY.   30 tablet   6   . aspirin EC 325 MG  tablet   Oral   Take 325 mg by mouth every morning.          . carbidopa-levodopa (SINEMET IR) 25-100 MG per tablet      TAKE 1 TABLET IN THE MORING, 1 TABLET AT LUNCH, AND 1 TABLET IN THE EVENING   90 tablet   6   . docusate sodium 100 MG CAPS   Oral   Take 100 mg by mouth 2 (two) times daily.   10 capsule   0   . haloperidol (HALDOL) 2 MG tablet   Oral   Take 2 mg by mouth 2 (two) times daily.         . hydrocortisone cream 1 %   Topical   Apply 1 application topically 2 (two) times daily.         . isosorbide mononitrate (IMDUR) 60 MG 24 hr tablet   Oral   Take 1 tablet (60 mg total) by mouth every morning.   30 tablet   12   . levothyroxine (SYNTHROID, LEVOTHROID) 75 MCG tablet   Oral   Take 75 mcg by mouth every morning.          Marland Kitchen LORazepam (ATIVAN) 0.5 MG tablet   Oral   Take 1-2 tablets (0.5-1 mg total) by mouth every 6 (six) hours.   30 tablet   0   . morphine (ROXANOL) 20 MG/ML concentrated solution   Oral   Take 0.25 mLs (5 mg total) by mouth every 2 (two) hours as needed. For pain   30 mL   0   . Multiple Vitamin (MULITIVITAMIN WITH MINERALS) TABS   Oral   Take 1 tablet by mouth every morning.          . nitroGLYCERIN (NITROSTAT) 0.4 MG SL tablet   Sublingual   Place 0.4 mg under the tongue every 5 (five) minutes as needed. For chest pain         . pantoprazole (PROTONIX) 40 MG tablet      TAKE 1 TABLET TWICE A DAY   60 tablet   5   . polyethylene glycol (MIRALAX / GLYCOLAX) packet   Oral   Take 17 g by mouth daily as needed. For constipation         . pravastatin (PRAVACHOL) 40 MG tablet   Oral   Take 40 mg by mouth every evening.          . primidone (  MYSOLINE) 50 MG tablet   Oral   Take 25 mg by mouth 2 (two) times daily.         . risperiDONE (RISPERDAL) 1 MG tablet   Oral   Take 1 mg by mouth every evening.         . tamsulosin (FLOMAX) 0.4 MG CAPS   Oral   Take 0.4 mg by mouth daily.         .  temazepam (RESTORIL) 30 MG capsule   Oral   Take 30 mg by mouth at bedtime as needed. For sleep           BP 103/61  Pulse 75  Temp(Src) 98.2 F (36.8 C) (Oral)  Resp 17  SpO2 98%  Physical Exam  Nursing note and vitals reviewed. Constitutional:  Chronically ill, slightly uncomfortable   HENT:  Head: Normocephalic.  Mouth/Throat: Oropharynx is clear and moist.  Eyes: Conjunctivae are normal. Pupils are equal, round, and reactive to light.  Neck: Normal range of motion. Neck supple.  Cardiovascular: Normal rate, regular rhythm and normal heart sounds.   Pulmonary/Chest: Effort normal and breath sounds normal. No respiratory distress. He has no wheezes. He has no rales. He exhibits no tenderness.  Not reproducible   Abdominal: Soft. Bowel sounds are normal. He exhibits no distension. There is no tenderness. There is no rebound and no guarding.  Musculoskeletal: Normal range of motion. He exhibits no edema and no tenderness.  Neurological: He is alert.  Demented, intentional tremors   Skin: Skin is warm and dry.  Psychiatric: He has a normal mood and affect. His behavior is normal. Judgment and thought content normal.    ED Course  Procedures (including critical care time)  Labs Reviewed  CBC WITH DIFFERENTIAL - Abnormal; Notable for the following:    RBC 4.07 (*)    Hemoglobin 10.4 (*)    HCT 33.2 (*)    MCH 25.6 (*)    RDW 16.5 (*)    All other components within normal limits  BASIC METABOLIC PANEL - Abnormal; Notable for the following:    GFR calc non Af Amer 85 (*)    All other components within normal limits  POCT I-STAT TROPONIN I  POCT I-STAT TROPONIN I   Dg Chest Portable 1 View  07/22/2012   *RADIOLOGY REPORT*  Clinical Data: Chest pain.  PORTABLE CHEST - 1 VIEW  Comparison: Two-view chest 03/22/2012.  Findings: The heart is enlarged.  Aeration is improved to at both lung bases with residual right lower lobe airspace disease or scarring.  There is some  pleural thickening on the right as well. This minimal left basilar atelectasis is noted.  Lung volumes are slightly improved.  IMPRESSION:  1.  Improved aeration in both lungs and improved lung volumes. 2.  Residual right lower lobe airspace disease or scarring may represent residual or recurrent pneumonia. 3.  Small right pleural effusion. 4.  Minimal left basilar atelectasis.   Original Report Authenticated By: Marin Roberts, M.D.     No diagnosis found.   Date: 07/22/2012- 1  Rate: 77  Rhythm: premature ventricular contractions (PVC) and indeterminate  QRS Axis: normal  Intervals: normal  ST/T Wave abnormalities: nonspecific ST changes  Conduction Disutrbances:none  Narrative Interpretation: poor baseline, I asked for repeat    Old EKG Reviewed: unchanged     Date: 07/22/2012- 2  Rate: 75  Rhythm: normal sinus rhythm  QRS Axis: normal  Intervals: normal  ST/T Wave abnormalities: nonspecific ST  changes  Conduction Disutrbances:none  Narrative Interpretation:   Old EKG Reviewed: unchanged    MDM  SHAYLON ADEN is a 72 y.o. male here with chest pain. Has significant cardiac history. Will give pain meds and will likely need cardiology consult.   9pm  Dr. Jon Billings from cardiology saw patient. Recommend second trop. Patient is in hospice and doesn't want a lot of workup. If second trop neg, he can f/u outpatient.   11:04 PM Second trop neg. Pain free in the ED. Stable for d/c.        Richardean Canal, MD 07/22/12 [phone]

## 2012-08-28 ENCOUNTER — Telehealth: Payer: Self-pay | Admitting: Neurology

## 2012-08-28 NOTE — Telephone Encounter (Signed)
Pt's wife Bjorn Loser needs the nurse to call her at [phone] she is trying to get's pt's medication and needs to speak to someone about this situation. She did call earlier and was at the drug store just to see if it had been filled, she did advise me she needs someone to help explain how to give the medication to him because she thought she was and has run out. Thanks

## 2012-08-29 NOTE — Telephone Encounter (Signed)
Directions are one half tab (25mg ) twice daily.  I called back.  Spoke with spouse.  She verified the bottle says patient should take one half twice daily, but says in error at times she gives him one full tab twice daily.  She will contact the pharmacy and see if they can get an override from ins for the refill, otherwise she may have to buy enough tabs to last until Rx is due to be filled again.

## 2012-08-29 NOTE — Telephone Encounter (Signed)
Referred to Lucille Passy for review. Thank you Shanda Bumps.Marland Kitchen

## 2012-08-31 ENCOUNTER — Other Ambulatory Visit: Payer: Self-pay

## 2012-08-31 MED ORDER — PRIMIDONE 50 MG PO TABS: 25.0000 mg | ORAL_TABLET | Freq: Two times a day (BID) | ORAL | Status: DC

## 2012-09-06 ENCOUNTER — Other Ambulatory Visit: Payer: Self-pay | Admitting: Internal Medicine

## 2012-09-15 ENCOUNTER — Telehealth: Payer: Self-pay | Admitting: Family Medicine

## 2012-09-15 NOTE — Telephone Encounter (Signed)
FYI pt will have respite stay at [facility] on 09-18-12 for 5 nights and return home on 09-22-12.

## 2012-10-10 ENCOUNTER — Other Ambulatory Visit: Payer: Self-pay | Admitting: Internal Medicine

## 2012-10-10 ENCOUNTER — Other Ambulatory Visit: Payer: Self-pay | Admitting: *Deleted

## 2012-10-10 ENCOUNTER — Telehealth: Payer: Self-pay | Admitting: *Deleted

## 2012-10-10 MED ORDER — TAMSULOSIN HCL 0.4 MG PO CAPS: 0.4000 mg | ORAL_CAPSULE | Freq: Every day | ORAL | Status: DC

## 2012-10-10 NOTE — Telephone Encounter (Signed)
[name] pts wife called requesting a Flomax refill.  Advised pt due for OV, Ronda declines OV states pt has to be transported by EMS to and from visits.  Further requests Dr Debby Bud to consult with Dr Sudie Grumbling at [facility] at [phone] to confirm pts condition.  Please advise

## 2012-10-10 NOTE — Telephone Encounter (Signed)
Ok for refill? 

## 2012-11-04 ENCOUNTER — Other Ambulatory Visit: Payer: Self-pay | Admitting: Family Medicine

## 2012-11-06 ENCOUNTER — Telehealth: Payer: Self-pay | Admitting: *Deleted

## 2012-11-06 NOTE — Telephone Encounter (Signed)
Okay for one year  

## 2012-11-06 NOTE — Telephone Encounter (Signed)
FYI - Patient will be staying at [facility] in [place] for a 5 night resp. Stay to give his wife a rest. Nelva Bush a nurse with Hospice phone number is [phone] Hospice number is [phone]

## 2012-11-06 NOTE — Telephone Encounter (Signed)
noted 

## 2012-11-06 NOTE — Telephone Encounter (Signed)
Can we refill this? 

## 2012-11-10 ENCOUNTER — Other Ambulatory Visit: Payer: Self-pay | Admitting: Internal Medicine

## 2012-11-15 ENCOUNTER — Other Ambulatory Visit: Payer: Self-pay | Admitting: Cardiology

## 2012-11-17 DIAGNOSIS — R1310 Dysphagia, unspecified: Secondary | ICD-10-CM

## 2012-11-17 DIAGNOSIS — R5381 Other malaise: Secondary | ICD-10-CM

## 2012-11-17 DIAGNOSIS — F411 Generalized anxiety disorder: Secondary | ICD-10-CM

## 2012-11-17 DIAGNOSIS — IMO0002 Reserved for concepts with insufficient information to code with codable children: Secondary | ICD-10-CM

## 2012-11-17 DIAGNOSIS — G20 Parkinson's disease: Secondary | ICD-10-CM

## 2012-11-17 DIAGNOSIS — R5383 Other fatigue: Secondary | ICD-10-CM

## 2012-12-04 ENCOUNTER — Other Ambulatory Visit: Payer: Self-pay | Admitting: Cardiovascular Disease

## 2012-12-06 ENCOUNTER — Other Ambulatory Visit: Payer: Self-pay | Admitting: Internal Medicine

## 2012-12-09 ENCOUNTER — Other Ambulatory Visit: Payer: Self-pay | Admitting: Neurology

## 2012-12-12 ENCOUNTER — Other Ambulatory Visit: Payer: Self-pay | Admitting: Family Medicine

## 2012-12-13 ENCOUNTER — Other Ambulatory Visit: Payer: Self-pay | Admitting: Cardiology

## 2012-12-27 ENCOUNTER — Other Ambulatory Visit: Payer: Self-pay | Admitting: Family Medicine

## 2012-12-27 ENCOUNTER — Other Ambulatory Visit (HOSPITAL_COMMUNITY): Payer: Self-pay | Admitting: Cardiovascular Disease

## 2013-01-01 ENCOUNTER — Telehealth: Payer: Self-pay | Admitting: Cardiology

## 2013-01-01 NOTE — Telephone Encounter (Signed)
Will forward for dr crenshaw review  

## 2013-01-01 NOTE — Telephone Encounter (Signed)
Spoke with rose, Aware of dr Ludwig Clarks recommendations.

## 2013-01-01 NOTE — Telephone Encounter (Signed)
DC imdur; hold lasix today and resume tomorrow; hold lasix for SBP < 95. Olga Millers

## 2013-01-01 NOTE — Telephone Encounter (Signed)
New Problem:  Rose from Hospice states pt's resting and lying BP is 78/40... Rose instructed the pt's wife to take to pt's BP before meds are given.Marland Kitchen Rose states she needs parameters for holding those meds per Dr. Jens Som  amioteron 200 mg 1 tablet once a day  furosimide 20 mg once a day  imdure extended release 30 mg 1 tablet 1 time a day

## 2013-01-06 ENCOUNTER — Other Ambulatory Visit: Payer: Self-pay | Admitting: Internal Medicine

## 2013-01-06 ENCOUNTER — Other Ambulatory Visit: Payer: Self-pay | Admitting: Neurology

## 2013-01-15 ENCOUNTER — Telehealth: Payer: Self-pay

## 2013-01-15 NOTE — Telephone Encounter (Signed)
Spoke with pts wife regarding the pt having an LDL drawn. Pts wife states that the pt is bedridden and has to be transported. States he is in the last stages of parkinsons and they will not be having the LDL done.

## 2013-01-16 ENCOUNTER — Other Ambulatory Visit: Payer: Self-pay | Admitting: Cardiology

## 2013-01-30 ENCOUNTER — Other Ambulatory Visit: Payer: Self-pay | Admitting: Family Medicine

## 2013-02-05 ENCOUNTER — Other Ambulatory Visit: Payer: Self-pay | Admitting: Family Medicine

## 2013-02-05 ENCOUNTER — Other Ambulatory Visit: Payer: Self-pay | Admitting: Internal Medicine

## 2013-02-05 ENCOUNTER — Other Ambulatory Visit (HOSPITAL_COMMUNITY): Payer: Self-pay | Admitting: Cardiovascular Disease

## 2013-02-09 ENCOUNTER — Other Ambulatory Visit: Payer: Self-pay | Admitting: Neurology

## 2013-02-14 ENCOUNTER — Telehealth: Payer: Self-pay | Admitting: Family Medicine

## 2013-02-14 MED ORDER — OMEPRAZOLE 40 MG PO CPDR: 40.0000 mg | DELAYED_RELEASE_CAPSULE | Freq: Every day | ORAL | Status: DC

## 2013-02-14 MED ORDER — OMEPRAZOLE 40 MG PO CPDR: 40.0000 mg | DELAYED_RELEASE_CAPSULE | Freq: Two times a day (BID) | ORAL | Status: AC

## 2013-02-14 NOTE — Telephone Encounter (Signed)
Pt wife would like pt to try new rx omeprazole 40 mg twice a day [facility]. Pt is on protonix 40 mg twice a day now. Pt will be going to [facility] on 02-16-13 for 5 day respite stay.

## 2013-02-14 NOTE — Telephone Encounter (Signed)
I sent script e-scribe and spoke with Lexa at Hospice.

## 2013-02-14 NOTE — Telephone Encounter (Signed)
Switch from Protonix to Omeprazole 40 mg bid. Call in one year supply

## 2013-03-05 ENCOUNTER — Other Ambulatory Visit: Payer: Self-pay | Admitting: Family Medicine

## 2013-03-15 DIAGNOSIS — G20 Parkinson's disease: Secondary | ICD-10-CM

## 2013-03-15 DIAGNOSIS — R5381 Other malaise: Secondary | ICD-10-CM

## 2013-03-15 DIAGNOSIS — IMO0002 Reserved for concepts with insufficient information to code with codable children: Secondary | ICD-10-CM

## 2013-03-15 DIAGNOSIS — R5383 Other fatigue: Secondary | ICD-10-CM

## 2013-03-15 DIAGNOSIS — F411 Generalized anxiety disorder: Secondary | ICD-10-CM

## 2013-03-15 DIAGNOSIS — R1310 Dysphagia, unspecified: Secondary | ICD-10-CM

## 2013-03-18 ENCOUNTER — Telehealth: Payer: Self-pay | Admitting: Family Medicine

## 2013-03-18 NOTE — Telephone Encounter (Signed)
[name] requesting refill of levothyroxine (SYNTHROID, LEVOTHROID) 75 MCG tablet #30, last filled 02/12/13

## 2013-03-19 ENCOUNTER — Telehealth: Payer: Self-pay

## 2013-03-19 MED ORDER — LEVOTHYROXINE SODIUM 75 MCG PO TABS: ORAL_TABLET | ORAL | Status: AC

## 2013-03-19 NOTE — Telephone Encounter (Signed)
I sent script e-scribe. 

## 2013-03-19 NOTE — Telephone Encounter (Signed)
Patient's wife call for refill of amiodarone, patient has not been here since 2013. I talked to Fredia Beets she said that the patient is in hospice and the hospice MD should refill his meds if we refill them he would have to come in for an appointment

## 2013-03-19 NOTE — Telephone Encounter (Signed)
Refill for one year 

## 2013-03-19 NOTE — Addendum Note (Signed)
Addended by: Aggie Hacker A on: 03/19/2013 01:57 PM   Modules accepted: Orders

## 2013-03-19 NOTE — Telephone Encounter (Signed)
Can we refill this? Looks like pt might need labs?

## 2013-03-21 ENCOUNTER — Telehealth: Payer: Self-pay

## 2013-03-21 ENCOUNTER — Other Ambulatory Visit: Payer: Self-pay | Admitting: Neurology

## 2013-03-21 MED ORDER — AMLODIPINE BESYLATE 2.5 MG PO TABS: 2.5000 mg | ORAL_TABLET | Freq: Every day | ORAL | Status: DC

## 2013-03-21 NOTE — Telephone Encounter (Signed)
Spoke with pt wife, the pt is in hospice not for a cardiac diagnosis and therefore hospice will not fill his meds. His wife reports he will pass any day now. Script to the pharm. She will call if she needs [place].

## 2013-03-28 ENCOUNTER — Other Ambulatory Visit: Payer: Self-pay | Admitting: Cardiology

## 2013-04-05 ENCOUNTER — Telehealth: Payer: Self-pay | Admitting: Family Medicine

## 2013-04-05 ENCOUNTER — Telehealth: Payer: Self-pay | Admitting: Cardiology

## 2013-04-05 NOTE — Telephone Encounter (Signed)
Unable to reach pt wife or leave a message

## 2013-04-05 NOTE — Telephone Encounter (Signed)
[facility] requesting script for tamsulosin (FLOMAX) 0.4 MG CAPS capsule

## 2013-04-05 NOTE — Telephone Encounter (Signed)
New message     Have a question about medications.    Wife has been given pt amiodarone 200mg  and amlodipine 2.5mg  daily.  Is she supposed to give pt both medications?

## 2013-04-06 NOTE — Telephone Encounter (Signed)
Can we refill this? 

## 2013-04-09 MED ORDER — TAMSULOSIN HCL 0.4 MG PO CAPS: ORAL_CAPSULE | ORAL | Status: AC

## 2013-04-09 NOTE — Telephone Encounter (Signed)
I sent script e-scribe. 

## 2013-04-09 NOTE — Telephone Encounter (Signed)
Refill for one year 

## 2013-04-16 NOTE — Telephone Encounter (Signed)
Returned call to [facility] nurse filling in for patient's regular Hospice nurse Abigail Butts.She stated wife was wanting to know if patient can stop amlodipine.Stated she didn't know patient was on amlodipine.Stated patient's arms are contracted and unable to do a B/P.Message sent to [place].

## 2013-04-16 NOTE — Telephone Encounter (Signed)
New message    On 1/28 amlodipine  was called in for patient.  They are not able to get blood pressure.     1. Should patient be on this medication & wife had no knowledge of new medication.    2. His arm are frozen into his chest .

## 2013-04-16 NOTE — Telephone Encounter (Signed)
Ok to [employer] amlodipine Kirk Ruths

## 2013-04-17 NOTE — Telephone Encounter (Signed)
Spoke with wendy, Aware of dr Jacalyn Lefevre recommendations.

## 2013-05-01 ENCOUNTER — Telehealth: Payer: Self-pay | Admitting: Family Medicine

## 2013-05-01 NOTE — Telephone Encounter (Signed)
Hospice is calling regarding [name], states 2 weeks ago they received an anonymous calling in reference to the daughter of the [name] was taking [name]'s meds instead of giving to the [name]. Hospice pcp donald hertweck made a visit to the home to investigate, hospice wants dr.fry to be aware and states they are monitoring the situation as close as possible and if dr. Sarajane Jews needs more information he can speak with dr. Tomasa Hosteller at [phone]

## 2013-05-02 NOTE — Telephone Encounter (Signed)
Noted  

## 2013-05-03 ENCOUNTER — Other Ambulatory Visit: Payer: Self-pay | Admitting: Cardiology

## 2013-05-04 NOTE — Telephone Encounter (Signed)
Is this ok to refill again? Please advise. Thanks, MI

## 2013-05-23 ENCOUNTER — Other Ambulatory Visit: Payer: Self-pay | Admitting: Neurology

## 2013-06-04 ENCOUNTER — Telehealth: Payer: Self-pay | Admitting: Family Medicine

## 2013-06-04 ENCOUNTER — Other Ambulatory Visit: Payer: Self-pay | Admitting: Family Medicine

## 2013-06-04 NOTE — Telephone Encounter (Signed)
Nurse from Hospice calling to inform pcp of transfer to [facility] place to monitor his anxiety and agitation.  Please note.

## 2013-06-05 NOTE — Telephone Encounter (Signed)
noted 

## 2013-06-15 ENCOUNTER — Telehealth: Payer: Self-pay | Admitting: Family Medicine

## 2013-06-15 NOTE — Telephone Encounter (Signed)
noted 

## 2013-06-15 NOTE — Telephone Encounter (Signed)
[facility] calling to inform Dr. Sarajane Jews that patient is being transferred to [facility].

## 2013-06-19 ENCOUNTER — Non-Acute Institutional Stay (SKILLED_NURSING_FACILITY): Payer: Medicare Other | Admitting: Internal Medicine

## 2013-06-19 ENCOUNTER — Encounter: Payer: Self-pay | Admitting: Internal Medicine

## 2013-06-19 DIAGNOSIS — R1310 Dysphagia, unspecified: Secondary | ICD-10-CM

## 2013-06-19 DIAGNOSIS — G20A1 Parkinson's disease without dyskinesia, without mention of fluctuations: Secondary | ICD-10-CM

## 2013-06-19 DIAGNOSIS — F0280 Dementia in other diseases classified elsewhere without behavioral disturbance: Secondary | ICD-10-CM

## 2013-06-19 DIAGNOSIS — E43 Unspecified severe protein-calorie malnutrition: Secondary | ICD-10-CM

## 2013-06-19 DIAGNOSIS — G20 Parkinson's disease: Secondary | ICD-10-CM

## 2013-06-19 NOTE — Progress Notes (Signed)
Patient ID: [name], male   DOB: 12/11/1940, 73 y.o.   MRN: [id number]  Provider:  Jonelle Sidle L. Mariea Clonts, D.O., C.M.D. Location:  [facility]   PCP: Laurey Morale, MD  Code Status: DNR, hospice care  Allergies  Allergen Reactions  . Ibuprofen Other (See Comments)    Stomach hurts    Chief Complaint  Patient presents with  . New Evaluation    new admission for continued hospice care upon transfer from [facility]    HPI: 72 y.o. male with h/o Parkinson's disease with dementia, chronic combined CHF, afib on amiodarone, mitral regurgitation, asthma, esophageal dysmotility, urinary retention, CKDIII, BPH, failure to thrive, and CAD s/p CABG 2011 (refused further caths) with chest pains was admitted for continued hospice care here at [facility].  He was previously at [facility].  His hospice diagnosis is Parkinson's disease.  He has dysphagia on pureed diet with meds crushed in applesauce.  He wears an allevyn dressing on his sacrum to help prevent breakdown.  He has protein calorie malnutrition with failure to thrive.  He is incontinent of bowel and bladder.  Hospice note reviewed.    ROS: Review of Systems  Constitutional: Negative for fever and malaise/fatigue.  HENT: Negative for congestion.   Eyes: Negative for blurred vision.  Respiratory: Negative for shortness of breath and wheezing.   Cardiovascular: Negative for chest pain.  Gastrointestinal: Negative for abdominal pain, blood in stool and melena.  Genitourinary: Negative for dysuria.       Incontinence  Musculoskeletal: Negative for falls.  Skin: Negative for rash.  Neurological: Positive for tremors. Negative for weakness.  Psychiatric/Behavioral: Positive for memory loss.     Past Medical History  Diagnosis Date  . PE (pulmonary embolism)     a. >20 years ago;  b. nl V:Q scan 10/2010  . DVT (deep venous thrombosis) 01/2007    bilateral  . Meningioma   . BPH (benign prostatic hyperplasia)   .  Personal history of colonic polyps   . Venereal wart   . Depression   . Asthma   . Chickenpox   . Thrombophlebitis   . GERD (gastroesophageal reflux disease)   . Parkinson disease   . Dementia   . CAD (coronary artery disease)     a. 08/2009 s/p CABG x 2 (LIMA->LAD, SVG-OM). b. Adm w/ CP 09/2011 - questionable functionality of LIMA graft but patient declined further catheterization (cath 10/05/11 -- ostial LAD disease, possible flap in prox L subclavian/unable to image LIMA well, atretic SVG-OM)  . Hyperlipidemia   . Stricture and stenosis of esophagus   . Diverticulosis of colon (without mention of hemorrhage)   . Mitral regurgitation     a. 08/2009 s/p MV repair 70mm Edwards ring and oversewing of LA appendage @ time of CABG;  b. cardiac MRI 09/2011 showing some disruption of lateral annuloplasty ring with "perivalvular" MR and some central MR   . Thrombus of left atrial appendage     a. Abnormal dye staining on cath 09/2011 - imaged by CT/MRI/TEE - felt to be surgically isolated from the [place]  . Anxiety   . Stroke   . Atrial fibrillation     a. Paroxysmal, not Coumadin candidate 2/2 Parkinson's/gait instability  . Brain tumor     "right side of my brain" (12/22/2011)  . Arthritis     "very little" (12/22/2011)  . Pulmonary embolism 01/2007  . Aspiration pneumonia 03/2008  . Hiatal hernia     PARKINSONS  TREMORS   Past Surgical History  Procedure Laterality Date  . Varicose vein surgery      left leg  . Mitral valve repair  08/2009  . Umbilical hernia repair    . Tee without cardioversion  10/06/2011    Procedure: TRANSESOPHAGEAL ECHOCARDIOGRAM (TEE);  Surgeon: Thayer Headings, MD;  Location[facility] ENDOSCOPY;  Service: Cardiovascular;  Laterality: N/A;  . Esophagogastroduodenoscopy  11/15/2011    Procedure: ESOPHAGOGASTRODUODENOSCOPY (EGD);  Surgeon: Ladene Artist, MD,FACG;  Location: [facility] ENDOSCOPY;  Service: Endoscopy;  Laterality: N/A;  . Tonsillectomy and adenoidectomy    .  Mastoidectomy      "right; I'm really not sure what they did to my mastoid" (12/22/2011)  . Inguinal hernia repair      right  . Lumbar disc surgery      "Dr. Joya Salm; herniated disc & remove bone spurs"  . Back surgery    . Coronary artery bypass graft  08/2009    CABG X2  . Joint replacement     Social History:   reports that he quit smoking about 60 years ago. His smoking use included Cigarettes. He has a 10 pack-year smoking history. He has never used smokeless tobacco. He reports that he does not drink alcohol or use illicit drugs.  Family History  Problem Relation Age of Onset  . Arthritis    . Diabetes    . Stroke Father   . Heart disease Mother     Medications: Patient's Medications  New Prescriptions   No medications on file  Previous Medications   AMIODARONE (PACERONE) 200 MG TABLET    TAKE 1 TABLET DAILY   BISACODYL (DULCOLAX) 10 MG SUPPOSITORY    Place 10 mg rectally as needed for moderate constipation.   CARBIDOPA-LEVODOPA (SINEMET IR) 25-100 MG PER TABLET    TAKE 1 TABLET EVERY MORNING 1 AT LUNCH AND 1 IN THE EVENING   GENTAMICIN (GARAMYCIN) 0.3 % OPHTHALMIC OINTMENT    Place 1 application into both eyes 4 (four) times daily.   LEVOTHYROXINE (SYNTHROID, LEVOTHROID) 75 MCG TABLET    TAKE 1 TABLET DAILY.   LORAZEPAM (ATIVAN) 1 MG TABLET    Take 1 mg by mouth every 6 (six) hours as needed for anxiety.   NITROGLYCERIN (NITROSTAT) 0.4 MG SL TABLET    Place 0.4 mg under the tongue every 5 (five) minutes as needed for chest pain (x3).   OMEPRAZOLE (PRILOSEC) 40 MG CAPSULE    Take 1 capsule (40 mg total) by mouth 2 (two) times daily.   PRIMIDONE (MYSOLINE) 50 MG TABLET    TAKE 1/2 TABLET BY MOUTH TWICE A DAY   PROMETHAZINE (PHENERGAN) 25 MG SUPPOSITORY    Place 25 mg rectally every 6 (six) hours as needed for nausea or vomiting.   QUETIAPINE (SEROQUEL) 50 MG TABLET    Take 50 mg by mouth every 6 (six) hours as needed.   SENNA (SENOKOT) 8.6 MG TABLET    Take 2 tablets by  mouth 2 (two) times daily.   SERTRALINE (ZOLOFT) 50 MG TABLET    Take 50 mg by mouth daily.   TAMSULOSIN (FLOMAX) 0.4 MG CAPS CAPSULE    TAKE 1 CAPSULE ONCE DAILY  Modified Medications   Modified Medication Previous Medication   MORPHINE (ROXANOL) 20 MG/ML CONCENTRATED SOLUTION morphine (ROXANOL) 20 MG/ML concentrated solution      Take 10 mg by mouth every 4 (four) hours as needed for severe pain or shortness of breath. For pain  Take 0.25 mLs (5 mg total) by mouth every 2 (two) hours as needed. For pain  Discontinued Medications   ASPIRIN EC 325 MG TABLET    Take 325 mg by mouth every morning.    DOCUSATE SODIUM 100 MG CAPS    Take 100 mg by mouth 2 (two) times daily.   FUROSEMIDE (LASIX) 20 MG TABLET    TAKE 1 TABLET DAILY.   HALOPERIDOL (HALDOL) 2 MG TABLET    Take 2 mg by mouth 2 (two) times daily.   HYDROCORTISONE CREAM 1 %    Apply 1 application topically 2 (two) times daily.   ISOSORBIDE MONONITRATE (IMDUR) 30 MG 24 HR TABLET    TAKE 1 TABLET EVERY MORNING.   ISOSORBIDE MONONITRATE (IMDUR) 60 MG 24 HR TABLET    Take 1 tablet (60 mg total) by mouth every morning.   LORAZEPAM (ATIVAN) 0.5 MG TABLET    Take 1-2 tablets (0.5-1 mg total) by mouth every 6 (six) hours.   MULTIPLE VITAMIN (MULITIVITAMIN WITH MINERALS) TABS    Take 1 tablet by mouth every morning.    POLYETHYLENE GLYCOL (MIRALAX / GLYCOLAX) PACKET    Take 17 g by mouth daily as needed. For constipation   PRAVASTATIN (PRAVACHOL) 40 MG TABLET    Take 40 mg by mouth every evening.    RISPERIDONE (RISPERDAL) 1 MG TABLET    Take 1 mg by mouth every evening.   TEMAZEPAM (RESTORIL) 30 MG CAPSULE    Take 30 mg by mouth at bedtime as needed. For sleep     Physical Exam: Filed Vitals:   06/19/13 1004  BP: 90/56  Pulse: 87  Temp: 97.5 F (36.4 C)  Resp: 18  Height: 6' (1.829 m)  Weight: 131 lb (59.421 kg)  Physical Exam  Constitutional: No distress.  Frail white male nad  HENT:  Head: Normocephalic and atraumatic.  Eyes:  EOM are normal. Pupils are equal, round, and reactive to light.  Neck: Neck supple. No JVD present.  Cardiovascular: Normal rate, regular rhythm, normal heart sounds and intact distal pulses.   Pulmonary/Chest: Effort normal and breath sounds normal. No respiratory distress.  Abdominal: Soft. Bowel sounds are normal. He exhibits no distension. There is no tenderness.  Musculoskeletal: Normal range of motion.  Neurological: He is alert.  Skin: Skin is warm and dry. There is pallor.    Labs reviewed: Basic Metabolic Panel:  Recent Labs  07/22/12 1832  NA 135  K 3.7  CL 101  CO2 25  GLUCOSE 98  BUN 6  CREATININE 0.86  CALCIUM 8.9  CBC:  Recent Labs  07/22/12 1832  WBC 6.7  NEUTROABS 5.0  HGB 10.4*  HCT 33.2*  MCV 81.6  PLT 211   Assessment/Plan 1. Parkinson's disease -on hospice care -end stage -requires assist with all ADLs--can do some fingerfoods  -goal is comfort  2. Dementia in Parkinson's disease -also end stage  3. Dysphagia -cont aspiration precautions and modified diet  4. Protein-calorie malnutrition, severe -due to his terminal PD and dementia state, has been on protein supplements  Functional status:  Dependent in ADLs due to end stage parkinson's disease  Family/ staff Communication:   Labs/tests ordered:  None today--pt's goals of care are comfort measures, do not hospitalize, he is a hospice pt with [facility]

## 2013-06-20 ENCOUNTER — Other Ambulatory Visit: Payer: Self-pay | Admitting: *Deleted

## 2013-06-20 MED ORDER — MORPHINE SULFATE (CONCENTRATE) 20 MG/ML PO SOLN: ORAL | Status: AC

## 2013-06-20 MED ORDER — LORAZEPAM 1 MG PO TABS: ORAL_TABLET | ORAL | Status: AC

## 2013-06-20 NOTE — Telephone Encounter (Signed)
[facility] [place] 

## 2013-06-28 ENCOUNTER — Telehealth: Payer: Self-pay | Admitting: Family Medicine

## 2013-06-28 NOTE — Telephone Encounter (Signed)
FYI Per Pt wife patient passed away on 2013/06/30

## 2013-06-28 NOTE — Telephone Encounter (Signed)
Thanks for the notice

## 2013-07-23 DEATH — deceased

## 2013-12-07 NOTE — Telephone Encounter (Signed)
Nothing was typed/tmj 

## 2014-01-31 ENCOUNTER — Encounter (HOSPITAL_COMMUNITY): Payer: Self-pay | Admitting: Internal Medicine
